# Patient Record
Sex: Female | Born: 1975 | Hispanic: Yes | Marital: Single | Smoking: Never smoker
Health system: Southern US, Community
[De-identification: ages and names within clinical notes are randomized; demographics above are authoritative.]

## PROBLEM LIST (undated history)

## (undated) DIAGNOSIS — N750 Cyst of Bartholin's gland: Secondary | ICD-10-CM

## (undated) DIAGNOSIS — Z789 Other specified health status: Secondary | ICD-10-CM

## (undated) HISTORY — PX: WISDOM TOOTH EXTRACTION: SHX21

## (undated) HISTORY — PX: NO PAST SURGERIES: SHX2092

## (undated) HISTORY — DX: Cyst of Bartholin's gland: N75.0

---

## 2017-12-30 ENCOUNTER — Emergency Department (HOSPITAL_COMMUNITY)
Admission: EM | Admit: 2017-12-30 | Discharge: 2017-12-30 | Disposition: A | Discharge status: Home / Self Care | Payer: Self-pay | Attending: Emergency Medicine | Admitting: Emergency Medicine

## 2017-12-30 ENCOUNTER — Encounter (HOSPITAL_COMMUNITY): Payer: Self-pay | Admitting: *Deleted

## 2017-12-30 ENCOUNTER — Other Ambulatory Visit: Payer: Self-pay

## 2017-12-30 DIAGNOSIS — N758 Other diseases of Bartholin's gland: Secondary | ICD-10-CM | POA: Insufficient documentation

## 2017-12-30 MED ORDER — SULFAMETHOXAZOLE-TRIMETHOPRIM 800-160 MG PO TABS: 1.0000 | ORAL_TABLET | Freq: Two times a day (BID) | ORAL | 0 refills | Status: AC

## 2017-12-30 NOTE — Discharge Instructions (Addendum)
Return if any problems.

## 2017-12-30 NOTE — ED Triage Notes (Signed)
Pt in c/o body aches and generalized not feeling well since Friday, pt thinks she has an abscess inside of her vagina that is causing her symptoms, reports nausea denies vomiting- triage completed via interpreter services

## 2017-12-30 NOTE — ED Provider Notes (Signed)
  [facility] EMERGENCY DEPARTMENT Provider Note   CSN: [id number] Arrival date & time: 12/30/17  0844     History   Chief Complaint Chief Complaint  Patient presents with  . Generalized Body Aches  . Vaginal Pain    HPI Jill [name] is a 42 y.o. female.  The history is provided by the patient. No language interpreter was used.  Vaginal Pain  This is a new problem. The current episode started yesterday. The problem occurs constantly. The problem has been gradually worsening. Nothing aggravates the symptoms. Nothing relieves the symptoms. She has tried nothing for the symptoms. The treatment provided no relief.  Pt complains of swelling to labia.  Pt reports she had a similar symptoms about a year ago that resolved.   History reviewed. No pertinent past medical history.  There are no active problems to display for this patient.   History reviewed. No pertinent surgical history.   OB History   None      Home Medications    Prior to Admission medications   Not on File    Family History History reviewed. No pertinent family history.  Social History Social History   Tobacco Use  . Smoking status: Never Smoker  . Smokeless tobacco: Never Used  Substance Use Topics  . Alcohol use: Not on file  . Drug use: Not on file     Allergies   Patient has no known allergies.   Review of Systems Review of Systems  Genitourinary: Positive for vaginal pain.  All other systems reviewed and are negative.    Physical Exam Updated Vital Signs BP 128/73 (BP Location: Right Arm)   Pulse 81   Temp 98.4 F (36.9 C) (Oral)   Resp 16   LMP 12/17/2017   SpO2 100%   Physical Exam  Constitutional: She appears well-developed and well-nourished.  HENT:  Head: Normocephalic.  Cardiovascular: Normal rate.  Pulmonary/Chest: Effort normal.  Genitourinary: No vaginal discharge found.  Genitourinary Comments: Swelling right labia,  No obvious  abscess.   Musculoskeletal: Normal range of motion.  Neurological: She is alert.  Skin: Skin is warm.  Psychiatric: She has a normal mood and affect.  Nursing note and vitals reviewed.    ED Treatments / Results  Labs (all labs ordered are listed, but only abnormal results are displayed) Labs Reviewed - No data to display  EKG None  Radiology No results found.  Procedures Procedures (including critical care time)  Medications Ordered in ED Medications - No data to display   Initial Impression / Assessment and Plan / ED Course  I have reviewed the triage vital signs and the nursing notes.  Pertinent labs & imaging results that were available during my care of the patient were reviewed by me and considered in my medical decision making (see chart for details).     MDM  Pt advised warm compresses.  Pt given rx for Bactrim DX.  Pt advised to recheck in 2 days.   Final Clinical Impressions(s) / ED Diagnoses   Final diagnoses:  Bartholin's gland infection    ED Discharge Orders        Ordered    sulfamethoxazole-trimethoprim (BACTRIM DS,SEPTRA DS) 800-160 MG tablet  2 times daily     12/30/17 0955    An After Visit Summary was printed and given to the patient.    Fransico Meadow, [place] 12/30/17 5364    Jola Schmidt, MD 12/31/17 [phone]

## 2017-12-31 ENCOUNTER — Encounter (HOSPITAL_COMMUNITY): Payer: Self-pay | Admitting: *Deleted

## 2017-12-31 ENCOUNTER — Inpatient Hospital Stay (HOSPITAL_COMMUNITY)
Admission: AD | Admit: 2017-12-31 | Discharge: 2017-12-31 | Disposition: A | Discharge status: Home / Self Care | Payer: Self-pay | Source: Ambulatory Visit | Attending: Family Medicine | Admitting: Family Medicine

## 2017-12-31 DIAGNOSIS — N758 Other diseases of Bartholin's gland: Secondary | ICD-10-CM | POA: Insufficient documentation

## 2017-12-31 DIAGNOSIS — N750 Cyst of Bartholin's gland: Secondary | ICD-10-CM | POA: Insufficient documentation

## 2017-12-31 LAB — POCT PREGNANCY, URINE: PREG TEST UR: NEGATIVE

## 2017-12-31 LAB — URINALYSIS, ROUTINE W REFLEX MICROSCOPIC
Bilirubin Urine: NEGATIVE
Glucose, UA: NEGATIVE mg/dL
Ketones, ur: NEGATIVE mg/dL
Nitrite: NEGATIVE
Protein, ur: NEGATIVE mg/dL
Specific Gravity, Urine: 1.015 (ref 1.005–1.030)
WBC, UA: >50 WBC/hpf — ABNORMAL HIGH (ref 0–5)
pH: 5 (ref 5.0–8.0)

## 2017-12-31 LAB — WET PREP, GENITAL
Sperm: NONE SEEN
Trich, Wet Prep: NONE SEEN
Yeast Wet Prep HPF POC: NONE SEEN

## 2017-12-31 MED ORDER — HYDROMORPHONE HCL 1 MG/ML IJ SOLN
1.0000 mg | Freq: Once | INTRAMUSCULAR | Status: AC
  Administered 2017-12-31: 1 mg via INTRAMUSCULAR
  Filled 2017-12-31: qty 1

## 2017-12-31 NOTE — MAU Provider Note (Signed)
Chief Complaint: Vaginal Pain; Fever; and Chills   First Provider Initiated Contact with Patient 12/31/17 2126     SUBJECTIVE HPI: Jill [name] is a 42 y.o. [phone] not currently pregnant who presents to maternity admissions reporting increased vaginal pain, fever and chills. She reports being seen yesterday at [facility] ED for symptoms and was diagnosed with a bartholin's cyst. She reports she was placed on Bactrim and pain got worse after starting medication. She reports symptoms of vaginal pain started on Friday. She reports that her right labia is swollen and it is difficult for her to walk. States fever is associated with symptoms, reports having a temp of 100 at home and took ibuprofen and temp went down.   Viria- spanish speaking interpreter at bedside for assessment and management.   History reviewed. No pertinent past medical history. History reviewed. No pertinent surgical history. Social History   Socioeconomic History  . Marital status: Single    Spouse name: Not on file  . Number of children: Not on file  . Years of education: Not on file  . Highest education level: Not on file  Occupational History  . Not on file  Social Needs  . Financial resource strain: Not on file  . Food insecurity:    Worry: Not on file    Inability: Not on file  . Transportation needs:    Medical: Not on file    Non-medical: Not on file  Tobacco Use  . Smoking status: Never Smoker  . Smokeless tobacco: Never Used  Substance and Sexual Activity  . Alcohol use: Never    Frequency: Never  . Drug use: Never  . Sexual activity: Yes    Birth control/protection: None  Lifestyle  . Physical activity:    Days per week: Not on file    Minutes per session: Not on file  . Stress: Not on file  Relationships  . Social connections:    Talks on phone: Not on file    Gets together: Not on file    Attends religious service: Not on file    Active member of club or organization: Not on file     Attends meetings of clubs or organizations: Not on file    Relationship status: Not on file  . Intimate partner violence:    Fear of current or ex partner: Not on file    Emotionally abused: Not on file    Physically abused: Not on file    Forced sexual activity: Not on file  Other Topics Concern  . Not on file  Social History Narrative  . Not on file   No current facility-administered medications on file prior to encounter.    Current Outpatient Medications on File Prior to Encounter  Medication Sig Dispense Refill  . sulfamethoxazole-trimethoprim (BACTRIM DS,SEPTRA DS) 800-160 MG tablet Take 1 tablet by mouth 2 (two) times daily for 7 days. 20 tablet 0   No Known Allergies  ROS:  Review of Systems  Constitutional: Positive for chills and fever. Negative for diaphoresis and fatigue.  Respiratory: Negative.   Cardiovascular: Negative.   Gastrointestinal: Negative.   Genitourinary: Positive for vaginal pain. Negative for difficulty urinating, dysuria, frequency, urgency, vaginal bleeding and vaginal discharge.   I have reviewed patient's Past Medical Hx, Surgical Hx, Family Hx, Social Hx, medications and allergies.   Physical Exam   Patient Vitals for the past 24 hrs:  BP Temp Temp src Pulse Resp SpO2  12/31/17 2309 (!) 97/51 98 F (  36.7 C) Oral 75 18 100 %  12/31/17 2022 (!) 116/58 99 F (37.2 C) - 93 16 -   Constitutional: Well-developed, well-nourished female. Cardiovascular: normal rate Respiratory: normal effort Neurologic: Alert and oriented x 4.   PELVIC EXAM: Enlarged abscess palpated in front of the hymenal ring, noted to be 3x4cm. Erythema noted around abscess. Abscess mobile and able to be drained.   LAB RESULTS Results for orders placed or performed during the hospital encounter of 12/31/17 (from the past 24 hour(s))  Pregnancy, urine POC     Status: None   Collection Time: 12/31/17  8:35 PM  Result Value Ref Range   Preg Test, Ur NEGATIVE NEGATIVE   Urinalysis, Routine w reflex microscopic     Status: Abnormal   Collection Time: 12/31/17  8:39 PM  Result Value Ref Range   Color, Urine YELLOW YELLOW   APPearance CLOUDY (A) CLEAR   Specific Gravity, Urine 1.015 1.005 - 1.030   pH 5.0 5.0 - 8.0   Glucose, UA NEGATIVE NEGATIVE mg/dL   Hgb urine dipstick MODERATE (A) NEGATIVE   Bilirubin Urine NEGATIVE NEGATIVE   Ketones, ur NEGATIVE NEGATIVE mg/dL   Protein, ur NEGATIVE NEGATIVE mg/dL   Nitrite NEGATIVE NEGATIVE   Leukocytes, UA LARGE (A) NEGATIVE   RBC / HPF 11-20 0 - 5 RBC/hpf   WBC, UA >50 (H) 0 - 5 WBC/hpf   Bacteria, UA RARE (A) NONE SEEN   Squamous Epithelial / LPF 21-50 0 - 5   Mucus PRESENT   Wet prep, genital     Status: Abnormal   Collection Time: 12/31/17  9:41 PM  Result Value Ref Range   Yeast Wet Prep HPF POC NONE SEEN NONE SEEN   Trich, Wet Prep NONE SEEN NONE SEEN   Clue Cells Wet Prep HPF POC PRESENT (A) NONE SEEN   WBC, Wet Prep HPF POC MODERATE (A) NONE SEEN   Sperm NONE SEEN     MAU Management/MDM: Orders Placed This Encounter  Procedures  . Wet prep, genital  . Urinalysis, Routine w reflex microscopic  . Pregnancy, urine POC  . Discharge patient Discharge disposition: 01-Home or Self Care; Discharge patient date: 12/31/2017   Meds ordered this encounter  Medications  . HYDROmorphone (DILAUDID) injection 1 mg   Consult Dr Nehemiah Settle for assessment and I&D.  PROCEDURE:  Bartholin Cyst I&D and Word Catheter Placement Enlarged abscess palpated in front of the hymenal ring around 5 or 7 o' clock. Discussed complications and possible outcomes of procedure including recurrence of cyst, scarring leading to infecton, bleeding, dyspareunia, distortion of anatomy.  Patient was examined in the dorsal lithotomy position and mass was identified.  The area was prepped with Iodine and draped in a sterile manner. 1% Lidocaine (3 ml) was then used to infiltrate area on top of the cyst, behind the hymenal ring.  A 7 mm  incision was made using a sterile scapel. Upon palpation of the mass, a moderate amount of bloody purulent drainage was expressed through the incision. A hemostat was used to break up loculations, which resulted in expression of more bloody purulent drainage. The open cyst was then copiously irrigated with normal saline, and a Word catheter was placed. 3 ml of sterile water was used to inflate the catheter balloon.  The end of the catheter was tucked into the vagina.  Patient tolerated the procedure well, reported feeling "a lot better." - Bactrim DS bid x 7 days for treatment - Recommended Sitz baths bid and  Motrin can be used prn for pain. She was told to call to be examined if she experiences increasing swelling, pain, vaginal discharge, or fever.   - She was instructed to wear a peripad to absorb discharge, and to maintain pelvic rest while the Word catheter is in place.  -The catheter will be left in place for at least two to four weeks to promote formation of an epithelialized tract for permanent drainage of glandular secretions.  - She will need an appointment [facility]6 weeks from now for removal of word catheter.   Pt discharged with strict instructions and precautions for management.   ASSESSMENT 1. Bartholin gland cyst   2. Bartholin's gland infection     PLAN Discharge home Follow up in office in 4 weeks for removal of word catheter  Continue Bactrim antibiotics  Pt stable at time of discharge  Motrin and Tylenol PRN as needed for pain management   Follow-up [facility] for [place]. Schedule an appointment as soon as possible for a visit in 3 week(s).   Specialty:  Obstetrics and Gynecology Why:  Follow up in 3 weeks  Contact information: [place] [phone]          Allergies as of 12/31/2017   No Known Allergies     Medication List    TAKE these medications    sulfamethoxazole-trimethoprim 800-160 MG tablet Commonly known as:  BACTRIM DS,SEPTRA DS Take 1 tablet by mouth 2 (two) times daily for 7 days.      Darrol Poke  Certified Nurse-Midwife 12/31/2017  11:04 PM

## 2017-12-31 NOTE — MAU Note (Signed)
Pt presents to MAU with complaints of an increase in vaginal pain. Pt was evaluated yesterday at [facility] ED and given antibiotic Bactrim. Rates pain 10.

## 2017-12-31 NOTE — Discharge Instructions (Signed)
Quiste o absceso de [place] (Bartholin Cyst or Abscess) Un quiste de Bartolino es un saco lleno de lquido que se forma en una glndula de [place]. Las glndulas de Bartolino son pequeas glndulas que se encuentran entre los pliegues de la piel (labios vaginales) a ambos lados del orificio inferior de la vagina. Este tipo de quiste forma un bulto al costado de la vagina. Un quiste que no es grande ni est infectado puede no causar problemas. Sin embargo, si el lquido del quiste se [place], este puede convertirse en un absceso. Un absceso puede causar [employer]. [place] los medicamentos solamente como se lo haya indicado el mdico.  Si le recetaron antibiticos, asegrese de terminarlos, incluso si comienza a sentirse mejor.  Pngase compresas hmedas tibias en la zona o hgase baos de asiento tibios que le cubran la zona plvica. Haga esto varias veces al da o como se lo haya indicado el mdico.  No apriete el quiste. No lo presione con fuerza.  No tenga relaciones sexuales hasta que el quiste haya desaparecido.  Si le abrieron el quiste o el absceso, tal vez le hayan colocado un pequeo trozo de gasa o un drenaje en la zona para permitir la salida de lquido del [place]. No se saque la gasa ni el drenaje hasta que el mdico se lo permita.  No use tampones. Use protectores femeninos segn sea necesario por la prdida de lquido o sangre.  Concurra a todas las visitas de control como se lo haya indicado el mdico. Esto es importante. SOLICITE AYUDA SI:  El dolor, la inflamacin (hinchazn) o el enrojecimiento en el rea del quiste empeoran.  Observa que sale lquido o pus del quiste.  Tiene fiebre. Esta informacin no tiene Marine scientist el consejo del mdico. Asegrese de hacerle al mdico cualquier pregunta que tenga. Document Released: 07/14/2010 Document Revised: 10/26/2014 Document Reviewed: 01/25/2014 Elsevier Interactive Patient Education   2018 [employer].

## 2018-01-18 ENCOUNTER — Encounter (HOSPITAL_COMMUNITY): Payer: Self-pay | Admitting: *Deleted

## 2018-01-18 ENCOUNTER — Inpatient Hospital Stay (HOSPITAL_COMMUNITY)
Admission: AD | Admit: 2018-01-18 | Discharge: 2018-01-18 | Disposition: A | Discharge status: Home / Self Care | Payer: Self-pay | Source: Ambulatory Visit | Attending: Obstetrics and Gynecology | Admitting: Obstetrics and Gynecology

## 2018-01-18 DIAGNOSIS — N898 Other specified noninflammatory disorders of vagina: Secondary | ICD-10-CM | POA: Insufficient documentation

## 2018-01-18 DIAGNOSIS — N751 Abscess of Bartholin's gland: Secondary | ICD-10-CM | POA: Insufficient documentation

## 2018-01-18 HISTORY — DX: Other specified health status: Z78.9

## 2018-01-18 NOTE — MAU Note (Signed)
Jill [name] is a 42 y.o. at Unknown here in MAU reporting:  Patient endorses being here 2-3 weeks ago for a vaginal boil that was drained/"removed" States something has fell out of the site and she is concerned, which Is what brought her in Denies pain; just some burning in the area Vitals:   01/18/18 1854  BP: 115/65  Pulse: 73  Resp: 17  Temp: 97.6 F (36.4 C)  SpO2: 100%      Lab orders placed from triage: none

## 2018-01-18 NOTE — MAU Note (Signed)
Urine in lab 

## 2018-01-18 NOTE — MAU Provider Note (Signed)
History  42 year old female in today with complaints of vaginal irritation from [facility] catheter placement from Bartholin's abscess that was done on July 9.  Patient requesting that Word catheter be removed today if possible.  Also requesting Pap smear because she has not had one done in years.  CSN: [id number]  Arrival date & time 01/18/18  1817   None     Chief Complaint  Patient presents with  . boil    HPI  Past Medical History:  Diagnosis Date  . Medical history non-contributory     Past Surgical History:  Procedure Laterality Date  . NO PAST SURGERIES      No family history on file.  Social History   Tobacco Use  . Smoking status: Never Smoker  . Smokeless tobacco: Never Used  Substance Use Topics  . Alcohol use: Never    Frequency: Never  . Drug use: Never    OB History    Gravida  3   Para  3   Term  3   Preterm      AB      Living  3     SAB      TAB      Ectopic      Multiple      Live Births              Review of Systems  Constitutional: Negative.   HENT: Negative.   Eyes: Negative.   Respiratory: Negative.   Cardiovascular: Negative.   Endocrine: Negative.   Genitourinary: Positive for vaginal pain.  Skin: Negative.   Allergic/Immunologic: Negative.   Neurological: Negative.   Hematological: Negative.   Psychiatric/Behavioral: Negative.     Allergies  Patient has no known allergies.  Home Medications    BP 115/65 (BP Location: Right Arm)   Pulse 73   Temp 97.6 F (36.4 C) (Oral)   Resp 17   Wt 146 lb (66.2 kg)   SpO2 100% Comment: ra  Physical Exam  Constitutional: She is oriented to person, place, and time. She appears well-developed and well-nourished.  HENT:  Head: Normocephalic.  Neck: Normal range of motion.  Cardiovascular: Normal rate, regular rhythm, normal heart sounds and intact distal pulses.  Pulmonary/Chest: Effort normal and breath sounds normal.  Abdominal: Soft. Bowel sounds are normal.   Genitourinary:  Genitourinary Comments: Area around Word catheter well-healed  Musculoskeletal: Normal range of motion.  Neurological: She is alert and oriented to person, place, and time.  Skin: Skin is warm and dry.  Psychiatric: She has a normal mood and affect. Her behavior is normal. Judgment and thought content normal.    MAU Course  Procedures (including critical care time)  Labs Reviewed - No data to display No results found.   1. Bartholin's gland abscess       MDM  Vital signs are stable.  Area around [facility] catheter well-healed will remove at patient request also message sent to [facility] told that patient needs Pap smear done with her visit on August.  August 5.  Will discharge home.

## 2018-01-27 ENCOUNTER — Ambulatory Visit (INDEPENDENT_AMBULATORY_CARE_PROVIDER_SITE_OTHER): Payer: Self-pay | Admitting: Family Medicine

## 2018-01-27 ENCOUNTER — Encounter: Payer: Self-pay | Admitting: Family Medicine

## 2018-01-27 VITALS — BP 98/50 | HR 69 | Ht 58.27 in | Wt 146.3 lb

## 2018-01-27 DIAGNOSIS — N750 Cyst of Bartholin's gland: Secondary | ICD-10-CM

## 2018-01-27 NOTE — Patient Instructions (Addendum)
You can schedule a free pap smear by calling [phone] to schedule an appointment at the next free pap smear clinic.   Puede ir al [employer] en [address] para obtener contraconceptivo.

## 2018-01-27 NOTE — Progress Notes (Signed)
   Subjective:    Patient ID: Jill [name], female    DOB: 03/25/1976, 42 y.o.   MRN: [id number]  HPI Patient seen for follow up of bartholin cyst on right side. She had ward catheter placed on 7/9. Removed 7/27. No pain, drainage.    Review of Systems     Objective:   Physical Exam  Constitutional: She appears well-developed and well-nourished.  Cardiovascular: Normal rate.  Pulmonary/Chest: Effort normal.  Abdominal: Soft.  Genitourinary: There is no rash, tenderness, lesion or injury on the right labia. There is no rash, tenderness, lesion or injury on the left labia.      Assessment & Plan:  1. Bartholin's cyst Well healed. No problems Health department for contraception as patient is self pay.  Pt referred to Jill [name] program for [name].

## 2018-06-25 NOTE — L&D Delivery Note (Signed)
Delivery Note At 3:50 PM a viable and healthy female was delivered via Vaginal, Spontaneous (Presentation: LOA ).  APGAR: 9, 9; weight pending.   Placenta status: spontaneous, intact .  Cord: 3 vessel with the following complications: none.    Anesthesia:  IV pain meds Episiotomy: None Lacerations:   None intact Suture Repair: n/a Est. Blood Loss (mL): 250  Mom to postpartum.  Baby to Couplet care / Skin to Skin.  Jill [name] is a 43 y.o. female [phone] with IUP at [redacted]w[redacted]d admitted for SOL .  She progressed with AROM to complete and pushed less than 10 minutes to deliver.  Cord clamping delayed by several minutes then clamped by CNM and cut by CNM.  Placenta intact and spontaneous, bleeding minimal.  Intact perineum  Mom and baby stable prior to transfer to postpartum. She plans on breastfeeding.    [name] 03/15/2019, 4:59 PM

## 2018-10-03 ENCOUNTER — Encounter (HOSPITAL_COMMUNITY): Payer: Self-pay | Admitting: *Deleted

## 2018-10-06 ENCOUNTER — Ambulatory Visit (HOSPITAL_COMMUNITY): Payer: Self-pay

## 2018-10-08 ENCOUNTER — Other Ambulatory Visit (HOSPITAL_COMMUNITY): Payer: Self-pay

## 2018-10-08 ENCOUNTER — Ambulatory Visit (HOSPITAL_COMMUNITY): Payer: Self-pay

## 2018-10-10 ENCOUNTER — Ambulatory Visit (HOSPITAL_COMMUNITY): Payer: Self-pay | Admitting: Obstetrics and Gynecology

## 2018-10-10 ENCOUNTER — Ambulatory Visit (HOSPITAL_COMMUNITY): Payer: Self-pay | Attending: Obstetrics and Gynecology

## 2018-10-10 ENCOUNTER — Ambulatory Visit (HOSPITAL_COMMUNITY): Payer: Self-pay | Admitting: Nurse Practitioner

## 2018-10-10 ENCOUNTER — Other Ambulatory Visit: Payer: Self-pay

## 2018-10-10 ENCOUNTER — Ambulatory Visit (HOSPITAL_COMMUNITY): Payer: Self-pay

## 2018-10-10 DIAGNOSIS — Z3A18 18 weeks gestation of pregnancy: Secondary | ICD-10-CM | POA: Insufficient documentation

## 2018-10-10 DIAGNOSIS — Z1371 Encounter for nonprocreative screening for genetic disease carrier status: Secondary | ICD-10-CM | POA: Insufficient documentation

## 2018-10-10 DIAGNOSIS — O09522 Supervision of elderly multigravida, second trimester: Secondary | ICD-10-CM | POA: Insufficient documentation

## 2018-10-10 NOTE — Progress Notes (Signed)
Error

## 2018-10-20 ENCOUNTER — Ambulatory Visit (HOSPITAL_COMMUNITY): Payer: Self-pay | Admitting: Nurse Practitioner

## 2018-10-23 LAB — HEMOGLOBINOPATHY EVALUATION
HGB C: 0 %
HGB S: 0 %
HGB VARIANT: 0 %
Hemoglobin A2 Quantitation: 2.1 % (ref 1.8–3.2)
Hemoglobin F Quantitation: 0.9 % (ref 0.0–2.0)
Hgb A: 97 % (ref 96.4–98.8)

## 2018-10-23 LAB — AFP, SERUM, OPEN SPINA BIFIDA
AFP MoM: 0.74
AFP Value: 33.1 ng/mL
Gest. Age on Collection Date: 18.2 weeks
Maternal Age At EDD: 41.7 yr
OSBR Risk 1 IN: 10000
Test Results:: NEGATIVE
Weight: 146 [lb_av]

## 2018-10-23 LAB — INHERITEST CORE(CF97,SMA,FRAX)

## 2018-10-23 LAB — MATERNIT 21 PLUS CORE, BLOOD
Fetal Fraction: 7
Result (T21): NEGATIVE
Trisomy 13 (Patau syndrome): NEGATIVE
Trisomy 18 (Edwards syndrome): NEGATIVE
Trisomy 21 (Down syndrome): NEGATIVE

## 2019-03-15 ENCOUNTER — Inpatient Hospital Stay (HOSPITAL_COMMUNITY)
Admission: AD | Admit: 2019-03-15 | Discharge: 2019-03-16 | DRG: 807 | Disposition: A | Discharge status: Home / Self Care | Payer: Medicaid Other | Attending: Obstetrics and Gynecology | Admitting: Obstetrics and Gynecology

## 2019-03-15 ENCOUNTER — Encounter (HOSPITAL_COMMUNITY): Payer: Self-pay

## 2019-03-15 ENCOUNTER — Other Ambulatory Visit: Payer: Self-pay

## 2019-03-15 DIAGNOSIS — O26893 Other specified pregnancy related conditions, third trimester: Secondary | ICD-10-CM | POA: Diagnosis present

## 2019-03-15 DIAGNOSIS — O480 Post-term pregnancy: Secondary | ICD-10-CM | POA: Diagnosis not present

## 2019-03-15 DIAGNOSIS — Z3A40 40 weeks gestation of pregnancy: Secondary | ICD-10-CM

## 2019-03-15 DIAGNOSIS — Z20828 Contact with and (suspected) exposure to other viral communicable diseases: Secondary | ICD-10-CM | POA: Diagnosis present

## 2019-03-15 LAB — TYPE AND SCREEN
ABO/RH(D): A POS
Antibody Screen: NEGATIVE

## 2019-03-15 LAB — CBC
HCT: 39.1 % (ref 36.0–46.0)
Hemoglobin: 13.7 g/dL (ref 12.0–15.0)
MCH: 31.9 pg (ref 26.0–34.0)
MCHC: 35 g/dL (ref 30.0–36.0)
MCV: 91.1 fL (ref 80.0–100.0)
Platelets: 304 10*3/uL (ref 150–400)
RBC: 4.29 MIL/uL (ref 3.87–5.11)
RDW: 13.4 % (ref 11.5–15.5)
WBC: 6.3 10*3/uL (ref 4.0–10.5)
nRBC: 0 % (ref 0.0–0.2)

## 2019-03-15 LAB — SARS CORONAVIRUS 2 BY RT PCR (HOSPITAL ORDER, PERFORMED IN ~~LOC~~ HOSPITAL LAB): SARS Coronavirus 2: NEGATIVE

## 2019-03-15 LAB — ABO/RH: ABO/RH(D): A POS

## 2019-03-15 MED ORDER — LACTATED RINGERS IV SOLN: INTRAVENOUS | Status: DC

## 2019-03-15 MED ORDER — ACETAMINOPHEN 325 MG PO TABS
650.0000 mg | ORAL_TABLET | ORAL | Status: DC | PRN
  Administered 2019-03-16 (×2): 650 mg via ORAL
  Filled 2019-03-15 (×2): qty 2

## 2019-03-15 MED ORDER — LACTATED RINGERS IV SOLN: 500.0000 mL | INTRAVENOUS | Status: DC | PRN

## 2019-03-15 MED ORDER — WITCH HAZEL-GLYCERIN EX PADS: 1.0000 "application " | MEDICATED_PAD | CUTANEOUS | Status: DC | PRN

## 2019-03-15 MED ORDER — BENZOCAINE-MENTHOL 20-0.5 % EX AERO: 1.0000 "application " | INHALATION_SPRAY | CUTANEOUS | Status: DC | PRN

## 2019-03-15 MED ORDER — LIDOCAINE HCL (PF) 1 % IJ SOLN: 30.0000 mL | INTRAMUSCULAR | Status: DC | PRN

## 2019-03-15 MED ORDER — DIPHENHYDRAMINE HCL 25 MG PO CAPS: 25.0000 mg | ORAL_CAPSULE | Freq: Four times a day (QID) | ORAL | Status: DC | PRN

## 2019-03-15 MED ORDER — ONDANSETRON HCL 4 MG/2ML IJ SOLN: 4.0000 mg | INTRAMUSCULAR | Status: DC | PRN

## 2019-03-15 MED ORDER — FLEET ENEMA 7-19 GM/118ML RE ENEM: 1.0000 | ENEMA | RECTAL | Status: DC | PRN

## 2019-03-15 MED ORDER — ACETAMINOPHEN 325 MG PO TABS: 650.0000 mg | ORAL_TABLET | ORAL | Status: DC | PRN

## 2019-03-15 MED ORDER — FENTANYL CITRATE (PF) 100 MCG/2ML IJ SOLN
100.0000 ug | INTRAMUSCULAR | Status: DC | PRN
  Administered 2019-03-15: 100 ug via INTRAVENOUS

## 2019-03-15 MED ORDER — COCONUT OIL OIL: 1.0000 "application " | TOPICAL_OIL | Status: DC | PRN

## 2019-03-15 MED ORDER — OXYTOCIN BOLUS FROM INFUSION
500.0000 mL | Freq: Once | INTRAVENOUS | Status: AC
  Administered 2019-03-15: 500 mL via INTRAVENOUS

## 2019-03-15 MED ORDER — SENNOSIDES-DOCUSATE SODIUM 8.6-50 MG PO TABS
2.0000 | ORAL_TABLET | ORAL | Status: DC
  Filled 2019-03-15: qty 2

## 2019-03-15 MED ORDER — SIMETHICONE 80 MG PO CHEW: 80.0000 mg | CHEWABLE_TABLET | ORAL | Status: DC | PRN

## 2019-03-15 MED ORDER — OXYTOCIN 40 UNITS IN NORMAL SALINE INFUSION - SIMPLE MED
2.5000 [IU]/h | INTRAVENOUS | Status: DC
  Administered 2019-03-15: 16:00:00 2.5 [IU]/h via INTRAVENOUS
  Filled 2019-03-15: qty 1000

## 2019-03-15 MED ORDER — IBUPROFEN 600 MG PO TABS
600.0000 mg | ORAL_TABLET | Freq: Four times a day (QID) | ORAL | Status: DC
  Administered 2019-03-16 (×3): 600 mg via ORAL
  Filled 2019-03-15 (×4): qty 1

## 2019-03-15 MED ORDER — OXYCODONE-ACETAMINOPHEN 5-325 MG PO TABS: 1.0000 | ORAL_TABLET | ORAL | Status: DC | PRN

## 2019-03-15 MED ORDER — TETANUS-DIPHTH-ACELL PERTUSSIS 5-2.5-18.5 LF-MCG/0.5 IM SUSP: 0.5000 mL | Freq: Once | INTRAMUSCULAR | Status: DC

## 2019-03-15 MED ORDER — ZOLPIDEM TARTRATE 5 MG PO TABS: 5.0000 mg | ORAL_TABLET | Freq: Every evening | ORAL | Status: DC | PRN

## 2019-03-15 MED ORDER — ONDANSETRON HCL 4 MG/2ML IJ SOLN: 4.0000 mg | Freq: Four times a day (QID) | INTRAMUSCULAR | Status: DC | PRN

## 2019-03-15 MED ORDER — OXYCODONE-ACETAMINOPHEN 5-325 MG PO TABS: 2.0000 | ORAL_TABLET | ORAL | Status: DC | PRN

## 2019-03-15 MED ORDER — ONDANSETRON HCL 4 MG PO TABS: 4.0000 mg | ORAL_TABLET | ORAL | Status: DC | PRN

## 2019-03-15 MED ORDER — FENTANYL CITRATE (PF) 100 MCG/2ML IJ SOLN
INTRAMUSCULAR | Status: AC
  Filled 2019-03-15: qty 2

## 2019-03-15 MED ORDER — DIBUCAINE (PERIANAL) 1 % EX OINT: 1.0000 "application " | TOPICAL_OINTMENT | CUTANEOUS | Status: DC | PRN

## 2019-03-15 MED ORDER — SOD CITRATE-CITRIC ACID 500-334 MG/5ML PO SOLN: 30.0000 mL | ORAL | Status: DC | PRN

## 2019-03-15 MED ORDER — PRENATAL MULTIVITAMIN CH
1.0000 | ORAL_TABLET | Freq: Every day | ORAL | Status: DC
  Administered 2019-03-16: 1 via ORAL
  Filled 2019-03-15: qty 1

## 2019-03-15 NOTE — H&P (Signed)
Jill [name] is a 43 y.o. female [phone] at [redacted]w[redacted]d low risk OB pt of [facility] is admitted for spontaneous onset of labor.  Ob history significant for NSVD x 3.  She reports normal fetal movement.   OB History    Gravida  4   Para  3   Term  3   Preterm      AB      Living  3     SAB      TAB      Ectopic      Multiple      Live Births             Past Medical History:  Diagnosis Date  . Medical history non-contributory    Past Surgical History:  Procedure Laterality Date  . NO PAST SURGERIES    . WISDOM TOOTH EXTRACTION     Family History: family history includes Diabetes in her maternal grandmother; Hypertension in her mother. Social History:  reports that she has never smoked. She has never used smokeless tobacco. She reports that she does not drink alcohol or use drugs.     Maternal Diabetes: No Genetic Screening: Normal Maternal Ultrasounds/Referrals: Normal Fetal Ultrasounds or other Referrals:  None Maternal Substance Abuse:  No Significant Maternal Medications:  None Significant Maternal Lab Results:  None and Group B Strep negative Other Comments:  None  Review of Systems  Constitutional: Negative for chills, fever and malaise/fatigue.  Eyes: Negative for blurred vision.  Respiratory: Negative for cough and shortness of breath.   Cardiovascular: Negative for chest pain.  Gastrointestinal: Positive for abdominal pain. Negative for heartburn and vomiting.  Genitourinary: Negative for dysuria, frequency and urgency.  Musculoskeletal: Negative.   Neurological: Negative for dizziness and headaches.  Psychiatric/Behavioral: Negative for depression.   Maternal Medical History:  Reason for admission: Contractions.   Contractions: Onset was 3-5 hours ago.   Frequency: regular.   Perceived severity is moderate.    Fetal activity: Perceived fetal activity is normal.   Last perceived fetal movement was within the past hour.    Prenatal  complications: no prenatal complications Prenatal Complications - Diabetes: none.    Dilation: 6 Effacement (%): 80 Station: -1 Blood pressure 96/79, pulse 80, temperature 99.1 F (37.3 C), temperature source Oral, resp. rate 20, last menstrual period 06/04/2018, SpO2 99 %. Maternal Exam:  Uterine Assessment: Contraction strength is mild.  Contraction frequency is irregular.   Abdomen: Fetal presentation: vertex  Cervix: Cervix evaluated by digital exam.     Fetal Exam Fetal Monitor Review: Mode: ultrasound.   Baseline rate: 135.  Variability: moderate (6-25 bpm).   Pattern: accelerations present and no decelerations.    Fetal State Assessment: Category I - tracings are normal.     Physical Exam  Nursing note and vitals reviewed. Constitutional: She is oriented to person, place, and time. She appears well-developed and well-nourished.  Neck: Normal range of motion.  Cardiovascular: Normal rate, regular rhythm and normal heart sounds.  Respiratory: Effort normal and breath sounds normal.  GI: Soft.  Musculoskeletal: Normal range of motion.  Neurological: She is alert and oriented to person, place, and time.  Skin: Skin is warm and dry.  Psychiatric: She has a normal mood and affect. Her behavior is normal. Judgment and thought content normal.    Prenatal labs: ABO, Rh:  A positive Antibody:  neg Rubella:  immune RPR:   neg HBsAg:   neg HIV:   nonreactive GBS:  negative per pt  Assessment/Plan: G4P3003 at [redacted]w[redacted]d SOL GBS neg Admit to L&D Anticipate NSVD   [name] 03/15/2019, 2:57 PM

## 2019-03-15 NOTE — Discharge Summary (Signed)
Postpartum Discharge Summary    Patient Name: Jill [name] DOB: 07-Feb-1976 MRN: [id number]  Date of admission: 03/15/2019 Delivering Provider: [name] A   Date of discharge: 03/16/2019  Admitting diagnosis: CXT less than 7 minutes Intrauterine pregnancy: [redacted]w[redacted]d    Secondary diagnosis:  Active Problems:   Normal labor   [redacted] weeks gestation of pregnancy  Additional problems: None     Discharge diagnosis: Term Pregnancy Delivered                                                                                                Post partum procedures:None  Augmentation: AROM  Complications: None  Hospital course:  Onset of Labor With Vaginal Delivery     43y.o. yo [id number] admitted in Active Labor on 03/15/2019. Patient had an uncomplicated labor course as follows:  Membrane Rupture Time/Date: 3:45 PM ,03/15/2019   Intrapartum Procedures: Episiotomy: None [1]                                         Lacerations:    Patient had a delivery of a Viable infant. 03/15/2019  Information for the patient's newborn:  MoVieno, [place]0[[id number]]Delivery Method: Vaginal, Spontaneous(Filed from Delivery Summary)     Pateint had an uncomplicated postpartum course.  She is ambulating, tolerating a regular diet, passing flatus, and urinating well. Depo given prior to discharge and patient would like to f/u at her clinic for IUD. Patient is discharged home in stable condition on 03/16/19.  Delivery time: 3:50 PM    Magnesium Sulfate received: No BMZ received: No Rhophylac:No MMR:No Transfusion:No  Physical exam  Vitals:   03/15/19 1853 03/15/19 2258 03/16/19 0301 03/16/19 0815  BP: 116/66 119/79 100/62 107/69  Pulse: 75 71 80 74  Resp: '18 18 18 18  ' Temp: 99.4 F (37.4 C) 99.4 F (37.4 C) 99.5 F (37.5 C) 99.3 F (37.4 C)  TempSrc: Oral Oral Oral Axillary  SpO2: 100% 100% 98%   Weight:      Height:       General: alert, cooperative  and no distress Lochia: appropriate Uterine Fundus: firm Incision: Healing well with no significant drainage, No significant erythema, Dressing is clean, dry, and intact DVT Evaluation: No evidence of DVT seen on physical exam. Labs: Lab Results  Component Value Date   WBC 6.3 03/15/2019   HGB 13.7 03/15/2019   HCT 39.1 03/15/2019   MCV 91.1 03/15/2019   PLT 304 03/15/2019   No flowsheet data found.  Discharge instruction: per After Visit Summary and "Baby and Me Booklet".  After visit meds:  Allergies as of 03/16/2019   No Known Allergies     Medication List    TAKE these medications   ibuprofen 200 MG tablet Commonly known as: ADVIL Take 200 mg by mouth every 6 (six) hours as needed.   PrePLUS 27-1 MG Tabs Take 1 tablet by mouth daily.  Diet: routine diet  Activity: Advance as tolerated. Pelvic rest for 6 weeks.   Outpatient follow up:6 weeks Follow up Appt:No future appointments. Follow up Visit:  Pt of [facility], no PP message sent.  Please schedule this patient for Postpartum visit in: 6 weeks with the following provider: Any provider For C/S patients schedule nurse incision check in weeks 2 weeks: no Low risk pregnancy complicated by: n/a Delivery mode:  SVD Anticipated Birth Control: Depo then IUD outpatient  PP Procedures needed: none  Schedule Integrated BH visit: no   Newborn Data: Live born female  Birth Weight: 7 lb 1.8 oz (3225 g) APGAR: 9, 9  Newborn Delivery   Birth date/time: 03/15/2019 15:50:00 Delivery type: Vaginal, Spontaneous      Baby Feeding: Breast Disposition:home with mother   03/16/2019 Chauncey Mann, MD

## 2019-03-15 NOTE — Lactation Note (Signed)
This note was copied from a baby's chart. Lactation Consultation Note  Patient Name: Jill [name] [id number] Date: 03/15/2019 Reason for consult: Initial assessment;Term  6 hours old FT female who is being exclusively BF by his mother, she's a P4 and experienced BF. She was able to BF her first baby for 3 years, the second and third for 2 years. She participated in the [facility] program at the [facility] and she's already familiar with hand expression.   When LC revised hand expression with mom, she was able to get small droplets of colostrum out of both breast, but they're tiny, mom concerned about her supply. Reassured her that this is normal and that her milk will come in again just like it did with her other babies; she exclusively BF them.  Offered assistance with latch but mom politely declined, baby just fed. Asked mom to call for assistance when needed. Reviewed normal newborn behavior, lactogenesis II, feeding cues and cluster feeding.   Feeding plan:  1. Encouraged mom to feed baby STS 8-12 times/24 hours or sooner if feeding cues are present 2. Hand expression and spoon feeding were also encouraged  BF brochure (SP), BF resources (SP) and feeding diary (SP) were reviewed. Parents reported all questions and concerns were answered, they're both aware of [facility] OP services and will call PRN.  Maternal Data Formula Feeding for Exclusion: Yes Reason for exclusion: Mother's choice to formula and breast feed on admission Has patient been taught Hand Expression?: Yes Does the patient have breastfeeding experience prior to this delivery?: Yes  Feeding Feeding Type: Breast Fed   Interventions Interventions: Breast feeding basics reviewed;Breast massage;Hand express;Breast compression  Lactation Tools Discussed/Used WIC Program: Yes   Consult Status Consult Status: Follow-up Date: 03/16/19 Follow-up type: In-patient    [place] 03/15/2019, 10:00 PM

## 2019-03-15 NOTE — MAU Note (Signed)
PT reports to mau with c/o ctx since 0900.  Pt denies LOF and reports good fetal movement.

## 2019-03-16 DIAGNOSIS — Z3A40 40 weeks gestation of pregnancy: Secondary | ICD-10-CM

## 2019-03-16 LAB — RPR: RPR Ser Ql: NONREACTIVE

## 2019-03-16 MED ORDER — PREPLUS 27-1 MG PO TABS: 1.0000 | ORAL_TABLET | Freq: Every day | ORAL | 13 refills | Status: DC

## 2019-03-16 MED ORDER — MEDROXYPROGESTERONE ACETATE 150 MG/ML IM SUSP
150.0000 mg | Freq: Once | INTRAMUSCULAR | Status: AC
  Administered 2019-03-16: 150 mg via INTRAMUSCULAR
  Filled 2019-03-16: qty 1

## 2019-03-16 NOTE — Lactation Note (Signed)
This note was copied from a baby's chart. Lactation Consultation Note  Patient Name: Jill [name] [id number] Date: 03/16/2019 Reason for consult: Follow-up assessment;Term;Infant weight loss  23 hours old FT female who is still being exclusively BF by his mother, she's a P4 and experienced BF. Mother's feeding choice on admission was to do both, breast and formula, she requested formula today, let her know that her RN could bring it to the room prior her discharge, mom and baby are going home today, baby is at 3% weight loss.  Assure to mom that she most likely have enough "milk" for her baby, LC offered a hand pump, instructions, cleaning and storage were reviewed as well as milk storage guidelines. Instructed mom to use her hand pump at home after feedings if she wanted to give baby anything "extra" before using any formula.  Per mom BF is going well and feedings at the breast are comfortable so far. Reviewed discharge instructions, red flags on when to call baby's pediatrician, engorgement prevention/ treatment and treatment/prevention for sore nipples. Parents reported all questions and concerns were answered, they're both aware of [facility] OP services and will call PRN.  Maternal Data    Feeding    Interventions Interventions: Breast feeding basics reviewed;Hand pump  Lactation Tools Discussed/Used Tools: Pump Breast pump type: Manual Pump Review: Setup, frequency, and cleaning;Milk Storage Initiated by:: MPeck Date initiated:: 03/16/19   Consult Status Consult Status: Complete Date: 03/16/19 Follow-up type: In-patient    [name] Francene Boyers 03/16/2019, 3:20 PM

## 2019-03-16 NOTE — Progress Notes (Signed)
[employer] interpreter is used for maternal and infant discharge instructions. Ms Rock Nephew states understanding information.

## 2019-12-14 ENCOUNTER — Encounter: Payer: Self-pay | Admitting: Nurse Practitioner

## 2019-12-14 ENCOUNTER — Ambulatory Visit: Payer: Self-pay | Attending: Nurse Practitioner | Admitting: Nurse Practitioner

## 2019-12-14 ENCOUNTER — Other Ambulatory Visit: Payer: Self-pay

## 2019-12-14 VITALS — Ht <= 58 in | Wt 175.0 lb

## 2019-12-14 DIAGNOSIS — F419 Anxiety disorder, unspecified: Secondary | ICD-10-CM

## 2019-12-14 DIAGNOSIS — Z1329 Encounter for screening for other suspected endocrine disorder: Secondary | ICD-10-CM

## 2019-12-14 DIAGNOSIS — Z13220 Encounter for screening for lipoid disorders: Secondary | ICD-10-CM

## 2019-12-14 DIAGNOSIS — K649 Unspecified hemorrhoids: Secondary | ICD-10-CM

## 2019-12-14 DIAGNOSIS — Z130 Encounter for screening for diseases of the blood and blood-forming organs and certain disorders involving the immune mechanism: Secondary | ICD-10-CM

## 2019-12-14 DIAGNOSIS — Z131 Encounter for screening for diabetes mellitus: Secondary | ICD-10-CM

## 2019-12-14 DIAGNOSIS — Z7689 Persons encountering health services in other specified circumstances: Secondary | ICD-10-CM

## 2019-12-14 DIAGNOSIS — Z13228 Encounter for screening for other metabolic disorders: Secondary | ICD-10-CM

## 2019-12-14 MED ORDER — HYDROCORTISONE ACETATE 25 MG RE SUPP: 25.0000 mg | Freq: Two times a day (BID) | RECTAL | 1 refills | Status: DC

## 2019-12-14 MED ORDER — HYDROXYZINE HCL 10 MG PO TABS: 10.0000 mg | ORAL_TABLET | Freq: Three times a day (TID) | ORAL | 1 refills | Status: DC | PRN

## 2019-12-14 MED FILL — hydrOXYzine HCL 10 MG TABS: 10 | 20 days supply | Qty: 60 | Fill #0

## 2019-12-14 MED FILL — HYDROCORTISONE ACETATE 25 M: 25 | 12 days supply | Qty: 24 | Fill #0

## 2019-12-14 NOTE — Progress Notes (Signed)
Virtual Visit via Telephone Note Due to national recommendations of social distancing due to [place] 19, telehealth visit is felt to be most appropriate for this patient at this time.  I discussed the limitations, risks, security and privacy concerns of performing an evaluation and management service by telephone and the availability of in person appointments. I also discussed with the patient that there may be a patient responsible charge related to this service. The patient expressed understanding and agreed to proceed.    I connected with [place] on 12/14/19  at   3:10 PM EDT  EDT by telephone and verified that I am speaking with the correct person using two identifiers.   Consent I discussed the limitations, risks, security and privacy concerns of performing an evaluation and management service by telephone and the availability of in person appointments. I also discussed with the patient that there may be a patient responsible charge related to this service. The patient expressed understanding and agreed to proceed.   Location of Patient: Private Residence    Location of Provider: [place] and [employer] Office    Persons participating in Telemedicine visit: Jill Rankins FNP-BC Jill [name]     History of Present Illness: Telemedicine visit for: Establish Care  Patient has been counseled on age-appropriate routine health concerns for screening and prevention. These are reviewed and up-to-date. Referrals have been placed accordingly. Immunizations are up-to-date, scheduled or declined.     Depression screen [facility] 2/9 12/14/2019 01/27/2018  Decreased Interest 3 0  Down, Depressed, Hopeless 3 0  PHQ - 2 Score 6 0  Altered sleeping 0 0  Tired, decreased energy 0 0  Change in appetite 0 0  Feeling bad or failure about yourself  0 0  Trouble concentrating 0 0  Moving slowly or fidgety/restless 0 0  Suicidal  thoughts 0 0  PHQ-9 Score 6 0   GAD 7 : Generalized Anxiety Score 12/14/2019 01/27/2018  Nervous, Anxious, on Edge 3 0  Control/stop worrying 3 0  Worry too much - different things 3 0  Trouble relaxing 0 0  Restless 0 0  Easily annoyed or irritable 0 0  Afraid - awful might happen 0 0  Total GAD 7 Score 9 0   Rectal Pain Describes symptoms of hemorrhoids. Onset 6 months into her pregnancy. Describes burning and pain with sitting . Feels pain inside the rectum. She did have a a vaginal birth in September 2020. Endorses normal bowel movements. Denies rectal bleeding, history of colon cancer or IBS.      Past Medical History:  Diagnosis Date  . Infected cyst of Bartholin's gland duct   . Medical history non-contributory     Past Surgical History:  Procedure Laterality Date  . NO PAST SURGERIES    . WISDOM TOOTH EXTRACTION      Family History  Problem Relation Age of Onset  . Diabetes Maternal Grandmother   . Hypertension Mother     Social History   Socioeconomic History  . Marital status: Single    Spouse name: Not on file  . Number of children: Not on file  . Years of education: Not on file  . Highest education level: Not on file  Occupational History  . Not on file  Tobacco Use  . Smoking status: Never Smoker  . Smokeless tobacco: Never Used  Substance and Sexual Activity  . Alcohol use: Never  . Drug use: Never  .  Sexual activity: Yes    Birth control/protection: I.U.D.  Other Topics Concern  . Not on file  Social History Narrative  . Not on file   Social Determinants of Health   Financial Resource Strain:   . Difficulty of Paying Living Expenses:   Food Insecurity:   . Worried About Charity fundraiser in the Last Year:   . Arboriculturist in the Last Year:   Transportation Needs:   . Film/video editor (Medical):   Marland Kitchen Lack of Transportation (Non-Medical):   Physical Activity:   . Days of Exercise per Week:   . Minutes of Exercise per Session:    Stress:   . Feeling of Stress :   Social Connections:   . Frequency of Communication with Friends and Family:   . Frequency of Social Gatherings with Friends and Family:   . Attends Religious Services:   . Active Member of Clubs or Organizations:   . Attends Archivist Meetings:   Marland Kitchen Marital Status:      Observations/Objective: Awake, alert and oriented x 3   Review of Systems  Constitutional: Negative for fever, malaise/fatigue and weight loss.  HENT: Negative.  Negative for nosebleeds.   Eyes: Negative.  Negative for blurred vision, double vision and photophobia.  Respiratory: Negative.  Negative for cough and shortness of breath.   Cardiovascular: Negative.  Negative for chest pain, palpitations and leg swelling.  Gastrointestinal: Negative for abdominal pain, blood in stool, constipation, diarrhea, heartburn, melena, nausea and vomiting.       Rectal pain  Musculoskeletal: Negative.  Negative for myalgias.  Neurological: Negative.  Negative for dizziness, focal weakness, seizures and headaches.  Psychiatric/Behavioral: Negative.  Negative for suicidal ideas.    Assessment and Plan: Jill [name] was seen today for establish care.  Diagnoses and all orders for this visit:  Encounter to establish care  Screening for deficiency anemia -     CBC; Future  Lipid screening -     Lipid panel; Future  Thyroid disorder screening -     TSH; Future  Screening for metabolic disorder -     TMH96+QIWL; Future  Encounter for screening for diabetes mellitus -     Hemoglobin A1c; Future  Hemorrhoids, unspecified hemorrhoid type -     hydrocortisone (ANUSOL-HC) 25 MG suppository; Place 1 suppository (25 mg total) rectally 2 (two) times daily.  Anxiety -     hydrOXYzine (ATARAX/VISTARIL) 10 MG tablet; Take 1 tablet (10 mg total) by mouth 3 (three) times daily as needed.     Follow Up Instructions Return in about 3 weeks (around 01/04/2020) for hemorrhoids.     I  discussed the assessment and treatment plan with the patient. The patient was provided an opportunity to ask questions and all were answered. The patient agreed with the plan and demonstrated an understanding of the instructions.   The patient was advised to call back or seek an in-person evaluation if the symptoms worsen or if the condition fails to improve as anticipated.  I provided 17 minutes of non-face-to-face time during this encounter including median intraservice time, reviewing previous notes, labs, imaging, medications and explaining diagnosis and management.  Gildardo Pounds, FNP-BC

## 2019-12-15 NOTE — Addendum Note (Signed)
Addended bySteffanie Dunn on: 12/15/2019 03:39 PM   Modules accepted: Orders

## 2019-12-16 LAB — CMP14+EGFR
ALT: 22 IU/L (ref 0–32)
AST: 34 IU/L (ref 0–40)
Albumin/Globulin Ratio: 1.6 (ref 1.2–2.2)
Albumin: 4.5 g/dL (ref 3.8–4.8)
Alkaline Phosphatase: 80 IU/L (ref 48–121)
BUN/Creatinine Ratio: 18 (ref 9–23)
BUN: 10 mg/dL (ref 6–24)
Bilirubin Total: 0.3 mg/dL (ref 0.0–1.2)
CO2: 23 mmol/L (ref 20–29)
Calcium: 9.6 mg/dL (ref 8.7–10.2)
Chloride: 103 mmol/L (ref 96–106)
Creatinine, Ser: 0.57 mg/dL (ref 0.57–1.00)
GFR calc Af Amer: 132 mL/min/{1.73_m2} (ref >59)
GFR calc non Af Amer: 115 mL/min/{1.73_m2} (ref >59)
Globulin, Total: 2.8 g/dL (ref 1.5–4.5)
Glucose: 93 mg/dL (ref 65–99)
Potassium: 4.1 mmol/L (ref 3.5–5.2)
Sodium: 138 mmol/L (ref 134–144)
Total Protein: 7.3 g/dL (ref 6.0–8.5)

## 2019-12-16 LAB — CBC
Hematocrit: 42.4 % (ref 34.0–46.6)
Hemoglobin: 14.4 g/dL (ref 11.1–15.9)
MCH: 31 pg (ref 26.6–33.0)
MCHC: 34 g/dL (ref 31.5–35.7)
MCV: 91 fL (ref 79–97)
Platelets: 395 10*3/uL (ref 150–450)
RBC: 4.64 x10E6/uL (ref 3.77–5.28)
RDW: 12.5 % (ref 11.7–15.4)
WBC: 7.4 10*3/uL (ref 3.4–10.8)

## 2019-12-16 LAB — LIPID PANEL
Chol/HDL Ratio: 4 ratio (ref 0.0–4.4)
Cholesterol, Total: 194 mg/dL (ref 100–199)
HDL: 49 mg/dL (ref >39)
LDL Chol Calc (NIH): 117 mg/dL — ABNORMAL HIGH (ref 0–99)
Triglycerides: 158 mg/dL — ABNORMAL HIGH (ref 0–149)
VLDL Cholesterol Cal: 28 mg/dL (ref 5–40)

## 2019-12-16 LAB — HEMOGLOBIN A1C
Est. average glucose Bld gHb Est-mCnc: 100 mg/dL
Hgb A1c MFr Bld: 5.1 % (ref 4.8–5.6)

## 2019-12-16 LAB — TSH: TSH: 1.53 u[IU]/mL (ref 0.450–4.500)

## 2019-12-18 ENCOUNTER — Other Ambulatory Visit: Payer: Self-pay

## 2019-12-18 ENCOUNTER — Other Ambulatory Visit: Payer: Self-pay | Admitting: Obstetrics and Gynecology

## 2019-12-18 DIAGNOSIS — Z1231 Encounter for screening mammogram for malignant neoplasm of breast: Secondary | ICD-10-CM

## 2019-12-18 NOTE — Progress Notes (Signed)
Error

## 2019-12-24 ENCOUNTER — Other Ambulatory Visit: Payer: Self-pay

## 2020-01-19 ENCOUNTER — Ambulatory Visit: Payer: Self-pay | Attending: Nurse Practitioner | Admitting: Nurse Practitioner

## 2020-01-19 ENCOUNTER — Ambulatory Visit: Payer: Self-pay

## 2020-01-19 ENCOUNTER — Encounter: Payer: Self-pay | Admitting: Nurse Practitioner

## 2020-01-19 ENCOUNTER — Other Ambulatory Visit: Payer: Self-pay

## 2020-01-19 ENCOUNTER — Ambulatory Visit: Payer: Self-pay | Admitting: *Deleted

## 2020-01-19 VITALS — BP 108/78 | HR 70 | Temp 97.7°F | Ht 59.5 in | Wt 154.0 lb

## 2020-01-19 VITALS — BP 120/80 | Temp 98.2°F | Wt 154.5 lb

## 2020-01-19 DIAGNOSIS — N6310 Unspecified lump in the right breast, unspecified quadrant: Secondary | ICD-10-CM

## 2020-01-19 DIAGNOSIS — R198 Other specified symptoms and signs involving the digestive system and abdomen: Secondary | ICD-10-CM

## 2020-01-19 DIAGNOSIS — Z1239 Encounter for other screening for malignant neoplasm of breast: Secondary | ICD-10-CM

## 2020-01-19 DIAGNOSIS — K649 Unspecified hemorrhoids: Secondary | ICD-10-CM

## 2020-01-19 NOTE — Progress Notes (Signed)
Ms. Shaindy Reader de [place] Adjutant is a 44 y.o. female who presents to [facility] clinic today with complaints of a right breast lump.    Pap Smear: Pap smear not completed today. Last Pap smear was in 10/2018 at [facility]) clinic and was normal. Per patient has no history of an abnormal Pap smear. Last Pap smear result is not available in Epic.   Physical exam: Breasts Breasts symmetrical. No skin abnormalities bilateral breasts. No nipple retraction bilateral breasts. No nipple discharge bilateral breasts. No lymphadenopathy. A lump was palpated in the right breast at 9 o'clock 6 cm from the nipple. Patient states that the lump is painful to touch. She mentions that the pain is throbbing at rates it as a 6/10 in severity. She states that she first noticed the lump one year ago while she was pregnant.      Pelvic/Bimanual Pap is not indicated today.    Smoking History: Patient has never smoked.   Patient Navigation: Patient education provided. Access to services provided for patient through [place] program. Rudene Anda, [place] interpreter provided through [facility].    Breast and Cervical Cancer Risk Assessment: Patient does not have family history of breast cancer, known genetic mutations, or radiation treatment to the chest before age 72. Patient does not have history of cervical dysplasia, immunocompromised, or DES exposure in-utero.  Risk Assessment    Risk Scores      01/19/2020   Last edited by: Demetrius Revel, LPN   5-year risk: 0.3 %   Lifetime risk: 5.8 %          A: [facility] exam without pap smear Complaints of a right breast lump with pain.  P: Referred patient to the [facility] for diagnostic mammogram. Appointment scheduled for Thursday, January 21, 2020 at [place].  Vania Rea, RN, FNP student 01/19/2020 11:33 AM   Attestation of Supervision of Student:  I confirm that I have verified the information documented in the nurse  practitioner student's note and that I have also personally reperformed the history, physical exam and all medical decision making activities.  I have verified that all services and findings are accurately documented in this student's note; and I agree with management and plan as outlined in the documentation. I have also made any necessary editorial changes.  [place], [place] for [employer], [employer] 01/19/2020 1:30 PM

## 2020-01-19 NOTE — Progress Notes (Signed)
Assessment & Plan:  Mattie was seen today for hemorrhoids.  Diagnoses and all orders for this visit:  Rectal pressure -     Fecal occult blood, imunochemical  Hemorrhoids, unspecified hemorrhoid type    Patient has been counseled on age-appropriate routine health concerns for screening and prevention. These are reviewed and up-to-date. Referrals have been placed accordingly. Immunizations are up-to-date or declined.    Subjective:   Chief Complaint  Patient presents with  . Hemorrhoids    Pt. is here for hemorrhoids follow up. Pt. stated it is getting better just Irritation.    HPI Jill [name] [place] 44 y.o. female presents to office today for f/u to hemorrhoids.  VRI was used to communicate directly with patient for the entire encounter including providing detailed patient instructions.   Hemorrhoids She was prescribed anusol and hydroxyzine for itching and rectal pressure. She states she still feels like there are times where there is a large ball inside inside of her rectum. She denies constipation or diarrhea.  I am unable to manually palpate any internal hemorrhoids or visualize any external hemorrhoids today during her office visit.  She still feels discomfort with burning and pain with prolonged sitting.   She had a normal vaginal birth in September 2020.  Denies rectal bleeding, constipation, diarrhea, history of colon cancer or IBS.    Review of Systems  Constitutional: Negative for fever, malaise/fatigue and weight loss.  HENT: Negative.  Negative for nosebleeds.   Eyes: Negative.  Negative for blurred vision, double vision and photophobia.  Respiratory: Negative.  Negative for cough and shortness of breath.   Cardiovascular: Negative.  Negative for chest pain, palpitations and leg swelling.  Gastrointestinal: Negative for abdominal pain, blood in stool, constipation, diarrhea, heartburn, melena, nausea and vomiting.       SEE HPI  Musculoskeletal: Negative.   Negative for myalgias.  Neurological: Negative.  Negative for dizziness, focal weakness, seizures and headaches.  Psychiatric/Behavioral: Negative.  Negative for suicidal ideas.    Past Medical History:  Diagnosis Date  . Infected cyst of Bartholin's gland duct   . Medical history non-contributory     Past Surgical History:  Procedure Laterality Date  . NO PAST SURGERIES    . WISDOM TOOTH EXTRACTION      Family History  Problem Relation Age of Onset  . Diabetes Maternal Grandmother   . Hypertension Mother     Social History Reviewed with no changes to be made today.   Outpatient Medications Prior to Visit  Medication Sig Dispense Refill  . hydrocortisone (ANUSOL-HC) 25 MG suppository Place 1 suppository (25 mg total) rectally 2 (two) times daily. 24 suppository 1  . hydrOXYzine (ATARAX/VISTARIL) 10 MG tablet Take 1 tablet (10 mg total) by mouth 3 (three) times daily as needed. 60 tablet 1   No facility-administered medications prior to visit.    No Known Allergies     Objective:    BP 108/78 (BP Location: Left Arm, Patient Position: Sitting, Cuff Size: Normal)   Pulse 70   Temp 97.7 F (36.5 C) (Temporal)   Ht 4' 11.5" (1.511 m)   Wt 154 lb (69.9 kg)   SpO2 97%   BMI 30.58 kg/m  Wt Readings from Last 3 Encounters:  01/19/20 154 lb (69.9 kg)  01/19/20 154 lb 8 oz (70.1 kg)  12/14/19 175 lb (79.4 kg)    Physical Exam Vitals and nursing note reviewed.  Constitutional:      Appearance: She is well-developed.  HENT:     Head: Normocephalic and atraumatic.  Cardiovascular:     Rate and Rhythm: Normal rate and regular rhythm.     Heart sounds: Normal heart sounds. No murmur heard.  No friction rub. No gallop.   Pulmonary:     Effort: Pulmonary effort is normal. No tachypnea or respiratory distress.     Breath sounds: Normal breath sounds. No decreased breath sounds, wheezing, rhonchi or rales.  Chest:     Chest wall: No tenderness.  Abdominal:      General: Bowel sounds are normal.     Palpations: Abdomen is soft.  Genitourinary:    Exam position: Knee-chest position.     Rectum: No mass, tenderness, anal fissure, external hemorrhoid or internal hemorrhoid. Normal anal tone.  Musculoskeletal:        General: Normal range of motion.     Cervical back: Normal range of motion.  Skin:    General: Skin is warm and dry.  Neurological:     Mental Status: She is alert and oriented to person, place, and time.     Coordination: Coordination normal.  Psychiatric:        Behavior: Behavior normal. Behavior is cooperative.        Thought Content: Thought content normal.        Judgment: Judgment normal.          Patient has been counseled extensively about nutrition and exercise as well as the importance of adherence with medications and regular follow-up. The patient was given clear instructions to go to ER or return to medical center if symptoms don't improve, worsen or new problems develop. The patient verbalized understanding.   Follow-up: Return if symptoms worsen or fail to improve.   Gildardo Pounds, FNP-BC [facility] [place], [place]   01/19/2020, 2:08 PM

## 2020-01-19 NOTE — Patient Instructions (Addendum)
Informed [place] about breast self awareness. Patient did not need a Pap smear today due to last Pap smear was in 10/2018 per patient. Let her know [facility] will cover Pap smears every 3 years unless has a history of abnormal Pap smears. Referred patient to the [facility] for diagnostic mammogram. Appointment scheduled for Thursday, January 21, 2020 at [place]. [place] verbalized understanding.  Vania Rea, RN, FNP student 11:43 AM

## 2020-01-21 ENCOUNTER — Ambulatory Visit
Admission: RE | Admit: 2020-01-21 | Discharge: 2020-01-21 | Disposition: A | Discharge status: Home / Self Care | Payer: No Typology Code available for payment source | Source: Ambulatory Visit | Attending: Obstetrics and Gynecology | Admitting: Obstetrics and Gynecology

## 2020-01-21 ENCOUNTER — Other Ambulatory Visit: Payer: Self-pay | Admitting: Obstetrics and Gynecology

## 2020-01-21 ENCOUNTER — Other Ambulatory Visit: Payer: Self-pay

## 2020-01-21 DIAGNOSIS — N6310 Unspecified lump in the right breast, unspecified quadrant: Secondary | ICD-10-CM

## 2020-01-21 IMAGING — MG DIGITAL DIAGNOSTIC BILAT W/ TOMO W/ CAD
8 of 16 series · 8 of 40 positions shown · non-contrast
Comparison: Previous exam(s).

CLINICAL DATA: Patient presents for palpable abnormality within the
right breast.

EXAM:
DIGITAL DIAGNOSTIC BILATERAL MAMMOGRAM WITH CAD AND TOMO
ULTRASOUND RIGHT BREAST

[R CC]
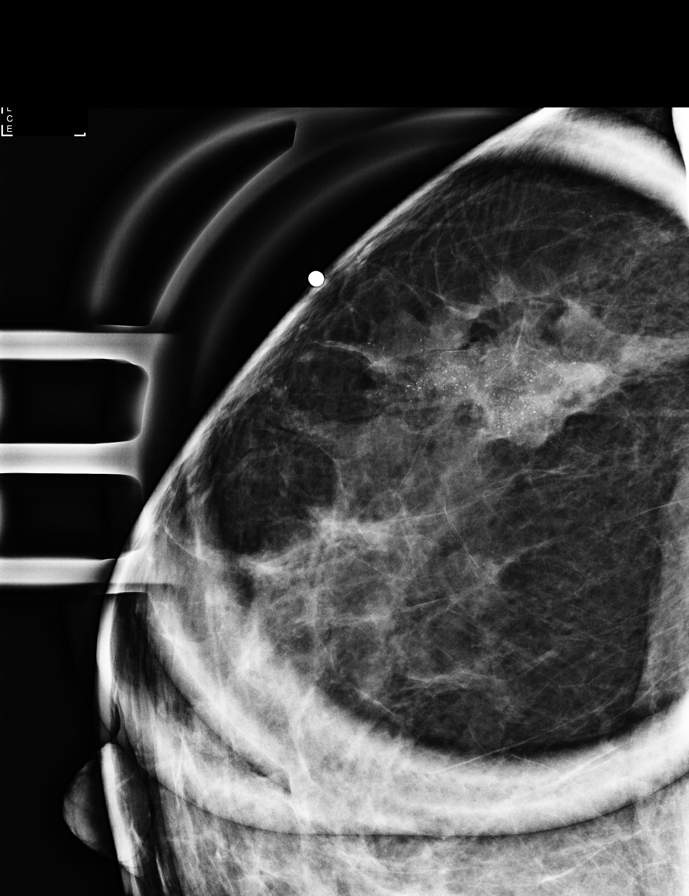

[R ML]
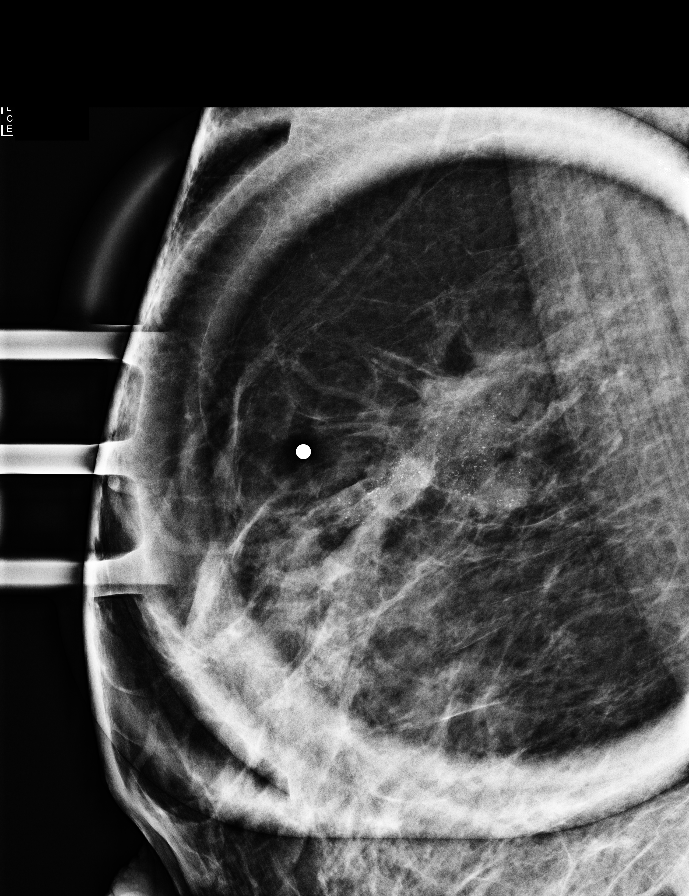

[R CC synth-2D (1 of 2)]
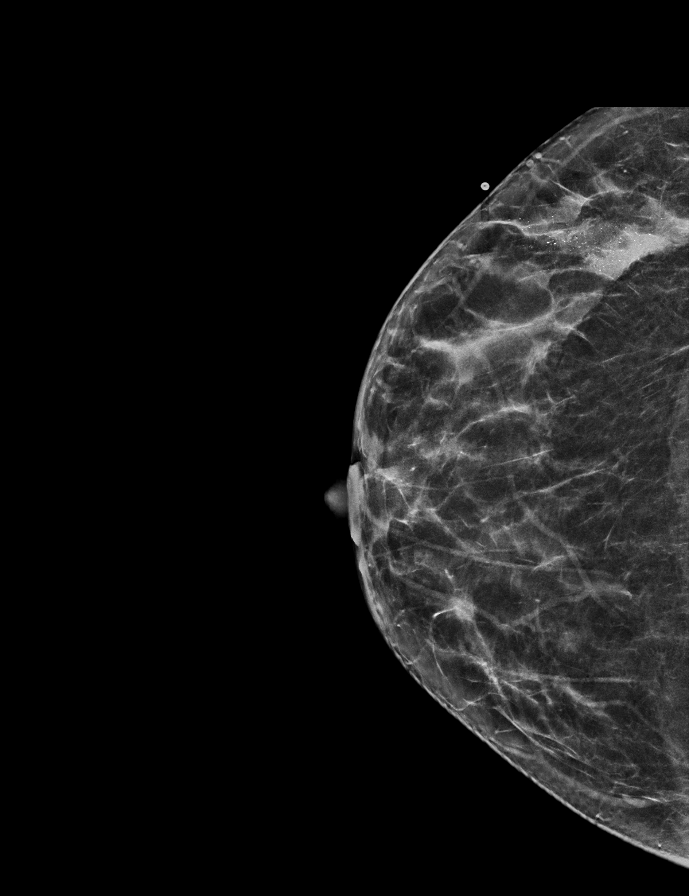

[R TAN synth-2D]
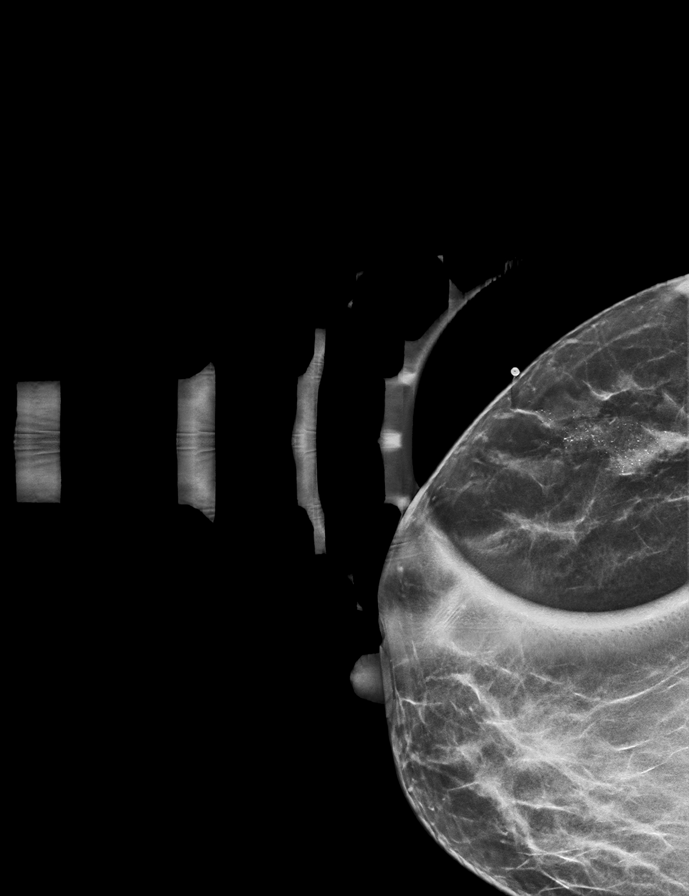

[R CC synth-2D (2 of 2)]
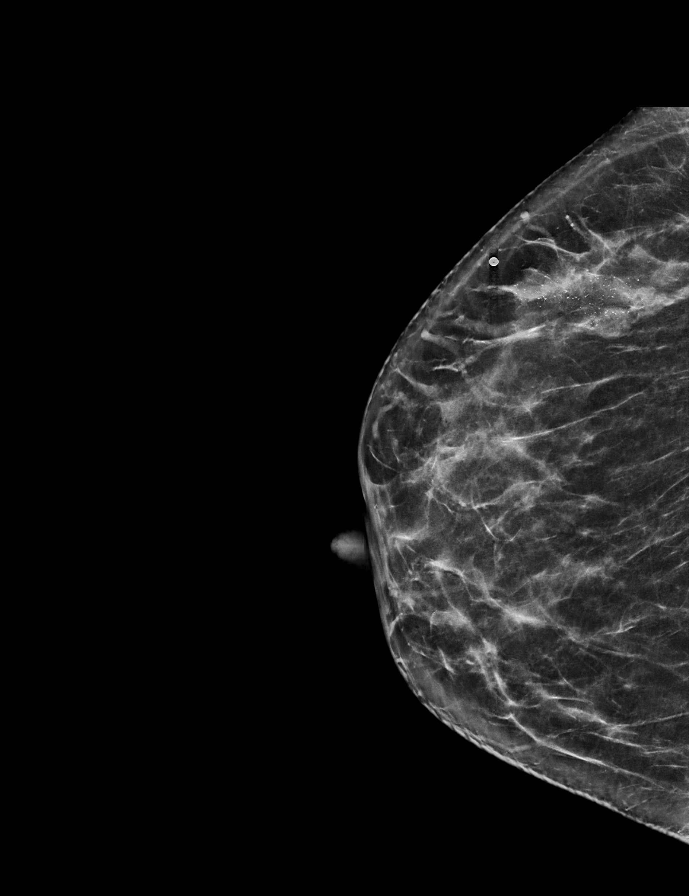

[L CC synth-2D]
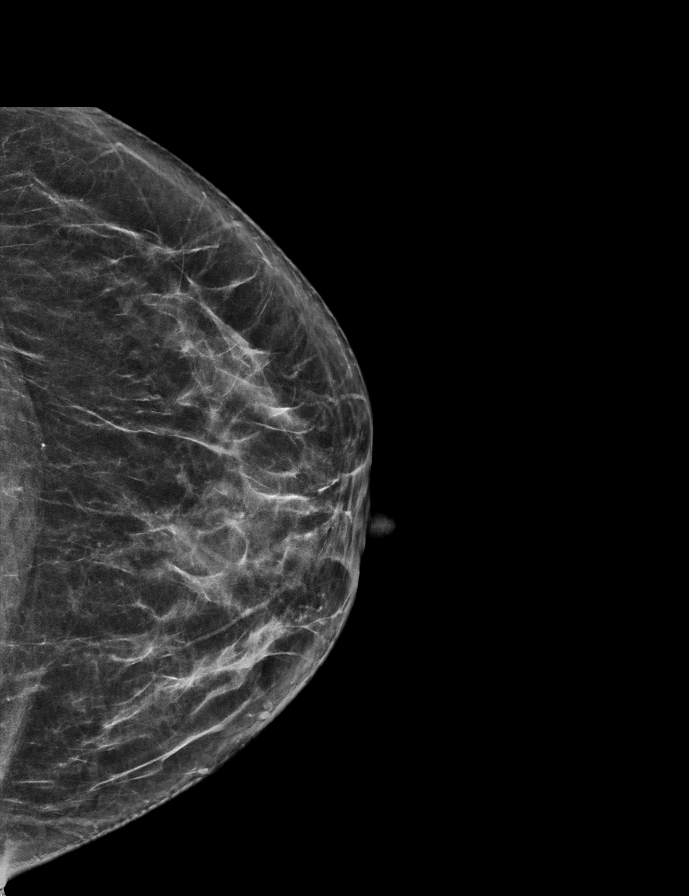

[L MLO synth-2D]
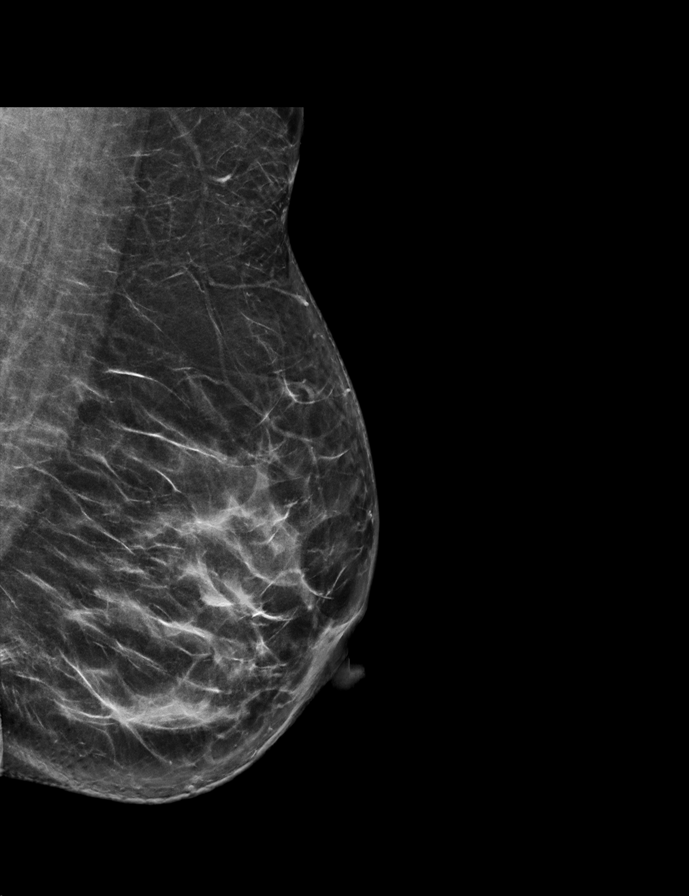

[R MLO synth-2D]
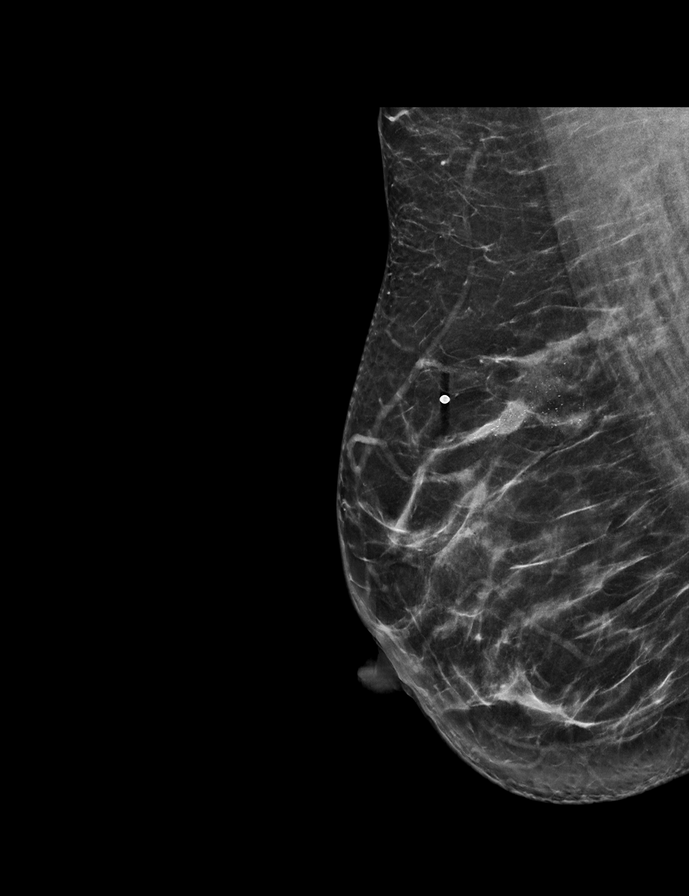

[8 of 40 positions shown; findings below may reference images not displayed]

ACR Breast Density Category c: The breast tissue is heterogeneously
dense, which may obscure small masses.
FINDINGS: There is an irregular mass within the upper-outer right breast
underlying the site of palpable concern. Additionally, at the site
of palpable concern there is a 4.8 x 2.2 x 2.6 cm group of
pleomorphic calcifications, further evaluated with magnification cc
and true lateral views.

Questioned asymmetry within the medial periareolar right breast
predominately effaced with additional imaging suggestive of dense
fibroglandular tissue.

Mammographic images were processed with CAD.

Targeted ultrasound is performed, showing a 2.7 x 4.1 x 1.8 cm
irregular hypoechoic mass right breast 9:30 o'clock 5 cm from nipple
the site of palpable concern.

No suspicious abnormality within the medial periareolar right
breast. Dense tissue is visualized.

No right axillary lymphadenopathy.
IMPRESSION: At the site of palpable concern within the upper-outer right breast
there is a 4.1 cm irregular hypoechoic mass with associated group of
pleomorphic calcifications measuring approximately 4.9 cm in
greatest dimension. Overall findings are concerning for malignancy
at the site of palpable concern.

RECOMMENDATION:
Ultrasound-guided core needle biopsy of the right breast mass 9:30
o'clock 5 cm from nipple.

Consider bilateral breast MRI given breast density and extent of
associated calcifications.

If needed clinically, stereotactic guided core needle biopsy of
either the anterior or posterior margin can be obtained as needed
for surgical planning.

I have discussed the findings and recommendations with the patient.
If applicable, a reminder letter will be sent to the patient
regarding the next appointment.

BI-RADS CATEGORY  5: Highly suggestive of malignancy.

## 2020-01-21 IMAGING — US US BREAST*R* LIMITED INC AXILLA
1 series · 13 of 25 positions shown · non-contrast
Comparison: Previous exam(s).

CLINICAL DATA: Patient presents for palpable abnormality within the
right breast.

EXAM:
DIGITAL DIAGNOSTIC BILATERAL MAMMOGRAM WITH CAD AND TOMO
ULTRASOUND RIGHT BREAST

[Series 1: us breast*right* limited inc axilla · 0.06mm/px · 13 of 26 slices shown]
[im 1/26]
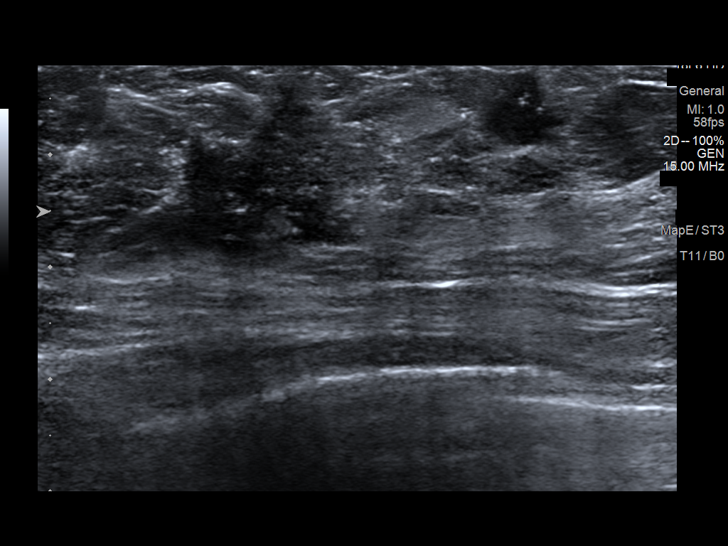
[im 3/26]
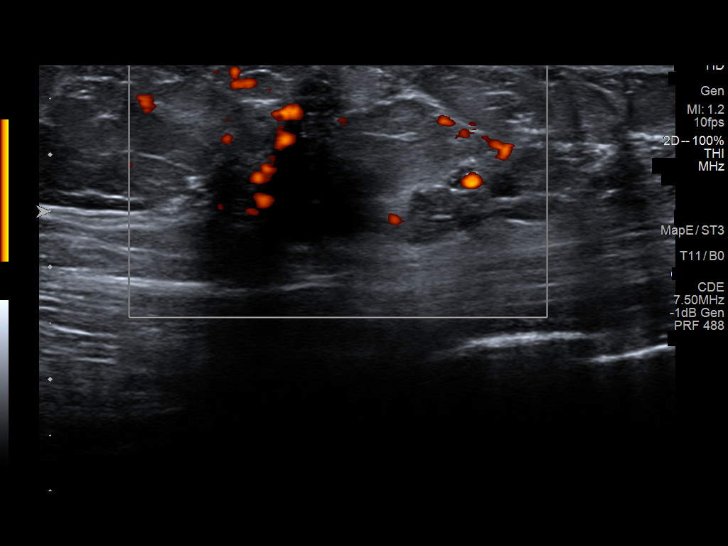
[im 5/26]
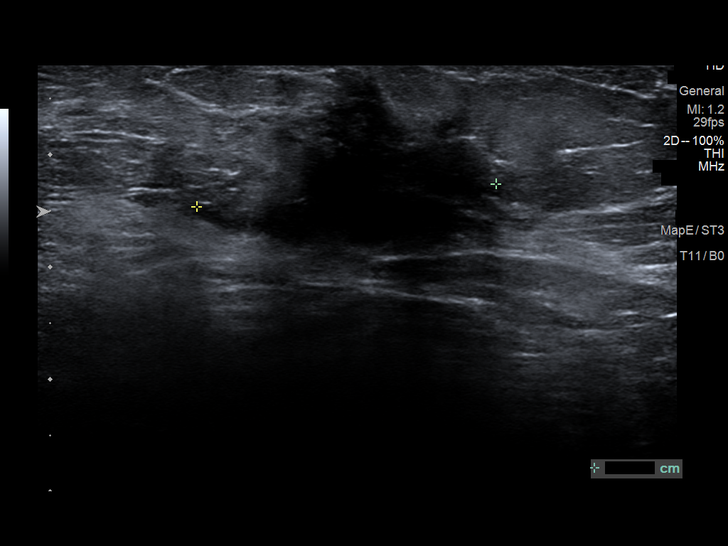
[im 7/26]
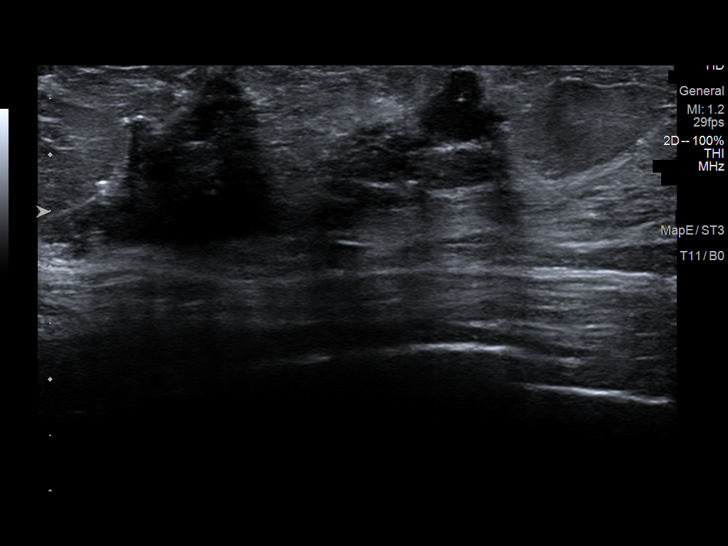
[im 9/26]
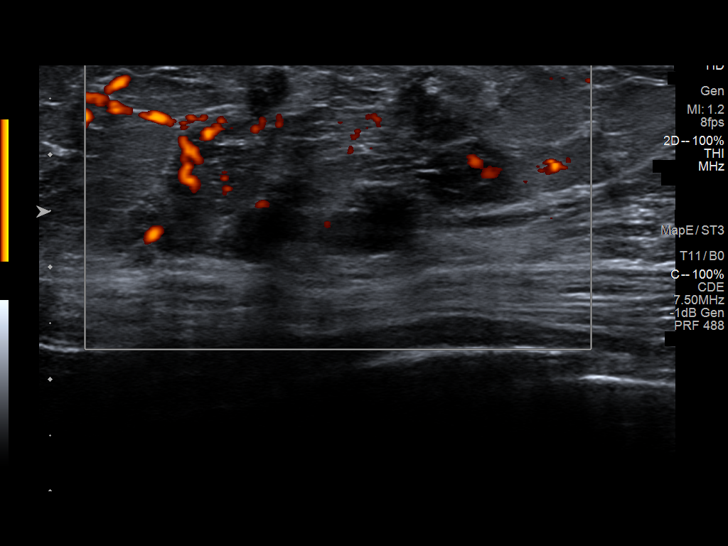
[im 11/26]
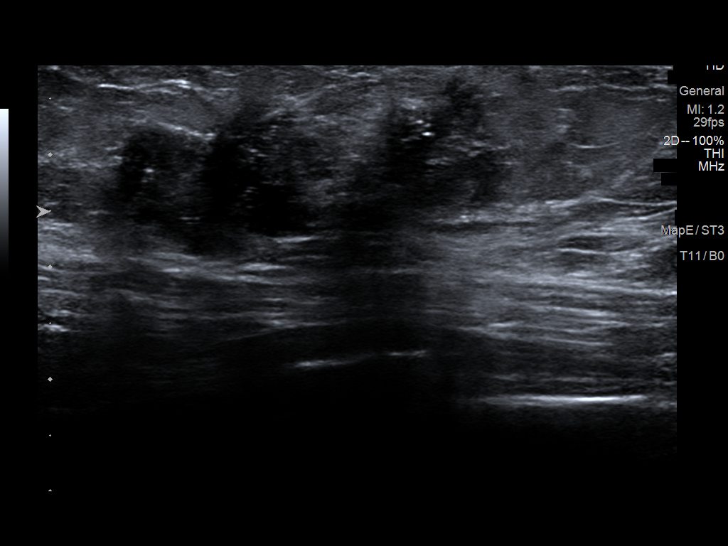
[im 13/26]
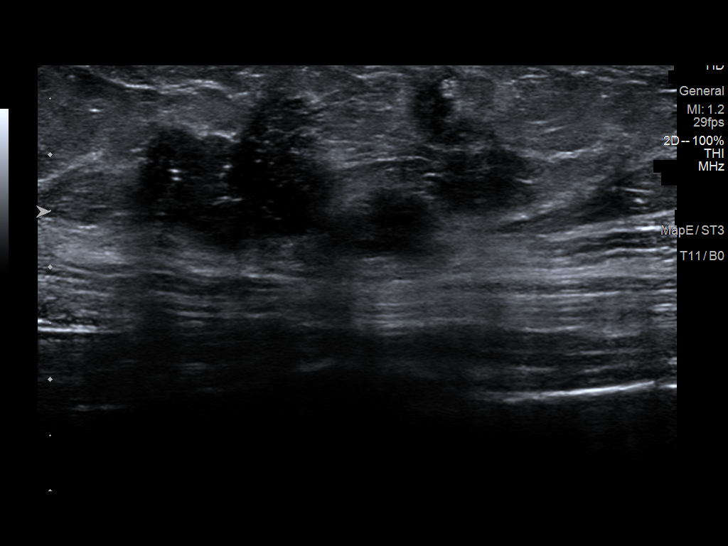
[im 15/26]
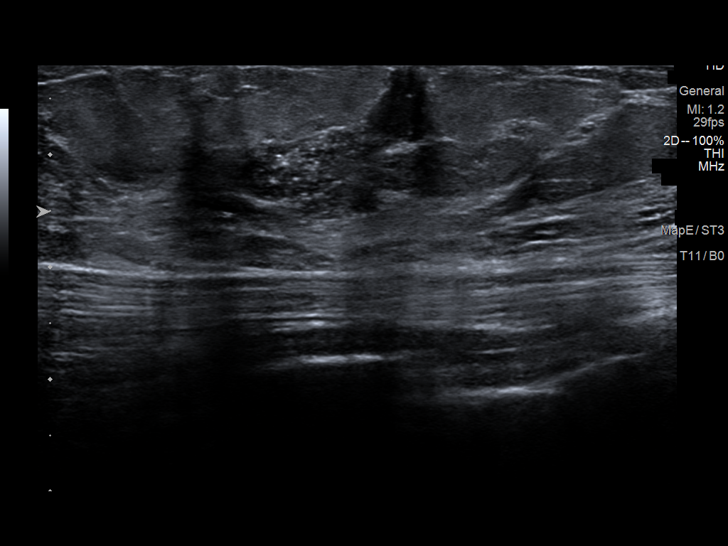
[im 17/26]
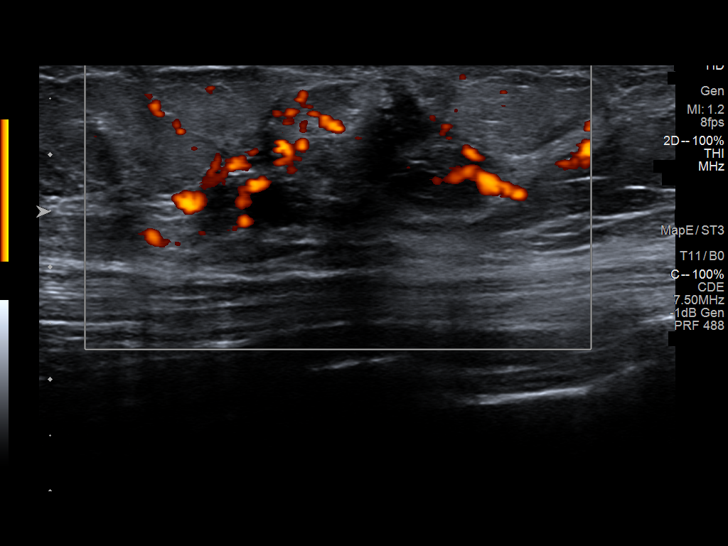
[im 19/26]
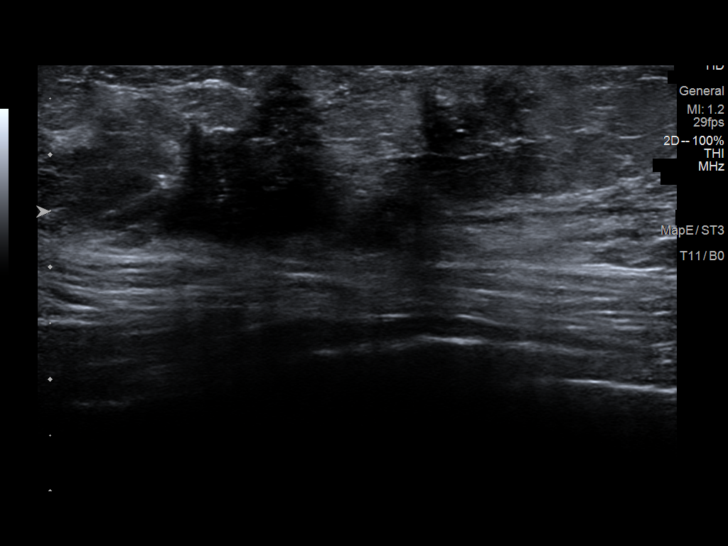
[im 21/26]
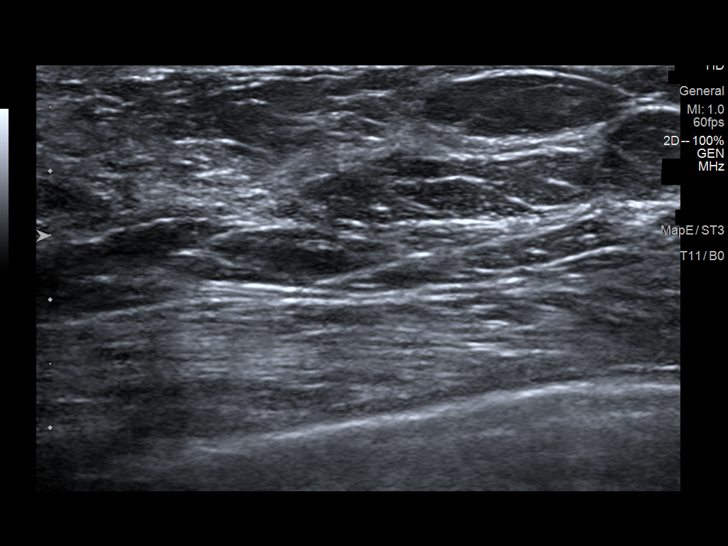
[im 23/26]
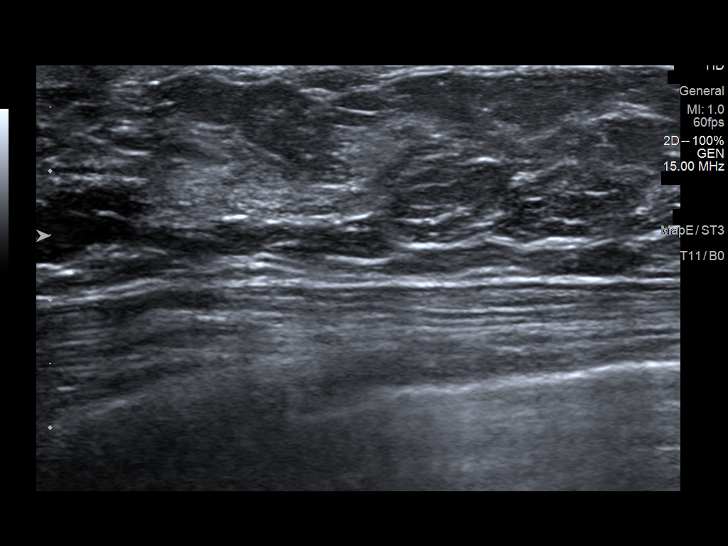
[im 26/26]
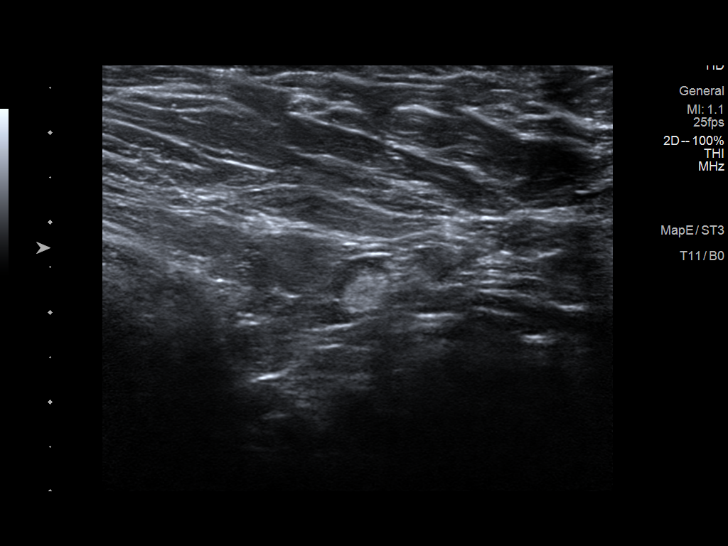

[13 of 25 positions shown; findings below may reference images not displayed]

ACR Breast Density Category c: The breast tissue is heterogeneously
dense, which may obscure small masses.
FINDINGS: There is an irregular mass within the upper-outer right breast
underlying the site of palpable concern. Additionally, at the site
of palpable concern there is a 4.8 x 2.2 x 2.6 cm group of
pleomorphic calcifications, further evaluated with magnification cc
and true lateral views.

Questioned asymmetry within the medial periareolar right breast
predominately effaced with additional imaging suggestive of dense
fibroglandular tissue.

Mammographic images were processed with CAD.

Targeted ultrasound is performed, showing a 2.7 x 4.1 x 1.8 cm
irregular hypoechoic mass right breast 9:30 o'clock 5 cm from nipple
the site of palpable concern.

No suspicious abnormality within the medial periareolar right
breast. Dense tissue is visualized.

No right axillary lymphadenopathy.
IMPRESSION: At the site of palpable concern within the upper-outer right breast
there is a 4.1 cm irregular hypoechoic mass with associated group of
pleomorphic calcifications measuring approximately 4.9 cm in
greatest dimension. Overall findings are concerning for malignancy
at the site of palpable concern.

RECOMMENDATION:
Ultrasound-guided core needle biopsy of the right breast mass 9:30
o'clock 5 cm from nipple.

Consider bilateral breast MRI given breast density and extent of
associated calcifications.

If needed clinically, stereotactic guided core needle biopsy of
either the anterior or posterior margin can be obtained as needed
for surgical planning.

I have discussed the findings and recommendations with the patient.
If applicable, a reminder letter will be sent to the patient
regarding the next appointment.

BI-RADS CATEGORY  5: Highly suggestive of malignancy.

## 2020-02-04 ENCOUNTER — Ambulatory Visit
Admission: RE | Admit: 2020-02-04 | Discharge: 2020-02-04 | Disposition: A | Discharge status: Home / Self Care | Payer: No Typology Code available for payment source | Source: Ambulatory Visit | Attending: Obstetrics and Gynecology | Admitting: Obstetrics and Gynecology

## 2020-02-04 DIAGNOSIS — N6310 Unspecified lump in the right breast, unspecified quadrant: Secondary | ICD-10-CM

## 2020-02-04 IMAGING — MG MM BREAST LOCALIZATION CLIP
4 series · 4 of 12 positions shown · non-contrast
Comparison: Previous exam(s).

CLINICAL DATA: Evaluate biopsy marker

EXAM:
DIAGNOSTIC RIGHT MAMMOGRAM POST ULTRASOUND BIOPSY

[R CC synth-2D]
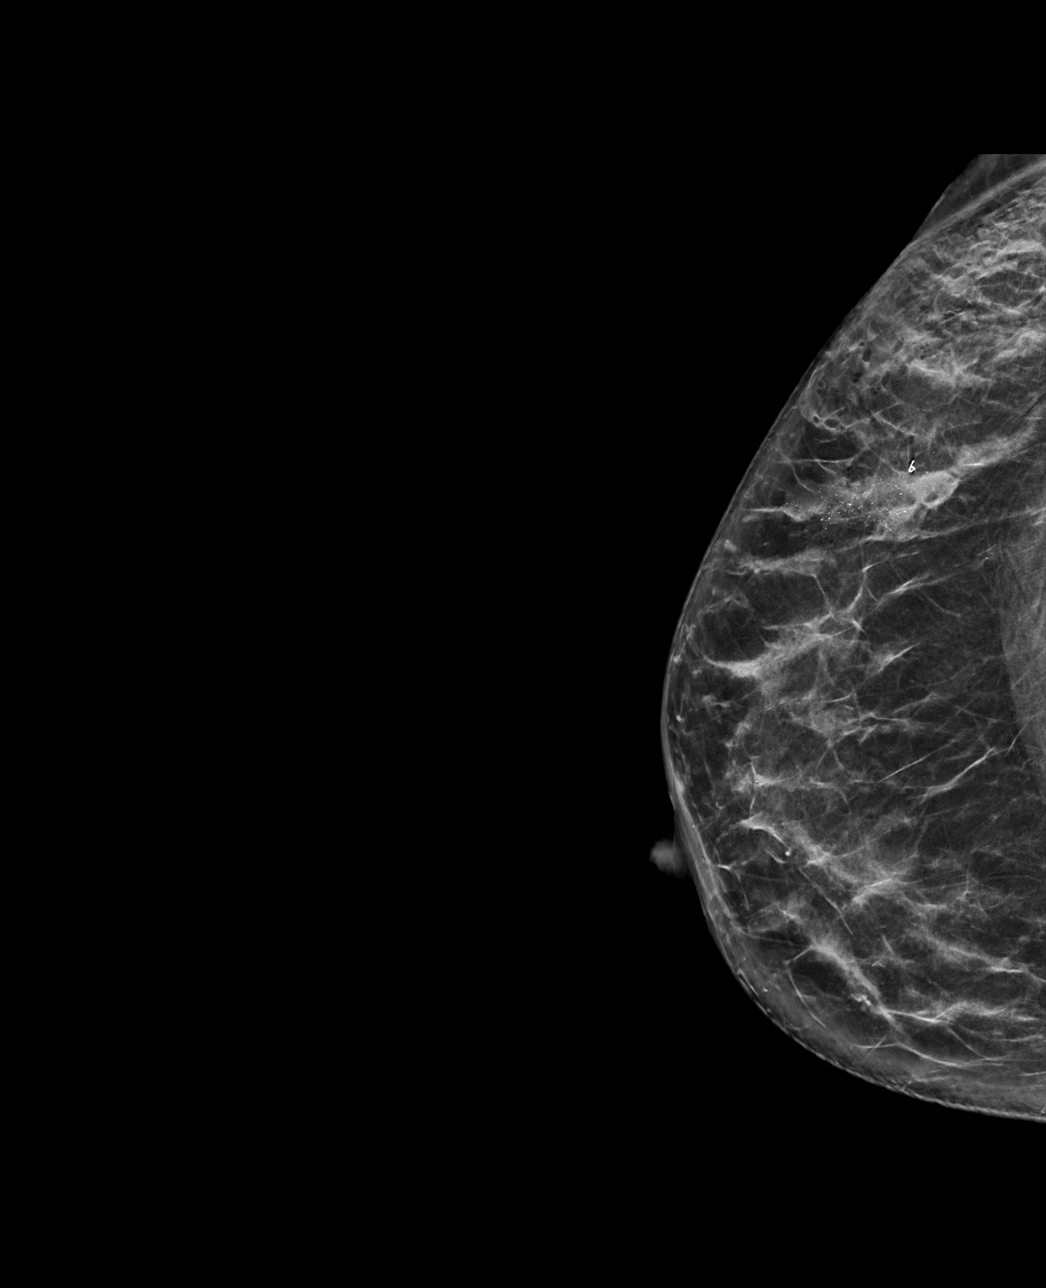

[R ML synth-2D]
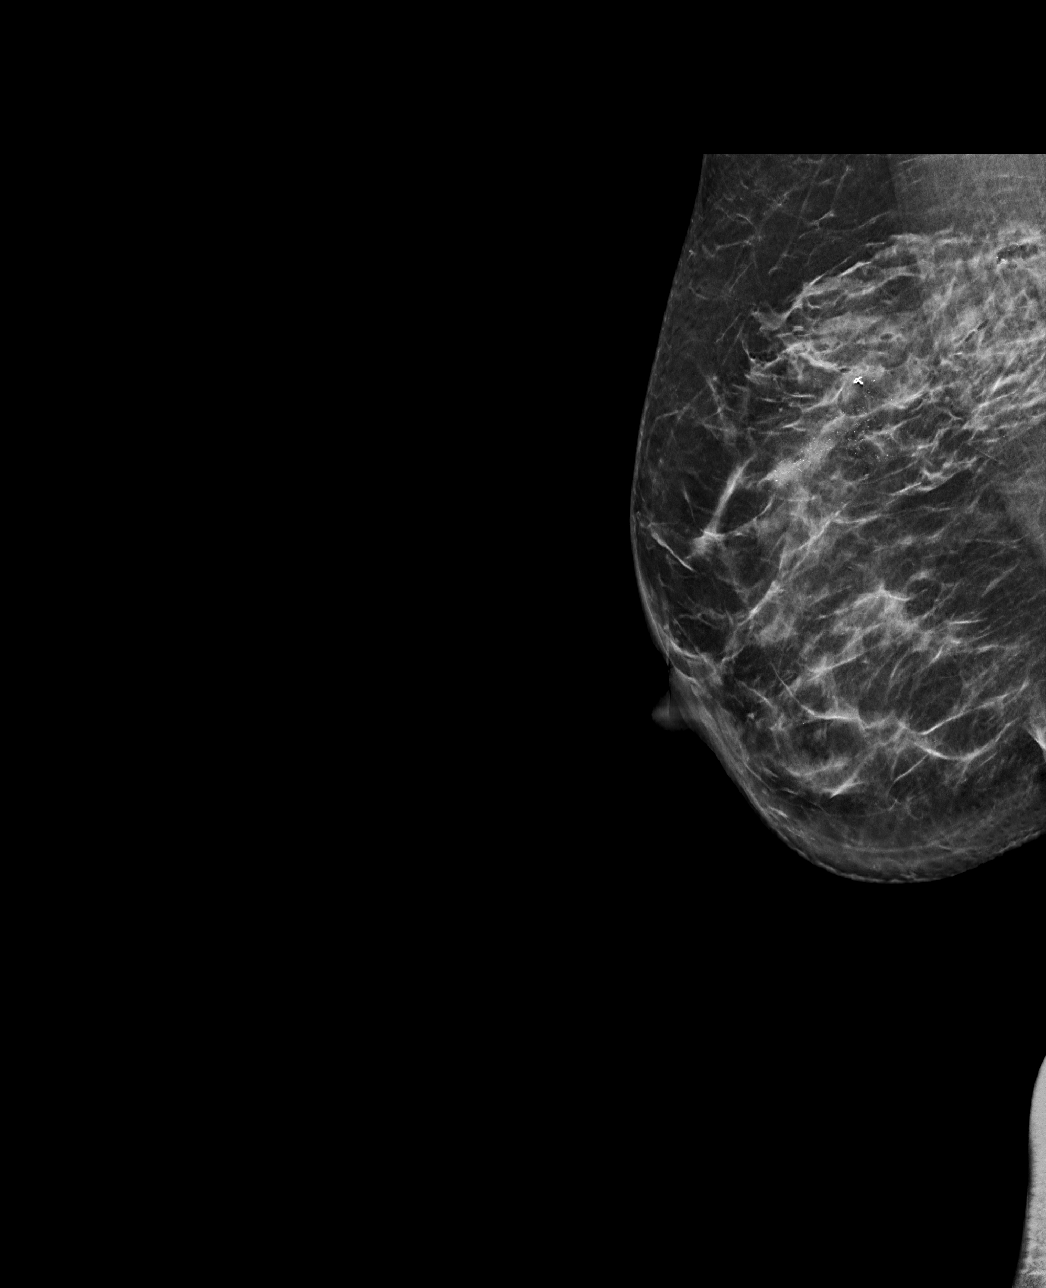

[R CC tomo · tomo slice 31/61.0]
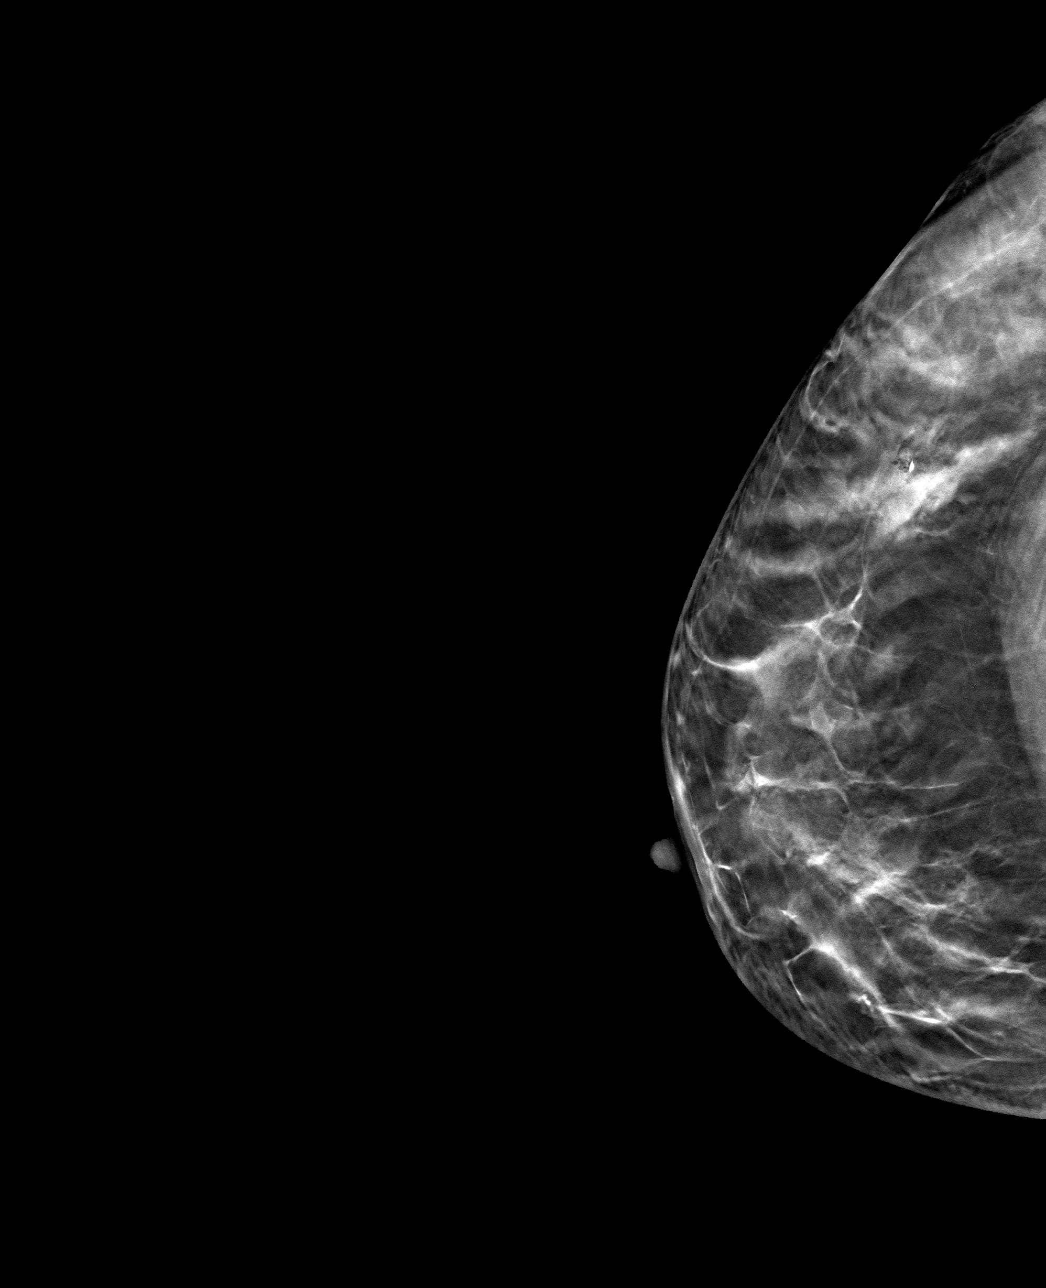

[R ML tomo · tomo slice 34/67.0]
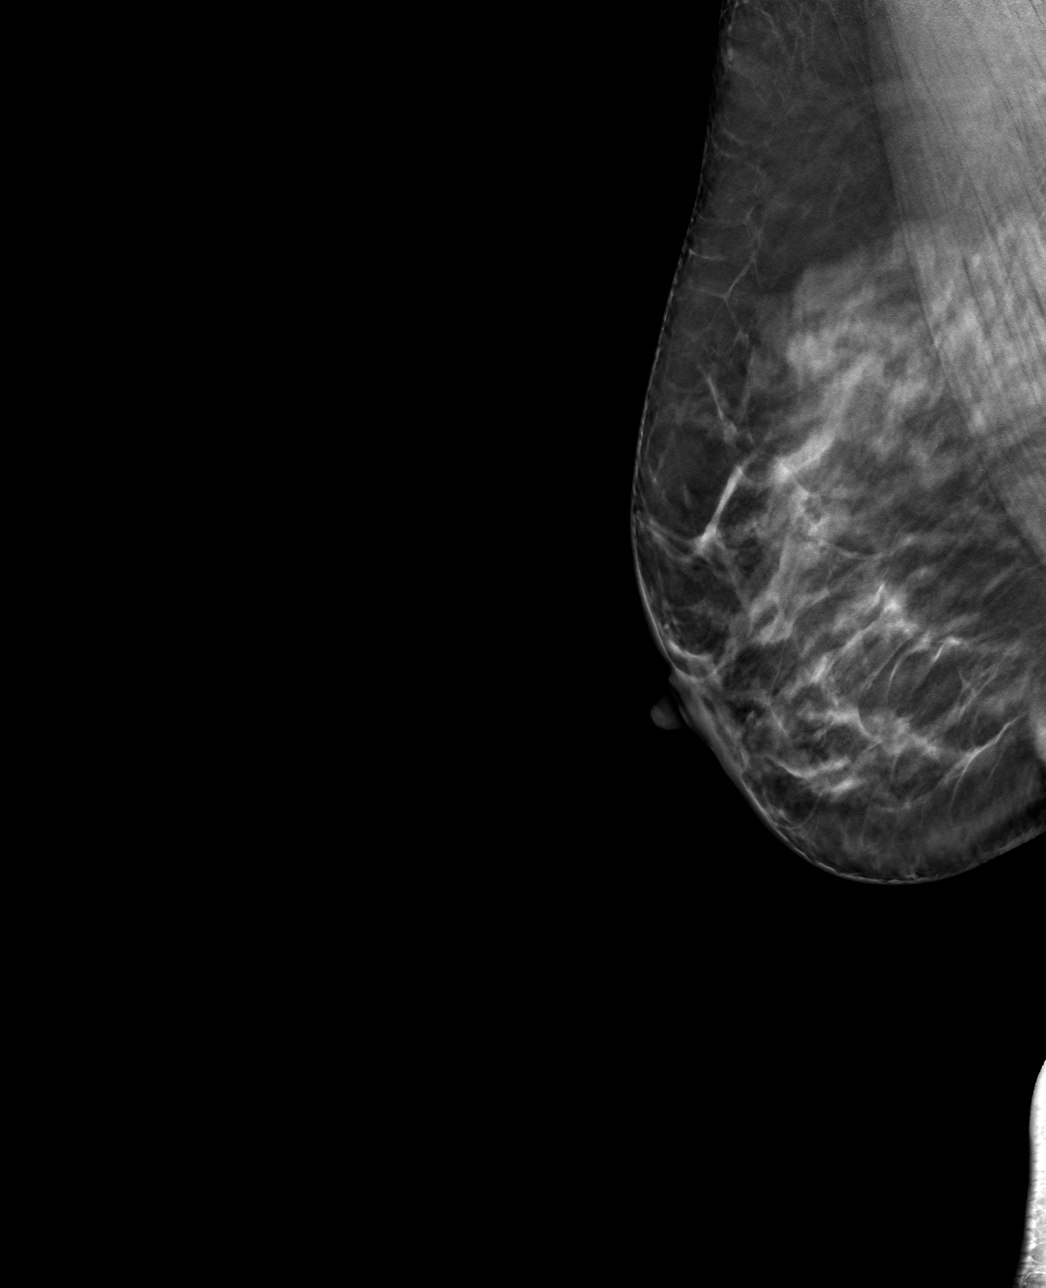

[4 of 12 positions shown; findings below may reference images not displayed]

FINDINGS: Mammographic images were obtained following ultrasound guided biopsy
of a right breast mass. The biopsy marking clip is in expected
position at the site of biopsy.
IMPRESSION: Appropriate positioning of the ribbon shaped biopsy marking clip at
the site of biopsy in the biopsied right breast mass.

Final Assessment: Post Procedure Mammograms for Marker Placement

## 2020-02-04 IMAGING — US US  BREAST BX W/ LOC DEV 1ST LESION IMG BX SPEC US GUIDE*R*
1 series · 8 of 8 positions shown · non-contrast
Comparison: Previous exam(s).
COMPARISON: Previous exam(s).

Addendum:
CLINICAL DATA: Biopsy of a right breast mass

EXAM:
ULTRASOUND GUIDED RIGHT BREAST CORE NEEDLE BIOPSY

[Series 1: us breast bx w/ loc dev 1st lesion img bx spec us  · 0.07mm/px · 8 of 8 slices shown]
[im 1/8]
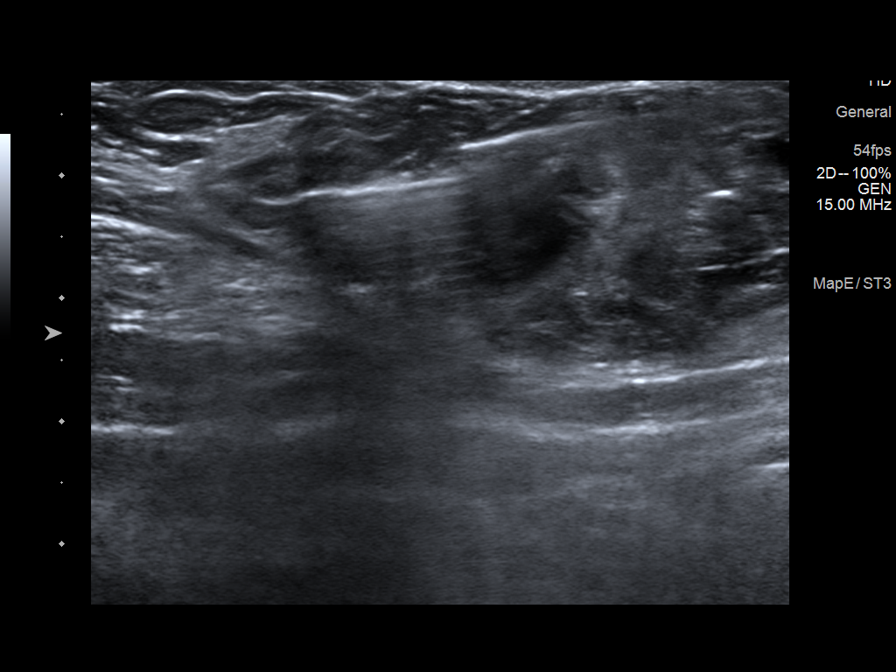
[im 2/8]
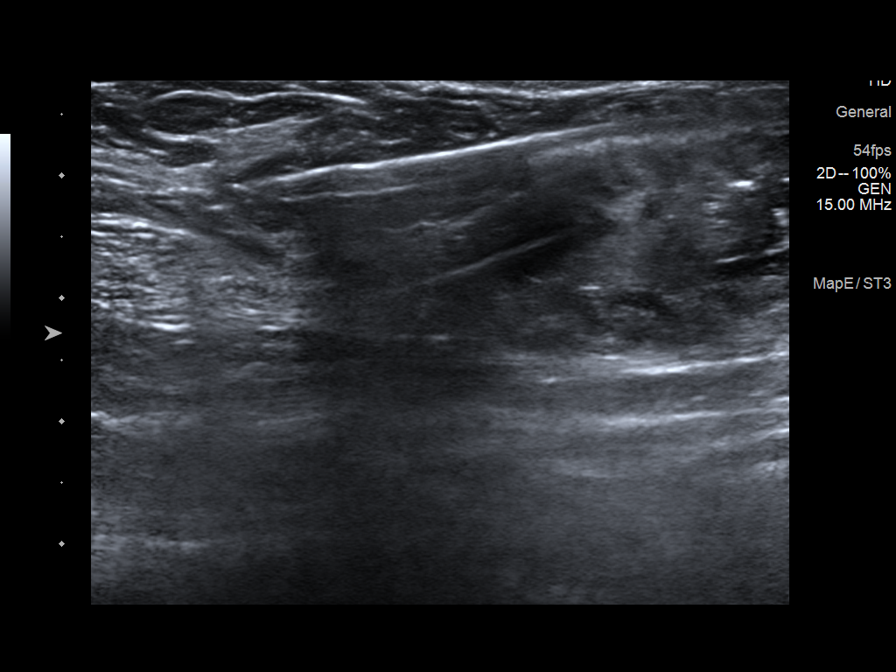
[im 3/8]
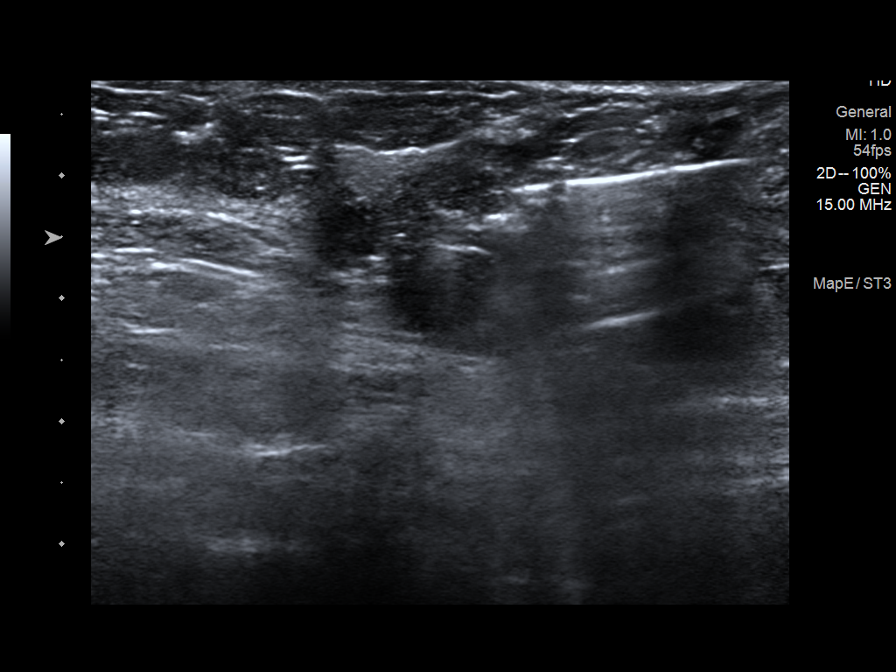
[im 4/8]
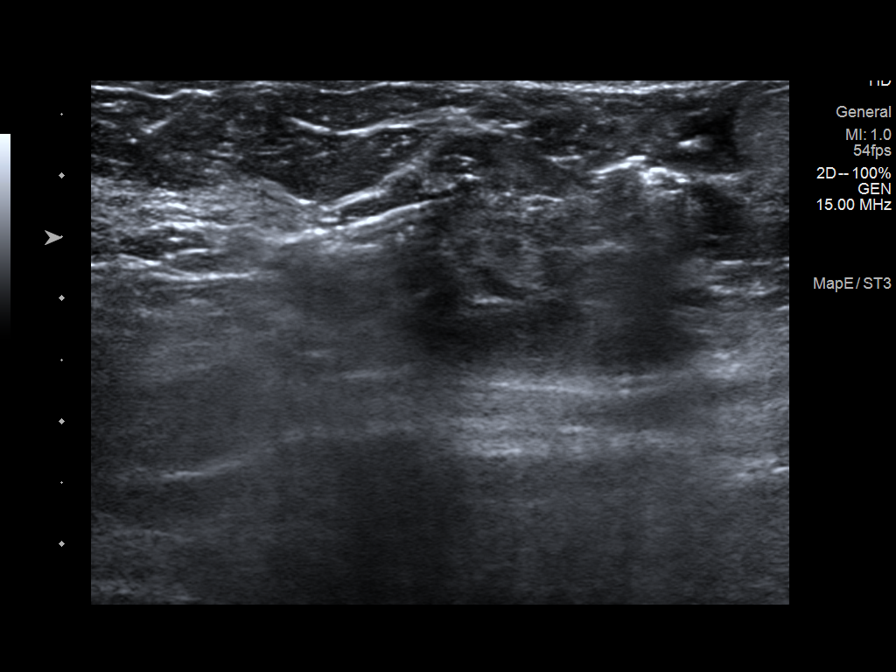
[im 5/8]
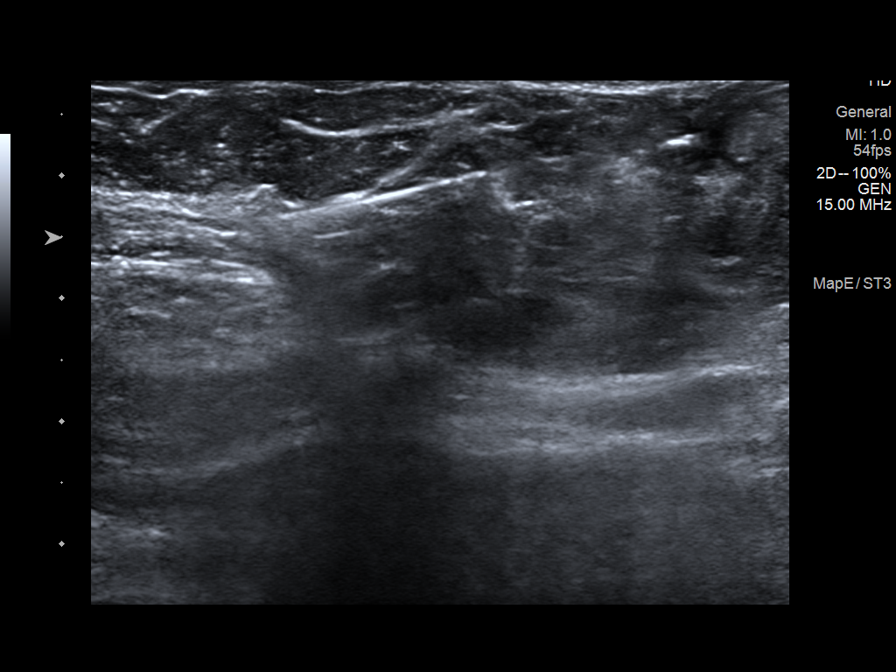
[im 6/8]
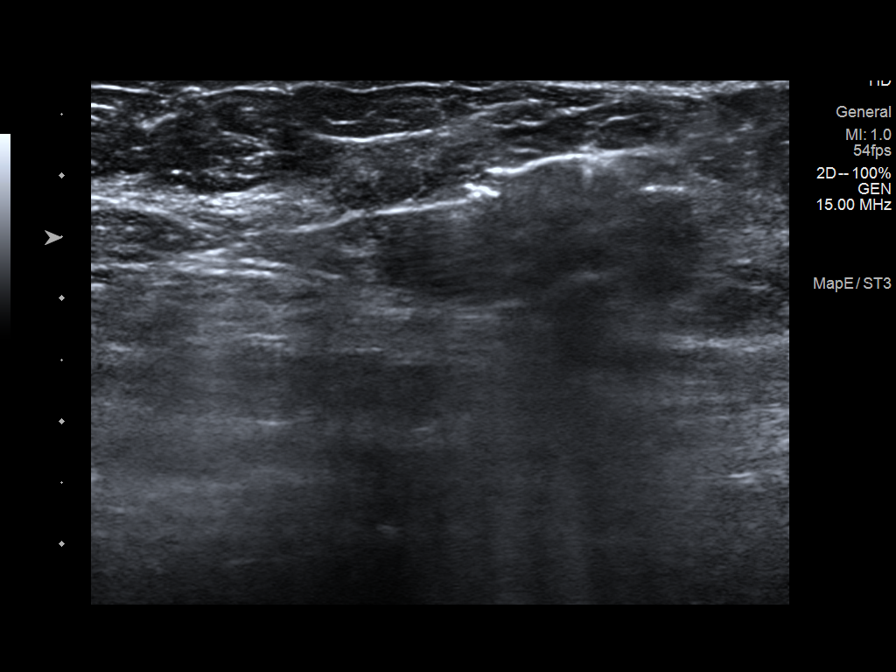
[im 7/8]
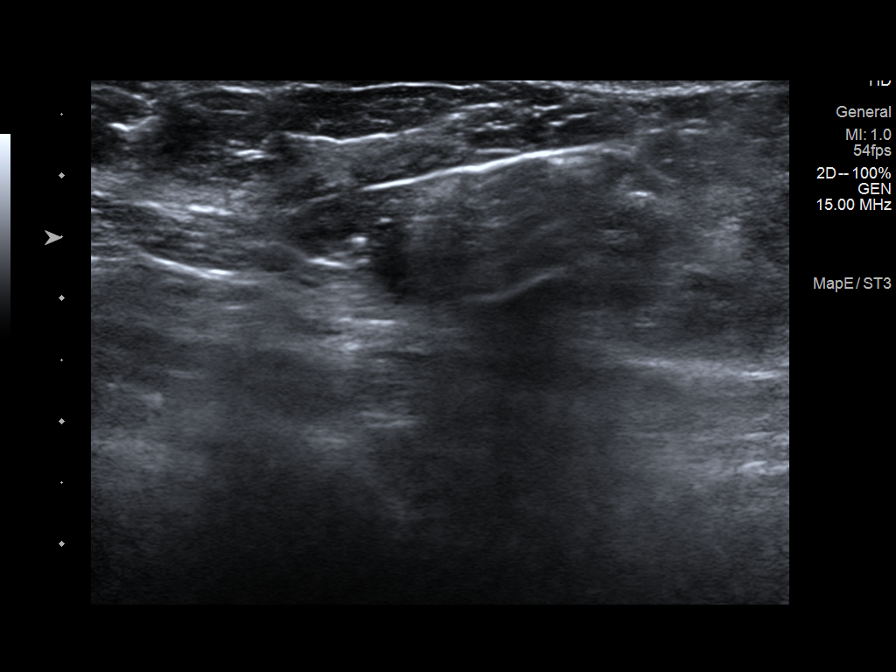
[im 8/8]
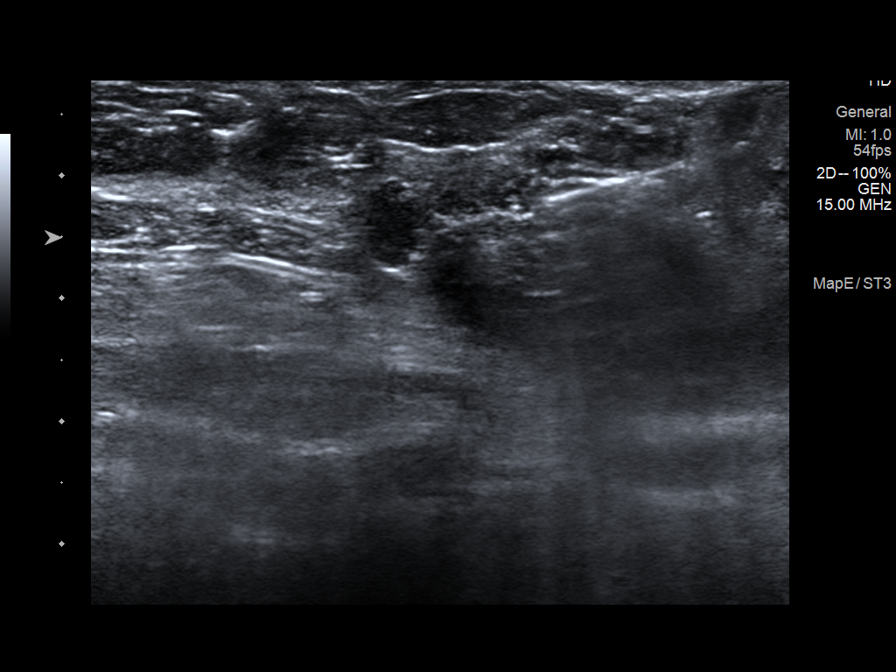

[8 of 8 positions shown; findings below may reference images not displayed]



Lesion quadrant: 930, 5 cm from the nipple

Using sterile technique and 1% Lidocaine as local anesthetic, under
direct ultrasound visualization, a 12 gauge ICKUA device was
used to perform biopsy of a right breast mass at [DATE], 5 cm from the
nipple using a lateral approach. At the conclusion of the procedure
ribbon shaped tissue marker clip was deployed into the biopsy
cavity. Follow up 2 view mammogram was performed and dictated
separately.
IMPRESSION: Ultrasound guided biopsy of a right breast mass. No apparent
complications.

ADDENDUM:
Pathology revealed GRADE II INVASIVE MAMMARY CARCINOMA, MAMMARY
CARCINOMA IN SITU WITH CALCIFICATIONS AND FOCAL NECROSIS of the
RIGHT breast, 9:30 o'clock. This was found to be concordant by Dr.
ICKUA.

Pathology results were discussed with the patient by telephone with
patient reported doing well after the biopsy with tenderness at the
site. Post biopsy instructions and care were reviewed and questions
were answered. The patient was encouraged to call The [REDACTED]

Surgical consultation has been arranged with Dr. ICKUA at
[REDACTED] on [DATE].

Consider bilateral breast MRI given breast density and extent of
associated calcifications.

If needed clinically, stereotactic guided core needle biopsy of
either the anterior or posterior margin can be obtained as needed
for surgical planning.

Pathology results reported by ICKUA RN on [DATE].



Lesion quadrant: 930, 5 cm from the nipple

Using sterile technique and 1% Lidocaine as local anesthetic, under
direct ultrasound visualization, a 12 gauge ICKUA device was
used to perform biopsy of a right breast mass at [DATE], 5 cm from the
nipple using a lateral approach. At the conclusion of the procedure
ribbon shaped tissue marker clip was deployed into the biopsy
cavity. Follow up 2 view mammogram was performed and dictated
separately.
IMPRESSION: Ultrasound guided biopsy of a right breast mass. No apparent
complications.

## 2020-02-09 ENCOUNTER — Telehealth: Payer: Self-pay

## 2020-02-09 NOTE — Telephone Encounter (Signed)
Called patient with interpreter Lilla Shook, [employer] to see if patient is a citizen or resident. Patient stated that she is not. Explained to her that she would not be eligible for [facility] Medicaid. Explained that if she does have to have surgery with [facility] that [facility] will pay for the surgeons fee. Will mail [facility] paperwork to patient. Also instructed patient to meet with one of the financial counselors during her next visit at the [facility]. Patient voiced understanding.

## 2020-02-10 ENCOUNTER — Other Ambulatory Visit: Payer: Self-pay

## 2020-02-10 ENCOUNTER — Telehealth: Payer: Self-pay | Admitting: Oncology

## 2020-02-10 ENCOUNTER — Other Ambulatory Visit: Payer: Self-pay | Admitting: Adult Health

## 2020-02-10 ENCOUNTER — Encounter: Payer: Self-pay | Admitting: Adult Health

## 2020-02-10 DIAGNOSIS — C50411 Malignant neoplasm of upper-outer quadrant of right female breast: Secondary | ICD-10-CM

## 2020-02-10 DIAGNOSIS — Z170 Estrogen receptor positive status [ER+]: Secondary | ICD-10-CM

## 2020-02-10 NOTE — Telephone Encounter (Signed)
Received a new pt referral from Dr. Marlou Starks at [facility] for new dx of breast cancer. [name] has been scheduled to see Dr. Jana Hakim on 8/20 at 1pm w/labs at 1230pm. I cld interpretive services to provide the appt date and time.

## 2020-02-11 ENCOUNTER — Other Ambulatory Visit: Payer: Self-pay | Admitting: Oncology

## 2020-02-11 ENCOUNTER — Other Ambulatory Visit: Payer: Self-pay | Admitting: *Deleted

## 2020-02-11 DIAGNOSIS — Z170 Estrogen receptor positive status [ER+]: Secondary | ICD-10-CM

## 2020-02-11 DIAGNOSIS — C50411 Malignant neoplasm of upper-outer quadrant of right female breast: Secondary | ICD-10-CM

## 2020-02-11 NOTE — Progress Notes (Signed)
[place]  [phone] Fax:([phone]) [phone]     ID: [name] DOB: 1976/06/22  MR#: [id number]  LDJ#:[id number]  Patient Care Team: Gildardo Pounds, NP as PCP - General (Nurse Practitioner) , Virgie Dad, MD as Consulting Physician (Oncology) Jovita Kussmaul, MD as Consulting Physician (General Surgery) Dillingham, Loel Lofty, DO as Attending Physician (Plastic Surgery) Chauncey Cruel, MD OTHER MD:  CHIEF COMPLAINT: triple positive breast cancer  CURRENT TREATMENT: Awaiting definitive surgery   HISTORY OF CURRENT ILLNESS: "Manuela" herself palpated a right breast abnormality sometime in 2019.,  She did not pay much mind.  This was noted again during her pregnancy and she underwent bilateral diagnostic mammography with tomography and right breast ultrasonography at [facility] on 01/21/2020 showing: breast density category C; 4.1 cm irregular mass with associated group of pleomorphic calcifications measuring approximately 4.9 cm, located at site of palpable concern in upper-outer right breast at 9:30; no right axillary lymphadenopathy.  Accordingly on 02/04/2020 she proceeded to biopsy of the right breast area in question. The pathology from this procedure ([id number]) showed: invasive and in situ mammary carcinoma, e-cadherin positive, grade 2. Prognostic indicators significant for: estrogen receptor, 100% positive and progesterone receptor, 100% positive, both with strong staining intensity. Proliferation marker Ki67 at 20%. HER2 equivocal by immunohistochemistry (2+), but positive by fluorescent in situ hybridization with a signals ratio 5.39 and number per cell 11.05.  The patient's subsequent history is as detailed below.   INTERVAL HISTORY: Nahjae was evaluated in the breast cancer clinic on 02/12/2020 accompanied by her significant other [name] and an interpreter. Her case was also presented at the multidisciplinary breast  cancer conference on 02/10/2020. At that time a preliminary plan was proposed: mastectomy with port placement, adjuvant chemo-immunotherapy, anti-estrogens, genetics testing   REVIEW OF SYSTEMS: The patient denies unusual headaches, visual changes, nausea, vomiting, stiff neck, dizziness, or gait imbalance. There has been no cough, phlegm production, or pleurisy, no chest pain or pressure, and no change in bowel or bladder habits. The patient denies fever, rash, bleeding, unexplained fatigue or unexplained weight loss. A detailed review of systems was otherwise entirely negative.   PAST MEDICAL HISTORY: Past Medical History:  Diagnosis Date  . Infected cyst of Bartholin's gland duct   . Medical history non-contributory     PAST SURGICAL HISTORY: Past Surgical History:  Procedure Laterality Date  . NO PAST SURGERIES    . WISDOM TOOTH EXTRACTION      FAMILY HISTORY: Family History  Problem Relation Age of Onset  . Diabetes Maternal Grandmother   . Hypertension Mother   The patient's father is 45 and her mother 44 as of August 2021.  The patient is 3 brothers and 2 sisters.  There is no history of cancer in the family to her knowledge   GYNECOLOGIC HISTORY:  No LMP recorded. (Menstrual status: IUD). Menarche: 44 years old Age at first live birth: 44 years old [place] P 4 LMP irregular Contraceptive: Mirena IUD in place HRT n/a  Hysterectomy? no BSO? 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  Her significant other [name] works Occupational hygienist.  They are both from [place].  The patient's children are ages 64, 14, 16, and 45 months as of August 2021.  The older 3 children are from a different father and their last name is [name].  (  The patient was not married to Mr. [name] so there is no legal issue regarding access to medical records, etc.). The 92-monthold  is a child of Suraiya and [place]  The patient attends a [place] Not in place   HEALTH MAINTENANCE: Social History   Tobacco Use  . Smoking status: Never Smoker  . Smokeless tobacco: Never Used  Vaping Use  . Vaping Use: Never used  Substance Use Topics  . Alcohol use: Never  . Drug use: Never     Colonoscopy: n/a (age)  PAP: 10/2018, negative  Bone density: n/a (age)   No Known Allergies  Current Outpatient Medications  Medication Sig Dispense Refill  . hydrocortisone (ANUSOL-HC) 25 MG suppository Place 1 suppository (25 mg total) rectally 2 (two) times daily. 24 suppository 1  . hydrOXYzine (ATARAX/VISTARIL) 10 MG tablet Take 1 tablet (10 mg total) by mouth 3 (three) times daily as needed. 60 tablet 1  . tamoxifen (NOLVADEX) 20 MG tablet Take 1 tablet (20 mg total) by mouth daily. 90 tablet 12   No current facility-administered medications for this visit.    OBJECTIVE: Latin woman in no acute distress  Vitals:   02/12/20 1255  BP: 112/72  Pulse: 75  Resp: 18  Temp: 98 F (36.7 C)  SpO2: 100%     Body mass index is 30.15 kg/m.   Wt Readings from Last 3 Encounters:  02/12/20 151 lb 12.8 oz (68.9 kg)  01/19/20 154 lb (69.9 kg)  01/19/20 154 lb 8 oz (70.1 kg)      ECOG FS:0 - Asymptomatic  Ocular: Sclerae unicteric, pupils round and equal Ear-nose-throat: Wearing a mask Lymphatic: No cervical or supraclavicular adenopathy Lungs no rales or rhonchi Heart regular rate and rhythm Abd soft, nontender, positive bowel sounds MSK no focal spinal tenderness, no joint edema Neuro: non-focal, well-oriented, appropriate affect Breasts: The right breast mass in the upper outer quadrant is easily palpable, partially movable, not associated with erythema or nipple retraction.  Left breast is benign.  Both axillae are benign.   LAB RESULTS:  CMP     Component Value Date/Time   NA 138 02/12/2020 1219   NA 138 12/15/2019 1543    K 3.7 02/12/2020 1219   CL 105 02/12/2020 1219   CO2 23 02/12/2020 1219   GLUCOSE 157 (H) 02/12/2020 1219   BUN 13 02/12/2020 1219   BUN 10 12/15/2019 1543   CREATININE 0.79 02/12/2020 1219   CALCIUM 9.6 02/12/2020 1219   PROT 7.1 02/12/2020 1219   PROT 7.3 12/15/2019 1543   ALBUMIN 4.2 02/12/2020 1219   ALBUMIN 4.5 12/15/2019 1543   AST 15 02/12/2020 1219   ALT 8 02/12/2020 1219   ALKPHOS 71 02/12/2020 1219   BILITOT 0.6 02/12/2020 1219   GFRNONAA >60 02/12/2020 1219   GFRAA >60 02/12/2020 1219    No results found for: TOTALPROTELP, ALBUMINELP, A1GS, A2GS, BETS, BETA2SER, GAMS, MSPIKE, SPEI  Lab Results  Component Value Date   WBC 5.7 02/12/2020   NEUTROABS 2.7 02/12/2020   HGB 13.6 02/12/2020   HCT 39.5 02/12/2020   MCV 88.8 02/12/2020   PLT 429 (H) 02/12/2020    No results found for: LABCA2  No components found for: [id number] No results for input(s): INR in the last 168 hours.  No results found for: LABCA2  No results found for: CXNT700 No results found for: CFVC944 No results found for: CHQP591 No results found for: [id number]  No components found for: HGQUANT  No results found for: CEA1 / No results found for: CEA1   No results found for: AFPTUMOR  No results found for: CHROMOGRNA  No results found for: KPAFRELGTCHN, LAMBDASER, KAPLAMBRATIO (kappa/lambda light chains)  Lab Results  Component Value Date   HGBA 97.0 10/10/2018   (Hemoglobinopathy evaluation)   No results found for: LDH  No results found for: IRON, TIBC, IRONPCTSAT (Iron and TIBC)  No results found for: FERRITIN  Urinalysis    Component Value Date/Time   COLORURINE YELLOW 12/31/2017 2039   APPEARANCEUR CLOUDY (A) 12/31/2017 2039   LABSPEC 1.015 12/31/2017 2039   PHURINE 5.0 12/31/2017 2039   GLUCOSEU NEGATIVE 12/31/2017 2039   HGBUR MODERATE (A) 12/31/2017 2039   BILIRUBINUR NEGATIVE 12/31/2017 2039   KETONESUR NEGATIVE 12/31/2017 2039   PROTEINUR NEGATIVE 12/31/2017  2039   NITRITE NEGATIVE 12/31/2017 2039   LEUKOCYTESUR LARGE (A) 12/31/2017 2039     STUDIES: US BREAST LTD UNI RIGHT INC AXILLA  Result Date: 01/21/2020 CLINICAL DATA:  Patient presents for palpable abnormality within the right breast. EXAM: DIGITAL DIAGNOSTIC BILATERAL MAMMOGRAM WITH CAD AND TOMO ULTRASOUND RIGHT BREAST COMPARISON:  Previous exam(s). ACR Breast Density Category c: The breast tissue is heterogeneously dense, which may obscure small masses. FINDINGS: There is an irregular mass within the upper-outer right breast underlying the site of palpable concern. Additionally, at the site of palpable concern there is a 4.8 x 2.2 x 2.6 cm group of pleomorphic calcifications, further evaluated with magnification cc and true lateral views. Questioned asymmetry within the medial periareolar right breast predominately effaced with additional imaging suggestive of dense fibroglandular tissue. Mammographic images were processed with CAD. Targeted ultrasound is performed, showing a 2.7 x 4.1 x 1.8 cm irregular hypoechoic mass right breast 9:30 o'clock 5 cm from nipple the site of palpable concern. No suspicious abnormality within the medial periareolar right breast. Dense tissue is visualized. No right axillary lymphadenopathy. IMPRESSION: At the site of palpable concern within the upper-outer right breast there is a 4.1 cm irregular hypoechoic mass with associated group of pleomorphic calcifications measuring approximately 4.9 cm in greatest dimension. Overall findings are concerning for malignancy at the site of palpable concern. RECOMMENDATION: Ultrasound-guided core needle biopsy of the right breast mass 9:30 o'clock 5 cm from nipple. Consider bilateral breast MRI given breast density and extent of associated calcifications. If needed clinically, stereotactic guided core needle biopsy of either the anterior or posterior margin can be obtained as needed for surgical planning. I have discussed the findings  and recommendations with the patient. If applicable, a reminder letter will be sent to the patient regarding the next appointment. BI-RADS CATEGORY  5: Highly suggestive of malignancy. Electronically Signed   By: Lovey Newcomer M.D.   On: 01/21/2020 11:23   MS DIGITAL DIAG TOMO BILAT  Result Date: 01/21/2020 CLINICAL DATA:  Patient presents for palpable abnormality within the right breast. EXAM: DIGITAL DIAGNOSTIC BILATERAL MAMMOGRAM WITH CAD AND TOMO ULTRASOUND RIGHT BREAST COMPARISON:  Previous exam(s). ACR Breast Density Category c: The breast tissue is heterogeneously dense, which may obscure small masses. FINDINGS: There is an irregular mass within the upper-outer right breast underlying the site of palpable concern. Additionally, at the site of palpable concern there is a 4.8 x 2.2 x 2.6 cm group of pleomorphic calcifications, further evaluated with magnification cc and true lateral views. Questioned asymmetry within the medial periareolar right breast predominately effaced with additional imaging suggestive of dense fibroglandular tissue. Mammographic images were  processed with CAD. Targeted ultrasound is performed, showing a 2.7 x 4.1 x 1.8 cm irregular hypoechoic mass right breast 9:30 o'clock 5 cm from nipple the site of palpable concern. No suspicious abnormality within the medial periareolar right breast. Dense tissue is visualized. No right axillary lymphadenopathy. IMPRESSION: At the site of palpable concern within the upper-outer right breast there is a 4.1 cm irregular hypoechoic mass with associated group of pleomorphic calcifications measuring approximately 4.9 cm in greatest dimension. Overall findings are concerning for malignancy at the site of palpable concern. RECOMMENDATION: Ultrasound-guided core needle biopsy of the right breast mass 9:30 o'clock 5 cm from nipple. Consider bilateral breast MRI given breast density and extent of associated calcifications. If needed clinically, stereotactic  guided core needle biopsy of either the anterior or posterior margin can be obtained as needed for surgical planning. I have discussed the findings and recommendations with the patient. If applicable, a reminder letter will be sent to the patient regarding the next appointment. BI-RADS CATEGORY  5: Highly suggestive of malignancy. Electronically Signed   By: Lovey Newcomer M.D.   On: 01/21/2020 11:23   MM CLIP PLACEMENT RIGHT  Result Date: 02/04/2020 CLINICAL DATA:  Evaluate biopsy marker EXAM: DIAGNOSTIC RIGHT MAMMOGRAM POST ULTRASOUND BIOPSY COMPARISON:  Previous exam(s). FINDINGS: Mammographic images were obtained following ultrasound guided biopsy of a right breast mass. The biopsy marking clip is in expected position at the site of biopsy. IMPRESSION: Appropriate positioning of the ribbon shaped biopsy marking clip at the site of biopsy in the biopsied right breast mass. 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 This was found to be concordant by Dr. Dorise Bullion. Pathology results were discussed with the patient by telephone with Lattie Corns, Bilingual Patient Services Representative. The patient reported doing well after the biopsy with tenderness at the site. Post biopsy instructions and care were reviewed and questions were answered. The patient was encouraged to call [facility] for any additional concerns. Surgical consultation has been arranged with Dr. Autumn Messing at [facility] on February 09, 2020. Consider bilateral breast MRI given breast density and extent of associated  calcifications. If needed clinically, stereotactic guided core needle biopsy of either the anterior or posterior margin can be obtained as needed for surgical planning. Pathology results reported by Stacie Acres RN on 02/05/2020. Electronically Signed   By: Dorise Bullion III M.D   On: 02/05/2020 13:05   Result Date: 02/08/2020 CLINICAL DATA:  Biopsy of a right breast mass EXAM: ULTRASOUND GUIDED RIGHT BREAST CORE NEEDLE BIOPSY COMPARISON:  Previous exam(s). PROCEDURE: I met with the patient and we discussed the procedure of ultrasound-guided biopsy, including benefits and alternatives. We discussed the high likelihood of a successful procedure. We discussed the risks of the procedure, including infection, bleeding, tissue injury, clip migration, and inadequate sampling. Informed written consent was given. The usual time-out protocol was performed immediately prior to the procedure. Lesion quadrant: 930, 5 cm from the nipple Using sterile technique and 1% Lidocaine as local anesthetic, under direct ultrasound visualization, a 12 gauge spring-loaded device was used to perform biopsy of  a right breast mass at 9:30, 5 cm from the nipple using a lateral approach. At the conclusion of the procedure ribbon shaped tissue marker clip was deployed into the biopsy cavity. Follow up 2 view mammogram was performed and dictated separately. IMPRESSION: Ultrasound guided biopsy of a right breast mass. No apparent complications. Electronically Signed: By: Dorise Bullion III M.D On: 02/04/2020 14:36     ELIGIBLE FOR AVAILABLE RESEARCH PROTOCOL: AET  ASSESSMENT: 44 y.o. [place] woman status post right breast upper outer quadrant biopsy 02/04/2020 for a clinical T2 N0, stage IB invasive ductal carcinoma, grade 2, triple positive, with an MIB-1 of 20%  (1) definitive surgery pending  (2) adjuvant chemo immunotherapy will consist of carboplatin, docetaxel, trastuzumab and Pertuzumab every 21 days x 6 starting on  (3)  adjuvant radiation as appropriate  (4) genetics testing  (5) tamoxifen started 02/12/2020 in anticipation of possible surgical delays  PLAN: I met today with Desa to review her new diagnosis. Specifically we discussed the biology of her breast cancer, its diagnosis, staging, treatment  options and prognosis. We first reviewed the fact that cancer is not one disease but more than 100 different diseases and that it is important to keep them separate-- otherwise when friends and relatives discuss their own cancer experiences with [place] confusion can result. Similarly we explained that if breast cancer spreads to the bone or liver, the patient would not have bone cancer or liver cancer, but breast cancer in the bone and breast cancer in the liver: one cancer in three places-- not 3 different cancers which otherwise would have to be treated in 3 different ways.  We discussed the difference between local and systemic therapy. In terms of loco-regional treatment, lumpectomy plus radiation is equivalent to mastectomy as far as survival is concerned. For this reason, and because the cosmetic results are generally superior, we recommend breast conserving surgery.   We then discussed the rationale for systemic therapy. There is some risk that this cancer may have already spread to other parts of her body. Patients frequently ask at this point about bone scans, CAT scans and PET scans to find out if they have occult breast cancer somewhere else. The problem is that in early stage disease we are much more likely to find false positives then true cancers and this would expose the patient to unnecessary procedures as well as unnecessary radiation. Scans cannot answer the question the patient really would like to know, which is whether she has microscopic disease elsewhere in her body. For those reasons we do not recommend them.  Of course we would proceed to aggressive evaluation of any symptoms that might suggest  metastatic disease, but that is not the case here.  Next we went over the options for systemic therapy which are anti-estrogens, anti-HER-2 immunotherapy, and chemotherapy. Tiawanna meets criteria for all 3 and this makes her prognosis particularly favorable.  The plan will be to start with surgery, with mastectomy and sentinel lymph node sampling planned.  She will receive adjuvant chemo immunotherapy with carboplatin, docetaxel, trastuzumab and Pertuzumab every 3 weeks for 6 cycles.  If there are positive margins or positive lymph nodes then she would proceed to adjuvant radiation.  At the end of all that she would start antiestrogens.  The patient is not interested in further family and if she maintains ovarian viability after chemotherapy would be a good candidate for goserelin and anastrozole.  Because there may be some delay in surgery since the plastic surgeon  and general surgeons schedules need to be combined, we went ahead and started [place] on tamoxifen after discussing the possible toxicity side effects and complications including the risk of blood clots and the rare cases of uterine cancer.  Amerie has a good understanding of the overall plan. She agrees with it. She knows the goal of treatment in her case is cure. She will call with any problems that may develop before her next visit here, which will be late September, by which time she should have recovered from her surgery and be ready to start chemotherapy  Total encounter time 65 minutes.Sarajane Jews C. , MD 02/12/2020 3:54 PM Medical Oncology and Hematology [facility] [place], [place] [address] Tel. [phone]    Fax. [phone]   This document serves as a record of services personally performed by Lurline Del, MD. It was created on his behalf by Wilburn Mylar, a trained medical scribe. The creation of this record is based on the scribe's personal observations and the provider's  statements to them.   I, Lurline Del MD, have reviewed the above documentation for accuracy and completeness, and I agree with the above.    *Total Encounter Time as defined by the Centers for Medicare and Medicaid Services includes, in addition to the face-to-face time of a patient visit (documented in the note above) non-face-to-face time: obtaining and reviewing outside history, ordering and reviewing medications, tests or procedures, care coordination (communications with other health care professionals or caregivers) and documentation in the medical record.

## 2020-02-12 ENCOUNTER — Encounter: Payer: Self-pay | Admitting: Oncology

## 2020-02-12 ENCOUNTER — Other Ambulatory Visit: Payer: Self-pay

## 2020-02-12 ENCOUNTER — Telehealth: Payer: Self-pay | Admitting: Oncology

## 2020-02-12 ENCOUNTER — Inpatient Hospital Stay: Payer: Self-pay | Attending: Oncology | Admitting: Oncology

## 2020-02-12 ENCOUNTER — Inpatient Hospital Stay: Payer: No Typology Code available for payment source

## 2020-02-12 VITALS — BP 112/72 | HR 75 | Temp 98.0°F | Resp 18 | Wt 151.8 lb

## 2020-02-12 DIAGNOSIS — Z79810 Long term (current) use of selective estrogen receptor modulators (SERMs): Secondary | ICD-10-CM

## 2020-02-12 DIAGNOSIS — Z170 Estrogen receptor positive status [ER+]: Secondary | ICD-10-CM

## 2020-02-12 DIAGNOSIS — C50411 Malignant neoplasm of upper-outer quadrant of right female breast: Secondary | ICD-10-CM

## 2020-02-12 DIAGNOSIS — Z8249 Family history of ischemic heart disease and other diseases of the circulatory system: Secondary | ICD-10-CM

## 2020-02-12 DIAGNOSIS — Z833 Family history of diabetes mellitus: Secondary | ICD-10-CM

## 2020-02-12 DIAGNOSIS — Z79899 Other long term (current) drug therapy: Secondary | ICD-10-CM

## 2020-02-12 LAB — CBC WITH DIFFERENTIAL (CANCER CENTER ONLY)
Abs Immature Granulocytes: 0 10*3/uL (ref 0.00–0.07)
Basophils Absolute: 0.1 10*3/uL (ref 0.0–0.1)
Basophils Relative: 1 %
Eosinophils Absolute: 0.1 10*3/uL (ref 0.0–0.5)
Eosinophils Relative: 2 %
HCT: 39.5 % (ref 36.0–46.0)
Hemoglobin: 13.6 g/dL (ref 12.0–15.0)
Immature Granulocytes: 0 %
Lymphocytes Relative: 45 %
Lymphs Abs: 2.6 10*3/uL (ref 0.7–4.0)
MCH: 30.6 pg (ref 26.0–34.0)
MCHC: 34.4 g/dL (ref 30.0–36.0)
MCV: 88.8 fL (ref 80.0–100.0)
Monocytes Absolute: 0.3 10*3/uL (ref 0.1–1.0)
Monocytes Relative: 5 %
Neutro Abs: 2.7 10*3/uL (ref 1.7–7.7)
Neutrophils Relative %: 47 %
Platelet Count: 429 10*3/uL — ABNORMAL HIGH (ref 150–400)
RBC: 4.45 MIL/uL (ref 3.87–5.11)
RDW: 12 % (ref 11.5–15.5)
WBC Count: 5.7 10*3/uL (ref 4.0–10.5)
nRBC: 0 % (ref 0.0–0.2)

## 2020-02-12 LAB — CMP (CANCER CENTER ONLY)
ALT: 8 U/L (ref 0–44)
AST: 15 U/L (ref 15–41)
Albumin: 4.2 g/dL (ref 3.5–5.0)
Alkaline Phosphatase: 71 U/L (ref 38–126)
Anion gap: 10 (ref 5–15)
BUN: 13 mg/dL (ref 6–20)
CO2: 23 mmol/L (ref 22–32)
Calcium: 9.6 mg/dL (ref 8.9–10.3)
Chloride: 105 mmol/L (ref 98–111)
Creatinine: 0.79 mg/dL (ref 0.44–1.00)
GFR, Est AFR Am: >60 mL/min (ref >60)
GFR, Estimated: >60 mL/min (ref >60)
Glucose, Bld: 157 mg/dL — ABNORMAL HIGH (ref 70–99)
Potassium: 3.7 mmol/L (ref 3.5–5.1)
Sodium: 138 mmol/L (ref 135–145)
Total Bilirubin: 0.6 mg/dL (ref 0.3–1.2)
Total Protein: 7.1 g/dL (ref 6.5–8.1)

## 2020-02-12 MED ORDER — TAMOXIFEN CITRATE 20 MG PO TABS: 20.0000 mg | ORAL_TABLET | Freq: Every day | ORAL | 12 refills | Status: AC

## 2020-02-12 MED FILL — TAMOXIFEN 20 MG TABLET: 20 | 90 days supply | Qty: 90 | Fill #0

## 2020-02-12 NOTE — Telephone Encounter (Signed)
Scheduled appts per 8/20 los. Gave pt a print out of AVS.

## 2020-02-12 NOTE — Progress Notes (Signed)
START ON PATHWAY REGIMEN - Breast     A cycle is every 21 days:     Docetaxel      Carboplatin      Trastuzumab-xxxx      Trastuzumab-xxxx      Pertuzumab      Pertuzumab   **Always confirm dose/schedule in your pharmacy ordering system**  Patient Characteristics: Postoperative without Neoadjuvant Therapy (Pathologic Staging), Invasive Disease, Adjuvant Therapy, HER2 Positive, ER Positive, Node Negative, pT2, pN0, Tumor Size >  3 cm Therapeutic Status: Postoperative without Neoadjuvant Therapy (Pathologic Staging) AJCC Grade: G2 AJCC N Category: pN0 AJCC M Category: cM0 ER Status: Positive (+) AJCC 8 Stage Grouping: IA HER2 Status: Positive (+) Oncotype Dx Recurrence Score: Not Appropriate AJCC T Category: pT2 PR Status: Positive (+) Intent of Therapy: Curative Intent, Discussed with Patient

## 2020-02-12 NOTE — Progress Notes (Signed)
Met with patient/ spouse via interpreter in breast clinic whom requested to speak with me at registration in waiting room to introduce myself as Arboriculturist and to ask how I may help her.  Patient states she is uninsured and concerned about the cost of treatment. Advised all uninsured patients receive an automatic 56% discount for services billed through [facility] and there is an application that may be completed for additional assistance but patient must apply and be denied Medicaid first.  Discussed one-time $1000 Advertising account executive and qualifications to assist with personal expenses while going through treatment. Will meet at next visit after treatment plan to obtain documents for grant.  Gave her my card to have contact information on whom to return information to and for any additional financial questions or concerns.

## 2020-02-15 ENCOUNTER — Encounter: Payer: Self-pay | Admitting: *Deleted

## 2020-02-15 ENCOUNTER — Telehealth: Payer: Self-pay | Admitting: Oncology

## 2020-02-15 NOTE — Telephone Encounter (Signed)
Scheduled appt per 8/20 sch msg - pt is aware of appt scheduled on 8/30 appt.  Interpreter ID :  [id number]

## 2020-02-16 ENCOUNTER — Ambulatory Visit: Payer: Self-pay | Admitting: General Surgery

## 2020-02-16 ENCOUNTER — Other Ambulatory Visit: Payer: Self-pay

## 2020-02-16 ENCOUNTER — Encounter: Payer: Self-pay | Admitting: Plastic Surgery

## 2020-02-16 ENCOUNTER — Ambulatory Visit (INDEPENDENT_AMBULATORY_CARE_PROVIDER_SITE_OTHER): Payer: Self-pay | Admitting: Plastic Surgery

## 2020-02-16 VITALS — BP 108/59 | HR 77 | Temp 98.9°F | Ht 60.0 in | Wt 152.8 lb

## 2020-02-16 DIAGNOSIS — Z170 Estrogen receptor positive status [ER+]: Secondary | ICD-10-CM

## 2020-02-16 DIAGNOSIS — C50411 Malignant neoplasm of upper-outer quadrant of right female breast: Secondary | ICD-10-CM

## 2020-02-16 NOTE — Progress Notes (Signed)
Patient ID: Jill [name], female    DOB: 07-Sep-1975, 44 y.o.   MRN: [id number]   Chief Complaint  Patient presents with  . Consult  . Breast Cancer    The patient is a 44 yrs old Hispanic female here with her daughter (13 yrs old - disabled) and Optometrist for a consultation for breast reconstruction.  The patient palpated an abnormality of the right breast in 2019.  She underwent a mammogram 7/21 and was found to have some calcifications which led to a biopsy 02/04/2020.  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  It is ER/PR positive, HER-2 positive and Ki-67 20%.  The axilla is negative.  She is 5 feet tall and weighs 152 pounds.  Her preoperative bra size is a 36B.  She would like to be around the same size.  She is not a smoker and does not have diabetes.  She has four children.  She is planning on having a right-sided mastectomy with a port a cath placement.  She will undergo adjuvant chemotherapy and antiestrogen treatment.  Genetic testing is also recommended.    Review of Systems  Constitutional: Negative.  Negative for activity change and appetite change.  HENT: Negative.   Eyes: Negative.   Respiratory: Negative.  Negative for chest tightness and shortness of breath.   Cardiovascular: Negative.   Gastrointestinal: Negative.   Endocrine: Negative.   Genitourinary: Negative.   Musculoskeletal: Negative.   Hematological: Negative.   Psychiatric/Behavioral: Negative.     Past Medical History:  Diagnosis Date  . Infected cyst of Bartholin's gland duct   . Medical history non-contributory     Past Surgical History:  Procedure Laterality Date  . NO PAST SURGERIES    . WISDOM TOOTH EXTRACTION        Current Outpatient Medications:  .  hydrOXYzine (ATARAX/VISTARIL) 10 MG tablet, Take 1 tablet (10 mg total) by mouth 3 (three) times daily as needed., Disp: 60 tablet, Rfl: 1 .  tamoxifen (NOLVADEX)  20 MG tablet, Take 1 tablet (20 mg total) by mouth daily., Disp: 90 tablet, Rfl: 12 .  hydrocortisone (ANUSOL-HC) 25 MG suppository, Place 1 suppository (25 mg total) rectally 2 (two) times daily. (Patient not taking: Reported on 02/16/2020), Disp: 24 suppository, Rfl: 1   Objective:   Vitals:   02/16/20 0825  BP: (!) 108/59  Pulse: 77  Temp: 98.9 F (37.2 C)  SpO2: 99%    Physical Exam Vitals and nursing note reviewed.  Constitutional:      Appearance: Normal appearance.  HENT:     Head: Normocephalic and atraumatic.  Cardiovascular:     Rate and Rhythm: Normal rate.     Pulses: Normal pulses.  Pulmonary:     Effort: Pulmonary effort is normal. No respiratory distress.  Abdominal:     General: Abdomen is flat. There is no distension.     Palpations: There is no mass.     Tenderness: There is no abdominal tenderness.  Neurological:     General: No focal deficit present.     Mental Status: She is alert and oriented to person, place, and time.  Psychiatric:        Mood and Affect: Mood normal.        Behavior: Behavior normal.        Thought Content: Thought content normal.     Assessment & Plan:  Malignant neoplasm of upper-outer quadrant of  right breast in female, estrogen receptor positive ([place])  In coordination with the interpreter we had a detailed conversation about the patient's options for breast reconstruction. Several reconstruction options were explained to the patient.  It is important to remember that breast reconstruction is an optional procedure. Reconstruction often requires several stages of surgery and this means more than one operation.  The surgeries are often done several months apart.  The entire process from start to finish can take a year or more. The major goal of breast reconstruction is to look normal in clothing. There will always be scars and a difference noticeable without clothes.  This is true for asymmetries where both breasts will not be  identical.  Surgery may be needed or desired to the non-cancerous breast in order to achieve better symmetry and satisfactory results.  Regardless of the reconstructive method, there is always risks and the possibility that the procedure will fail or have complications.  This couls required additional surgeries.    We discussed the available methods of breast reconstruction and included:  1. Tissue expander with Acellular dermal matrix followed by implant based reconstruction. This can be done as one surgery or multiple surgeries.  2. Autologous reconstruction can include using a muscle or tissue from another area of the body for the reconstruction.  3. Combined procedures like the latissismus dorsi flaps that often uses the muscle with an expander or implant.  For each of the method discussed the risks, benefits, scars and recovery time were discussed in detail. Specific risks included bleeding, infection, hematoma, seroma, scarring, pain, wound healing complications, flap loss, fat necrosis, capsular contracture, need for implant removal, donor site complications, bulge, hernia, umbilical necrosis, need for urgent reoperation, and need for dressing changes.   After the options were discussed we focused on the patient's desires and the procedure that was best for her based on all the information.  A total of 50 minutes of face-to-face time was spent in this encounter, of which >50% was spent in counseling.   If the genetic testing is positive the plan may change.  For now the plan is immediate right breast reconstruction with expander and Flex HD placement.  Three months later she would have removal of the expander and placement of an implant.  She could salvage the nipple areola if it is amenable from a cancer standpoint.  At the 51-monthmark she could have surgery to the contralateral breast if needed for symmetry.  A call was placed to Dr. TMarlou Starksto discuss the above information and I have personally  reviewed her previous notes and visits with oncology.  Pictures were obtained of the patient and placed in the chart with the patient's or guardian's permission.     [place] DO

## 2020-02-22 ENCOUNTER — Telehealth (HOSPITAL_COMMUNITY): Payer: Self-pay

## 2020-02-22 ENCOUNTER — Other Ambulatory Visit: Payer: Self-pay

## 2020-02-22 ENCOUNTER — Inpatient Hospital Stay: Payer: No Typology Code available for payment source

## 2020-02-22 ENCOUNTER — Inpatient Hospital Stay (HOSPITAL_BASED_OUTPATIENT_CLINIC_OR_DEPARTMENT_OTHER): Payer: No Typology Code available for payment source | Admitting: Genetic Counselor

## 2020-02-22 ENCOUNTER — Other Ambulatory Visit: Payer: Self-pay | Admitting: Genetic Counselor

## 2020-02-22 ENCOUNTER — Other Ambulatory Visit (HOSPITAL_COMMUNITY): Payer: Self-pay | Admitting: General Surgery

## 2020-02-22 ENCOUNTER — Inpatient Hospital Stay: Payer: Self-pay

## 2020-02-22 DIAGNOSIS — Z170 Estrogen receptor positive status [ER+]: Secondary | ICD-10-CM

## 2020-02-22 DIAGNOSIS — C50411 Malignant neoplasm of upper-outer quadrant of right female breast: Secondary | ICD-10-CM

## 2020-02-22 LAB — CMP (CANCER CENTER ONLY)
ALT: 8 U/L (ref 0–44)
AST: 13 U/L — ABNORMAL LOW (ref 15–41)
Albumin: 4.1 g/dL (ref 3.5–5.0)
Alkaline Phosphatase: 65 U/L (ref 38–126)
Anion gap: 3 — ABNORMAL LOW (ref 5–15)
BUN: 10 mg/dL (ref 6–20)
CO2: 29 mmol/L (ref 22–32)
Calcium: 9.7 mg/dL (ref 8.9–10.3)
Chloride: 107 mmol/L (ref 98–111)
Creatinine: 0.77 mg/dL (ref 0.44–1.00)
GFR, Est AFR Am: >60 mL/min (ref >60)
GFR, Estimated: >60 mL/min (ref >60)
Glucose, Bld: 100 mg/dL — ABNORMAL HIGH (ref 70–99)
Potassium: 4.2 mmol/L (ref 3.5–5.1)
Sodium: 139 mmol/L (ref 135–145)
Total Bilirubin: 0.3 mg/dL (ref 0.3–1.2)
Total Protein: 7 g/dL (ref 6.5–8.1)

## 2020-02-22 LAB — CBC WITH DIFFERENTIAL (CANCER CENTER ONLY)
Abs Immature Granulocytes: 0.01 10*3/uL (ref 0.00–0.07)
Basophils Absolute: 0.1 10*3/uL (ref 0.0–0.1)
Basophils Relative: 1 %
Eosinophils Absolute: 0.1 10*3/uL (ref 0.0–0.5)
Eosinophils Relative: 2 %
HCT: 41.1 % (ref 36.0–46.0)
Hemoglobin: 13.9 g/dL (ref 12.0–15.0)
Immature Granulocytes: 0 %
Lymphocytes Relative: 36 %
Lymphs Abs: 1.8 10*3/uL (ref 0.7–4.0)
MCH: 30.2 pg (ref 26.0–34.0)
MCHC: 33.8 g/dL (ref 30.0–36.0)
MCV: 89.3 fL (ref 80.0–100.0)
Monocytes Absolute: 0.2 10*3/uL (ref 0.1–1.0)
Monocytes Relative: 4 %
Neutro Abs: 2.9 10*3/uL (ref 1.7–7.7)
Neutrophils Relative %: 57 %
Platelet Count: 382 10*3/uL (ref 150–400)
RBC: 4.6 MIL/uL (ref 3.87–5.11)
RDW: 12 % (ref 11.5–15.5)
WBC Count: 5 10*3/uL (ref 4.0–10.5)
nRBC: 0 % (ref 0.0–0.2)

## 2020-02-22 LAB — GENETIC SCREENING ORDER

## 2020-02-23 NOTE — Progress Notes (Signed)
REFERRING PROVIDER: Chauncey Cruel, MD [address] [place],  [place] [address]  PRIMARY PROVIDER:  Gildardo Pounds, NP  PRIMARY REASON FOR VISIT:  1. Malignant neoplasm of upper-outer quadrant of right breast in female, estrogen receptor positive ([place])     HISTORY OF PRESENT ILLNESS:   [name], a 44 y.o. female, was seen for a [facility] cancer genetics consultation at the request of Dr. Jana Hakim due to a personal history of breast cancer.  [name] presents to clinic today with her daughter to discuss the possibility of a hereditary predisposition to cancer, genetic testing, and to further clarify her future cancer risks, as well as potential cancer risks for family members.  The session was conducted with a Spanish interpreter from the [employer] (ID# [phone]) for half the session and an in-person interpreter for the reminder of the session.  In August 2021, at the age of 44, [name] was diagnosed with invasive ductal carcinoma of the right breast (ER+/PR+/HER2+). The treatment plan includes surgery, adjuvant immunotherapy, and consideration for adjuvant radiation.   CANCER HISTORY:  Oncology History  Malignant neoplasm of upper-outer quadrant of right breast in female, estrogen receptor positive ([place])  02/04/2020 Cancer Staging   Staging form: Breast, AJCC 8th Edition - Clinical stage from 02/04/2020: Stage IB (cT2, cN0, cM0, G3, ER+, PR+, HER2+) - Signed by Gardenia Phlegm, NP on 02/10/2020   02/10/2020 Initial Diagnosis   Malignant neoplasm of upper-outer quadrant of right breast in female, estrogen receptor positive ([place])   03/22/2020 -  Chemotherapy   The patient had DEXAMETHASONE 4 MG PO TABS, 8 mg, Oral, 2 times daily, 0 of 1 cycle, Start date: --, End date: -- PALONOSETRON HCL INJECTION 0.25 MG/5ML, 0.25 mg, Intravenous,  Once, 0 of 6 cycles PEGFILGRASTIM-JMDB 6 MG/0.6ML [place] SOSY, 6 mg, Subcutaneous,  Once, 0 of 6  cycles CARBOPLATIN CHEMO IV INFUSION ORDERABLE (BY AUC), , Intravenous,  Once, 0 of 6 cycles DOCETAXEL CHEMO IV INFUSION ORDERABLE, 75 mg/m2, Intravenous,  Once, 0 of 6 cycles FOSAPREPITANT IV INFUSION 150 MG, 150 mg, Intravenous,  Once, 0 of 6 cycles PERTUZUMAB CHEMO IV INFUSION, 840 mg, Intravenous, Once, 0 of 17 cycles TRASTUZUMAB-DKST CHEMO IV INFUSION, 8 mg/kg, Intravenous,  Once, 0 of 17 cycles  for chemotherapy treatment.     RISK FACTORS:  Menarche was at age 44.  First live birth at age 89.  OCP use for approximately 0 years.  Ovaries intact: yes.  Hysterectomy: no.  Menopausal status: premenopausal.  HRT use: 0 years. Colonoscopy: no; not examined. Mammogram within the last year: yes. Number of breast biopsies: 1. Up to date with pelvic exams: yes; most recent PAP in 10/2018 Any excessive radiation exposure in the past: no  Past Medical History:  Diagnosis Date  . Infected cyst of Bartholin's gland duct   . Medical history non-contributory     Past Surgical History:  Procedure Laterality Date  . NO PAST SURGERIES    . WISDOM TOOTH EXTRACTION      Social History   Socioeconomic History  . Marital status: Single    Spouse name: Not on file  . Number of children: 4  . Years of education: Not on file  . Highest education level: 6th grade  Occupational History  . Not on file  Tobacco Use  . Smoking status: Never Smoker  . Smokeless tobacco: Never Used  Vaping Use  . Vaping Use: Never used  Substance and Sexual Activity  . Alcohol use: Never  . Drug use: Never  . Sexual activity: Not Currently    Birth control/protection: I.U.D.  Other Topics Concern  . Not on file  Social History Narrative  . Not on file   Social Determinants of Health   Financial Resource Strain:   . Difficulty of Paying Living Expenses: Not on file  Food Insecurity:   . Worried About Charity fundraiser in the Last Year: Not on file  . Ran Out of Food in the Last Year: Not on  file  Transportation Needs: No Transportation Needs  . Lack of Transportation (Medical): No  . Lack of Transportation (Non-Medical): No  Physical Activity:   . Days of Exercise per Week: Not on file  . Minutes of Exercise per Session: Not on file  Stress:   . Feeling of Stress : Not on file  Social Connections:   . Frequency of Communication with Friends and Family: Not on file  . Frequency of Social Gatherings with Friends and Family: Not on file  . Attends Religious Services: Not on file  . Active Member of Clubs or Organizations: Not on file  . Attends Archivist Meetings: Not on file  . Marital Status: Not on file     FAMILY HISTORY:  We obtained a detailed, 4-generation family history.  Significant diagnoses are listed below: Family History  Problem Relation Age of Onset  . Diabetes Maternal Grandmother   . Hypertension Mother          [name] has two daughters and two sons, all without a cancer history.  She has three brothers, who are in their 23s, and two sisters, who are in their 1s.  No cancer history was reported in her brothers, sisters, nieces, or nephews.  [name]'s mother is living at age 67.  [name]'s father is living at age 13.  No maternal or paternal family history of cancer was reported.   [name] is unaware of previous family history of genetic testing for hereditary cancer risks. Patient's maternal ancestors are of [place] descent, and paternal ancestors are of [place] descent. There is no reported Ashkenazi Jewish ancestry. There is known consanguinity.  [name]'s parents are first cousins once removed.  Her mother's maternal grandmother and her father's mother are sisters.   GENETIC COUNSELING ASSESSMENT: [name] is a 44 y.o. female with a personal history of breast cancer which is somewhat suggestive of a hereditary breast cancer syndrome and predisposition to  cancer given the age at which [name] has been diagnosed with breast cancer. We, therefore, discussed and recommended the following at today's visit.   DISCUSSION: We discussed that 5 - 10% of cancer is hereditary, with most cases of hereditary breast cancer associated with mutations in BRCA1 and BRCA2.  There are other genes that can be associated with hereditary breast cancer syndromes.  Type of cancer risk and level of risk are gene-specific.  We discussed that testing is beneficial for several reasons including knowing how to follow individuals after completing their treatment, identifying whether potential treatment options would be beneficial, and understanding if other family members could be at risk for cancer and allowing them to undergo genetic testing.   We reviewed the characteristics, features and inheritance patterns of hereditary cancer syndromes. We also discussed genetic testing, including the appropriate family members to test, the process  of testing, insurance coverage and turn-around-time for results. We discussed the implications of a negative, positive and/or variant of uncertain significant result. In order to get genetic test results in a timely manner so that [name] can use these genetic test results for surgical decisions, we recommended [name] pursue genetic testing for the STAT Breast Cancer Panel. Once complete, we recommend [name] pursue reflex genetic testing to a more comprehensive gene panel.   [name]  was offered a common hereditary cancer panel (48 genes) and an expanded pan-cancer panel (85 genes). [name] was informed of the benefits and limitations of each panel, including that expanded pan-cancer panels contain several preliminary evidence genes that do not have clear management guidelines at this point in time.  We also discussed that as the number of genes included on a panel increases,  the chances of variants of uncertain significance increases.  After considering the benefits and limitations of each gene panel, [name] elected to have a common hereditary cancer panel through [facility].  The Common Hereditary Cancer Panel offered by [facility] includes sequencing and/or deletion duplication testing of the following 48 genes: APC, ATM, AXIN2, BARD1, BMPR1A, BRCA1, BRCA2, BRIP1, CDH1, CDK4, CDKN2A (p14ARF), CDKN2A (p16INK4a), CHEK2, CTNNA1, DICER1, EPCAM (Deletion/duplication testing only), GREM1 (promoter region deletion/duplication testing only), KIT, MEN1, MLH1, MSH2, MSH3, MSH6, MUTYH, NBN, NF1, NHTL1, PALB2, PDGFRA, PMS2, POLD1, POLE, PTEN, RAD50, RAD51C, RAD51D, RNF43, SDHB, SDHC, SDHD, SMAD4, SMARCA4. STK11, TP53, TSC1, TSC2, and VHL.  The following genes were evaluated for sequence changes only: SDHA and HOXB13 c.251G>A variant only.  Based on [name]'s diagnosis of breast cancer at age 3, she meets NCCN medical criteria for genetic testing. Despite that she meets criteria, she may still have an out of pocket cost.  We discussed the Patient Assistance Program (PAP) through [facility]. [name] completed the PAP application and provided proof of household income in order to apply to have [facility]'s testing fee waived.   PLAN: After considering the risks, benefits, and limitations, [name] provided informed consent to pursue genetic testing and the blood sample was sent to [facility] for analysis of the STAT Breast Cancer Panel and Common Hereditary Cancers Panel. Results should be available within approximately 10 days for the STAT panel and approximately 3 weeks for the Common Hereditary Cancers Panel, at which point they will be disclosed by telephone to [name], as will any additional recommendations warranted by these results. [name] will receive a summary of her genetic counseling visit and a copy of  her results once available. This information will also be available in Epic.   Lastly, we encouraged [name] to remain in contact with cancer genetics annually so that we can continuously update the family history and inform her of any changes in cancer genetics and testing that may be of benefit for this family.   [name]'s questions were answered to her satisfaction today. Our contact information was provided should additional questions or concerns arise. Thank you for the referral and allowing [place] to share in the care of your patient.   Jill [name] M. Joette Catching, [email or web address] (P) [phone]  The patient was seen for a total of 40 minutes in face-to-face genetic counseling.  This patient was discussed with Drs. Magrinat, Lindi Adie and/or Burr Medico who agrees with the above.  _______________________________________________________________________ For Office Staff:  Number of people involved in session: 1 Was an Intern/ student involved with case: no

## 2020-02-25 ENCOUNTER — Encounter: Payer: Self-pay | Admitting: *Deleted

## 2020-02-26 ENCOUNTER — Encounter: Payer: Self-pay | Admitting: *Deleted

## 2020-03-01 ENCOUNTER — Other Ambulatory Visit: Payer: Self-pay

## 2020-03-01 ENCOUNTER — Other Ambulatory Visit: Payer: Self-pay | Admitting: Physician Assistant

## 2020-03-01 ENCOUNTER — Other Ambulatory Visit: Payer: Self-pay | Admitting: Student

## 2020-03-01 ENCOUNTER — Encounter (HOSPITAL_BASED_OUTPATIENT_CLINIC_OR_DEPARTMENT_OTHER): Payer: Self-pay | Admitting: General Surgery

## 2020-03-02 ENCOUNTER — Other Ambulatory Visit: Payer: Self-pay

## 2020-03-02 ENCOUNTER — Ambulatory Visit (HOSPITAL_COMMUNITY)
Admission: RE | Admit: 2020-03-02 | Discharge: 2020-03-02 | Disposition: A | Discharge status: Home / Self Care | Payer: No Typology Code available for payment source | Source: Ambulatory Visit | Attending: General Surgery | Admitting: General Surgery

## 2020-03-02 DIAGNOSIS — C50411 Malignant neoplasm of upper-outer quadrant of right female breast: Secondary | ICD-10-CM

## 2020-03-02 DIAGNOSIS — Z170 Estrogen receptor positive status [ER+]: Secondary | ICD-10-CM | POA: Insufficient documentation

## 2020-03-02 HISTORY — PX: IR IMAGING GUIDED PORT INSERTION: IMG5740

## 2020-03-02 LAB — PREGNANCY, URINE: Preg Test, Ur: NEGATIVE

## 2020-03-02 IMAGING — US IR IMAGING GUIDED PORT INSERTION
2 series · 2 of 2 positions shown · non-contrast
Comparison: none

INDICATION: 43-year-old female with right upper outer quadrant breast cancer, ER
positive. She presents for port catheter placement to establish
durable venous access.

[Series 1: fl (-) angio · 1 of 1 slices shown]
[im 1/1]
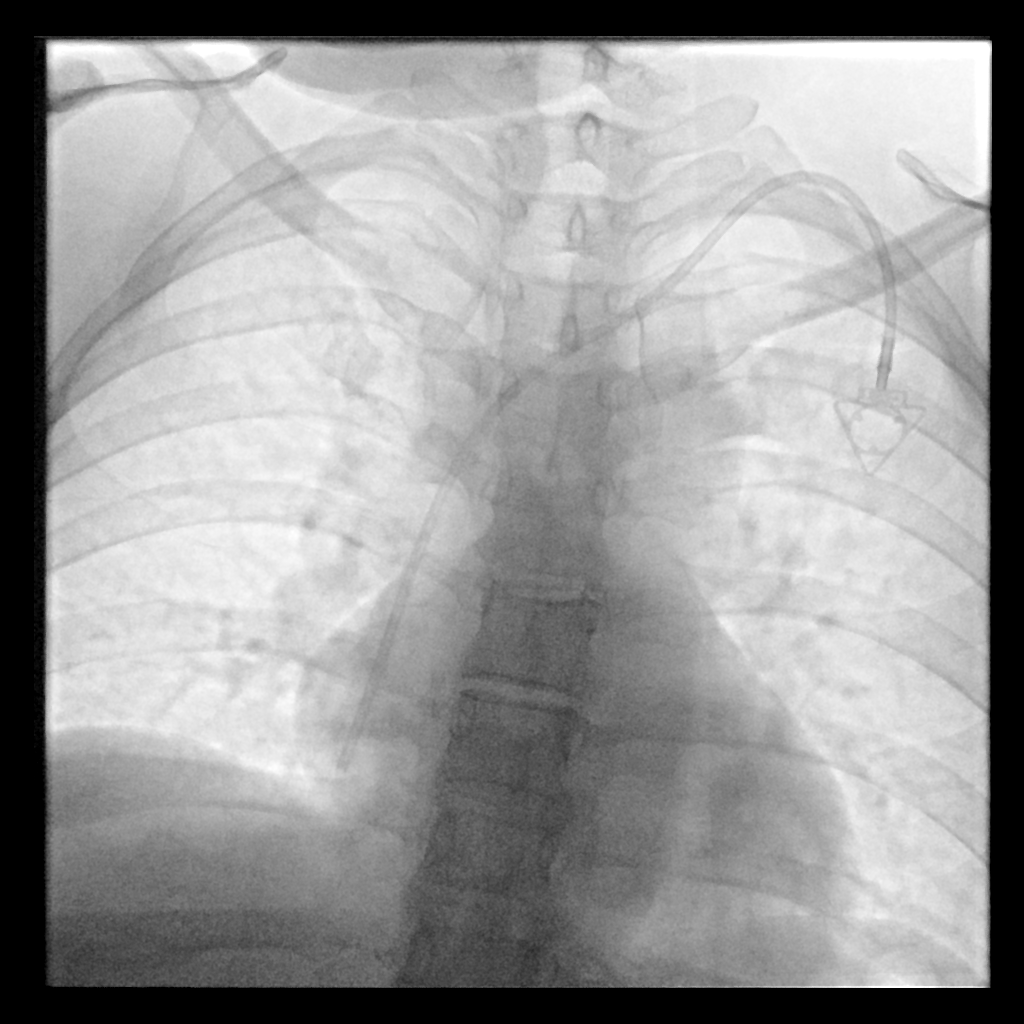

[Series 1: ir imaging guided port insertion · 1 of 1 slices shown]
[im 1/1]
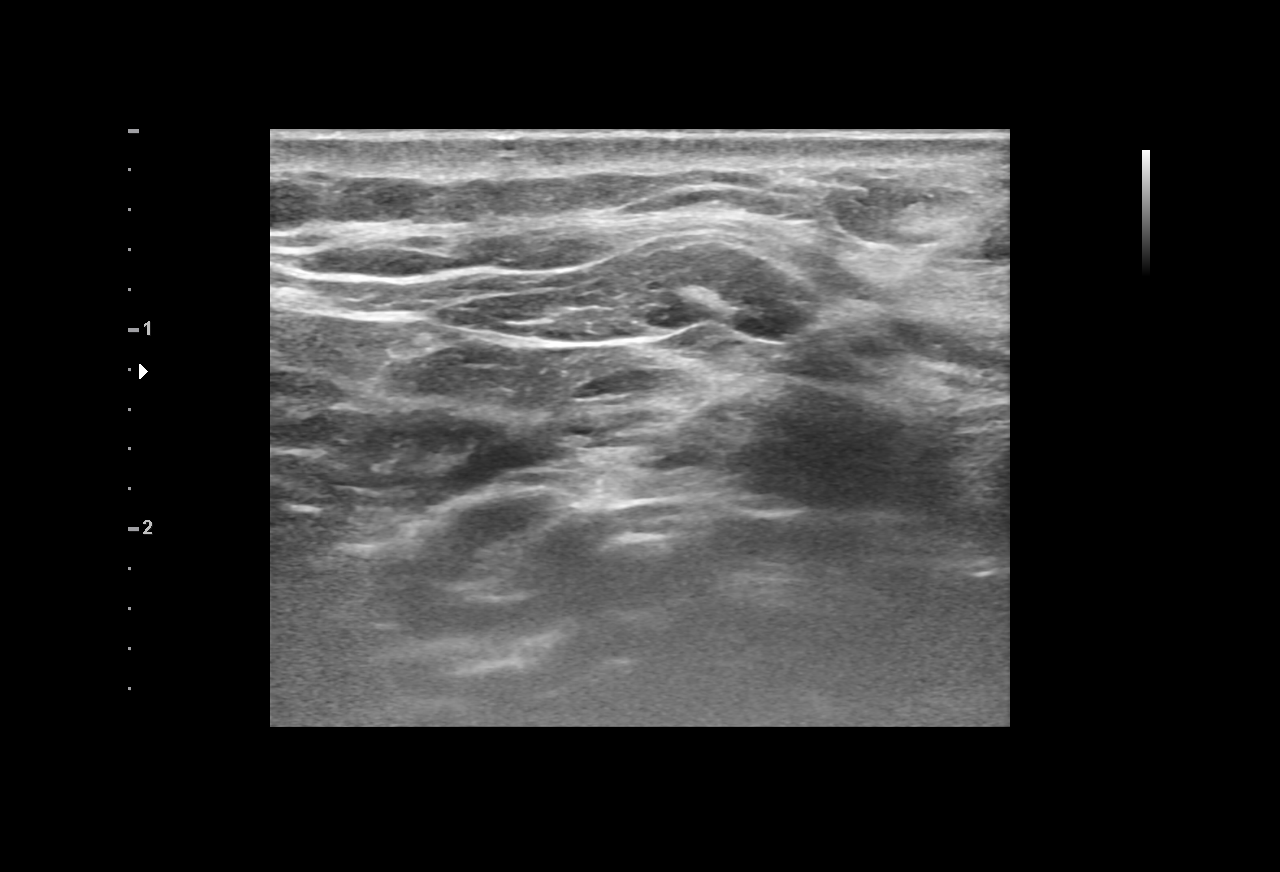

[2 of 2 positions shown; findings below may reference images not displayed]

EXAM:
IMPLANTED PORT A CATH PLACEMENT WITH ULTRASOUND AND FLUOROSCOPIC
GUIDANCE

MEDICATIONS:
2 g Ancef; The antibiotic was administered within an appropriate
time interval prior to skin puncture.

ANESTHESIA/SEDATION:
Versed 2 mg IV; Fentanyl 50 mcg IV;

Moderate Sedation Time:  30 minutes

The patient was continuously monitored during the procedure by the
interventional radiology nurse under my direct supervision.

FLUOROSCOPY TIME:  0 minutes, 12 seconds (1 mGy)

COMPLICATIONS:
None immediate.

PROCEDURE:
The right neck and chest was prepped with chlorhexidine, and draped
in the usual sterile fashion using maximum barrier technique (cap
and mask, sterile gown, sterile gloves, large sterile sheet, hand
hygiene and cutaneous antiseptic). Local anesthesia was attained by
infiltration with 1% lidocaine with epinephrine.

Ultrasound demonstrated patency of the right internal jugular vein,
and this was documented with an image. Under real-time ultrasound
guidance, this vein was accessed with a 21 gauge micropuncture
needle and image documentation was performed. A small dermatotomy
was made at the access site with an 11 scalpel. A 0.018" wire was
advanced into the SVC and the access needle exchanged for a 4F
micropuncture vascular sheath. The 0.018" wire was then removed and
a 0.035" wire advanced into the IVC.



The venous access site was then serially dilated and a peel away
vascular sheath placed over the wire. The wire was removed and the
port catheter advanced into position under fluoroscopic guidance.
The catheter tip is positioned in the superior cavoatrial junction.
This was documented with a spot image. The portacatheter was then
tested and found to flush and aspirate well. The port was flushed
with saline followed by 100 units/mL heparinized saline.

The pocket was then closed in two layers using first subdermal
inverted interrupted absorbable sutures followed by a running
subcuticular suture. The epidermis was then sealed with Dermabond.
The dermatotomy at the venous access site was also closed with
Dermabond.
IMPRESSION: Successful placement of a right IJ approach Power Port with
ultrasound and fluoroscopic guidance. The catheter is ready for use.

## 2020-03-02 MED ORDER — CEFAZOLIN SODIUM-DEXTROSE 2-4 GM/100ML-% IV SOLN
INTRAVENOUS | Status: AC
  Administered 2020-03-02: 2 g via INTRAVENOUS
  Filled 2020-03-02: qty 100

## 2020-03-02 MED ORDER — SODIUM CHLORIDE 0.9 % IV SOLN: INTRAVENOUS | Status: DC

## 2020-03-02 MED ORDER — CEFAZOLIN SODIUM-DEXTROSE 2-4 GM/100ML-% IV SOLN: 2.0000 g | Freq: Once | INTRAVENOUS | Status: AC

## 2020-03-02 MED ORDER — MIDAZOLAM HCL 2 MG/2ML IJ SOLN
INTRAMUSCULAR | Status: AC | PRN
  Administered 2020-03-02 (×2): 1 mg via INTRAVENOUS

## 2020-03-02 MED ORDER — LIDOCAINE-EPINEPHRINE 1 %-1:100000 IJ SOLN
INTRAMUSCULAR | Status: AC
  Filled 2020-03-02: qty 1

## 2020-03-02 MED ORDER — FENTANYL CITRATE (PF) 100 MCG/2ML IJ SOLN
INTRAMUSCULAR | Status: AC | PRN
  Administered 2020-03-02: 50 ug via INTRAVENOUS

## 2020-03-02 MED ORDER — FENTANYL CITRATE (PF) 100 MCG/2ML IJ SOLN
INTRAMUSCULAR | Status: AC
  Filled 2020-03-02: qty 2

## 2020-03-02 MED ORDER — LIDOCAINE HCL 1 % IJ SOLN
INTRAMUSCULAR | Status: AC | PRN
  Administered 2020-03-02: 20 mL

## 2020-03-02 MED ORDER — MIDAZOLAM HCL 2 MG/2ML IJ SOLN
INTRAMUSCULAR | Status: AC
  Filled 2020-03-02: qty 2

## 2020-03-02 MED ORDER — HEPARIN SOD (PORK) LOCK FLUSH 100 UNIT/ML IV SOLN
INTRAVENOUS | Status: AC
  Filled 2020-03-02: qty 5

## 2020-03-02 NOTE — Discharge Instructions (Signed)
Moderate Conscious Sedation, Adult, Care After These instructions provide you with information about caring for yourself after your procedure. Your health care provider may also give you more specific instructions. Your treatment has been planned according to current medical practices, but problems sometimes occur. Call your health care provider if you have any problems or questions after your procedure. What can I expect after the procedure? After your procedure, it is common:  To feel sleepy for several hours.  To feel clumsy and have poor balance for several hours.  To have poor judgment for several hours.  To vomit if you eat too soon. Follow these instructions at home: For at least 24 hours after the procedure:   Do not: ? Participate in activities where you could fall or become injured. ? Drive. ? Use heavy machinery. ? Drink alcohol. ? Take sleeping pills or medicines that cause drowsiness. ? Make important decisions or sign legal documents. ? Take care of children on your own.  Rest. Eating and drinking  Follow the diet recommended by your health care provider.  If you vomit: ? Drink water, juice, or soup when you can drink without vomiting. ? Make sure you have little or no nausea before eating solid foods. General instructions  Have a responsible adult stay with you until you are awake and alert.  Take over-the-counter and prescription medicines only as told by your health care provider.  If you smoke, do not smoke without supervision.  Keep all follow-up visits as told by your health care provider. This is important. Contact a health care provider if:  You keep feeling nauseous or you keep vomiting.  You feel light-headed.  You develop a rash.  You have a fever. Get help right away if:  You have trouble breathing. This information is not intended to replace advice given to you by your health care provider. Make sure you discuss any questions you have  with your health care provider. Document Revised: 05/24/2017 Document Reviewed: 10/01/2015 Elsevier Patient Education  2020 [employer] Implanted Port Home Guide An implanted port is a device that is placed under the skin. It is usually placed in the chest. The device can be used to give IV medicine, to take blood, or for dialysis. You may have an implanted port if:  You need IV medicine that would be irritating to the small veins in your hands or arms.  You need IV medicines, such as antibiotics, for a long period of time.  You need IV nutrition for a long period of time.  You need dialysis. Having a port means that your health care provider will not need to use the veins in your arms for these procedures. You may have fewer limitations when using a port than you would if you used other types of long-term IVs, and you will likely be able to return to normal activities after your incision heals. An implanted port has two main parts:  Reservoir. The reservoir is the part where a needle is inserted to give medicines or draw blood. The reservoir is round. After it is placed, it appears as a small, raised area under your skin.  Catheter. The catheter is a thin, flexible tube that connects the reservoir to a vein. Medicine that is inserted into the reservoir goes into the catheter and then into the vein. How is my port accessed? To access your port:  A numbing cream may be placed on the skin over the port site.  Your health care provider   will put on a mask and sterile gloves.  The skin over your port will be cleaned carefully with a germ-killing soap and allowed to dry.  Your health care provider will gently pinch the port and insert a needle into it.  Your health care provider will check for a blood return to make sure the port is in the vein and is not clogged.  If your port needs to remain accessed to get medicine continuously (constant infusion), your health care provider will place  a clear bandage (dressing) over the needle site. The dressing and needle will need to be changed every week, or as told by your health care provider. What is flushing? Flushing helps keep the port from getting clogged. Follow instructions from your health care provider about how and when to flush the port. Ports are usually flushed with saline solution or a medicine called heparin. The need for flushing will depend on how the port is used:  If the port is only used from time to time to give medicines or draw blood, the port may need to be flushed: ? Before and after medicines have been given. ? Before and after blood has been drawn. ? As part of routine maintenance. Flushing may be recommended every 4-6 weeks.  If a constant infusion is running, the port may not need to be flushed.  Throw away any syringes in a disposal container that is meant for sharp items (sharps container). You can buy a sharps container from a pharmacy, or you can make one by using an empty hard plastic bottle with a cover. How long will my port stay implanted? The port can stay in for as long as your health care provider thinks it is needed. When it is time for the port to come out, a surgery will be done to remove it. The surgery will be similar to the procedure that was done to put the port in. Follow these instructions at home:   Flush your port as told by your health care provider.  If you need an infusion over several days, follow instructions from your health care provider about how to take care of your port site. Make sure you: ? Wash your hands with soap and water before you change your dressing. If soap and water are not available, use alcohol-based hand sanitizer. ? Change your dressing as told by your health care provider. ? Place any used dressings or infusion bags into a plastic bag. Throw that bag in the trash. ? Keep the dressing that covers the needle clean and dry. Do not get it wet. ? Do not use  scissors or sharp objects near the tube. ? Keep the tube clamped, unless it is being used.  Check your port site every day for signs of infection. Check for: ? Redness, swelling, or pain. ? Fluid or blood. ? Pus or a bad smell.  Protect the skin around the port site. ? Avoid wearing bra straps that rub or irritate the site. ? Protect the skin around your port from seat belts. Place a soft pad over your chest if needed.  Bathe or shower as told by your health care provider. The site may get wet as long as you are not actively receiving an infusion.  Return to your normal activities as told by your health care provider. Ask your health care provider what activities are safe for you.  Carry a medical alert card or wear a medical alert bracelet at all times. This will   let health care providers know that you have an implanted port in case of an emergency. Get help right away if:  You have redness, swelling, or pain at the port site.  You have fluid or blood coming from your port site.  You have pus or a bad smell coming from the port site.  You have a fever. Summary  Implanted ports are usually placed in the chest for long-term IV access.  Follow instructions from your health care provider about flushing the port and changing bandages (dressings).  Take care of the area around your port by avoiding clothing that puts pressure on the area, and by watching for signs of infection.  Protect the skin around your port from seat belts. Place a soft pad over your chest if needed.  Get help right away if you have a fever or you have redness, swelling, pain, drainage, or a bad smell at the port site. This information is not intended to replace advice given to you by your health care provider. Make sure you discuss any questions you have with your health care provider. Document Revised: 10/03/2018 Document Reviewed: 07/14/2016 Elsevier Patient Education  2020 Elsevier Inc.  

## 2020-03-02 NOTE — H&P (Signed)
Chief Complaint: Patient was seen in consultation today for port-a-catheter placement.   Referring Physician(s): Autumn Messing III  Supervising Physician: Jacqulynn Cadet  Patient Status: [facility] - Out-pt  History of Present Illness: Jill [name] is a 44 y.o. female with recently diagnosed right breast cancer. She presented to her PCP 01/19/20 with complaints of a painful right breast lump. A diagnostic mammogram performed 01/21/20 was positive for an irregular mass and a biopsy done 02/04/11 confirmed the diagnosis of breast cancer. She is planning to start chemotherapy soon.  Interventional Radiology has been asked to evaluate this patient for an image-guided port-a-catheter placement to facilitate her treatment plans.   Past Medical History:  Diagnosis Date  . Infected cyst of Bartholin's gland duct   . Medical history non-contributory     Past Surgical History:  Procedure Laterality Date  . NO PAST SURGERIES    . WISDOM TOOTH EXTRACTION      Allergies: Patient has no known allergies.  Medications: Prior to Admission medications   Medication Sig Start Date End Date Taking? Authorizing Provider  acetaminophen (TYLENOL) 500 MG tablet Take 500 mg by mouth every 6 (six) hours as needed for moderate pain or headache.   Yes [provider]  ibuprofen (ADVIL) 200 MG tablet Take 400 mg by mouth every 6 (six) hours as needed for headache or moderate pain.   Yes [provider]  tamoxifen (NOLVADEX) 20 MG tablet Take 1 tablet (20 mg total) by mouth daily. 02/12/20 03/13/20 Yes Magrinat, Virgie Dad, MD     Family History  Problem Relation Age of Onset  . Diabetes Maternal Grandmother   . Hypertension Mother     Social History   Socioeconomic History  . Marital status: Single    Spouse name: Not on file  . Number of children: 4  . Years of education: Not on file  . Highest education level: 6th grade  Occupational History  . Not on file    Tobacco Use  . Smoking status: Never Smoker  . Smokeless tobacco: Never Used  Vaping Use  . Vaping Use: Never used  Substance and Sexual Activity  . Alcohol use: Never  . Drug use: Never  . Sexual activity: Not Currently    Birth control/protection: I.U.D.  Other Topics Concern  . Not on file  Social History Narrative  . Not on file   Social Determinants of Health   Financial Resource Strain:   . Difficulty of Paying Living Expenses: Not on file  Food Insecurity:   . Worried About Charity fundraiser in the Last Year: Not on file  . Ran Out of Food in the Last Year: Not on file  Transportation Needs: No Transportation Needs  . Lack of Transportation (Medical): No  . Lack of Transportation (Non-Medical): No  Physical Activity:   . Days of Exercise per Week: Not on file  . Minutes of Exercise per Session: Not on file  Stress:   . Feeling of Stress : Not on file  Social Connections:   . Frequency of Communication with Friends and Family: Not on file  . Frequency of Social Gatherings with Friends and Family: Not on file  . Attends Religious Services: Not on file  . Active Member of Clubs or Organizations: Not on file  . Attends Archivist Meetings: Not on file  . Marital Status: Not on file    Review of Systems: A 12 point ROS discussed and pertinent positives are  indicated in the HPI above.  All other systems are negative.  Review of Systems  Constitutional: Negative for appetite change and fatigue.  Respiratory: Negative for cough and shortness of breath.   Cardiovascular: Negative for chest pain and leg swelling.  Gastrointestinal: Negative for abdominal pain, diarrhea, nausea and vomiting.  Musculoskeletal: Negative for back pain.  Neurological: Negative for headaches.    Vital Signs: BP 116/65   Pulse 71   Temp 98.2 F (36.8 C) (Oral)   Ht 4' 10.66" (1.49 m)   Wt 152 lb (68.9 kg)   LMP 02/17/2020   SpO2 100%   BMI 31.06 kg/m   Physical  Exam Constitutional:      General: She is not in acute distress. HENT:     Mouth/Throat:     Mouth: Mucous membranes are moist.     Pharynx: Oropharynx is clear.  Cardiovascular:     Rate and Rhythm: Normal rate and regular rhythm.     Pulses: Normal pulses.     Heart sounds: Normal heart sounds.  Pulmonary:     Effort: Pulmonary effort is normal.     Breath sounds: Normal breath sounds.  Abdominal:     General: Bowel sounds are normal.     Palpations: Abdomen is soft.  Musculoskeletal:        General: Normal range of motion.  Skin:    General: Skin is warm and dry.  Neurological:     Mental Status: She is alert and oriented to person, place, and time.     Imaging: MM CLIP PLACEMENT RIGHT  Result Date: 02/04/2020 CLINICAL DATA:  Evaluate biopsy marker EXAM: DIAGNOSTIC RIGHT MAMMOGRAM POST ULTRASOUND BIOPSY COMPARISON:  Previous exam(s). FINDINGS: Mammographic images were obtained following ultrasound guided biopsy of a right breast mass. The biopsy marking clip is in expected position at the site of biopsy. IMPRESSION: Appropriate positioning of the ribbon shaped biopsy marking clip at the site of biopsy in the biopsied right breast mass. 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 This was found to be concordant by Dr. Dorise Bullion. Pathology results were discussed with the patient by telephone with Lattie Corns, Bilingual Patient Services Representative. The patient reported doing well after the biopsy with tenderness at the site. Post biopsy instructions and care were reviewed and questions were  answered. The patient was encouraged to call [facility] for any additional concerns. Surgical consultation has been arranged with Dr. Autumn Messing at [facility] on February 09, 2020. Consider bilateral breast MRI given breast density and extent of associated calcifications. If needed clinically, stereotactic guided core needle biopsy of either the anterior or posterior margin can be obtained as needed for surgical planning. Pathology results reported by Stacie Acres RN on 02/05/2020. Electronically Signed   By: Dorise Bullion III M.D   On: 02/05/2020 13:05   Result Date: 02/08/2020 CLINICAL DATA:  Biopsy of a right breast mass EXAM: ULTRASOUND GUIDED RIGHT BREAST CORE NEEDLE BIOPSY COMPARISON:  Previous exam(s). PROCEDURE: I met with the patient and we discussed the procedure of ultrasound-guided biopsy, including benefits and alternatives. We  discussed the high likelihood of a successful procedure. We discussed the risks of the procedure, including infection, bleeding, tissue injury, clip migration, and inadequate sampling. Informed written consent was given. The usual time-out protocol was performed immediately prior to the procedure. Lesion quadrant: 930, 5 cm from the nipple Using sterile technique and 1% Lidocaine as local anesthetic, under direct ultrasound visualization, a 12 gauge spring-loaded device was used to perform biopsy of a right breast mass at 9:30, 5 cm from the nipple using a lateral approach. At the conclusion of the procedure ribbon shaped tissue marker clip was deployed into the biopsy cavity. Follow up 2 view mammogram was performed and dictated separately. IMPRESSION: Ultrasound guided biopsy of a right breast mass. No apparent complications. Electronically Signed: By: Dorise Bullion III M.D On: 02/04/2020 14:36    Labs:  CBC: Recent Labs    03/15/19 1421 12/15/19 1543 02/12/20 1219 02/22/20 1353  WBC 6.3 7.4 5.7 5.0  HGB 13.7 14.4 13.6  13.9  HCT 39.1 42.4 39.5 41.1  PLT 304 395 429* 382    COAGS: No results for input(s): INR, APTT in the last 8760 hours.  BMP: Recent Labs    12/15/19 1543 02/12/20 1219 02/22/20 1353  NA 138 138 139  K 4.1 3.7 4.2  CL 103 105 107  CO2 _0 GLUCOSE 93 157* 100*  BUN _1 CALCIUM 9.6 9.6 9.7  CREATININE 0.57 0.79 0.77  GFRNONAA 115 >60 >60  GFRAA 132 >60 >60    LIVER FUNCTION TESTS: Recent Labs    12/15/19 1543 02/12/20 1219 02/22/20 1353  BILITOT 0.3 0.6 0.3  AST 34 15 13*  ALT _2 ALKPHOS 80 71 65  PROT 7.3 7.1 7.0  ALBUMIN 4.5 4.2 4.1    TUMOR MARKERS: No results for input(s): AFPTM, CEA, CA199, CHROMGRNA in the last 8760 hours.  Assessment and Plan:  Right breast cancer: Rowland Lathe, 44 year old female, presents today to the [facility] department for an image-guided port-a-catheter placement.   Risks and benefits of an image-guided Port-a-catheter placement were discussed with the patient including, but not limited to bleeding, infection, pneumothorax, or fibrin sheath development and need for additional procedures.  All of the patient's questions were answered, patient is agreeable to proceed.  The patient has been NPO. Vitals have been reviewed. She does not take any blood-thinning medications.   Consent signed and in chart.  Thank you for this interesting consult.  I greatly enjoyed meeting [place] and look forward to participating in their care.  A copy of this report was sent to the requesting provider on this date.  Electronically Signed: Soyla Dryer, AGACNP-BC [phone] 03/02/2020, 8:12 AM   I spent a total of  30 Minutes   in face to face in clinical consultation, greater than 50% of which was counseling/coordinating care for port-a-catheter placement.

## 2020-03-02 NOTE — Procedures (Signed)
Interventional Radiology Procedure Note  Procedure: Placement of a left IJ approach single lumen PowerPort.  Tip is positioned at the superior cavoatrial junction and catheter is ready for immediate use.  Complications: No immediate Recommendations:  - Ok to shower tomorrow - Do not submerge for 7 days - Routine line care   Signed,  Berdie Malter K. Lequan Dobratz, MD   

## 2020-03-02 NOTE — Progress Notes (Signed)

## 2020-03-03 ENCOUNTER — Inpatient Hospital Stay (HOSPITAL_COMMUNITY): Admission: RE | Admit: 2020-03-03 | Payer: No Typology Code available for payment source | Source: Ambulatory Visit

## 2020-03-04 ENCOUNTER — Ambulatory Visit: Payer: Self-pay | Admitting: Genetic Counselor

## 2020-03-04 ENCOUNTER — Encounter: Payer: Self-pay | Admitting: Genetic Counselor

## 2020-03-04 ENCOUNTER — Telehealth: Payer: Self-pay | Admitting: Genetic Counselor

## 2020-03-04 DIAGNOSIS — Z1379 Encounter for other screening for genetic and chromosomal anomalies: Secondary | ICD-10-CM

## 2020-03-04 DIAGNOSIS — Z170 Estrogen receptor positive status [ER+]: Secondary | ICD-10-CM

## 2020-03-04 NOTE — Progress Notes (Signed)
Spoke with patient, states she is going for covid test Saturday 03/05/20.

## 2020-03-04 NOTE — Progress Notes (Addendum)
HPI:  Jill [name] was previously seen in the [facility] due to a personal history of breast cancer and concerns regarding a hereditary predisposition to cancer. Please refer to our prior cancer genetics clinic note for more information regarding our discussion, assessment and recommendations, at the time. Jill [name]'s recent genetic test results were disclosed to her, as were recommendations warranted by these results with the help of a Spanish interpreter [employer] [phone]). These results and recommendations are discussed in more detail below.  CANCER HISTORY:  Oncology History  Malignant neoplasm of upper-outer quadrant of right breast in female, estrogen receptor positive ([place])  02/04/2020 Cancer Staging   Staging form: Breast, AJCC 8th Edition - Clinical stage from 02/04/2020: Stage IB (cT2, cN0, cM0, G3, ER+, PR+, HER2+) - Signed by Gardenia Phlegm, NP on 02/10/2020   02/10/2020 Initial Diagnosis   Malignant neoplasm of upper-outer quadrant of right breast in female, estrogen receptor positive ([place])   03/02/2020 Genetic Testing   No pathogenic variants detected in [facility] Common Hereditary Cancers Panel.  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 The Common Hereditary Cancers Panel offered by [facility] includes sequencing and/or deletion duplication testing of the following 48 genes: APC, ATM, AXIN2, BARD1, BMPR1A, BRCA1, BRCA2, BRIP1, CDH1, CDK4, CDKN2A (p14ARF), CDKN2A (p16INK4a), CHEK2, CTNNA1, DICER1, EPCAM (Deletion/duplication testing only), GREM1 (promoter region deletion/duplication testing only), KIT, MEN1, MLH1, MSH2, MSH3, MSH6, MUTYH, NBN, NF1, NHTL1, PALB2, PDGFRA, PMS2, POLD1, POLE, PTEN, RAD50, RAD51C, RAD51D, RNF43, SDHB, SDHC, SDHD, SMAD4, SMARCA4. STK11, TP53, TSC1, TSC2, and VHL.  The following genes were evaluated for sequence changes only: SDHA and HOXB13 c.251G>A variant only. The report date  is March 02, 2020.    03/22/2020 -  Chemotherapy   The patient had DEXAMETHASONE 4 MG PO TABS, 8 mg, Oral, 2 times daily, 0 of 1 cycle, Start date: --, End date: -- PALONOSETRON HCL INJECTION 0.25 MG/5ML, 0.25 mg, Intravenous,  Once, 0 of 6 cycles PEGFILGRASTIM-JMDB 6 MG/0.6ML [place] SOSY, 6 mg, Subcutaneous,  Once, 0 of 6 cycles CARBOPLATIN CHEMO IV INFUSION ORDERABLE (BY AUC), , Intravenous,  Once, 0 of 6 cycles DOCETAXEL CHEMO IV INFUSION ORDERABLE, 75 mg/m2, Intravenous,  Once, 0 of 6 cycles FOSAPREPITANT IV INFUSION 150 MG, 150 mg, Intravenous,  Once, 0 of 6 cycles PERTUZUMAB CHEMO IV INFUSION, 840 mg, Intravenous, Once, 0 of 17 cycles TRASTUZUMAB-DKST CHEMO IV INFUSION, 8 mg/kg, Intravenous,  Once, 0 of 17 cycles  for chemotherapy treatment.      FAMILY HISTORY:  We obtained a detailed, 4-generation family history.  Significant diagnoses are listed below:         Jill [name] has two daughters and two sons, all without a cancer history.  She has three brothers, who are in their 18s, and two sisters, who are in their 76s.  No cancer history was reported in her brothers, sisters, nieces, or nephews.  Jill [name]'s mother is living at age 22.  Jill [name]'s father is living at age 39.  No maternal or paternal family history of cancer was reported.    Jill [name] is unaware of previous family history of genetic testing for hereditary cancer risks. Patient's maternal ancestors are of [place] descent, and paternal ancestors are of [place] descent. There is no reported Ashkenazi Jewish ancestry. There is known consanguinity.  Jill [name]'s parents are first cousins once removed.  Her mother's maternal grandmother  and her father's mother are sisters.   GENETIC TEST RESULTS: Genetic testing reported out on March 02, 2020.  The Common Hereditary Cancers Panel found no pathogenic mutations. The Common Hereditary Cancers Panel offered by  [facility] includes sequencing and/or deletion duplication testing of the following 48 genes: APC, ATM, AXIN2, BARD1, BMPR1A, BRCA1, BRCA2, BRIP1, CDH1, CDK4, CDKN2A (p14ARF), CDKN2A (p16INK4a), CHEK2, CTNNA1, DICER1, EPCAM (Deletion/duplication testing only), GREM1 (promoter region deletion/duplication testing only), KIT, MEN1, MLH1, MSH2, MSH3, MSH6, MUTYH, NBN, NF1, NHTL1, PALB2, PDGFRA, PMS2, POLD1, POLE, PTEN, RAD50, RAD51C, RAD51D, RNF43, SDHB, SDHC, SDHD, SMAD4, SMARCA4. STK11, TP53, TSC1, TSC2, and VHL.  The following genes were evaluated for sequence changes only: SDHA and HOXB13 c.251G>A variant only. The test report has been scanned into EPIC and is located under the Molecular Pathology section of the Results Review tab.  A portion of the result report is included below for reference.       We discussed with Jill [name] that because current genetic testing is not perfect, it is possible there may be a gene mutation in one of these genes that current testing cannot detect, but that chance is small.  We also discussed, that there could be another gene that has not yet been discovered, or that we have not yet tested, that is responsible for the cancer diagnoses in the family. It is also possible there is a hereditary cause for the cancer in the family that Ms. Rosa did not inherit and therefore was not identified in her testing.  Therefore, it is important to remain in touch with cancer genetics in the future so that we can continue to offer Jill [name] the most up to date genetic testing.   Genetic testing did identify a variant of uncertain significance (VUS) was identified in the [place] gene called c.3380T>G (p.Ile1127Ser).  At this time, it is unknown if this variant is associated with increased cancer risk or if this is a normal finding, but most variants such as this get reclassified to being inconsequential. It should not be used to make medical management decisions.  With time, we suspect the lab will determine the significance of this variant, if any. If we do learn more about it, we will try to contact Jill [name] to discuss it further. However, it is important to stay in touch with [place] periodically and keep the address and phone number up to date.   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  The change in variant classification was made as a result of re-review of evidence in light of new variant interpretation guidelines and/or new information. The amended report date is April 06, 2021.    ADDITIONAL GENETIC TESTING: We discussed with Ms. [place] that there are other genes that are associated with increased cancer risk that can be analyzed. Should Ms. Kathi Simpers [place] wish to pursue additional genetic testing, we are happy to discuss and coordinate this testing, at any time.    CANCER SCREENING RECOMMENDATIONS: Jill [name]'s test result is considered negative (normal).  This means that we have not identified a hereditary cause for her personal history of breast cancer at this time. Most cancers happen by chance and this negative test suggests that her cancer may fall into this category.    While reassuring, this does not definitively rule out a hereditary predisposition to cancer. It is still possible  that there could be genetic mutations that are undetectable by current technology. There could be genetic mutations in genes that have not been tested or identified to increase cancer risk.  Therefore, it is recommended she continue to follow the cancer management and screening guidelines provided by her oncology and primary healthcare provider.   An individual's cancer risk and medical management are not determined by genetic test results alone. Overall cancer risk assessment incorporates additional factors, including personal medical history, family  history, and any available genetic information that may result in a personalized plan for cancer prevention and surveillance   RECOMMENDATIONS FOR FAMILY MEMBERS:  Individuals in this family might be at some increased risk of developing cancer, over the general population risk, simply due to the family history of cancer.  We recommended women in this family have a yearly mammogram beginning at age 74, or 25 years younger than the earliest onset of cancer, an annual clinical breast exam, and perform monthly breast self-exams. Women in this family should also have a gynecological exam as recommended by their primary provider. All family members should be referred for colonoscopy starting at age 37.  FOLLOW-UP: Lastly, we discussed with Ms. Christiana that cancer genetics is a rapidly advancing field and it is possible that new genetic tests will be appropriate for her and/or her family members in the future. We encouraged her to remain in contact with cancer genetics on an annual basis so we can update her personal and family histories and let her know of advances in cancer genetics that may benefit this family.   Our contact number was provided. Jill [name]'s questions were answered to her satisfaction, and she knows she is welcome to call us at anytime with additional questions or concerns.   Aniyah Nobis M. Joette Catching, Dri[place] [name], [facility] Certified Film/video [email or web address] (P) [phone]

## 2020-03-04 NOTE — Telephone Encounter (Signed)
Revealed negative genetic testing and variant of uncertain significance in DICER1.  Discussed that we do not know why she has breast cancer. It could be due to a different gene that we are not testing, or maybe our current technology may not be able to pick something up.  It will be important for her to keep in contact with genetics to keep up with whether additional testing may be needed.

## 2020-03-05 ENCOUNTER — Other Ambulatory Visit (HOSPITAL_COMMUNITY)
Admission: RE | Admit: 2020-03-05 | Discharge: 2020-03-05 | Disposition: A | Discharge status: Home / Self Care | Payer: No Typology Code available for payment source | Source: Ambulatory Visit | Attending: General Surgery | Admitting: General Surgery

## 2020-03-05 DIAGNOSIS — Z01812 Encounter for preprocedural laboratory examination: Secondary | ICD-10-CM | POA: Insufficient documentation

## 2020-03-05 DIAGNOSIS — Z20822 Contact with and (suspected) exposure to covid-19: Secondary | ICD-10-CM | POA: Insufficient documentation

## 2020-03-05 LAB — SARS CORONAVIRUS 2 (TAT 6-24 HRS): SARS Coronavirus 2: NEGATIVE

## 2020-03-06 NOTE — Anesthesia Preprocedure Evaluation (Addendum)
Anesthesia Evaluation  Patient identified by MRN, date of birth, ID band Patient awake    Reviewed: Allergy & Precautions, NPO status , Patient's Chart, lab work & pertinent test results  Airway Mallampati: II  TM Distance: >3 FB Neck ROM: Full    Dental no notable dental hx. (+) Teeth Intact, Dental Advisory Given   Pulmonary neg pulmonary ROS,    Pulmonary exam normal breath sounds clear to auscultation       Cardiovascular Exercise Tolerance: Good Normal cardiovascular exam Rhythm:Regular Rate:Normal     Neuro/Psych negative neurological ROS     GI/Hepatic negative GI ROS, Neg liver ROS,   Endo/Other  negative endocrine ROS  Renal/GU negative Renal ROS     Musculoskeletal negative musculoskeletal ROS (+)   Abdominal   Peds  Hematology negative hematology ROS (+)   Anesthesia Other Findings Breast CA  Reproductive/Obstetrics negative OB ROS                            Anesthesia Physical Anesthesia Plan  ASA: II  Anesthesia Plan: General   Post-op Pain Management:    Induction: Intravenous  PONV Risk Score and Plan: 4 or greater and Treatment may vary due to age or medical condition, Ondansetron, Dexamethasone and Midazolam  Airway Management Planned: LMA  Additional Equipment: None  Intra-op Plan:   Post-operative Plan:   Informed Consent: I have reviewed the patients History and Physical, chart, labs and discussed the procedure including the risks, benefits and alternatives for the proposed anesthesia with the patient or authorized representative who has indicated his/her understanding and acceptance.     Dental advisory given and Interpreter used for interveiw  Plan Discussed with: CRNA and Anesthesiologist  Anesthesia Plan Comments:        Anesthesia Quick Evaluation

## 2020-03-07 ENCOUNTER — Encounter (HOSPITAL_BASED_OUTPATIENT_CLINIC_OR_DEPARTMENT_OTHER)
Admission: RE | Disposition: A | Discharge status: Home / Self Care | Payer: Self-pay | Source: Home / Self Care | Attending: General Surgery

## 2020-03-07 ENCOUNTER — Other Ambulatory Visit (HOSPITAL_COMMUNITY): Payer: Self-pay | Admitting: General Surgery

## 2020-03-07 ENCOUNTER — Ambulatory Visit (HOSPITAL_BASED_OUTPATIENT_CLINIC_OR_DEPARTMENT_OTHER): Payer: No Typology Code available for payment source | Admitting: Anesthesiology

## 2020-03-07 ENCOUNTER — Encounter (HOSPITAL_BASED_OUTPATIENT_CLINIC_OR_DEPARTMENT_OTHER): Payer: Self-pay | Admitting: General Surgery

## 2020-03-07 ENCOUNTER — Ambulatory Visit (HOSPITAL_BASED_OUTPATIENT_CLINIC_OR_DEPARTMENT_OTHER)
Admission: RE | Admit: 2020-03-07 | Discharge: 2020-03-07 | Disposition: A | Discharge status: Home / Self Care | Payer: No Typology Code available for payment source | Attending: General Surgery | Admitting: General Surgery

## 2020-03-07 ENCOUNTER — Encounter (HOSPITAL_COMMUNITY)
Admission: RE | Admit: 2020-03-07 | Discharge: 2020-03-07 | Disposition: A | Discharge status: Home / Self Care | Payer: No Typology Code available for payment source | Source: Ambulatory Visit | Attending: General Surgery | Admitting: General Surgery

## 2020-03-07 ENCOUNTER — Other Ambulatory Visit: Payer: Self-pay

## 2020-03-07 DIAGNOSIS — C50919 Malignant neoplasm of unspecified site of unspecified female breast: Secondary | ICD-10-CM

## 2020-03-07 DIAGNOSIS — C50411 Malignant neoplasm of upper-outer quadrant of right female breast: Secondary | ICD-10-CM

## 2020-03-07 DIAGNOSIS — Z170 Estrogen receptor positive status [ER+]: Secondary | ICD-10-CM | POA: Insufficient documentation

## 2020-03-07 DIAGNOSIS — C50911 Malignant neoplasm of unspecified site of right female breast: Secondary | ICD-10-CM | POA: Diagnosis present

## 2020-03-07 DIAGNOSIS — C773 Secondary and unspecified malignant neoplasm of axilla and upper limb lymph nodes: Secondary | ICD-10-CM | POA: Insufficient documentation

## 2020-03-07 HISTORY — PX: MASTECTOMY W/ SENTINEL NODE BIOPSY: SHX2001

## 2020-03-07 HISTORY — DX: Malignant neoplasm of unspecified site of unspecified female breast: C50.919

## 2020-03-07 LAB — POCT PREGNANCY, URINE: Preg Test, Ur: NEGATIVE

## 2020-03-07 SURGERY — MASTECTOMY WITH SENTINEL LYMPH NODE BIOPSY
Anesthesia: General | Site: Breast | Laterality: Right

## 2020-03-07 MED ORDER — MIDAZOLAM HCL 2 MG/2ML IJ SOLN
INTRAMUSCULAR | Status: AC
  Filled 2020-03-07: qty 2

## 2020-03-07 MED ORDER — LIDOCAINE 2% (20 MG/ML) 5 ML SYRINGE
INTRAMUSCULAR | Status: AC
  Filled 2020-03-07: qty 5

## 2020-03-07 MED ORDER — CEFAZOLIN SODIUM-DEXTROSE 2-4 GM/100ML-% IV SOLN
2.0000 g | INTRAVENOUS | Status: AC
  Administered 2020-03-07: 2 g via INTRAVENOUS

## 2020-03-07 MED ORDER — CHLORHEXIDINE GLUCONATE CLOTH 2 % EX PADS: 6.0000 | MEDICATED_PAD | Freq: Once | CUTANEOUS | Status: DC

## 2020-03-07 MED ORDER — FENTANYL CITRATE (PF) 100 MCG/2ML IJ SOLN
INTRAMUSCULAR | Status: AC
  Filled 2020-03-07: qty 2

## 2020-03-07 MED ORDER — METHOCARBAMOL 500 MG PO TABS
500.0000 mg | ORAL_TABLET | Freq: Four times a day (QID) | ORAL | Status: DC | PRN
  Administered 2020-03-07: 500 mg via ORAL
  Filled 2020-03-07: qty 1

## 2020-03-07 MED ORDER — LIDOCAINE HCL (CARDIAC) PF 100 MG/5ML IV SOSY
PREFILLED_SYRINGE | INTRAVENOUS | Status: DC | PRN
  Administered 2020-03-07: 100 mg via INTRATRACHEAL

## 2020-03-07 MED ORDER — OXYCODONE HCL 5 MG PO TABS: 5.0000 mg | ORAL_TABLET | Freq: Once | ORAL | Status: DC | PRN

## 2020-03-07 MED ORDER — CEFAZOLIN SODIUM-DEXTROSE 2-4 GM/100ML-% IV SOLN
INTRAVENOUS | Status: AC
  Filled 2020-03-07: qty 100

## 2020-03-07 MED ORDER — AMISULPRIDE (ANTIEMETIC) 5 MG/2ML IV SOLN: 10.0000 mg | Freq: Once | INTRAVENOUS | Status: DC | PRN

## 2020-03-07 MED ORDER — ACETAMINOPHEN 10 MG/ML IV SOLN: 1000.0000 mg | Freq: Once | INTRAVENOUS | Status: DC | PRN

## 2020-03-07 MED ORDER — PROPOFOL 10 MG/ML IV BOLUS
INTRAVENOUS | Status: AC
  Filled 2020-03-07: qty 20

## 2020-03-07 MED ORDER — CELECOXIB 200 MG PO CAPS
ORAL_CAPSULE | ORAL | Status: AC
  Filled 2020-03-07: qty 1

## 2020-03-07 MED ORDER — BUPIVACAINE HCL (PF) 0.25 % IJ SOLN
INTRAMUSCULAR | Status: AC
  Filled 2020-03-07: qty 30

## 2020-03-07 MED ORDER — SODIUM CHLORIDE (PF) 0.9 % IJ SOLN
INTRAMUSCULAR | Status: AC
  Filled 2020-03-07: qty 10

## 2020-03-07 MED ORDER — ONDANSETRON HCL 4 MG/2ML IJ SOLN
INTRAMUSCULAR | Status: AC
  Filled 2020-03-07: qty 2

## 2020-03-07 MED ORDER — GABAPENTIN 300 MG PO CAPS
ORAL_CAPSULE | ORAL | Status: AC
  Filled 2020-03-07: qty 1

## 2020-03-07 MED ORDER — METHOCARBAMOL 750 MG PO TABS: 750.0000 mg | ORAL_TABLET | Freq: Four times a day (QID) | ORAL | 2 refills | Status: DC | PRN

## 2020-03-07 MED ORDER — FENTANYL CITRATE (PF) 100 MCG/2ML IJ SOLN
100.0000 ug | Freq: Once | INTRAMUSCULAR | Status: AC
  Administered 2020-03-07: 50 ug via INTRAVENOUS

## 2020-03-07 MED ORDER — METHYLENE BLUE 0.5 % INJ SOLN
INTRAVENOUS | Status: AC
  Filled 2020-03-07: qty 10

## 2020-03-07 MED ORDER — ONDANSETRON 4 MG PO TBDP: 4.0000 mg | ORAL_TABLET | Freq: Four times a day (QID) | ORAL | Status: DC | PRN

## 2020-03-07 MED ORDER — PROPOFOL 10 MG/ML IV BOLUS
INTRAVENOUS | Status: DC | PRN
  Administered 2020-03-07: 130 mg via INTRAVENOUS

## 2020-03-07 MED ORDER — MORPHINE SULFATE (PF) 4 MG/ML IV SOLN: 1.0000 mg | INTRAVENOUS | Status: DC | PRN

## 2020-03-07 MED ORDER — HYDROCODONE-ACETAMINOPHEN 5-325 MG PO TABS: 1.0000 | ORAL_TABLET | ORAL | Status: DC | PRN

## 2020-03-07 MED ORDER — GABAPENTIN 300 MG PO CAPS
300.0000 mg | ORAL_CAPSULE | ORAL | Status: AC
  Administered 2020-03-07: 300 mg via ORAL

## 2020-03-07 MED ORDER — DEXAMETHASONE SODIUM PHOSPHATE 10 MG/ML IJ SOLN
INTRAMUSCULAR | Status: DC | PRN
  Administered 2020-03-07: 10 mg via INTRAVENOUS

## 2020-03-07 MED ORDER — ONDANSETRON HCL 4 MG/2ML IJ SOLN: 4.0000 mg | Freq: Four times a day (QID) | INTRAMUSCULAR | Status: DC | PRN

## 2020-03-07 MED ORDER — HYDROMORPHONE HCL 1 MG/ML IJ SOLN
0.2500 mg | INTRAMUSCULAR | Status: DC | PRN
  Administered 2020-03-07 (×2): 0.5 mg via INTRAVENOUS

## 2020-03-07 MED ORDER — TAMOXIFEN CITRATE 20 MG PO TABS: 20.0000 mg | ORAL_TABLET | Freq: Every day | ORAL | Status: DC

## 2020-03-07 MED ORDER — KCL IN DEXTROSE-NACL 20-5-0.9 MEQ/L-%-% IV SOLN
INTRAVENOUS | Status: DC
  Filled 2020-03-07: qty 1000

## 2020-03-07 MED ORDER — LACTATED RINGERS IV SOLN: INTRAVENOUS | Status: DC

## 2020-03-07 MED ORDER — MEPERIDINE HCL 25 MG/ML IJ SOLN: 6.2500 mg | INTRAMUSCULAR | Status: DC | PRN

## 2020-03-07 MED ORDER — CELECOXIB 200 MG PO CAPS
200.0000 mg | ORAL_CAPSULE | ORAL | Status: AC
  Administered 2020-03-07: 200 mg via ORAL

## 2020-03-07 MED ORDER — TECHNETIUM TC 99M SULFUR COLLOID FILTERED
1.0000 | Freq: Once | INTRAVENOUS | Status: AC | PRN
  Administered 2020-03-07: 1 via INTRADERMAL

## 2020-03-07 MED ORDER — PANTOPRAZOLE SODIUM 40 MG IV SOLR: 40.0000 mg | Freq: Every day | INTRAVENOUS | Status: DC

## 2020-03-07 MED ORDER — OXYCODONE HCL 5 MG/5ML PO SOLN: 5.0000 mg | Freq: Once | ORAL | Status: DC | PRN

## 2020-03-07 MED ORDER — HYDROCODONE-ACETAMINOPHEN 5-325 MG PO TABS: 1.0000 | ORAL_TABLET | Freq: Four times a day (QID) | ORAL | 0 refills | Status: DC | PRN

## 2020-03-07 MED ORDER — MIDAZOLAM HCL 5 MG/5ML IJ SOLN
INTRAMUSCULAR | Status: DC | PRN
  Administered 2020-03-07: 2 mg via INTRAVENOUS

## 2020-03-07 MED ORDER — 0.9 % SODIUM CHLORIDE (POUR BTL) OPTIME
TOPICAL | Status: DC | PRN
  Administered 2020-03-07: 700 mL

## 2020-03-07 MED ORDER — ONDANSETRON HCL 4 MG/2ML IJ SOLN
INTRAMUSCULAR | Status: DC | PRN
  Administered 2020-03-07: 4 mg via INTRAVENOUS

## 2020-03-07 MED ORDER — HEPARIN SODIUM (PORCINE) 5000 UNIT/ML IJ SOLN: 5000.0000 [IU] | Freq: Three times a day (TID) | INTRAMUSCULAR | Status: DC

## 2020-03-07 MED ORDER — FENTANYL CITRATE (PF) 100 MCG/2ML IJ SOLN
INTRAMUSCULAR | Status: DC | PRN
Start: 2020-03-07 — End: 2020-03-07
  Administered 2020-03-07: 50 ug via INTRAVENOUS
  Administered 2020-03-07 (×2): 25 ug via INTRAVENOUS

## 2020-03-07 MED ORDER — BUPIVACAINE HCL (PF) 0.25 % IJ SOLN
INTRAMUSCULAR | Status: DC | PRN
  Administered 2020-03-07: 20 mL

## 2020-03-07 MED ORDER — ACETAMINOPHEN 500 MG PO TABS
1000.0000 mg | ORAL_TABLET | ORAL | Status: AC
  Administered 2020-03-07: 1000 mg via ORAL

## 2020-03-07 MED ORDER — MIDAZOLAM HCL 2 MG/2ML IJ SOLN
2.0000 mg | Freq: Once | INTRAMUSCULAR | Status: AC
  Administered 2020-03-07: 1 mg via INTRAVENOUS

## 2020-03-07 MED ORDER — ONDANSETRON HCL 4 MG/2ML IJ SOLN: 4.0000 mg | Freq: Once | INTRAMUSCULAR | Status: DC | PRN

## 2020-03-07 MED ORDER — ACETAMINOPHEN 500 MG PO TABS
ORAL_TABLET | ORAL | Status: AC
  Filled 2020-03-07: qty 2

## 2020-03-07 MED ORDER — HYDROMORPHONE HCL 1 MG/ML IJ SOLN
INTRAMUSCULAR | Status: AC
  Filled 2020-03-07: qty 0.5

## 2020-03-07 SURGICAL SUPPLY — 63 items
ADH SKN CLS APL DERMABOND .7 (GAUZE/BANDAGES/DRESSINGS) ×1
APL PRP STRL LF DISP 70% ISPRP (MISCELLANEOUS) ×1
APPLIER CLIP 9.375 MED OPEN (MISCELLANEOUS) ×6
APR CLP MED 9.3 20 MLT OPN (MISCELLANEOUS) ×2
BINDER BREAST LRG (GAUZE/BANDAGES/DRESSINGS) ×3 IMPLANT
BINDER BREAST XLRG (GAUZE/BANDAGES/DRESSINGS) IMPLANT
BINDER BREAST XXLRG (GAUZE/BANDAGES/DRESSINGS) IMPLANT
BIOPATCH RED 1 DISK 7.0 (GAUZE/BANDAGES/DRESSINGS) ×2 IMPLANT
BIOPATCH RED 1IN DISK 7.0MM (GAUZE/BANDAGES/DRESSINGS) ×1
BLADE SURG 10 STRL SS (BLADE) ×3 IMPLANT
BLADE SURG 15 STRL LF DISP TIS (BLADE) ×1 IMPLANT
BLADE SURG 15 STRL SS (BLADE) ×3
CANISTER SUCT 1200ML W/VALVE (MISCELLANEOUS) ×3 IMPLANT
CHLORAPREP W/TINT 26 (MISCELLANEOUS) ×3 IMPLANT
CLIP APPLIE 9.375 MED OPEN (MISCELLANEOUS) ×2 IMPLANT
COVER BACK TABLE 60X90IN (DRAPES) ×3 IMPLANT
COVER MAYO STAND STRL (DRAPES) ×3 IMPLANT
COVER PROBE W GEL 5X96 (DRAPES) IMPLANT
COVER WAND RF STERILE (DRAPES) IMPLANT
DECANTER SPIKE VIAL GLASS SM (MISCELLANEOUS) IMPLANT
DERMABOND ADVANCED (GAUZE/BANDAGES/DRESSINGS) ×2
DERMABOND ADVANCED .7 DNX12 (GAUZE/BANDAGES/DRESSINGS) ×1 IMPLANT
DEVICE DISSECT PLASMABLAD 3.0S (MISCELLANEOUS) IMPLANT
DEVICE DSSCT PLSMBLD 3.0S LGHT (MISCELLANEOUS) ×1 IMPLANT
DRAIN CHANNEL 19F RND (DRAIN) ×3 IMPLANT
DRAPE LAPAROSCOPIC ABDOMINAL (DRAPES) ×3 IMPLANT
DRAPE UTILITY XL STRL (DRAPES) ×3 IMPLANT
DRSG PAD ABDOMINAL 8X10 ST (GAUZE/BANDAGES/DRESSINGS) ×3 IMPLANT
DRSG TEGADERM 4X4.75 (GAUZE/BANDAGES/DRESSINGS) ×3 IMPLANT
ELECT COATED BLADE 2.86 ST (ELECTRODE) IMPLANT
ELECT REM PT RETURN 9FT ADLT (ELECTROSURGICAL) ×3
ELECTRODE REM PT RTRN 9FT ADLT (ELECTROSURGICAL) ×1 IMPLANT
EVACUATOR SILICONE 100CC (DRAIN) ×3 IMPLANT
GAUZE SPONGE 4X4 12PLY STRL LF (GAUZE/BANDAGES/DRESSINGS) ×3 IMPLANT
GLOVE BIO SURGEON STRL SZ7 (GLOVE) ×6 IMPLANT
GLOVE BIO SURGEON STRL SZ7.5 (GLOVE) ×3 IMPLANT
GLOVE BIOGEL PI IND STRL 6.5 (GLOVE) ×1 IMPLANT
GLOVE BIOGEL PI IND STRL 7.0 (GLOVE) ×1 IMPLANT
GLOVE BIOGEL PI INDICATOR 6.5 (GLOVE) ×2
GLOVE BIOGEL PI INDICATOR 7.0 (GLOVE) ×2
GOWN STRL REUS W/ TWL LRG LVL3 (GOWN DISPOSABLE) ×3 IMPLANT
GOWN STRL REUS W/TWL LRG LVL3 (GOWN DISPOSABLE) ×9
ILLUMINATOR WAVEGUIDE N/F (MISCELLANEOUS) IMPLANT
LIGHT WAVEGUIDE WIDE FLAT (MISCELLANEOUS) IMPLANT
NEEDLE HYPO 25X1 1.5 SAFETY (NEEDLE) ×3 IMPLANT
NS IRRIG 1000ML POUR BTL (IV SOLUTION) ×3 IMPLANT
PACK BASIN DAY SURGERY FS (CUSTOM PROCEDURE TRAY) ×3 IMPLANT
PENCIL SMOKE EVACUATOR (MISCELLANEOUS) IMPLANT
PIN SAFETY STERILE (MISCELLANEOUS) ×3 IMPLANT
PLASMABLADE 3.0S (MISCELLANEOUS)
PLASMABLADE 3.0S W/LIGHT (MISCELLANEOUS) ×3
SLEEVE SCD COMPRESS KNEE MED (MISCELLANEOUS) ×3 IMPLANT
SPONGE LAP 18X18 RF (DISPOSABLE) ×3 IMPLANT
SUT ETHILON 2 0 FS 18 (SUTURE) ×3 IMPLANT
SUT MNCRL AB 4-0 PS2 18 (SUTURE) ×3 IMPLANT
SUT SILK 2 0 SH (SUTURE) IMPLANT
SUT VICRYL 3-0 CR8 SH (SUTURE) ×3 IMPLANT
SYR CONTROL 10ML LL (SYRINGE) ×3 IMPLANT
TOWEL GREEN STERILE FF (TOWEL DISPOSABLE) ×3 IMPLANT
TRAY FOLEY W/BAG SLVR 14FR LF (SET/KITS/TRAYS/PACK) IMPLANT
TUBE CONNECTING 20'X1/4 (TUBING) ×1
TUBE CONNECTING 20X1/4 (TUBING) ×2 IMPLANT
YANKAUER SUCT BULB TIP NO VENT (SUCTIONS) ×3 IMPLANT

## 2020-03-07 NOTE — Anesthesia Procedure Notes (Signed)
Procedure Name: LMA Insertion Date/Time: 03/07/2020 2:35 PM Performed by: Glory Buff, CRNA Pre-anesthesia Checklist: Patient identified, Emergency Drugs available, Suction available and Patient being monitored Patient Re-evaluated:Patient Re-evaluated prior to induction Oxygen Delivery Method: Circle system utilized Preoxygenation: Pre-oxygenation with 100% oxygen Induction Type: IV induction LMA: LMA inserted LMA Size: 4.0 Number of attempts: 1 Placement Confirmation: positive ETCO2 Tube secured with: Tape Dental Injury: Teeth and Oropharynx as per pre-operative assessment

## 2020-03-07 NOTE — Op Note (Signed)
03/07/2020  4:01 PM  PATIENT:  Jill [name]  44 y.o. female  PRE-OPERATIVE DIAGNOSIS:  RIGHT BREAST CANCER  POST-OPERATIVE DIAGNOSIS:  RIGHT BREAST CANCER  PROCEDURE:  Procedure(s): RIGHT MASTECTOMY WITH DEEP RIGHT AXILLARY SENTINEL LYMPH NODE BIOPSY (Right)  SURGEON:  Surgeon(s) and Role:    * Jovita Kussmaul, MD - Primary  PHYSICIAN ASSISTANT:   ASSISTANTS: none   ANESTHESIA:   local and general  EBL:  minimal   BLOOD ADMINISTERED:none  DRAINS: (1) Jackson-Pratt drain(s) with closed bulb suction in the prepectoral space   LOCAL MEDICATIONS USED:  MARCAINE     SPECIMEN:  Source of Specimen:  right mastectomy and sentinel nodes x 4  DISPOSITION OF SPECIMEN:  PATHOLOGY  COUNTS:  YES  TOURNIQUET:  * No tourniquets in log *  DICTATION: .Dragon Dictation   After informed consent was obtained the patient was brought to the operating room and placed in the supine position on the operating table.  After adequate induction of general anesthesia the patient's right chest, breast, and axillary area were prepped with ChloraPrep, allowed to dry, and draped in usual sterile manner.  An appropriate timeout was performed.  Earlier in the day the patient underwent injection of 1 mCi of technetium sulfur colloid in the subareolar position on the right.  The neoprobe was set to technetium and then the area of radioactivity was readily identified in the right axilla.  An elliptical incision was then made around the nipple and areola complex in order to minimize the excess skin.  The incision was carried through the skin and subcutaneous tissue sharply with the PlasmaBlade.  Breast hooks were then used to elevate the skin flaps anteriorly towards the ceiling.  Thin skin flaps were then created circumferentially by dissection between the breast tissue and the subcutaneous fat.  This dissection was carried all the way to the chest wall.  Next the breast was removed from the  pectoralis muscle with the pectoralis fashion.  Once this was accomplished the entire breast was removed from the patient and marked with a stitch on the lateral skin.  This was sent to pathology for further evaluation.  The neoprobe was then used to identify 4 hot lymph nodes.  Each of these lymph nodes was excised sharply with the PlasmaBlade and the surrounding small vessels and lymphatics were controlled with clips.  Ex vivo counts on these nodes ranged from 100 to 700.  No other hot or palpable nodes were identified in the right axilla.  Hemostasis was achieved using the PlasmaBlade.  The wound was irrigated with copious amounts of saline.  A small stab incision was made near the anterior axillary line inferior to the operative bed.  A tonsil clamp was then used to bring a 19 [place] round Blake drain into the operative bed through this incision.  The drain was anchored to the skin with a 3-0 nylon stitch.  The drain was curled along the chest wall and axilla.  Next the superior and inferior skin flaps were grossly reapproximated with interrupted 3-0 Vicryl stitches.  The skin was then closed with a running 4-0 Monocryl subcuticular stitch.  Dermabond dressings and sterile dry dressings were applied.  The patient tolerated the procedure well.  At the end of the case all needle sponge and instrument counts were correct.  The patient was then awakened and taken to recovery in stable condition.  PLAN OF CARE: Admit for overnight observation  PATIENT DISPOSITION:  PACU - hemodynamically  stable.   Delay start of Pharmacological VTE agent (>24hrs) due to surgical blood loss or risk of bleeding: no

## 2020-03-07 NOTE — Progress Notes (Signed)
Nuc med inj performed by nuc med staff.  Pt tolerate well with sedation.  VSS.

## 2020-03-07 NOTE — Discharge Instructions (Signed)
° ° ° °  JP Drain Totals °· Bring this sheet to all of your post-operative appointments while you have your drains. °· Please measure your drains by CC's or ML's. °· Make sure you drain and measure your JP Drains 2 or 3 times per day. °· At the end of each day, add up totals for the left side and add up totals for the right side. °   ( 9 am )     ( 3 pm )        ( 9 pm )                °Date L  R  L  R  L  R  Total L/R  °               °               °               °               °               °               °               °               °               °               °               °               ° ° ° °Post Anesthesia Home Care Instructions ° °Activity: °Get plenty of rest for the remainder of the day. A responsible individual must stay with you for 24 hours following the procedure.  °For the next 24 hours, DO NOT: °-Drive a car °-Operate machinery °-Drink alcoholic beverages °-Take any medication unless instructed by your physician °-Make any legal decisions or sign important papers. ° °Meals: °Start with liquid foods such as gelatin or soup. Progress to regular foods as tolerated. Avoid greasy, spicy, heavy foods. If nausea and/or vomiting occur, drink only clear liquids until the nausea and/or vomiting subsides. Call your physician if vomiting continues. ° °Special Instructions/Symptoms: °Your throat may feel dry or sore from the anesthesia or the breathing tube placed in your throat during surgery. If this causes discomfort, gargle with warm salt water. The discomfort should disappear within 24 hours. ° °If you had a scopolamine patch placed behind your ear for the management of post- operative nausea and/or vomiting: ° °1. The medication in the patch is effective for 72 hours, after which it should be removed.  Wrap patch in a tissue and discard in the trash. Wash hands thoroughly with soap and water. °2. You may remove the patch earlier than 72 hours if you experience unpleasant side effects which  may include dry mouth, dizziness or visual disturbances. °3. Avoid touching the patch. Wash your hands with soap and water after contact with the patch. °  ° °

## 2020-03-07 NOTE — Interval H&P Note (Signed)
History and Physical Interval Note:  03/07/2020 1:56 PM  [place]  has presented today for surgery, with the diagnosis of RIGHT BREAST CANCER.  The various methods of treatment have been discussed with the patient and family. After consideration of risks, benefits and other options for treatment, the patient has consented to  Procedure(s): RIGHT MASTECTOMY WITH SENTINEL LYMPH NODE BIOPSY (Right) as a surgical intervention.  The patient's history has been reviewed, patient examined, no change in status, stable for surgery.  I have reviewed the patient's chart and labs.  Questions were answered to the patient's satisfaction.     Autumn Messing III

## 2020-03-07 NOTE — H&P (Signed)
[place]  Location: [facility] Patient #: [id number] DOB: 1975/08/23 Married / Language: Undefined / Race: Undefined Female   History of Present Illness  The patient is a 44 year old female who presents with breast cancer. We are asked to see the patient in consultation by Dr. Vickii Chafe constant to evaluate her for a new right breast cancer. The patient is a 44 year old Hispanic female who has felt a mass in the upper outer quadrant on the right breast for the last 2 years. She brought this to the attention of her medical doctor who ordered a mammogram. She was found to have a mass in the upper outer quadrant with surrounding microcalcifications that covered an area of 4.9 cm. 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 It was ER and PR positive with a Ki-67 of 20%. The HER-2 has not been reported yet. She is otherwise in good health and does not smoke.   Past Surgical History  No pertinent past surgical history   Diagnostic Studies History  Mammogram  within last year Pap Smear  1-5 years ago  Allergies  No Known Drug Allergies    Medication History  No Current Medications Medications Reconciled  Social History  Caffeine use  Carbonated beverages, Coffee, Tea. No alcohol use  No drug use  Tobacco use  Never smoker.  Family History  Cerebrovascular Accident  Mother. Diabetes Mellitus  Family Members In General. Hypertension  Mother. Seizure disorder  Daughter.  Pregnancy / Birth History Age at menarche  76 years. Contraceptive History  Contraceptive implant. Gravida  4 Irregular periods  Length (months) of breastfeeding  >24 Maternal age  74-20 Para  4  Other Problems Anxiety Disorder  Lump In Breast     Review of Systems  General Not Present- Appetite Loss, Chills, Fatigue, Fever, Night Sweats, Weight Gain and Weight Loss. Skin Not Present- Change in Wart/Mole,  Dryness, Hives, Jaundice, New Lesions, Non-Healing Wounds, Rash and Ulcer. HEENT Not Present- Earache, Hearing Loss, Hoarseness, Nose Bleed, Oral Ulcers, Ringing in the Ears, Seasonal Allergies, Sinus Pain, Sore Throat, Visual Disturbances, Wears glasses/contact lenses and Yellow Eyes. Respiratory Not Present- Bloody sputum, Chronic Cough, Difficulty Breathing, Snoring and Wheezing. Cardiovascular Not Present- Chest Pain, Difficulty Breathing Lying Down, Leg Cramps, Palpitations, Rapid Heart Rate, Shortness of Breath and Swelling of Extremities. Gastrointestinal Not Present- Abdominal Pain, Bloating, Bloody Stool, Change in Bowel Habits, Chronic diarrhea, Constipation, Difficulty Swallowing, Excessive gas, Gets full quickly at meals, Hemorrhoids, Indigestion, Nausea, Rectal Pain and Vomiting. Female Genitourinary Not Present- Frequency, Nocturia, Painful Urination, Pelvic Pain and Urgency. Musculoskeletal Not Present- Back Pain, Joint Pain, Joint Stiffness, Muscle Pain, Muscle Weakness and Swelling of Extremities. Neurological Not Present- Decreased Memory, Fainting, Headaches, Numbness, Seizures, Tingling, Tremor, Trouble walking and Weakness. Psychiatric Present- Anxiety. Not Present- Bipolar, Change in Sleep Pattern, Depression, Fearful and Frequent crying. Endocrine Not Present- Cold Intolerance, Excessive Hunger, Hair Changes, Heat Intolerance, Hot flashes and New Diabetes. Hematology Not Present- Blood Thinners, Easy Bruising, Excessive bleeding, Gland problems, HIV and Persistent Infections.  Vitals  Weight: 153.25 lb Height: 49in Body Surface Area: 1.44 m Body Mass Index: 44.88 kg/m  Temp.: 98.14F  Pulse: 80 (Regular)  P.OX: 99% (Room air) BP: 112/76(Sitting, Left Arm, Standard)       Physical Exam General Mental Status-Alert. General Appearance-Consistent with stated age. Hydration-Well hydrated. Voice-Normal.  Head and Neck Head-normocephalic,  atraumatic with no lesions or palpable masses. Trachea-midline. Thyroid Gland Characteristics -  normal size and consistency.  Eye Eyeball - Bilateral-Extraocular movements intact. Sclera/Conjunctiva - Bilateral-No scleral icterus.  Chest and Lung Exam Chest and lung exam reveals -quiet, even and easy respiratory effort with no use of accessory muscles and on auscultation, normal breath sounds, no adventitious sounds and normal vocal resonance. Inspection Chest Wall - Normal. Back - normal.  Breast Note: There is a 5 cm mobile palpable mass in the upper outer quadrant of the right breast. It is not tethered to the chest wall and there are no overlying skin changes. There is no palpable mass in the left breast. There is no palpable axillary, supra clavicular, or cervical lymphadenopathy.   Cardiovascular Cardiovascular examination reveals -normal heart sounds, regular rate and rhythm with no murmurs and normal pedal pulses bilaterally.  Abdomen Inspection Inspection of the abdomen reveals - No Hernias. Skin - Scar - no surgical scars. Palpation/Percussion Palpation and Percussion of the abdomen reveal - Soft, Non Tender, No Rebound tenderness, No Rigidity (guarding) and No hepatosplenomegaly. Auscultation Auscultation of the abdomen reveals - Bowel sounds normal.  Neurologic Neurologic evaluation reveals -alert and oriented x 3 with no impairment of recent or remote memory. Mental Status-Normal.  Musculoskeletal Normal Exam - Left-Upper Extremity Strength Normal and Lower Extremity Strength Normal. Normal Exam - Right-Upper Extremity Strength Normal and Lower Extremity Strength Normal.  Lymphatic Head & Neck  General Head & Neck Lymphatics: Bilateral - Description - Normal. Axillary  General Axillary Region: Bilateral - Description - Normal. Tenderness - Non Tender. Femoral & Inguinal  Generalized Femoral & Inguinal Lymphatics: Bilateral - Description -  Normal. Tenderness - Non Tender.    Assessment & Plan  MALIGNANT NEOPLASM OF UPPER-OUTER QUADRANT OF RIGHT BREAST IN FEMALE, ESTROGEN RECEPTOR POSITIVE (C50.411) Impression: The patient appears to have a 5 cm area of invasive and in situ cancer in the upper outer quadrant of the right breast with clinically negative nodes. Her tumor markers have not all been reported yet. I have discussed with her in detail the different options for treatment. At this point I would likely favor mastectomy given the size of the cancer versus the size of the breast. She would also be a candidate for sentinel node biopsy at the same time. If she chooses mastectomy she would be interested in reconstruction. I will go ahead and refer her to medical and radiation oncology to discuss adjuvant versus neoadjuvant therapy. I will also refer her to plastic surgery to talk about options for reconstruction. Once we have the rest of the information then we can make a final choice as to how she would like to proceed. I have discussed with her in detail the risks and benefits of the operation as well as some of the technical aspects and she understands and wishes to proceed. This patient encounter took 60 minutes today to perform the following: take history, perform exam, review outside records, interpret imaging, counsel the patient on their diagnosis and document encounter, findings & plan in the EHR  Plan for right mastectomy and sentinel node biopsy

## 2020-03-07 NOTE — Transfer of Care (Signed)
Immediate Anesthesia Transfer of Care Note  Patient: Jill [name]  Procedure(s) Performed: RIGHT MASTECTOMY WITH SENTINEL LYMPH NODE BIOPSY (Right Breast)  Patient Location: PACU  Anesthesia Type:General  Level of Consciousness: drowsy, patient cooperative and responds to stimulation  Airway & Oxygen Therapy: Patient Spontanous Breathing and Patient connected to face mask oxygen  Post-op Assessment: Report given to RN and Post -op Vital signs reviewed and stable  Post vital signs: Reviewed and stable  Last Vitals:  Vitals Value Taken Time  BP 123/60 03/07/20 1611  Temp    Pulse 92 03/07/20 1611  Resp    SpO2 100 % 03/07/20 1611  Vitals shown include unvalidated device data.  Last Pain:  Vitals:   03/07/20 1325  TempSrc: Oral         Complications: No complications documented.

## 2020-03-07 NOTE — Anesthesia Postprocedure Evaluation (Signed)
Anesthesia Post Note  Patient: Jill [name]  Procedure(s) Performed: RIGHT MASTECTOMY WITH SENTINEL LYMPH NODE BIOPSY (Right Breast)     Patient location during evaluation: PACU Anesthesia Type: General Level of consciousness: awake and alert Pain management: pain level controlled Vital Signs Assessment: post-procedure vital signs reviewed and stable Respiratory status: spontaneous breathing, nonlabored ventilation, respiratory function stable and patient connected to nasal cannula oxygen Cardiovascular status: blood pressure returned to baseline and stable Postop Assessment: no apparent nausea or vomiting Anesthetic complications: no   No complications documented.  Last Vitals:  Vitals:   03/07/20 1745 03/07/20 1746  BP:    Pulse:    Resp:    Temp:    SpO2: (!) 89% 98%    Last Pain:  Vitals:   03/07/20 1700  TempSrc:   PainSc: 4                  Barnet [place]

## 2020-03-08 MED FILL — METHOCARBAMOL 750 MG TABS: 750 | 7 days supply | Qty: 30 | Fill #0

## 2020-03-09 LAB — SURGICAL PATHOLOGY

## 2020-03-10 ENCOUNTER — Encounter: Payer: Self-pay | Admitting: *Deleted

## 2020-03-10 ENCOUNTER — Other Ambulatory Visit: Payer: Self-pay | Admitting: Oncology

## 2020-03-13 NOTE — Progress Notes (Signed)
[place]  [phone] [phone]     ID: Jill [name] DOB: 04-07-1976  MR#: [id number]  CHE#:[id number]  Patient Care Team: Gildardo Pounds, NP as PCP - General (Nurse Practitioner) Duston Smolenski, Virgie Dad, MD as Consulting Physician (Oncology) Jovita Kussmaul, MD as Consulting Physician (General Surgery) Dillingham, Loel Lofty, DO as Attending Physician (Plastic Surgery) Mauro Kaufmann, RN as Oncology Nurse Navigator Rockwell Germany, RN as Oncology Nurse Navigator Chauncey Cruel, MD OTHER MD:  CHIEF COMPLAINT: triple positive breast cancer  CURRENT TREATMENT: Adjuvant chemotherapy  INTERVAL HISTORY: Jill [name] returns today for follow up of her triple positive breast cancer. She was evaluated in the breast cancer clinic on 02/12/2020.  She was started on tamoxifen that day.  She has tolerated this so far with no side effects that she is aware of.  Since consultation, she underwent genetic counseling on 02/22/2020. Results were negative with the exception of a variant of uncertain significance in [place].  She also underwent port placement on 03/02/2020.  She has a little bit of discomfort associated with this, but no bleeding or erythema.  She opted to proceed with right mastectomy on 03/07/2020 under Dr. Marlou Starks. 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  Out of four biopsied lymph nodes, one showed metastatic carcinoma (1/4).   REVIEW OF SYSTEMS: Jill [name] is recovering well from her's surgery.  She has limited motion in the right upper extremity.  She is concerned about the "dog ear" on that side.  She tells me her 44 year old daughter has been staying home for the past week to help her out and she requested a letter documenting that.  Aside from that a detailed review of systems  today was stable   HISTORY OF CURRENT ILLNESS: From the original intake note:  "Jill [name]" herself palpated a right breast abnormality sometime in 2019.,  She did not pay much mind.  This was noted again during her pregnancy and she underwent bilateral diagnostic mammography with tomography and right breast ultrasonography at [facility] on 01/21/2020 showing: breast density category C; 4.1 cm irregular mass with associated group of pleomorphic calcifications measuring approximately 4.9 cm, located at site of palpable concern in upper-outer right breast at 9:30; no right axillary lymphadenopathy.  Accordingly on 02/04/2020 she proceeded to biopsy of the right breast area in question. The pathology from this procedure ([id number]) showed: invasive and in situ mammary carcinoma, e-cadherin positive, grade 2. Prognostic indicators significant for: estrogen receptor, 100% positive and progesterone receptor, 100% positive, both with strong staining intensity. Proliferation marker Ki67 at 20%. HER2 equivocal by immunohistochemistry (2+), but positive by fluorescent in situ hybridization with a signals ratio 5.39 and number per cell 11.05.  The patient's subsequent history is as detailed below.   PAST MEDICAL HISTORY: Past Medical History:  Diagnosis Date  . Infected cyst of Bartholin's gland duct   . Medical history non-contributory     PAST SURGICAL HISTORY: Past Surgical History:  Procedure Laterality Date  . IR IMAGING GUIDED PORT INSERTION  03/02/2020  . MASTECTOMY W/ SENTINEL NODE BIOPSY Right 03/07/2020   Procedure: RIGHT MASTECTOMY WITH SENTINEL LYMPH NODE BIOPSY;  Surgeon: Jovita Kussmaul, MD;  Location: [facility];  Service: General;  Laterality: Right;  . NO PAST SURGERIES    . WISDOM TOOTH EXTRACTION      FAMILY HISTORY: Family  History  Problem Relation Age of Onset  . Diabetes Maternal Grandmother   . Hypertension Mother   The patient's father is 10 and her  mother 47 as of August 2021.  The patient is 3 brothers and 2 sisters.  There is no history of cancer in the family to her knowledge   GYNECOLOGIC HISTORY:  Patient's last menstrual period was 02/17/2020. Menarche: 44 years old Age at first live birth: 44 years old [place] P 4 LMP irregular Contraceptive: Mirena IUD in place HRT n/a  Hysterectomy? no BSO? 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  Her significant other [name] works Occupational hygienist.  They are both from [place].  The patient's children are ages 79, 59, 59, and 86 months as of August 2021.  The older 3 children are from a different father and their last name is [name].  (The patient was not married to Mr. [name] so there is no legal issue regarding access to medical records, etc.). The 36-monthold is a child of A[place] and MAnderson  The patient attends a local ED[place]IVES: Not in place   HEALTH MAINTENANCE: Social History   Tobacco Use  . Smoking status: Never Smoker  . Smokeless tobacco: Never Used  Vaping Use  . Vaping Use: Never used  Substance Use Topics  . Alcohol use: Never  . Drug use: Never     Colonoscopy: n/a (age)  PAP: 10/2018, negative  Bone density: n/a (age)   No Known Allergies  Current Outpatient Medications  Medication Sig Dispense Refill  . acetaminophen (TYLENOL) 500 MG tablet Take 500 mg by mouth every 6 (six) hours as needed for moderate pain or headache.    . dexamethasone (DECADRON) 4 MG tablet Take 2 tablets (8 mg total) by mouth 2 (two) times daily. Take decadron  Twice daily the day before taxotere then daily x 3 days after carboplatin  chemo 30 tablet 1  . HYDROcodone-acetaminophen (NORCO/VICODIN) 5-325 MG tablet Take 1-2 tablets by mouth every 6 (six) hours as needed for moderate pain or severe pain. 15 tablet 0  . ibuprofen  (ADVIL) 200 MG tablet Take 400 mg by mouth every 6 (six) hours as needed for headache or moderate pain.    .Marland Kitchenlidocaine-prilocaine (EMLA) cream Apply to affected area once 30 g 3  . loratadine (CLARITIN) 10 MG tablet Take 1 tablet (10 mg total) by mouth daily. 60 tablet 6  . LORazepam (ATIVAN) 0.5 MG tablet Take 1 tablet (0.5 mg total) by mouth at bedtime as needed (Nausea or vomiting). 20 tablet 0  . methocarbamol (ROBAXIN) 750 MG tablet Take 1 tablet (750 mg total) by mouth 4 (four) times daily as needed (use for muscle cramps/pain). 30 tablet 2  . prochlorperazine (COMPAZINE) 10 MG tablet Take 1 tablet (10 mg total) by mouth every 6 (six) hours as needed (Nausea or vomiting). 30 tablet 1   No current facility-administered medications for this visit.    OBJECTIVE: Spanish speaker who appears stated age  V95   03/14/20 1300  BP: 102/77  Pulse: 87  Resp: 18  Temp: (!) 97.4 F (36.3 C)  SpO2: 100%     Body mass index is 31.33 kg/m.   Wt Readings from Last 3 Encounters:  03/14/20 149 lb 14.4 oz (68 kg)  03/07/20 151 lb 10.8 oz (68.8 kg)  03/02/20 152 lb (68.9 kg)      ECOG FS:1 - Symptomatic but completely ambulatory  Sclerae unicteric, EOMs intact Wearing a mask No cervical or supraclavicular adenopathy Lungs no rales or rhonchi Heart regular rate and rhythm Abd soft, nontender, positive bowel sounds MSK no focal spinal tenderness, no right upper extremity lymphedema Neuro: nonfocal, well oriented, appropriate affect Breasts: The right breast is status post mastectomy.  The incision is healing very nicely, with no erythema swelling or dehiscence.  The "dog ear" at the lateral aspect of the incision appears entirely normal.  Left breast and left axilla are benign.   LAB RESULTS:  CMP     Component Value Date/Time   NA 139 02/22/2020 1353   NA 138 12/15/2019 1543   K 4.2 02/22/2020 1353   CL 107 02/22/2020 1353   CO2 29 02/22/2020 1353   GLUCOSE 100 (H) 02/22/2020  1353   BUN 10 02/22/2020 1353   BUN 10 12/15/2019 1543   CREATININE 0.77 02/22/2020 1353   CALCIUM 9.7 02/22/2020 1353   PROT 7.0 02/22/2020 1353   PROT 7.3 12/15/2019 1543   ALBUMIN 4.1 02/22/2020 1353   ALBUMIN 4.5 12/15/2019 1543   AST 13 (L) 02/22/2020 1353   ALT 8 02/22/2020 1353   ALKPHOS 65 02/22/2020 1353   BILITOT 0.3 02/22/2020 1353   GFRNONAA >60 02/22/2020 1353   GFRAA >60 02/22/2020 1353    No results found for: TOTALPROTELP, ALBUMINELP, A1GS, A2GS, BETS, BETA2SER, GAMS, MSPIKE, SPEI  Lab Results  Component Value Date   WBC 5.0 02/22/2020   NEUTROABS 2.9 02/22/2020   HGB 13.9 02/22/2020   HCT 41.1 02/22/2020   MCV 89.3 02/22/2020   PLT 382 02/22/2020    No results found for: LABCA2  No components found for: ZCHYIF027  No results for input(s): INR in the last 168 hours.  No results found for: LABCA2  No results found for: XAJ287  No results found for: OMV672  No results found for: CNO709  No results found for: CA2729  No components found for: HGQUANT  No results found for: CEA1 / No results found for: CEA1   No results found for: AFPTUMOR  No results found for: CHROMOGRNA  No results found for: KPAFRELGTCHN, LAMBDASER, KAPLAMBRATIO (kappa/lambda light chains)  Lab Results  Component Value Date   HGBA 97.0 10/10/2018   (Hemoglobinopathy evaluation)   No results found for: LDH  No results found for: IRON, TIBC, IRONPCTSAT (Iron and TIBC)  No results found for: FERRITIN  Urinalysis    Component Value Date/Time   COLORURINE YELLOW 12/31/2017 2039   APPEARANCEUR CLOUDY (A) 12/31/2017 2039   LABSPEC 1.015 12/31/2017 2039   PHURINE 5.0 12/31/2017 2039   GLUCOSEU NEGATIVE 12/31/2017 2039   HGBUR MODERATE (A) 12/31/2017 2039   BILIRUBINUR NEGATIVE 12/31/2017 2039   KETONESUR NEGATIVE 12/31/2017 2039   PROTEINUR NEGATIVE 12/31/2017 2039   NITRITE NEGATIVE 12/31/2017 2039   LEUKOCYTESUR LARGE (A) 12/31/2017 2039     STUDIES: NM  Sentinel Node Inj-No Rpt (Breast)  Result Date: 03/07/2020 Sulfur colloid was injected by the nuclear medicine technologist for melanoma sentinel node.   IR IMAGING GUIDED PORT INSERTION  Result Date: 03/02/2020 INDICATION: 44 year old female with right upper outer quadrant breast cancer, ER positive. She presents for port catheter placement to establish durable venous access. EXAM: IMPLANTED PORT A CATH PLACEMENT WITH ULTRASOUND AND FLUOROSCOPIC GUIDANCE MEDICATIONS: 2 g Ancef; The antibiotic was administered within an appropriate time interval prior to skin puncture. ANESTHESIA/SEDATION:  Versed 2 mg IV; Fentanyl 50 mcg IV; Moderate Sedation Time:  30 minutes The patient was continuously monitored during the procedure by the interventional radiology nurse under my direct supervision. FLUOROSCOPY TIME:  0 minutes, 12 seconds (1 mGy) COMPLICATIONS: None immediate. PROCEDURE: The right neck and chest was prepped with chlorhexidine, and draped in the usual sterile fashion using maximum barrier technique (cap and mask, sterile gown, sterile gloves, large sterile sheet, hand hygiene and cutaneous antiseptic). Local anesthesia was attained by infiltration with 1% lidocaine with epinephrine. Ultrasound demonstrated patency of the right internal jugular vein, and this was documented with an image. Under real-time ultrasound guidance, this vein was accessed with a 21 gauge micropuncture needle and image documentation was performed. A small dermatotomy was made at the access site with an 11 scalpel. A 0.018" wire was advanced into the SVC and the access needle exchanged for a 64F micropuncture vascular sheath. The 0.018" wire was then removed and a 0.035" wire advanced into the IVC. An appropriate location for the subcutaneous reservoir was selected below the clavicle and an incision was made through the skin and underlying soft tissues. The subcutaneous tissues were then dissected using a combination of blunt and sharp  surgical technique and a pocket was formed. A single lumen power injectable portacatheter was then tunneled through the subcutaneous tissues from the pocket to the dermatotomy and the port reservoir placed within the subcutaneous pocket. The venous access site was then serially dilated and a peel away vascular sheath placed over the wire. The wire was removed and the port catheter advanced into position under fluoroscopic guidance. The catheter tip is positioned in the superior cavoatrial junction. This was documented with a spot image. The portacatheter was then tested and found to flush and aspirate well. The port was flushed with saline followed by 100 units/mL heparinized saline. The pocket was then closed in two layers using first subdermal inverted interrupted absorbable sutures followed by a running subcuticular suture. The epidermis was then sealed with Dermabond. The dermatotomy at the venous access site was also closed with Dermabond. IMPRESSION: Successful placement of a right IJ approach Power Port with ultrasound and fluoroscopic guidance. The catheter is ready for use. Electronically Signed   By: Jacqulynn Cadet M.D.   On: 03/02/2020 18:39     ELIGIBLE FOR AVAILABLE RESEARCH PROTOCOL: AET  ASSESSMENT: 44 y.o. Jill [name] woman status post right breast upper outer quadrant biopsy 02/04/2020 for a clinical T2 N0, stage IB invasive ductal carcinoma, grade 2, triple positive, with an MIB-1 of 20%  (1) status post right mastectomy and sentinel lymph node sampling 03/07/2020 for a pT2 pN1, stage IB invasive ductal carcinoma, grade 2, with negative margins.  (a) a total of 4 lymph nodes were removed, one positive  (2) adjuvant chemo immunotherapy will consist of carboplatin, docetaxel, trastuzumab and pertuzumab every 21 days x 6 starting 03/29/2020  (3) adjuvant radiation to follow  (4) genetics testing 03/02/2020 through the [facility] Common Hereditary Cancers Panel. . The Common Hereditary  Cancers Panel found no deleterious mutations in APC, ATM, AXIN2, BARD1, BMPR1A, BRCA1, BRCA2, BRIP1, CDH1, CDK4, CDKN2A (p14ARF), CDKN2A (p16INK4a), CHEK2, CTNNA1, DICER1, EPCAM (Deletion/duplication testing only), GREM1 (promoter region deletion/duplication testing only), KIT, MEN1, MLH1, MSH2, MSH3, MSH6, MUTYH, NBN, NF1, NHTL1, PALB2, PDGFRA, PMS2, POLD1, POLE, PTEN, RAD50, RAD51C, RAD51D, RNF43, SDHB, SDHC, SDHD, SMAD4, SMARCA4. STK11, TP53, TSC1, TSC2, and VHL.  The following genes were evaluated for sequence changes only: SDHA and HOXB13 c.251G>A variant only.   (  a)  a Variant of uncertain significance (VUS) in [place] at c.3380T>G (p.Ile1127Ser) was noted  (5) tamoxifen started 02/12/2020 in anticipation of possible surgical delays, held during chemotherapy   PLAN: Jill [name] did very well with her surgery.  She still has a drain in place.  I anticipate this will be removed sometime in the next week or so.  I reassured her that the "dog ear" in the lateral aspect of the right chest wall incision is not only normal but necessary.  If we remove that there would not be sufficient tissue for her to be able to stretch her right upper extremity all the way up to her ear.  She will benefit from physical therapy once she more completely heals from her surgery  She requested a letter to document why her daughter was out of school last week and I was glad to provide that for her  Today we discussed the chemotherapy she was received which is standard carboplatin/docetaxel/trastuzumab and Pertuzumab.  Hopefully we can start 03/29/2020.  I have translated our routing sheet to Spanish to facilitate her following instructions.  I have also entered all her supportive medicines to the [facility] where she has an account.  She needs an echocardiogram.  That has been written for.  She will meet with our chemotherapy teaching nurse.  She will return to see me 03/28/2020 and if all is in place she will start  chemotherapy the next day  Total encounter time 40 minutes.Sarajane Jews C. Aaralynn Shepheard, MD 03/14/2020 2:29 PM Medical Oncology and Hematology [facility] [place], [place] [address] Tel. [phone]    Fax. [phone]   This document serves as a record of services personally performed by Lurline Del, MD. It was created on his behalf by Wilburn Mylar, a trained medical scribe. The creation of this record is based on the scribe's personal observations and the provider's statements to them.   I, Lurline Del MD, have reviewed the above documentation for accuracy and completeness, and I agree with the above.   *Total Encounter Time as defined by the Centers for Medicare and Medicaid Services includes, in addition to the face-to-face time of a patient visit (documented in the note above) non-face-to-face time: obtaining and reviewing outside history, ordering and reviewing medications, tests or procedures, care coordination (communications with other health care professionals or caregivers) and documentation in the medical record.

## 2020-03-14 ENCOUNTER — Encounter: Payer: Self-pay | Admitting: Oncology

## 2020-03-14 ENCOUNTER — Inpatient Hospital Stay: Payer: Self-pay | Attending: Oncology | Admitting: Oncology

## 2020-03-14 ENCOUNTER — Inpatient Hospital Stay: Payer: Self-pay

## 2020-03-14 ENCOUNTER — Telehealth: Payer: Self-pay | Admitting: Hematology and Oncology

## 2020-03-14 ENCOUNTER — Encounter (HOSPITAL_BASED_OUTPATIENT_CLINIC_OR_DEPARTMENT_OTHER): Payer: Self-pay | Admitting: General Surgery

## 2020-03-14 ENCOUNTER — Other Ambulatory Visit: Payer: Self-pay

## 2020-03-14 ENCOUNTER — Other Ambulatory Visit (HOSPITAL_COMMUNITY): Payer: Self-pay | Admitting: Oncology

## 2020-03-14 VITALS — BP 102/77 | HR 87 | Temp 97.4°F | Resp 18 | Ht <= 58 in | Wt 149.9 lb

## 2020-03-14 DIAGNOSIS — Z9011 Acquired absence of right breast and nipple: Secondary | ICD-10-CM | POA: Insufficient documentation

## 2020-03-14 DIAGNOSIS — Z791 Long term (current) use of non-steroidal anti-inflammatories (NSAID): Secondary | ICD-10-CM | POA: Insufficient documentation

## 2020-03-14 DIAGNOSIS — Z79899 Other long term (current) drug therapy: Secondary | ICD-10-CM | POA: Insufficient documentation

## 2020-03-14 DIAGNOSIS — C50411 Malignant neoplasm of upper-outer quadrant of right female breast: Secondary | ICD-10-CM | POA: Insufficient documentation

## 2020-03-14 DIAGNOSIS — Z9221 Personal history of antineoplastic chemotherapy: Secondary | ICD-10-CM | POA: Insufficient documentation

## 2020-03-14 DIAGNOSIS — Z170 Estrogen receptor positive status [ER+]: Secondary | ICD-10-CM | POA: Insufficient documentation

## 2020-03-14 DIAGNOSIS — Z923 Personal history of irradiation: Secondary | ICD-10-CM | POA: Insufficient documentation

## 2020-03-14 DIAGNOSIS — Z7952 Long term (current) use of systemic steroids: Secondary | ICD-10-CM | POA: Insufficient documentation

## 2020-03-14 DIAGNOSIS — Z79810 Long term (current) use of selective estrogen receptor modulators (SERMs): Secondary | ICD-10-CM | POA: Insufficient documentation

## 2020-03-14 MED ORDER — LIDOCAINE-PRILOCAINE 2.5-2.5 % EX CREA: TOPICAL_CREAM | CUTANEOUS | 3 refills | Status: DC

## 2020-03-14 MED ORDER — DEXAMETHASONE 4 MG PO TABS: 8.0000 mg | ORAL_TABLET | Freq: Two times a day (BID) | ORAL | 1 refills | Status: DC

## 2020-03-14 MED ORDER — LORAZEPAM 0.5 MG PO TABS: 0.5000 mg | ORAL_TABLET | Freq: Every evening | ORAL | 0 refills | Status: DC | PRN

## 2020-03-14 MED ORDER — LORATADINE 10 MG PO TABS: 10.0000 mg | ORAL_TABLET | Freq: Every day | ORAL | 6 refills | Status: DC

## 2020-03-14 MED ORDER — PROCHLORPERAZINE MALEATE 10 MG PO TABS: 10.0000 mg | ORAL_TABLET | Freq: Four times a day (QID) | ORAL | 1 refills | Status: DC | PRN

## 2020-03-14 MED FILL — LORazepam 0.5 MG TABS: 0.5 | 20 days supply | Qty: 20 | Fill #0

## 2020-03-14 MED FILL — LORATADINE 10 MG TABS: 10 | 60 days supply | Qty: 60 | Fill #0

## 2020-03-14 MED FILL — DEXAMETHASONE 4 MG TABLET: 4 | 30 days supply | Qty: 30 | Fill #0

## 2020-03-14 MED FILL — LIDOCAINE-PRILOCAINE CREAM: 2.5-2.5 | 30 days supply | Qty: 30 | Fill #0

## 2020-03-14 MED FILL — PROCHLORPERAZINE 10 MG TAB: 10 | 8 days supply | Qty: 30 | Fill #0

## 2020-03-14 NOTE — Telephone Encounter (Signed)
Scheduled appts per 9/20 los. Gave pt a print out of AVS.  

## 2020-03-15 ENCOUNTER — Encounter: Payer: Self-pay | Admitting: *Deleted

## 2020-03-17 ENCOUNTER — Encounter: Payer: Self-pay | Admitting: *Deleted

## 2020-03-17 NOTE — Progress Notes (Signed)
Patient has been approved for drug replacement program by [employer] for Udenyca. The enrollment period is from 03/17/2020 to 03/17/21. [employer] Patient [id number]. First DOS covered is 03/29/20.

## 2020-03-17 NOTE — Progress Notes (Signed)
Patient has been approved for drug replacement program by [employer] for Herceptin & Perjeta. The enrollment period is from 03/16/20 to 03/16/21.  [employer] Patient ID: [id number] First DOS covered is 03/29/20.

## 2020-03-18 ENCOUNTER — Other Ambulatory Visit: Payer: Self-pay

## 2020-03-18 ENCOUNTER — Ambulatory Visit (HOSPITAL_COMMUNITY)
Admission: RE | Admit: 2020-03-18 | Discharge: 2020-03-18 | Disposition: A | Discharge status: Home / Self Care | Payer: No Typology Code available for payment source | Source: Ambulatory Visit | Attending: Oncology | Admitting: Oncology

## 2020-03-18 DIAGNOSIS — Z0189 Encounter for other specified special examinations: Secondary | ICD-10-CM

## 2020-03-18 DIAGNOSIS — C50411 Malignant neoplasm of upper-outer quadrant of right female breast: Secondary | ICD-10-CM | POA: Insufficient documentation

## 2020-03-18 DIAGNOSIS — Z170 Estrogen receptor positive status [ER+]: Secondary | ICD-10-CM | POA: Insufficient documentation

## 2020-03-18 DIAGNOSIS — Z01818 Encounter for other preprocedural examination: Secondary | ICD-10-CM | POA: Insufficient documentation

## 2020-03-18 NOTE — Progress Notes (Signed)
  Echocardiogram 2D Echocardiogram has been performed.  Jill [name] 03/18/2020, 10:56 AM

## 2020-03-19 LAB — ECHOCARDIOGRAM COMPLETE
Area-P 1/2: 2.99 cm2
S' Lateral: 2.6 cm

## 2020-03-21 ENCOUNTER — Ambulatory Visit: Payer: No Typology Code available for payment source

## 2020-03-23 NOTE — Progress Notes (Signed)
Patient assistance medication updates:  Jill [name] will replace Ogivri in patients plan. Jill [name] will replace Fulphia in patients plan.  Orders updated.  Henreitta Leber, PharmD

## 2020-03-23 NOTE — Progress Notes (Addendum)
Patient has been approved for drug replacement program by [employer] for [place]. The enrollment period is from 03/22/20 to 03/22/21. ID: [id number] First DOS covered is 03/29/20.

## 2020-03-24 NOTE — Progress Notes (Signed)
Pharmacist Chemotherapy Monitoring - Initial Assessment    Anticipated start date: 03/29/20   Regimen:  . Are orders appropriate based on the patient's diagnosis, regimen, and cycle? Yes . Does the plan date match the patient's scheduled date? Yes . Is the sequencing of drugs appropriate? Yes . Are the premedications appropriate for the patient's regimen? Yes . Prior Authorization for treatment is: Pending o If applicable, is the correct biosimilar selected based on the patient's insurance? not applicable  Organ Function and Labs: Marland Kitchen Are dose adjustments needed based on the patient's renal function, hepatic function, or hematologic function? No . Are appropriate labs ordered prior to the start of patient's treatment? Yes . Other organ system assessment, if indicated: anthracyclines: Echo/ MUGA . The following baseline labs, if indicated, have been ordered: N/A  Dose Assessment: . Are the drug doses appropriate? Yes . Are the following correct: o Drug concentrations Yes o IV fluid compatible with drug Yes o Administration routes Yes o Timing of therapy Yes . If applicable, does the patient have documented access for treatment and/or plans for port-a-cath placement? not applicable . If applicable, have lifetime cumulative doses been properly documented and assessed? not applicable Lifetime Dose Tracking  No doses have been documented on this patient for the following tracked chemicals: Doxorubicin, Epirubicin, Idarubicin, Daunorubicin, Mitoxantrone, Bleomycin, Oxaliplatin, Carboplatin, Liposomal Doxorubicin  o   Toxicity Monitoring/Prevention: . The patient has the following take home antiemetics prescribed: Prochlorperazine, Dexamethasone and Lorazepam . The patient has the following take home medications prescribed: N/A . Medication allergies and previous infusion related reactions, if applicable, have been reviewed and addressed. Yes . The patient's current medication list has been  assessed for drug-drug interactions with their chemotherapy regimen. no significant drug-drug interactions were identified on review.  Order Review: . Are the treatment plan orders signed? Yes . Is the patient scheduled to see a provider prior to their treatment? Yes  I verify that I have reviewed each item in the above checklist and answered each question accordingly.  Aaleigha Bozza K 03/24/2020 1:32 PM

## 2020-03-25 ENCOUNTER — Other Ambulatory Visit: Payer: Self-pay

## 2020-03-25 ENCOUNTER — Other Ambulatory Visit (HOSPITAL_COMMUNITY): Payer: No Typology Code available for payment source

## 2020-03-25 ENCOUNTER — Ambulatory Visit (HOSPITAL_COMMUNITY)
Admission: RE | Admit: 2020-03-25 | Discharge: 2020-03-25 | Disposition: A | Discharge status: Home / Self Care | Payer: No Typology Code available for payment source | Source: Ambulatory Visit | Attending: Internal Medicine | Admitting: Internal Medicine

## 2020-03-25 ENCOUNTER — Encounter (HOSPITAL_COMMUNITY): Payer: Self-pay | Admitting: Internal Medicine

## 2020-03-25 VITALS — BP 116/70 | HR 81 | Ht <= 58 in | Wt 152.4 lb

## 2020-03-25 DIAGNOSIS — Z9011 Acquired absence of right breast and nipple: Secondary | ICD-10-CM | POA: Insufficient documentation

## 2020-03-25 DIAGNOSIS — Z79899 Other long term (current) drug therapy: Secondary | ICD-10-CM | POA: Insufficient documentation

## 2020-03-25 DIAGNOSIS — Z170 Estrogen receptor positive status [ER+]: Secondary | ICD-10-CM | POA: Insufficient documentation

## 2020-03-25 DIAGNOSIS — C50411 Malignant neoplasm of upper-outer quadrant of right female breast: Secondary | ICD-10-CM | POA: Insufficient documentation

## 2020-03-25 MED FILL — METHOCARBAMOL 750 MG TABS: 750 | 7 days supply | Qty: 30 | Fill #0

## 2020-03-25 MED FILL — LIDOCAINE-PRILOCAINE CREAM: 2.5-2.5 | 30 days supply | Qty: 30 | Fill #0

## 2020-03-25 MED FILL — LORazepam 0.5 MG TABS: 0.5 | 20 days supply | Qty: 20 | Fill #0

## 2020-03-25 MED FILL — DEXAMETHASONE 4 MG TABLET: 4 | 30 days supply | Qty: 30 | Fill #0

## 2020-03-25 MED FILL — PROCHLORPERAZINE 10 MG TAB: 10 | 8 days supply | Qty: 30 | Fill #0

## 2020-03-25 NOTE — Patient Instructions (Signed)
Your physician recommends that you return for a follow up in 3 months with an echocardiogram  Your physician has requested that you have an echocardiogram. Echocardiography is a painless test that uses sound waves to create images of your heart. It provides your doctor with information about the size and shape of your heart and how well your heart's chambers and valves are working. This procedure takes approximately one hour. There are no restrictions for this procedure.   If you have any questions or concerns before your next appointment please send [place] a message through [place] or call our office at [phone].    TO LEAVE A MESSAGE FOR THE NURSE SELECT OPTION 2, PLEASE LEAVE A MESSAGE INCLUDING: . YOUR NAME . DATE OF BIRTH . CALL BACK NUMBER . REASON FOR CALL**this is important as we prioritize the call backs  [place] AS LONG AS YOU CALL BEFORE 4:00 PM  At the [facility], you and your health needs are our priority. As part of our continuing mission to provide you with exceptional heart care, we have created designated Provider Care Teams. These Care Teams include your primary Cardiologist (physician) and Advanced Practice Providers (APPs- Physician Assistants and Nurse Practitioners) who all work together to provide you with the care you need, when you need it.   You may see any of the following providers on your designated Care Team at your next follow up: Marland Kitchen Dr Glori Bickers . Dr Loralie Champagne . Darrick Grinder, NP . Lyda Jester, PA . Audry Riles, PharmD   Please be sure to bring in all your medications bottles to every appointment.

## 2020-03-25 NOTE — Consult Note (Signed)
CARDIO-ONCOLOGY CLINIC CONSULT NOTE  Referring Physician: Dr. Jana Hakim  Primary Care: Gildardo Pounds, NP  HPI:  [name] is 44 y.o. female from [place] with right breast cancer referred by Dr. Jana Hakim for enrollment into the Cardio-Oncology program.  Denies any significant PMHx. Diagnosed with triple positive R breast cancer in 8/21. S/p R mastectomy and sentinel LN sampling 03/07/20. Plan for adjuvant chemo with carboplatin, docetaxel, trastuzumab and pertuumab starting 03/29/20   Oncology history  (1) status post right mastectomy and sentinel lymph node sampling 03/07/2020 for a pT2 pN1, stage IB invasive ductal carcinoma, grade 2, with negative margins.             (a) a total of 4 lymph nodes were removed, one positive  (2) adjuvant chemo immunotherapy will consist of carboplatin, docetaxel, trastuzumab and pertuzumab every 21 days x 6 starting 03/29/2020  (3) adjuvant radiation to follow  (4) tamoxifen started 02/12/2020 in anticipation of possible surgical delays, held during chemotherapy   Here with translator. Says she feels great. No h/o heart problems. Deneis dyspnea or other cardiac symptoms.   ECHO 03/18/20 EF 22-97% normal diastolic parameters and normal RV GLS -19.3%   Reviewed personally.    Review of Systems: [y] = yes, [ ]  = no   General: Weight gain [ ] ; Weight loss [ ] ; Anorexia [ ] ; Fatigue [ ] ; Fever [ ] ; Chills [ ] ; Weakness [ ]   Cardiac: Chest pain/pressure [ ] ; Resting SOB [ ] ; Exertional SOB [ ] ; Orthopnea [ ] ; Pedal Edema [ ] ; Palpitations [ ] ; Syncope [ ] ; Presyncope [ ] ; Paroxysmal nocturnal dyspnea[ ]   Pulmonary: Cough [ ] ; Wheezing[ ] ; Hemoptysis[ ] ; Sputum [ ] ; Snoring [ ]   GI: Vomiting[ ] ; Dysphagia[ ] ; Melena[ ] ; Hematochezia [ ] ; Heartburn[ ] ; Abdominal pain [ ] ; Constipation [ ] ; Diarrhea [ ] ; BRBPR [ ]   GU: Hematuria[ ] ; Dysuria [ ] ; Nocturia[ ]   Vascular: Pain in legs with walking [ ] ; Pain in feet with lying flat [ ] ;  Non-healing sores [ ] ; Stroke [ ] ; TIA [ ] ; Slurred speech [ ] ;  Neuro: Headaches[ ] ; Vertigo[ ] ; Seizures[ ] ; Paresthesias[ ] ;Blurred vision [ ] ; Diplopia [ ] ; Vision changes [ ]   Ortho/Skin: Arthritis [ ] ; Joint pain [ ] ; Muscle pain [ ] ; Joint swelling [ ] ; Back Pain [ ] ; Rash [ ]   Psych: Depression[ ] ; Anxiety[ ]   Heme: Bleeding problems [ ] ; Clotting disorders [ ] ; Anemia [ ]   Endocrine: Diabetes [ ] ; Thyroid dysfunction[ ]    Past Medical History:  Diagnosis Date  . Infected cyst of Bartholin's gland duct   . Medical history non-contributory     Current Outpatient Medications  Medication Sig Dispense Refill  . acetaminophen (TYLENOL) 500 MG tablet Take 500 mg by mouth every 6 (six) hours as needed for moderate pain or headache.    . dexamethasone (DECADRON) 4 MG tablet Take 2 tablets (8 mg total) by mouth 2 (two) times daily. Take decadron  Twice daily the day before taxotere then daily x 3 days after carboplatin  chemo 30 tablet 1  . HYDROcodone-acetaminophen (NORCO/VICODIN) 5-325 MG tablet Take 1-2 tablets by mouth every 6 (six) hours as needed for moderate pain or severe pain. 15 tablet 0  . ibuprofen (ADVIL) 200 MG tablet Take 400 mg by mouth every 6 (six) hours as needed for headache or moderate pain.    Marland Kitchen lidocaine-prilocaine (EMLA) cream Apply to affected area once 30 g 3  .  loratadine (CLARITIN) 10 MG tablet Take 1 tablet (10 mg total) by mouth daily. 60 tablet 6  . LORazepam (ATIVAN) 0.5 MG tablet Take 1 tablet (0.5 mg total) by mouth at bedtime as needed (Nausea or vomiting). 20 tablet 0  . methocarbamol (ROBAXIN) 750 MG tablet Take 1 tablet (750 mg total) by mouth 4 (four) times daily as needed (use for muscle cramps/pain). 30 tablet 2  . prochlorperazine (COMPAZINE) 10 MG tablet Take 1 tablet (10 mg total) by mouth every 6 (six) hours as needed (Nausea or vomiting). 30 tablet 1   No current facility-administered medications for this encounter.    No Known Allergies      Social History   Socioeconomic History  . Marital status: Single    Spouse name: Not on file  . Number of children: 4  . Years of education: Not on file  . Highest education level: 6th grade  Occupational History  . Not on file  Tobacco Use  . Smoking status: Never Smoker  . Smokeless tobacco: Never Used  Vaping Use  . Vaping Use: Never used  Substance and Sexual Activity  . Alcohol use: Never  . Drug use: Never  . Sexual activity: Not Currently    Birth control/protection: I.U.D.  Other Topics Concern  . Not on file  Social History Narrative  . Not on file   Social Determinants of Health   Financial Resource Strain:   . Difficulty of Paying Living Expenses: Not on file  Food Insecurity:   . Worried About Charity fundraiser in the Last Year: Not on file  . Ran Out of Food in the Last Year: Not on file  Transportation Needs: No Transportation Needs  . Lack of Transportation (Medical): No  . Lack of Transportation (Non-Medical): No  Physical Activity:   . Days of Exercise per Week: Not on file  . Minutes of Exercise per Session: Not on file  Stress:   . Feeling of Stress : Not on file  Social Connections:   . Frequency of Communication with Friends and Family: Not on file  . Frequency of Social Gatherings with Friends and Family: Not on file  . Attends Religious Services: Not on file  . Active Member of Clubs or Organizations: Not on file  . Attends Archivist Meetings: Not on file  . Marital Status: Not on file  Intimate Partner Violence:   . Fear of Current or Ex-Partner: Not on file  . Emotionally Abused: Not on file  . Physically Abused: Not on file  . Sexually Abused: Not on file      Family History  Problem Relation Age of Onset  . Diabetes Maternal Grandmother   . Hypertension Mother     Vitals:   03/25/20 0906  Weight: 69.1 kg (152 lb 6.4 oz)  Height: 4\' 10"  (1.473 m)    PHYSICAL EXAM: General:  Well appearing. No respiratory  difficulty HEENT: normal Neck: supple. no JVD. Carotids 2+ bilat; no bruits. No lymphadenopathy or thryomegaly appreciated. Cor: PMI nondisplaced. Regular rate & rhythm. No rubs, gallops or murmurs. Lungs: clear Abdomen: soft, nontender, nondistended. No hepatosplenomegaly. No bruits or masses. Good bowel sounds. Extremities: no cyanosis, clubbing, rash, edema Neuro: alert & oriented x 3, cranial nerves grossly intact. moves all 4 extremities w/o difficulty. Affect pleasant.  ECG: NSR 87 No ST-T wave abnormalities. Personally reviewed   ASSESSMENT & PLAN: 1. Right Breast Cancer, triple positive - s/p R mastectomy  - Echo 03/18/20  ECHO 60-65% GLS -19.3% - Explained incidence of Herceptin cardiotoxicity and role of Cardio-oncology clinic at length. Echo images reviewed personally. All parameters stable. Reviewed signs and symptoms of HF to look for. Continue Herceptin. Follow-up with echo in 3 months.   Glori Bickers, MD  9:09 AM

## 2020-03-25 NOTE — Addendum Note (Signed)
Encounter addended by: Malena Edman, RN on: 03/25/2020 9:57 AM  Actions taken: Order list changed, Diagnosis association updated, Clinical Note Signed

## 2020-03-27 NOTE — Progress Notes (Signed)
[facility]  [phone] [phone]) [phone]     ID: Jill [name] DOB: September 06, 1975  MR#: [id number]  ESP#:[id number]  Patient Care Team: Gildardo Pounds, NP as PCP - General (Nurse Practitioner) Ray Glacken, Virgie Dad, MD as Consulting Physician (Oncology) Jovita Kussmaul, MD as Consulting Physician (General Surgery) Dillingham, Loel Lofty, DO as Attending Physician (Plastic Surgery) Mauro Kaufmann, RN as Oncology Nurse Navigator Rockwell Germany, RN as Oncology Nurse Navigator Chauncey Cruel, MD OTHER MD:  CHIEF COMPLAINT: triple positive breast cancer  CURRENT TREATMENT: Adjuvant chemotherapy  INTERVAL HISTORY: Jill [name] returns today for follow up of her triple positive breast cancer, accompanied by her interpreter.   Since her last visit, she underwent echocardiogram on 03/18/2020 showing an ejection fraction of 60-65%.  She is scheduled to begin adjuvant chemotherapy tomorrow, 03/29/2020, which will consist of carboplatin, docetaxel, trastuzumab and pertuzumab every 21 days x6.  She met with the chemo teaching nurse and received the instructions in Spanish handout but still is a little bit confused on how to take her medication   REVIEW OF SYSTEMS: Derotha has some numbness but no real pain in the axilla.  The breast incisions is fine.  She is concerned that her immune system will be low and her children are going to school in person.  She wonders if we could write a letter so they could do virtual schooling while she is immunocompromise from chemo.  She was able to afford her medications but has not yet received a clear statement on financial support from [place].  Aside from these issues a detailed review of systems today was stable   HISTORY OF CURRENT ILLNESS: From the original intake note:  "[name]" herself palpated a right breast abnormality sometime in 2019.,  She did not pay much mind.  This was noted again during her pregnancy and she  underwent bilateral diagnostic mammography with tomography and right breast ultrasonography at [facility] on 01/21/2020 showing: breast density category C; 4.1 cm irregular mass with associated group of pleomorphic calcifications measuring approximately 4.9 cm, located at site of palpable concern in upper-outer right breast at 9:30; no right axillary lymphadenopathy.  Accordingly on 02/04/2020 she proceeded to biopsy of the right breast area in question. The pathology from this procedure ([id number]) showed: invasive and in situ mammary carcinoma, e-cadherin positive, grade 2. Prognostic indicators significant for: estrogen receptor, 100% positive and progesterone receptor, 100% positive, both with strong staining intensity. Proliferation marker Ki67 at 20%. HER2 equivocal by immunohistochemistry (2+), but positive by fluorescent in situ hybridization with a signals ratio 5.39 and number per cell 11.05.  The patient's subsequent history is as detailed below.   PAST MEDICAL HISTORY: Past Medical History:  Diagnosis Date  . Infected cyst of Bartholin's gland duct   . Medical history non-contributory     PAST SURGICAL HISTORY: Past Surgical History:  Procedure Laterality Date  . IR IMAGING GUIDED PORT INSERTION  03/02/2020  . MASTECTOMY W/ SENTINEL NODE BIOPSY Right 03/07/2020   Procedure: RIGHT MASTECTOMY WITH SENTINEL LYMPH NODE BIOPSY;  Surgeon: Jovita Kussmaul, MD;  Location: [facility];  Service: General;  Laterality: Right;  . NO PAST SURGERIES    . WISDOM TOOTH EXTRACTION      FAMILY HISTORY: Family History  Problem Relation Age of Onset  . Diabetes Maternal Grandmother   . Hypertension Mother   The patient's father is 77 and her mother 47 as of August 2021.  The  patient is 3 brothers and 2 sisters.  There is no history of cancer in the family to her knowledge   GYNECOLOGIC HISTORY:  No LMP recorded. (Menstrual status: IUD). Menarche: 44 years old Age at first  live birth: 44 years old [place] P 4 LMP irregular Contraceptive: Mirena IUD in place HRT n/a  Hysterectomy? no BSO? 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  Her significant other Jill [name] works Occupational hygienist.  They are both from [place].  The patient's children are ages 32, 14, 29, and 66 months as of August 2021.  The older 3 children are from a different father and their last name is [name].  (The patient was not married to Mr. [name] so there is no legal issue regarding access to medical records, etc.). The 74-month-old is a child of [place] and [place].  The patient attends a local [employer] DIRECTIVES: Not in place   HEALTH MAINTENANCE: Social History   Tobacco Use  . Smoking status: Never Smoker  . Smokeless tobacco: Never Used  Vaping Use  . Vaping Use: Never used  Substance Use Topics  . Alcohol use: Never  . Drug use: Never     Colonoscopy: n/a (age)  PAP: 10/2018, negative  Bone density: n/a (age)   No Known Allergies  Current Outpatient Medications  Medication Sig Dispense Refill  . acetaminophen (TYLENOL) 500 MG tablet Take 500 mg by mouth every 6 (six) hours as needed for moderate pain or headache.    . dexamethasone (DECADRON) 4 MG tablet Take 2 tablets (8 mg total) by mouth 2 (two) times daily. Take decadron  Twice daily the day before taxotere then daily x 3 days after carboplatin  chemo 30 tablet 1  . HYDROcodone-acetaminophen (NORCO/VICODIN) 5-325 MG tablet Take 1-2 tablets by mouth every 6 (six) hours as needed for moderate pain or severe pain. 15 tablet 0  . ibuprofen (ADVIL) 200 MG tablet Take 400 mg by mouth every 6 (six) hours as needed for headache or moderate pain.    Marland Kitchen lidocaine-prilocaine (EMLA) cream Apply to affected area once (Patient not taking: Reported on 03/25/2020) 30 g 3  . loratadine (CLARITIN) 10  MG tablet Take 1 tablet (10 mg total) by mouth daily. 60 tablet 6  . LORazepam (ATIVAN) 0.5 MG tablet Take 1 tablet (0.5 mg total) by mouth at bedtime as needed (Nausea or vomiting). (Patient not taking: Reported on 03/25/2020) 20 tablet 0  . methocarbamol (ROBAXIN) 750 MG tablet Take 1 tablet (750 mg total) by mouth 4 (four) times daily as needed (use for muscle cramps/pain). 30 tablet 2  . prochlorperazine (COMPAZINE) 10 MG tablet Take 1 tablet (10 mg total) by mouth every 6 (six) hours as needed (Nausea or vomiting). 30 tablet 1   No current facility-administered medications for this visit.    OBJECTIVE: Spanish speaker in no acute distress  Vitals:   03/28/20 0820  BP: 114/77  Pulse: 79  Resp: 17  Temp: 98.4 F (36.9 C)  SpO2: 98%     Body mass index is 31.52 kg/m.   Wt Readings from Last 3 Encounters:  03/28/20 150 lb 12.8 oz (68.4 kg)  03/25/20 152 lb 6.4 oz (69.1 kg)  03/14/20 149 lb 14.4 oz (68 kg)      ECOG FS:1 - Symptomatic but completely ambulatory  Sclerae unicteric, EOMs  intact Wearing a mask No cervical or supraclavicular adenopathy Lungs no rales or rhonchi Heart regular rate and rhythm Abd soft, nontender, positive bowel sounds MSK no focal spinal tenderness, no upper extremity lymphedema Neuro: nonfocal, well oriented, appropriate affect Breasts: The right breast is status post mastectomy.  There is no evidence of dehiscence erythema or swelling.  Left breast is benign.  Both axillae are benign.   LAB RESULTS:  CMP     Component Value Date/Time   NA 139 02/22/2020 1353   NA 138 12/15/2019 1543   K 4.2 02/22/2020 1353   CL 107 02/22/2020 1353   CO2 29 02/22/2020 1353   GLUCOSE 100 (H) 02/22/2020 1353   BUN 10 02/22/2020 1353   BUN 10 12/15/2019 1543   CREATININE 0.77 02/22/2020 1353   CALCIUM 9.7 02/22/2020 1353   PROT 7.0 02/22/2020 1353   PROT 7.3 12/15/2019 1543   ALBUMIN 4.1 02/22/2020 1353   ALBUMIN 4.5 12/15/2019 1543   AST 13 (L)  02/22/2020 1353   ALT 8 02/22/2020 1353   ALKPHOS 65 02/22/2020 1353   BILITOT 0.3 02/22/2020 1353   GFRNONAA >60 02/22/2020 1353   GFRAA >60 02/22/2020 1353    No results found for: TOTALPROTELP, ALBUMINELP, A1GS, A2GS, BETS, BETA2SER, GAMS, MSPIKE, SPEI  Lab Results  Component Value Date   WBC 5.7 03/28/2020   NEUTROABS 2.9 03/28/2020   HGB 13.1 03/28/2020   HCT 38.3 03/28/2020   MCV 89.1 03/28/2020   PLT 422 (H) 03/28/2020    No results found for: LABCA2  No components found for: EHUDJS970  No results for input(s): INR in the last 168 hours.  No results found for: LABCA2  No results found for: YOV785  No results found for: YIF027  No results found for: XAJ287  No results found for: CA2729  No components found for: HGQUANT  No results found for: CEA1 / No results found for: CEA1   No results found for: AFPTUMOR  No results found for: CHROMOGRNA  No results found for: KPAFRELGTCHN, LAMBDASER, KAPLAMBRATIO (kappa/lambda light chains)  Lab Results  Component Value Date   HGBA 97.0 10/10/2018   (Hemoglobinopathy evaluation)   No results found for: LDH  No results found for: IRON, TIBC, IRONPCTSAT (Iron and TIBC)  No results found for: FERRITIN  Urinalysis    Component Value Date/Time   COLORURINE YELLOW 12/31/2017 2039   APPEARANCEUR CLOUDY (A) 12/31/2017 2039   LABSPEC 1.015 12/31/2017 2039   PHURINE 5.0 12/31/2017 2039   GLUCOSEU NEGATIVE 12/31/2017 2039   HGBUR MODERATE (A) 12/31/2017 2039   BILIRUBINUR NEGATIVE 12/31/2017 2039   KETONESUR NEGATIVE 12/31/2017 2039   PROTEINUR NEGATIVE 12/31/2017 2039   NITRITE NEGATIVE 12/31/2017 2039   LEUKOCYTESUR LARGE (A) 12/31/2017 2039     STUDIES: NM Sentinel Node Inj-No Rpt (Breast)  Result Date: 03/07/2020 Sulfur colloid was injected by the nuclear medicine technologist for melanoma sentinel node.   ECHOCARDIOGRAM COMPLETE  Result Date: 03/19/2020    ECHOCARDIOGRAM REPORT   Patient Name:    [facility] Date of Exam: 03/18/2020 Medical Rec #:  [id number]                        Height:       58.0 in Accession #:    [id number]                       Weight:  149.9 lb Date of Birth:  03-27-1976                       BSA:          1.611 m Patient Age:    43 years                         BP:           102/77 mmHg Patient Gender: F                                HR:           87 bpm. Exam Location:  Inpatient Procedure: 2D Echo Indications:    chemo evaluation  History:        Patient has no prior history of Echocardiogram examinations.                 Breast cancer.  Sonographer:    Jannett Celestine RDCS (AE) Referring Phys: [place]  1. Left ventricular ejection fraction, by estimation, is 60 to 65%. The left ventricle has normal function. The left ventricle has no regional wall motion abnormalities. Left ventricular diastolic parameters were normal. The average left ventricular global longitudinal strain is -19.3 %.  2. Right ventricular systolic function is normal. The right ventricular size is normal. Tricuspid regurgitation signal is inadequate for assessing PA pressure.  3. The mitral valve is normal in structure. No evidence of mitral valve regurgitation.  4. The aortic valve was not well visualized. Aortic valve regurgitation is not visualized. No aortic stenosis is present.  5. The inferior vena cava is normal in size with greater than 50% respiratory variability, suggesting right atrial pressure of 3 mmHg. FINDINGS  Left Ventricle: Left ventricular ejection fraction, by estimation, is 60 to 65%. The left ventricle has normal function. The left ventricle has no regional wall motion abnormalities. The average left ventricular global longitudinal strain is -19.3 %. The left ventricular internal cavity size was normal in size. There is no left ventricular hypertrophy. Left ventricular diastolic parameters were normal. Right Ventricle: The right ventricular  size is normal. No increase in right ventricular wall thickness. Right ventricular systolic function is normal. Tricuspid regurgitation signal is inadequate for assessing PA pressure. Left Atrium: Left atrial size was normal in size. Right Atrium: Right atrial size was normal in size. Pericardium: There is no evidence of pericardial effusion. Mitral Valve: The mitral valve is normal in structure. No evidence of mitral valve regurgitation. Tricuspid Valve: The tricuspid valve is grossly normal. Tricuspid valve regurgitation is not demonstrated. Aortic Valve: The aortic valve was not well visualized. Aortic valve regurgitation is not visualized. No aortic stenosis is present. Pulmonic Valve: The pulmonic valve was not well visualized. Pulmonic valve regurgitation is not visualized. Aorta: The aortic root is normal in size and structure. Venous: The inferior vena cava is normal in size with greater than 50% respiratory variability, suggesting right atrial pressure of 3 mmHg. IAS/Shunts: The interatrial septum was not well visualized.  LEFT VENTRICLE PLAX 2D LVIDd:         4.00 cm  Diastology LVIDs:         2.60 cm  LV e' medial:    11.40 cm/s LV PW:         0.80 cm  LV E/e' medial:  8.5 LV IVS:  0.80 cm  LV e' lateral:   15.70 cm/s LVOT diam:     1.70 cm  LV E/e' lateral: 6.2 LV SV:         53 LV SV Index:   33       2D Longitudinal Strain LVOT Area:     2.27 cm 2D Strain GLS Avg:     -19.3 %  RIGHT VENTRICLE RV S prime:     11.40 cm/s TAPSE (M-mode): 1.8 cm LEFT ATRIUM             Index       RIGHT ATRIUM           Index LA diam:        2.50 cm 1.55 cm/m  RA Area:     11.80 cm LA Vol (A2C):   19.8 ml 12.29 ml/m RA Volume:   22.30 ml  13.84 ml/m LA Vol (A4C):   16.5 ml 10.24 ml/m LA Biplane Vol: 18.2 ml 11.30 ml/m  AORTIC VALVE LVOT Vmax:   128.00 cm/s LVOT Vmean:  93.100 cm/s LVOT VTI:    0.235 m  AORTA Ao Root diam: 2.60 cm MITRAL VALVE MV Area (PHT): 2.99 cm    SHUNTS MV Decel Time: 254 msec     Systemic VTI:  0.24 m MV E velocity: 97.30 cm/s  Systemic Diam: 1.70 cm MV A velocity: 56.10 cm/s MV E/A ratio:  1.73 Oswaldo Milian MD Electronically signed by Oswaldo Milian MD Signature Date/Time: 03/19/2020/2:41:34 PM    Final    IR IMAGING GUIDED PORT INSERTION  Result Date: 03/02/2020 INDICATION: 44 year old female with right upper outer quadrant breast cancer, ER positive. She presents for port catheter placement to establish durable venous access. EXAM: IMPLANTED PORT A CATH PLACEMENT WITH ULTRASOUND AND FLUOROSCOPIC GUIDANCE MEDICATIONS: 2 g Ancef; The antibiotic was administered within an appropriate time interval prior to skin puncture. ANESTHESIA/SEDATION: Versed 2 mg IV; Fentanyl 50 mcg IV; Moderate Sedation Time:  30 minutes The patient was continuously monitored during the procedure by the interventional radiology nurse under my direct supervision. FLUOROSCOPY TIME:  0 minutes, 12 seconds (1 mGy) COMPLICATIONS: None immediate. PROCEDURE: The right neck and chest was prepped with chlorhexidine, and draped in the usual sterile fashion using maximum barrier technique (cap and mask, sterile gown, sterile gloves, large sterile sheet, hand hygiene and cutaneous antiseptic). Local anesthesia was attained by infiltration with 1% lidocaine with epinephrine. Ultrasound demonstrated patency of the right internal jugular vein, and this was documented with an image. Under real-time ultrasound guidance, this vein was accessed with a 21 gauge micropuncture needle and image documentation was performed. A small dermatotomy was made at the access site with an 11 scalpel. A 0.018" wire was advanced into the SVC and the access needle exchanged for a 53F micropuncture vascular sheath. The 0.018" wire was then removed and a 0.035" wire advanced into the IVC. An appropriate location for the subcutaneous reservoir was selected below the clavicle and an incision was made through the skin and underlying soft  tissues. The subcutaneous tissues were then dissected using a combination of blunt and sharp surgical technique and a pocket was formed. A single lumen power injectable portacatheter was then tunneled through the subcutaneous tissues from the pocket to the dermatotomy and the port reservoir placed within the subcutaneous pocket. The venous access site was then serially dilated and a peel away vascular sheath placed over the wire. The wire was removed and the port catheter advanced into position  under fluoroscopic guidance. The catheter tip is positioned in the superior cavoatrial junction. This was documented with a spot image. The portacatheter was then tested and found to flush and aspirate well. The port was flushed with saline followed by 100 units/mL heparinized saline. The pocket was then closed in two layers using first subdermal inverted interrupted absorbable sutures followed by a running subcuticular suture. The epidermis was then sealed with Dermabond. The dermatotomy at the venous access site was also closed with Dermabond. IMPRESSION: Successful placement of a right IJ approach Power Port with ultrasound and fluoroscopic guidance. The catheter is ready for use. Electronically Signed   By: Jacqulynn Cadet M.D.   On: 03/02/2020 18:39     ELIGIBLE FOR AVAILABLE RESEARCH PROTOCOL: AET  ASSESSMENT: 44 y.o. [place] woman status post right breast upper outer quadrant biopsy 02/04/2020 for a clinical T2 N0, stage IB invasive ductal carcinoma, grade 2, triple positive, with an MIB-1 of 20%  (1) status post right mastectomy and sentinel lymph node sampling 03/07/2020 for a pT2 pN1, stage IB invasive ductal carcinoma, grade 2, with negative margins.  (a) a total of 4 lymph nodes were removed, one positive  (2) adjuvant chemo immunotherapy will consist of carboplatin, docetaxel, trastuzumab and pertuzumab every 21 days x 6 starting 03/29/2020  (3) trastuzumab/ pertuzumab to continue to complete a  year  (a) echo 03/18/2020 shows an ejection fraction in the 60-65% range  (4) adjuvant radiation to follow  (5) genetics testing 03/02/2020 through the [facility] Common Hereditary Cancers Panel. . The Common Hereditary Cancers Panel found no deleterious mutations in APC, ATM, AXIN2, BARD1, BMPR1A, BRCA1, BRCA2, BRIP1, CDH1, CDK4, CDKN2A (p14ARF), CDKN2A (p16INK4a), CHEK2, CTNNA1, DICER1, EPCAM (Deletion/duplication testing only), GREM1 (promoter region deletion/duplication testing only), KIT, MEN1, MLH1, MSH2, MSH3, MSH6, MUTYH, NBN, NF1, NHTL1, PALB2, PDGFRA, PMS2, POLD1, POLE, PTEN, RAD50, RAD51C, RAD51D, RNF43, SDHB, SDHC, SDHD, SMAD4, SMARCA4. STK11, TP53, TSC1, TSC2, and VHL.  The following genes were evaluated for sequence changes only: SDHA and HOXB13 c.251G>A variant only.   (a)  a Variant of uncertain significance (VUS) in [place] at c.3380T>G (p.Ile1127Ser) was noted  (6) tamoxifen started 02/12/2020 in anticipation of possible surgical delays, held during chemotherapy   PLAN: Kenyanna has been able to obtain her supportive medicines.  She was still a little confused on how to receive them on so we went over that in great detail today and I gave her another copy of the routing sheet.  She had not been scheduled for her shot on day 3 and I am making sure that happens the day.  I wrote her a letter as so her children could attend school virtually and so protect her a little bit from the pandemic.  She has received the vaccine.  We reviewed the fact that she will need to be receiving treatment here every 3 weeks for 1 year.  We reviewed how she should use the EMLA cream.  She already knows to take her dexamethasone today.  I am going to see her again in 1 week to make sure that she is tolerating treatment well and to clear any remaining questions  Total encounter time 30 minutes.Sarajane Jews C. Trica Usery, MD 03/28/2020 8:43 AM Medical Oncology and Hematology [facility] [place], [place] [address] Tel. [phone]    Fax. [phone]   This document serves as a record of services personally performed by Lurline Del, MD. It was created on his  behalf by Wilburn Mylar, a trained medical scribe. The creation of this record is based on the scribe's personal observations and the provider's statements to them.   I, Lurline Del MD, have reviewed the above documentation for accuracy and completeness, and I agree with the above.   *Total Encounter Time as defined by the Centers for Medicare and Medicaid Services includes, in addition to the face-to-face time of a patient visit (documented in the note above) non-face-to-face time: obtaining and reviewing outside history, ordering and reviewing medications, tests or procedures, care coordination (communications with other health care professionals or caregivers) and documentation in the medical record.

## 2020-03-28 ENCOUNTER — Inpatient Hospital Stay: Payer: Self-pay | Attending: Oncology | Admitting: Oncology

## 2020-03-28 ENCOUNTER — Other Ambulatory Visit: Payer: No Typology Code available for payment source

## 2020-03-28 ENCOUNTER — Encounter: Payer: Self-pay | Admitting: Oncology

## 2020-03-28 ENCOUNTER — Other Ambulatory Visit: Payer: Self-pay

## 2020-03-28 ENCOUNTER — Inpatient Hospital Stay: Payer: No Typology Code available for payment source

## 2020-03-28 VITALS — BP 114/77 | HR 79 | Temp 98.4°F | Resp 17 | Ht <= 58 in | Wt 150.8 lb

## 2020-03-28 DIAGNOSIS — Z5189 Encounter for other specified aftercare: Secondary | ICD-10-CM | POA: Insufficient documentation

## 2020-03-28 DIAGNOSIS — Z170 Estrogen receptor positive status [ER+]: Secondary | ICD-10-CM | POA: Insufficient documentation

## 2020-03-28 DIAGNOSIS — Z8249 Family history of ischemic heart disease and other diseases of the circulatory system: Secondary | ICD-10-CM | POA: Insufficient documentation

## 2020-03-28 DIAGNOSIS — Z975 Presence of (intrauterine) contraceptive device: Secondary | ICD-10-CM | POA: Insufficient documentation

## 2020-03-28 DIAGNOSIS — Z9011 Acquired absence of right breast and nipple: Secondary | ICD-10-CM | POA: Insufficient documentation

## 2020-03-28 DIAGNOSIS — Z5111 Encounter for antineoplastic chemotherapy: Secondary | ICD-10-CM | POA: Insufficient documentation

## 2020-03-28 DIAGNOSIS — Z79899 Other long term (current) drug therapy: Secondary | ICD-10-CM | POA: Insufficient documentation

## 2020-03-28 DIAGNOSIS — C50411 Malignant neoplasm of upper-outer quadrant of right female breast: Secondary | ICD-10-CM | POA: Insufficient documentation

## 2020-03-28 DIAGNOSIS — Z5112 Encounter for antineoplastic immunotherapy: Secondary | ICD-10-CM | POA: Insufficient documentation

## 2020-03-28 DIAGNOSIS — Z833 Family history of diabetes mellitus: Secondary | ICD-10-CM | POA: Insufficient documentation

## 2020-03-28 LAB — CBC WITH DIFFERENTIAL/PLATELET
Abs Immature Granulocytes: 0.01 10*3/uL (ref 0.00–0.07)
Basophils Absolute: 0.1 10*3/uL (ref 0.0–0.1)
Basophils Relative: 1 %
Eosinophils Absolute: 0.1 10*3/uL (ref 0.0–0.5)
Eosinophils Relative: 2 %
HCT: 38.3 % (ref 36.0–46.0)
Hemoglobin: 13.1 g/dL (ref 12.0–15.0)
Immature Granulocytes: 0 %
Lymphocytes Relative: 41 %
Lymphs Abs: 2.3 10*3/uL (ref 0.7–4.0)
MCH: 30.5 pg (ref 26.0–34.0)
MCHC: 34.2 g/dL (ref 30.0–36.0)
MCV: 89.1 fL (ref 80.0–100.0)
Monocytes Absolute: 0.3 10*3/uL (ref 0.1–1.0)
Monocytes Relative: 5 %
Neutro Abs: 2.9 10*3/uL (ref 1.7–7.7)
Neutrophils Relative %: 51 %
Platelets: 422 10*3/uL — ABNORMAL HIGH (ref 150–400)
RBC: 4.3 MIL/uL (ref 3.87–5.11)
RDW: 12 % (ref 11.5–15.5)
WBC: 5.7 10*3/uL (ref 4.0–10.5)
nRBC: 0 % (ref 0.0–0.2)

## 2020-03-28 LAB — COMPREHENSIVE METABOLIC PANEL
ALT: 11 U/L (ref 0–44)
AST: 14 U/L — ABNORMAL LOW (ref 15–41)
Albumin: 3.7 g/dL (ref 3.5–5.0)
Alkaline Phosphatase: 50 U/L (ref 38–126)
Anion gap: 6 (ref 5–15)
BUN: 12 mg/dL (ref 6–20)
CO2: 25 mmol/L (ref 22–32)
Calcium: 8.9 mg/dL (ref 8.9–10.3)
Chloride: 110 mmol/L (ref 98–111)
Creatinine, Ser: 0.7 mg/dL (ref 0.44–1.00)
GFR calc Af Amer: >60 mL/min (ref >60)
GFR calc non Af Amer: >60 mL/min (ref >60)
Glucose, Bld: 100 mg/dL — ABNORMAL HIGH (ref 70–99)
Potassium: 3.8 mmol/L (ref 3.5–5.1)
Sodium: 141 mmol/L (ref 135–145)
Total Bilirubin: 0.4 mg/dL (ref 0.3–1.2)
Total Protein: 6.7 g/dL (ref 6.5–8.1)

## 2020-03-29 ENCOUNTER — Other Ambulatory Visit: Payer: Self-pay

## 2020-03-29 ENCOUNTER — Inpatient Hospital Stay: Payer: No Typology Code available for payment source

## 2020-03-29 ENCOUNTER — Encounter: Payer: Self-pay | Admitting: *Deleted

## 2020-03-29 VITALS — BP 118/79 | HR 84 | Temp 98.8°F | Resp 16

## 2020-03-29 DIAGNOSIS — Z170 Estrogen receptor positive status [ER+]: Secondary | ICD-10-CM

## 2020-03-29 MED ORDER — PALONOSETRON HCL INJECTION 0.25 MG/5ML
INTRAVENOUS | Status: AC
  Filled 2020-03-29: qty 5

## 2020-03-29 MED ORDER — SODIUM CHLORIDE 0.9 % IV SOLN
10.0000 mg | Freq: Once | INTRAVENOUS | Status: AC
  Administered 2020-03-29: 10 mg via INTRAVENOUS
  Filled 2020-03-29: qty 10

## 2020-03-29 MED ORDER — SODIUM CHLORIDE 0.9 % IV SOLN
75.0000 mg/m2 | Freq: Once | INTRAVENOUS | Status: AC
  Administered 2020-03-29: 130 mg via INTRAVENOUS
  Filled 2020-03-29: qty 13

## 2020-03-29 MED ORDER — DIPHENHYDRAMINE HCL 25 MG PO CAPS
ORAL_CAPSULE | ORAL | Status: AC
  Filled 2020-03-29: qty 1

## 2020-03-29 MED ORDER — SODIUM CHLORIDE 0.9 % IV SOLN
623.0000 mg | Freq: Once | INTRAVENOUS | Status: AC
  Administered 2020-03-29: 620 mg via INTRAVENOUS
  Filled 2020-03-29: qty 62

## 2020-03-29 MED ORDER — TRASTUZUMAB-ANNS CHEMO 150 MG IV SOLR
8.0000 mg/kg | Freq: Once | INTRAVENOUS | Status: AC
  Administered 2020-03-29: 546 mg via INTRAVENOUS
  Filled 2020-03-29: qty 26

## 2020-03-29 MED ORDER — DIPHENHYDRAMINE HCL 25 MG PO CAPS
25.0000 mg | ORAL_CAPSULE | Freq: Once | ORAL | Status: AC
  Administered 2020-03-29: 25 mg via ORAL

## 2020-03-29 MED ORDER — SODIUM CHLORIDE 0.9 % IV SOLN
Freq: Once | INTRAVENOUS | Status: AC
  Filled 2020-03-29: qty 250

## 2020-03-29 MED ORDER — SODIUM CHLORIDE 0.9 % IV SOLN
420.0000 mg | Freq: Once | INTRAVENOUS | Status: AC
  Administered 2020-03-29: 420 mg via INTRAVENOUS
  Filled 2020-03-29: qty 14

## 2020-03-29 MED ORDER — ACETAMINOPHEN 325 MG PO TABS
650.0000 mg | ORAL_TABLET | Freq: Once | ORAL | Status: AC
  Administered 2020-03-29: 650 mg via ORAL

## 2020-03-29 MED ORDER — HEPARIN SOD (PORK) LOCK FLUSH 100 UNIT/ML IV SOLN
500.0000 [IU] | Freq: Once | INTRAVENOUS | Status: AC | PRN
  Administered 2020-03-29: 500 [IU]
  Filled 2020-03-29: qty 5

## 2020-03-29 MED ORDER — PALONOSETRON HCL INJECTION 0.25 MG/5ML
0.2500 mg | Freq: Once | INTRAVENOUS | Status: AC
  Administered 2020-03-29: 0.25 mg via INTRAVENOUS

## 2020-03-29 MED ORDER — SODIUM CHLORIDE 0.9 % IV SOLN
150.0000 mg | Freq: Once | INTRAVENOUS | Status: AC
  Administered 2020-03-29: 150 mg via INTRAVENOUS
  Filled 2020-03-29: qty 150

## 2020-03-29 MED ORDER — SODIUM CHLORIDE 0.9% FLUSH
10.0000 mL | INTRAVENOUS | Status: DC | PRN
  Administered 2020-03-29: 10 mL
  Filled 2020-03-29: qty 10

## 2020-03-29 MED ORDER — ACETAMINOPHEN 325 MG PO TABS
ORAL_TABLET | ORAL | Status: AC
  Filled 2020-03-29: qty 2

## 2020-03-29 NOTE — Patient Instructions (Signed)
[facility] Discharge Instructions for Patients Receiving Chemotherapy  Today you received the following chemotherapy agents trastuzumab, pertuzumab, docetaxel, carboplatin.  To help prevent nausea and vomiting after your treatment, we encourage you to take your nausea medication as directed.    If you develop nausea and vomiting that is not controlled by your nausea medication, call the clinic.   BELOW ARE SYMPTOMS THAT SHOULD BE REPORTED IMMEDIATELY:  *FEVER GREATER THAN 100.5 F  *CHILLS WITH OR WITHOUT FEVER  NAUSEA AND VOMITING THAT IS NOT CONTROLLED WITH YOUR NAUSEA MEDICATION  *UNUSUAL SHORTNESS OF BREATH  *UNUSUAL BRUISING OR BLEEDING  TENDERNESS IN MOUTH AND THROAT WITH OR WITHOUT PRESENCE OF ULCERS  *URINARY PROBLEMS  *BOWEL PROBLEMS  UNUSUAL RASH Items with * indicate a potential emergency and should be followed up as soon as possible.  Feel free to call the clinic should you have any questions or concerns. The clinic phone number is [phone].  Please show the [place] at check-in to the Emergency Department and triage nurse.  Trastuzumab injection for infusion Qu es este medicamento? El TRASTUZUMAB es un anticuerpo monoclonal. Genene Churn para tratar el cncer de mama y de [place]. Este medicamento puede ser utilizado para otros usos; si tiene alguna pregunta consulte con su proveedor de atencin mdica o con su farmacutico. MARCAS COMUNES: Herceptin, Belenda Cruise, [place], [place]ruz[place], Trazimera Qu le debo informar a mi profesional de la salud antes de tomar este medicamento? Necesitan saber si usted presenta alguno de los siguientes problemas o situaciones: enfermedad cardiaca insuficiencia cardiaca enfermedad pulmonar o respiratoria, como asma una reaccin alrgica o inusual al trastuzumab, al alcohol benclico, a otros medicamentos, alimentos, colorantes o conservantes si est embarazada o buscando quedar embarazada si est  amamantando a un beb Cmo debo utilizar este medicamento? Este medicamento se administra mediante infusin por va intravenosa. Lo administra un profesional de la salud calificado en un hospital o en un entorno clnico. Hable con su pediatra para informarse acerca del uso de este medicamento en nios. Este medicamento no est aprobado para uso en nios. Sobredosis: Pngase en contacto inmediatamente con un centro toxicolgico o una sala de urgencia si usted cree que haya tomado demasiado medicamento. ATENCIN: [employer] es solo para usted. No comparta este medicamento con nadie. Qu sucede si me olvido de una dosis? Es importante no olvidar ninguna dosis. Informe a su mdico o a su profesional de la salud si no puede asistir a Photographer. Qu puede interactuar con este medicamento? Este medicamento podra interactuar con los siguientes frmacos: ciertos tipos de quimioterapia, tales como daunorubicina, [place], [place], e idarubicina Puede ser que esta lista no menciona todas las posibles interacciones. Informe a su profesional de KB Home	[place] de [employer] productos a base de hierbas, medicamentos de [place] o suplementos nutritivos que est tomando. Si usted fuma, consume bebidas alcohlicas o si utiliza drogas ilegales, indqueselo tambin a su profesional de KB Home	[place]. Algunas sustancias pueden interactuar con su medicamento. A qu debo estar atento al usar Coca-Cola? Visite a su mdico para chequear su evolucin peridicamente. Si presenta algn efecto secundario, infrmelo. Sin embargo, contine con el tratamiento aun si se siente enfermo, a menos que su mdico le indique que lo suspenda. Consulte a su mdico o a su profesional de la salud si tiene fiebre, escalofros, dolor de garganta, o cualquier otro sntoma de resfro o gripe. No se trate usted mismo. Trate de no acercarse a personas que estn enfermas. Es posible que  tenga fiebre, escalofros y temblores durante su  primera infusin. Estos efectos generalmente son leves y pueden tratarse con otros medicamentos. Informe cualquier efecto secundario durante la infusin a su profesional de KB Home	[place]. La fiebre y los escalofros por lo general no suceden con las infusiones posteriores. No debe quedar embarazada mientras est tomando este medicamento o por 7 meses despus de dejar de usarlo. Las mujeres deben informar a su mdico si estn buscando quedar embarazadas o si creen que estn embarazadas. Las mujeres con la posibilidad de Best boy nios deben tener una prueba de embarazo negativa antes de Art gallery manager a tomar este medicamento. Existe la posibilidad de que ocurran efectos secundarios graves a un beb sin nacer. Para ms informacin hable con su profesional de la salud o su farmacutico. No debe amamantar a un beb mientras est tomando este medicamento o por 7 meses despus de dejar de usarlo. Las [employer] deben usar un mtodo anticonceptivo eficaz con este medicamento. Qu efectos secundarios puedo tener al [employer] este medicamento? Efectos secundarios que debe informar a su mdico o a Barrister's clerk de la salud tan pronto como sea posible: Chief of Staff, como erupcin cutnea, comezn/picazn o urticaria, e hinchazn de la cara, los labios o la lengua dolor en el pecho o palpitaciones tos mareos sensacin de Youth worker o aturdimiento, cadas fiebre sensacin general de estar enfermo o sntomas gripales signos de empeoramiento de la insuficiencia cardiaca, tales como problemas respiratorios; hinchazn en las piernas y los pies cansancio o debilidad inusual Efectos secundarios que generalmente no requieren atencin mdica (infrmelos a su mdico o a Barrister's clerk de la salud si persisten o si son molestos): dolor de Psychiatric nurse sentido del gusto diarrea dolor en las articulaciones nuseas, vmito prdida de peso Puede ser que esta lista no menciona todos los posibles efectos secundarios. Comunquese a su  mdico por asesoramiento mdico [employer]. Usted puede informar los efectos secundarios a la FDA por telfono al 1-800-FDA-1088. Dnde debo guardar mi medicina? Este medicamento se administra en hospitales o clnicas y no necesitar guardarlo en su domicilio. ATENCIN: Este folleto es un resumen. Puede ser que no cubra toda la posible informacin. Si usted tiene preguntas acerca de esta medicina, consulte con su mdico, su farmacutico o su profesional de Technical sales engineer.  2020 Elsevier/Gold Standard (2016-08-23 00:00:00)  Pertuzumab injection Qu es este medicamento? El PERTUZUMAB es un anticuerpo monoclonal. Genene Churn para tratar el cncer de mama. Este medicamento puede ser utilizado para otros usos; si tiene alguna pregunta consulte con su proveedor de atencin mdica o con su farmacutico. MARCAS COMUNES: PERJETA Qu le debo informar a mi profesional de la salud antes de tomar este medicamento? Necesita saber si usted presenta alguno de los siguientes problemas o situaciones: enfermedad cardiaca insuficiencia cardiaca alta presin sangunea antecedentes de pulso cardiaca irregular radioterapia reciente o continuada una reaccin alrgica o inusual al pertuzumab, a otros medicamentos, alimentos, colorantes o conservantes si est embarazada o buscando quedar embarazada si est amamantando a un beb Cmo debo utilizar este medicamento? Este medicamento se administra mediante infusin por va intravenosa. Lo administra un profesional de Technical sales engineer en un hospital o en un entorno clnico. Hable con su pediatra para informarse acerca del uso de este medicamento en nios. Puede requerir atencin especial. Sobredosis: Pngase en contacto inmediatamente con un centro toxicolgico o una sala de urgencia si usted cree que haya tomado demasiado medicamento. ATENCIN: [employer] es solo para usted. No comparta este medicamento con nadie. Qu sucede si  me olvido de una dosis? Es  importante no olvidar ninguna dosis. Informe a su mdico o a su profesional de la salud si no puede asistir a Photographer. Qu puede interactuar con este medicamento? No se esperan interacciones. Puede ser que esta lista no menciona todas las posibles interacciones. Informe a su profesional de KB Home	[place] de [employer] productos a base de hierbas, medicamentos de [place] o suplementos nutritivos que est tomando. Si usted fuma, consume bebidas alcohlicas o si utiliza drogas ilegales, indqueselo tambin a su profesional de KB Home	[place]. Algunas sustancias pueden interactuar con su medicamento. A qu debo estar atento al usar Coca-Cola? Se supervisar su estado de salud atentamente mientras reciba este medicamento. Si presenta algn efecto secundario, infrmelo. Sin embargo, contine con el tratamiento aun si se siente enfermo, a menos que su mdico le indique que lo suspenda. No debe quedar embarazada mientras est tomando este medicamento o por 7 meses despus de dejar de usarlo. Las mujeres deben informar a su mdico si estn buscando quedar embarazadas o si creen que estn embarazadas. Las mujeres con la posibilidad de Best boy nios deben tener una prueba de embarazo negativa antes de Art gallery manager a tomar este medicamento. Existe la posibilidad de que ocurran efectos secundarios graves a un beb sin nacer. Para ms informacin hable con su profesional de la salud o su farmacutico. No debe amamantar a un beb mientras est tomando este medicamento o por 7 meses despus de dejar de usarlo. Las [employer] deben usar un mtodo anticonceptivo eficaz con este medicamento. Consulte a su mdico o a su profesional de la salud si tiene fiebre, escalofros, dolor de garganta, o cualquier otro sntoma de resfro o gripe. No se trate usted mismo. Trate de no acercarse a personas que estn enfermas. Es posible que tenga [place], escalofros y dolor de cabeza durante la infusin. Informe cualquier efecto secundario durante la  infusin a su profesional de KB Home	[place]. Qu efectos secundarios puedo tener al [employer] este medicamento? Efectos secundarios que debe informar a su mdico o a Barrister's clerk de la salud tan pronto como sea posible: Arboriculturist o palpitaciones mareos sensacin de Youth worker o aturdimiento fiebre o escalofros erupcin cutnea, picazn o urticaria dolor de garganta hinchazn de la cara, labios o lengua hinchazn de piernas o tobillos cansancio o debilidad inusual Efectos secundarios que generalmente no requieren atencin mdica (infrmelos a su mdico o a su profesional de la salud si persisten o si son molestos): diarrea cada del cabello nuseas, vmito cansancio Puede ser que esta lista no menciona todos los posibles efectos secundarios. Comunquese a su mdico por asesoramiento mdico [employer]. Usted puede informar los efectos secundarios a la FDA por telfono al 1-800-FDA-1088. Dnde debo guardar mi medicina? Este medicamento se administra en hospitales o clnicas y no necesitar guardarlo en su domicilio. ATENCIN: Este folleto es un resumen. Puede ser que no cubra toda la posible informacin. Si usted tiene preguntas acerca de esta medicina, consulte con su mdico, su farmacutico o su profesional de Technical sales engineer.  2020 Elsevier/Gold Standard (2016-07-12 00:00:00)   Docetaxel injection Qu es este medicamento? El DOCETAXEL es un agente quimioteraputico. Este medicamento acta sobre las clulas que se dividen rpidamente, como las clulas cancergenas, y finalmente provoca la muerte de estas clulas. Se utiliza en el tratamiento de muchos tipos de cncer como el cncer de mama, adenocarcinoma gstrico, cabeza y cuello, pulmn y prstata. Este medicamento puede ser utilizado para otros usos; si tiene  alguna pregunta consulte con su proveedor de atencin mdica o con su farmacutico. MARCAS COMUNES: Docefrez, Taxotere Qu le debo informar a mi  profesional de la salud antes de tomar este medicamento? Necesita saber si usted presenta alguno de los siguientes problemas o situaciones:  infeccin (especialmente infecciones virales, como varicela o herpes)  enfermedad heptica  conteos sanguneos bajos, como baja cantidad de glbulos blancos, glbulos rojos y plaquetas  una reaccin alrgica o inusual al docetaxel, polisorbato 80, a otros agentes quimioteraputicos, a otros medicamentos, alimentos, colorantes o conservantes  si est embarazada o buscando quedar embarazada  si est amamantando a un beb Cmo debo utilizar este medicamento? Este medicamento se administra como infusin en una vena. Un profesional de la salud especialmente capacitado lo administra en un hospital o clnica. Hable con su pediatra para informarse acerca del uso de este medicamento en nios. Puede requerir atencin especial. Sobredosis: Pngase en contacto inmediatamente con un centro toxicolgico o una sala de urgencia si usted cree que haya tomado demasiado medicamento. ATENCIN: [employer] es solo para usted. No comparta este medicamento con nadie. Qu sucede si me olvido de una dosis? Es importante no olvidar ninguna dosis. Informe a su mdico o a su profesional de la salud si no puede asistir a Photographer. Qu puede interactuar con este medicamento? aprepitant ciertos antibiticos, tales como eritromicina o claritromicina ciertos medicamentos antivirales para VIH o hepatitis ciertos medicamentos para infecciones micticas, tales como fluconazol, itraconazol, ketoconazol, posaconazol o voriconazol cimetidina ciprofloxacino conivaptn ciclosporina dronedarona fluvoxamina jugo de toronja (pomelo) imatinib verapamilo Puede ser que esta lista no menciona todas las posibles interacciones. Informe a su profesional de KB Home	[place] de [employer] productos a base de hierbas, medicamentos de [place] o suplementos nutritivos que est tomando. Si usted fuma,  consume bebidas alcohlicas o si utiliza drogas ilegales, indqueselo tambin a su profesional de KB Home	[place]. Algunas sustancias pueden interactuar con su medicamento. A qu debo estar atento al usar Coca-Cola? Se supervisar su estado de salud atentamente mientras reciba este medicamento. Tendr que hacerse anlisis de sangre importantes mientras est usando este medicamento. Llame a su mdico o a su profesional de la salud si tiene fiebre, escalofros o dolor de garganta, o cualquier otro sntoma de resfro o gripe. No se trate usted mismo. Este medicamento reduce la capacidad del cuerpo para combatir infecciones. Trate de no acercarse a personas que estn enfermas. Algunos productos pueden contener alcohol. Pregunte a su profesional de la salud si este medicamento contiene alcohol. Asegrese de informar a todos los profesionales de la salud que usted est usando [place]. Ciertos medicamentos, como metronidazol y disulfirm, pueden causar una reaccin desagradable cuando se usan con alcohol. Esta reaccin incluye enrojecimiento, dolor de cabeza, nuseas, vmitos, sudoracin y aumento de la sed. La reaccin puede durar de 30 minutos a varias horas. Puede experimentar somnolencia o mareos. No conduzca, no utilice maquinaria ni haga nada que Associate Professor en estado de alerta hasta que sepa cmo le afecta este medicamento. No se siente ni se ponga de pie con rapidez, especialmente si es un paciente de edad avanzada. Esto reduce el riesgo de mareos o [employer]. El alcohol puede interferir con el efecto de este medicamento. Hable con su profesional de la salud sobre su riesgo de Hotel manager. Usted puede tener mayor riesgo para ciertos tipos de cncer si [place] este medicamento. No debe quedar embarazada mientras est usando este medicamento o por 6 meses despus de dejar de usarlo. Las mujeres deben informar a  su mdico si estn buscando quedar embarazadas o si creen que podran estar embarazadas.  Existe la posibilidad deEfectos secundarios graves en un beb sin nacer. Para obtener ms informacin, hable con su profesional de la salud o su farmacutico. No debe Economist a un beb mientras est usando este medicamento o por 1 semana despus de dejar de usarlo. Los hombres que reciben este medicamento deben usar un condn durante las relaciones sexuales con mujeres que puedan quedar embarazadas. Si usted embaraza a [employer], el beb podra tener defectos de nacimiento. El beb podra morir antes de Associate Professor. Deber seguir usando condn durante 3 meses despus de suspender el medicamento. Informe a su proveedor de Geophysical data processor de inmediato si su pareja queda embarazada mientras usted est [employer]. Esto puede interferir con la capacidad de los hombres para Social worker a Musician. Usted debe hablar con su mdico o su profesional de la salud si est preocupado por su fertilidad. Qu efectos secundarios puedo tener al [employer] este medicamento? Efectos secundarios que debe informar a su mdico o a Barrister's clerk de la salud tan pronto como sea posible: Chief of Staff, como erupcin cutnea, comezn/picazn o urticaria, e hinchazn de la cara, los labios o la lengua visin borrosa problemas para respirar cambios en la visin conteos sanguneos bajos: este frmaco podra reducir la cantidad de glbulos blancos, glbulos rojos y plaquetas. Su riesgo de infeccin y sangrado podra ser mayor. nuseas y Proofreader, enrojecimiento o Actor de Air cabin crew, hormigueo o entumecimiento de las manos o los pies enrojecimiento, formacin de Nurse, children's, descamacin o distensin de la piel, incluso dentro de la boca signos de disminucin en la cantidad de plaquetas o sangrado: moretones, puntos rojos en la piel, heces de color negro y aspecto alquitranado, sangrado por la nariz signos de disminucin en la cantidad de glbulos rojos: cansancio o debilidad inusual, Youth worker,  aturdimiento signos de infeccin: fiebre o escalofros, tos, Social research officer, government de garganta, Social research officer, government o dificultad para orinar hinchazn de tobillos, pies, manos Efectos secundarios que generalmente no requieren atencin mdica (infrmelos a su mdico o a Barrister's clerk de la salud si persisten o si son molestos): estreimiento diarrea cambios en las uas de las manos o de los pies cada del cabello prdida del apetito llagas en la boca Marketing executive Puede ser que esta lista no menciona todos los posibles efectos secundarios. Comunquese a su mdico por asesoramiento mdico [employer]. Usted puede informar los efectos secundarios a la FDA por telfono al 1-800-FDA-1088. Dnde debo guardar mi medicina? Este medicamento se administra en hospitales o clnicas y no necesitar guardarlo en su domicilio. ATENCIN: Este folleto es un resumen. Puede ser que no cubra toda la posible informacin. Si usted tiene preguntas acerca de esta medicina, consulte con su mdico, su farmacutico o su profesional de Technical sales engineer.  2020 Elsevier/Gold Standard (2019-04-15 00:00:00)  Carboplatin injection Qu es este medicamento? El CARBOPLATINO es un agente quimioteraputico. Este medicamento acta sobre las clulas que se dividen rpidamente, como las clulas cancergenas, y finalmente provoca la muerte de estas clulas. Se utiliza en el tratamiento del cncer de ovario y muchos otros tipos de Hotel manager. Este medicamento puede ser utilizado para otros usos; si tiene alguna pregunta consulte con su proveedor de atencin mdica o con su farmacutico. MARCAS COMUNES: Paraplatin Qu le debo informar a mi profesional de la salud antes de tomar este medicamento? Necesita saber si usted presenta alguno de los siguientes problemas o situaciones:  trastornos sanguneos  problemas auditivos  enfermedad renal  radioterapia reciente o continuada  una reaccin alrgica o inusual al carboplatino, al cisplatino, a otros agentes  quimioteraputicos, a otros medicamentos, alimentos, colorantes o conservantes  si est embarazada o buscando quedar embarazada  si est amamantando a un beb Cmo debo [employer]? Este medicamento se administra normalmente mediante infusin por va intravenosa. Lo administra un profesional de la salud calificado en un hospital o en un entorno clnico. Hable con su pediatra para informarse acerca del uso de este medicamento en nios. Puede requerir atencin especial. Sobredosis: Pngase en contacto inmediatamente con un centro toxicolgico o una sala de urgencia si usted cree que haya tomado demasiado medicamento. ATENCIN: [employer] es solo para usted. No comparta este medicamento con nadie. Qu sucede si me olvido de una dosis? Es importante no olvidar ninguna dosis. Informe a su mdico o a su profesional de la salud si no puede asistir a Photographer. Qu puede interactuar con este medicamento?  medicamentos para convulsiones  medicamentos para incrementar los conteos sanguneos, tales como filgrastim, pegfilgrastim, sargramostim  ciertos antibiticos, tales como amicacina, gentamicina, neomicina, estreptomicina, tobramicina  vacunas Consulte a su mdico o a su profesional de la salud antes de tomar cualquiera de los siguientes medicamentos:  acetaminofeno  aspirina  ibuprofeno  quetoprofeno  naproxeno Puede ser que esta lista no menciona todas las posibles interacciones. Informe a su profesional de KB Home	[place] de [employer] productos a base de hierbas, medicamentos de [place] o suplementos nutritivos que est tomando. Si usted fuma, consume bebidas alcohlicas o si utiliza drogas ilegales, indqueselo tambin a su profesional de KB Home	[place]. Algunas sustancias pueden interactuar con su medicamento. A qu debo estar atento al usar Coca-Cola? Se supervisar su estado de salud atentamente mientras reciba este medicamento. Tendr que hacerse anlisis de  sangre peridicos mientras est tomando este medicamento. Este medicamento puede hacerle sentir un Nurse, mental health. Esto es normal ya que la quimioterapia afecta tanto a las clulas sanas como a las clulas cancerosas. Si presenta alguno de los [employer], infrmelos. Sin embargo, contine con el tratamiento aun si se siente enfermo, a menos que su mdico le indique que lo suspenda. En algunos casos, podr recibir [employer] para ayudarle con los efectos secundarios. Siga las instrucciones para usarlos. Consulte a su mdico o a su profesional de la salud por asesoramiento si tiene fiebre, escalofros, dolor de garganta o cualquier otro sntoma de resfro o gripe. No se trate usted mismo. Este medicamento puede reducir la capacidad del cuerpo para combatir infecciones. Trate de no acercarse a personas que estn enfermas. [employer] puede aumentar el riesgo de magulladuras o sangrado. Consulte a su mdico o a su profesional de la salud si observa sangrados inusuales. Proceda con cuidado al cepillar sus dientes, usar hilo dental o Risk manager palillos para los dientes, ya que puede contraer una infeccin o Therapist, art con mayor facilidad. Si se somete a algn tratamiento dental, informe a su dentista que est [employer]. Evite tomar productos que contienen aspirina, acetaminofeno, ibuprofeno, naproxeno o quetoprofeno a menos que as lo indique su mdico. Estos productos pueden disimular la fiebre. No se debe quedar embarazada mientras recibe este medicamento. Las mujeres deben informar a su mdico si estn buscando quedar embarazadas o si creen que estn embarazadas. Existe la posibilidad de efectos secundarios graves a un beb sin nacer. Para ms informacin hable con su profesional de la salud o su farmacutico. No debe amamantar a un beb mientras est  usando [employer]a. Qu efectos secundarios puedo tener al [employer] este medicamento? Efectos secundarios que debe  informar a su mdico o a Barrister's clerk de la salud tan pronto como sea posible:  Chief of Staff como erupcin cutnea, picazn o urticarias, hinchazn de la cara, labios o lengua  signos de infeccin - fiebre o escalofros, tos, dolor de garganta, dolor o dificultad para orinar  signos de reduccin de plaquetas o sangrado - magulladuras, puntos rojos en la piel, heces de color oscuro o con aspecto alquitranado, sangrando por la nariz  signos de reduccin de glbulos rojos - cansancio o debilidad inusual, desmayos, sensacin de [employer]  problemas respiratorios  cambios de audicin  cambios en la visin  dolor en el pecho  alta presin sangunea  conteos sanguneos bajos - Este medicamento puede reducir la cantidad de glbulos blancos, glbulos rojos y plaquetas. Su riesgo de infeccin y [place].  nuseas, vmito  dolor, enrojecimiento, hinchazn o irritacin en el lugar de la inyeccin  dolor, hormigueo, entumecimiento de manos o pies  problemas de coordinacin, del habla, al caminar  dificultad para orinar o cambios en el volumen de orina Efectos secundarios que, por lo general, no requieren atencin mdica (debe informarlos a su mdico o a su profesional de la salud si persisten o si son molestos):  cada del cabello  prdida del apetito  sabor metlico o cambios en el sentido del gusto Puede ser que esta lista no menciona todos los posibles efectos secundarios. Comunquese a su mdico por asesoramiento mdico [employer] Usted puede informar los efectos secundarios a la FDA por telfono al 1-800-FDA-1088. Dnde debo guardar mi medicina? Este medicamento se administra en hospitales o clnicas y no necesitar guardarlo en su domicilio. ATENCIN: Este folleto es un resumen. Puede ser que no cubra toda la posible informacin. Si usted tiene preguntas acerca de esta medicina, consulte con su mdico, su farmacutico o su profesional de Marketing executive.  2020 Elsevier/Gold Standard (2014-08-03 00:00:00)

## 2020-03-30 ENCOUNTER — Telehealth: Payer: Self-pay | Admitting: *Deleted

## 2020-03-31 ENCOUNTER — Other Ambulatory Visit: Payer: Self-pay

## 2020-03-31 ENCOUNTER — Inpatient Hospital Stay: Payer: Self-pay

## 2020-03-31 VITALS — BP 111/65 | HR 99 | Resp 18

## 2020-03-31 DIAGNOSIS — Z170 Estrogen receptor positive status [ER+]: Secondary | ICD-10-CM

## 2020-03-31 MED ORDER — PEGFILGRASTIM-CBQV 6 MG/0.6ML ~~LOC~~ SOSY
PREFILLED_SYRINGE | SUBCUTANEOUS | Status: AC
  Filled 2020-03-31: qty 0.6

## 2020-03-31 MED ORDER — PEGFILGRASTIM-CBQV 6 MG/0.6ML ~~LOC~~ SOSY
6.0000 mg | PREFILLED_SYRINGE | Freq: Once | SUBCUTANEOUS | Status: AC
  Administered 2020-03-31: 6 mg via SUBCUTANEOUS

## 2020-03-31 NOTE — Patient Instructions (Signed)

## 2020-04-01 ENCOUNTER — Encounter: Payer: Self-pay | Admitting: Oncology

## 2020-04-01 ENCOUNTER — Other Ambulatory Visit (HOSPITAL_COMMUNITY): Payer: No Typology Code available for payment source

## 2020-04-01 NOTE — Progress Notes (Signed)
Patient brought proof CAFA application.  Sent via [employer] to [employer] Attn: Therapist, art. They will review application and notify patient via mail determination and or if anything else is needed.

## 2020-04-05 ENCOUNTER — Encounter: Payer: Self-pay | Admitting: *Deleted

## 2020-04-05 ENCOUNTER — Inpatient Hospital Stay (HOSPITAL_BASED_OUTPATIENT_CLINIC_OR_DEPARTMENT_OTHER): Payer: No Typology Code available for payment source | Admitting: Oncology

## 2020-04-05 ENCOUNTER — Other Ambulatory Visit: Payer: Self-pay

## 2020-04-05 ENCOUNTER — Other Ambulatory Visit: Payer: No Typology Code available for payment source

## 2020-04-05 ENCOUNTER — Inpatient Hospital Stay: Payer: No Typology Code available for payment source

## 2020-04-05 ENCOUNTER — Inpatient Hospital Stay: Payer: Self-pay

## 2020-04-05 VITALS — BP 112/66 | HR 100 | Temp 98.2°F | Resp 18 | Ht <= 58 in | Wt 150.2 lb

## 2020-04-05 DIAGNOSIS — C50411 Malignant neoplasm of upper-outer quadrant of right female breast: Secondary | ICD-10-CM

## 2020-04-05 DIAGNOSIS — Z170 Estrogen receptor positive status [ER+]: Secondary | ICD-10-CM

## 2020-04-05 DIAGNOSIS — Z95828 Presence of other vascular implants and grafts: Secondary | ICD-10-CM

## 2020-04-05 LAB — CBC WITH DIFFERENTIAL/PLATELET
Abs Immature Granulocytes: 1.86 10*3/uL — ABNORMAL HIGH (ref 0.00–0.07)
Basophils Absolute: 0 10*3/uL (ref 0.0–0.1)
Basophils Relative: 0 %
Eosinophils Absolute: 0.1 10*3/uL (ref 0.0–0.5)
Eosinophils Relative: 0 %
HCT: 39.2 % (ref 36.0–46.0)
Hemoglobin: 13.5 g/dL (ref 12.0–15.0)
Immature Granulocytes: 9 %
Lymphocytes Relative: 20 %
Lymphs Abs: 4.2 10*3/uL — ABNORMAL HIGH (ref 0.7–4.0)
MCH: 30 pg (ref 26.0–34.0)
MCHC: 34.4 g/dL (ref 30.0–36.0)
MCV: 87.1 fL (ref 80.0–100.0)
Monocytes Absolute: 3.3 10*3/uL — ABNORMAL HIGH (ref 0.1–1.0)
Monocytes Relative: 16 %
Neutro Abs: 11.2 10*3/uL — ABNORMAL HIGH (ref 1.7–7.7)
Neutrophils Relative %: 55 %
Platelets: 231 10*3/uL (ref 150–400)
RBC: 4.5 MIL/uL (ref 3.87–5.11)
RDW: 11.9 % (ref 11.5–15.5)
WBC: 20.7 10*3/uL — ABNORMAL HIGH (ref 4.0–10.5)
nRBC: 0 % (ref 0.0–0.2)

## 2020-04-05 LAB — COMPREHENSIVE METABOLIC PANEL
ALT: 60 U/L — ABNORMAL HIGH (ref 0–44)
AST: 43 U/L — ABNORMAL HIGH (ref 15–41)
Albumin: 3.5 g/dL (ref 3.5–5.0)
Alkaline Phosphatase: 76 U/L (ref 38–126)
Anion gap: 7 (ref 5–15)
BUN: 11 mg/dL (ref 6–20)
CO2: 27 mmol/L (ref 22–32)
Calcium: 8.5 mg/dL — ABNORMAL LOW (ref 8.9–10.3)
Chloride: 99 mmol/L (ref 98–111)
Creatinine, Ser: 0.67 mg/dL (ref 0.44–1.00)
GFR, Estimated: >60 mL/min (ref >60)
Glucose, Bld: 93 mg/dL (ref 70–99)
Potassium: 3.9 mmol/L (ref 3.5–5.1)
Sodium: 133 mmol/L — ABNORMAL LOW (ref 135–145)
Total Bilirubin: 0.4 mg/dL (ref 0.3–1.2)
Total Protein: 6.2 g/dL — ABNORMAL LOW (ref 6.5–8.1)

## 2020-04-05 MED ORDER — HEPARIN SOD (PORK) LOCK FLUSH 100 UNIT/ML IV SOLN
500.0000 [IU] | Freq: Once | INTRAVENOUS | Status: AC
  Administered 2020-04-05: 500 [IU] via INTRAVENOUS
  Filled 2020-04-05: qty 5

## 2020-04-05 MED ORDER — SODIUM CHLORIDE 0.9% FLUSH
10.0000 mL | Freq: Once | INTRAVENOUS | Status: AC
  Administered 2020-04-05: 10 mL via INTRAVENOUS
  Filled 2020-04-05: qty 10

## 2020-04-05 NOTE — Progress Notes (Addendum)
[place]  [phone] [phone]     ID: Jill [name] DOB: 12-Jun-1976  MR#: [id number]  GEZ#:[id number]  Patient Care Team: Gildardo Pounds, NP as PCP - General (Nurse Practitioner) , Virgie Dad, MD as Consulting Physician (Oncology) Jovita Kussmaul, MD as Consulting Physician (General Surgery) Dillingham, Loel Lofty, DO as Attending Physician (Plastic Surgery) Mauro Kaufmann, RN as Oncology Nurse Navigator Rockwell Germany, RN as Oncology Nurse Navigator Chauncey Cruel, MD OTHER MD:  CHIEF COMPLAINT: triple positive breast cancer  CURRENT TREATMENT: Adjuvant chemotherapy   INTERVAL HISTORY: Jill [name] returns today for follow up and treatment of her triple positive breast cancer, accompanied by her interpreter.   She began adjuvant chemotherapy, consisting of carboplatin, docetaxel, trastuzumab and pertuzumab every 21 days x6, on 03/29/2020.  Today is day 8 cycle 1  She received udenyca day 3  Her most recent echocardiogram on 03/18/2020 showed an ejection fraction of 60-65%.   REVIEW OF SYSTEMS: Jill [name] did remarkably well with her first cycle of chemotherapy.  She had no nausea or vomiting.  She really had no problems for the first several days and received your Udenyca on day 3 without initial complications.  By Sunday, day 6, she was feeling tired.  Also she had had no bowel movement in the past 5 days.  She took a little bit of all of oil and that worked for her but she remained feeling a little bit weak.  She had a sore mouth, but no mouth sores.  Also on Sunday she developed a throbbing pain in her lower back and also neck.  This is likely a delayed reaction to the [place].  Aside from these issues a detailed review of systems today was stable.   HISTORY OF CURRENT ILLNESS: From the original intake note:  "Jill [name]" herself palpated a right breast abnormality sometime in 2019.,  She did not pay much mind.  This was  noted again during her pregnancy and she underwent bilateral diagnostic mammography with tomography and right breast ultrasonography at [facility] on 01/21/2020 showing: breast density category C; 4.1 cm irregular mass with associated group of pleomorphic calcifications measuring approximately 4.9 cm, located at site of palpable concern in upper-outer right breast at 9:30; no right axillary lymphadenopathy.  Accordingly on 02/04/2020 she proceeded to biopsy of the right breast area in question. The pathology from this procedure ([id number]) showed: invasive and in situ mammary carcinoma, e-cadherin positive, grade 2. Prognostic indicators significant for: estrogen receptor, 100% positive and progesterone receptor, 100% positive, both with strong staining intensity. Proliferation marker Ki67 at 20%. HER2 equivocal by immunohistochemistry (2+), but positive by fluorescent in situ hybridization with a signals ratio 5.39 and number per cell 11.05.  The patient's subsequent history is as detailed below.   PAST MEDICAL HISTORY: Past Medical History:  Diagnosis Date  . Infected cyst of Bartholin's gland duct   . Medical history non-contributory     PAST SURGICAL HISTORY: Past Surgical History:  Procedure Laterality Date  . IR IMAGING GUIDED PORT INSERTION  03/02/2020  . MASTECTOMY W/ SENTINEL NODE BIOPSY Right 03/07/2020   Procedure: RIGHT MASTECTOMY WITH SENTINEL LYMPH NODE BIOPSY;  Surgeon: Jovita Kussmaul, MD;  Location: [place];  Service: General;  Laterality: Right;  . NO PAST SURGERIES    . WISDOM TOOTH EXTRACTION      FAMILY HISTORY: Family History  Problem Relation Age of Onset  . Diabetes Maternal Grandmother   .  Hypertension Mother   The patient's father is 57 and her mother 32 as of August 2021.  The patient is 3 brothers and 2 sisters.  There is no history of cancer in the family to her knowledge   GYNECOLOGIC HISTORY:  No LMP recorded. (Menstrual status:  IUD). Menarche: 44 years old Age at first live birth: 44 years old [place] P 4 LMP irregular Contraceptive: Mirena IUD in place HRT n/a  Hysterectomy? no BSO? 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  Her significant other [name] works Occupational hygienist.  They are both from [place].  The patient's children are ages 19, 57, 45, and 32 months as of August 2021.  The older 3 children are from a different father and their last name is [name].  (The patient was not married to Mr. [name] so there is no legal issue regarding access to medical records, etc.). The 66-monthold is a child of Jill [name]  The patient attends a local ERobinson MillDIRECTIVES: Not in place   HEALTH MAINTENANCE: Social History   Tobacco Use  . Smoking status: Never Smoker  . Smokeless tobacco: Never Used  Vaping Use  . Vaping Use: Never used  Substance Use Topics  . Alcohol use: Never  . Drug use: Never     Colonoscopy: n/a (age)  PAP: 10/2018, negative  Bone density: n/a (age)   No Known Allergies  Current Outpatient Medications  Medication Sig Dispense Refill  . acetaminophen (TYLENOL) 500 MG tablet Take 500 mg by mouth every 6 (six) hours as needed for moderate pain or headache.    . dexamethasone (DECADRON) 4 MG tablet Take 2 tablets (8 mg total) by mouth 2 (two) times daily. Take decadron  Twice daily the day before taxotere then daily x 3 days after carboplatin  chemo 30 tablet 1  . HYDROcodone-acetaminophen (NORCO/VICODIN) 5-325 MG tablet Take 1-2 tablets by mouth every 6 (six) hours as needed for moderate pain or severe pain. 15 tablet 0  . ibuprofen (ADVIL) 200 MG tablet Take 400 mg by mouth every 6 (six) hours as needed for headache or moderate pain.    .Marland Kitchenlidocaine-prilocaine (EMLA) cream Apply to affected area once (Patient not taking: Reported on  03/25/2020) 30 g 3  . loratadine (CLARITIN) 10 MG tablet Take 1 tablet (10 mg total) by mouth daily. 60 tablet 6  . LORazepam (ATIVAN) 0.5 MG tablet Take 1 tablet (0.5 mg total) by mouth at bedtime as needed (Nausea or vomiting). (Patient not taking: Reported on 03/25/2020) 20 tablet 0  . methocarbamol (ROBAXIN) 750 MG tablet Take 1 tablet (750 mg total) by mouth 4 (four) times daily as needed (use for muscle cramps/pain). 30 tablet 2  . prochlorperazine (COMPAZINE) 10 MG tablet Take 1 tablet (10 mg total) by mouth every 6 (six) hours as needed (Nausea or vomiting). 30 tablet 1   No current facility-administered medications for this visit.    OBJECTIVE: Spanish speaker who appears well  Vitals:   04/05/20 0824  BP: 112/66  Pulse: 100  Resp: 18  Temp: 98.2 F (36.8 C)  SpO2: 100%     Body mass index is 31.39 kg/m.   Wt Readings from Last 3 Encounters:  04/05/20 150 lb 3.2 oz (68.1 kg)  03/28/20 150 lb 12.8 oz (68.4 kg)  03/25/20 152 lb 6.4 oz (  69.1 kg)      ECOG FS:1 - Symptomatic but completely ambulatory  Sclerae unicteric, EOMs intact Full upper plate.  No mouth sores noted No cervical or supraclavicular adenopathy Lungs no rales or rhonchi Heart regular rate and rhythm Abd soft, nontender, positive bowel sounds MSK no focal spinal tenderness, no upper extremity lymphedema Neuro: nonfocal, well oriented, appropriate affect Breasts: Status post right mastectomy.  No evidence of local recurrence.  Left breast is benign.  Both axillae are benign.   LAB RESULTS:  CMP     Component Value Date/Time   NA 141 03/28/2020 0813   NA 138 12/15/2019 1543   K 3.8 03/28/2020 0813   CL 110 03/28/2020 0813   CO2 25 03/28/2020 0813   GLUCOSE 100 (H) 03/28/2020 0813   BUN 12 03/28/2020 0813   BUN 10 12/15/2019 1543   CREATININE 0.70 03/28/2020 0813   CREATININE 0.77 02/22/2020 1353   CALCIUM 8.9 03/28/2020 0813   PROT 6.7 03/28/2020 0813   PROT 7.3 12/15/2019 1543   ALBUMIN 3.7  03/28/2020 0813   ALBUMIN 4.5 12/15/2019 1543   AST 14 (L) 03/28/2020 0813   AST 13 (L) 02/22/2020 1353   ALT 11 03/28/2020 0813   ALT 8 02/22/2020 1353   ALKPHOS 50 03/28/2020 0813   BILITOT 0.4 03/28/2020 0813   BILITOT 0.3 02/22/2020 1353   GFRNONAA >60 03/28/2020 0813   GFRNONAA >60 02/22/2020 1353   GFRAA >60 03/28/2020 0813   GFRAA >60 02/22/2020 1353    No results found for: TOTALPROTELP, ALBUMINELP, A1GS, A2GS, BETS, BETA2SER, GAMS, MSPIKE, SPEI  Lab Results  Component Value Date   WBC 5.7 03/28/2020   NEUTROABS 2.9 03/28/2020   HGB 13.1 03/28/2020   HCT 38.3 03/28/2020   MCV 89.1 03/28/2020   PLT 422 (H) 03/28/2020    No results found for: LABCA2  No components found for: VFIEPP295  No results for input(s): INR in the last 168 hours.  No results found for: LABCA2  No results found for: JOA416  No results found for: SAY301  No results found for: SWF093  No results found for: CA2729  No components found for: HGQUANT  No results found for: CEA1 / No results found for: CEA1   No results found for: AFPTUMOR  No results found for: CHROMOGRNA  No results found for: KPAFRELGTCHN, LAMBDASER, KAPLAMBRATIO (kappa/lambda light chains)  Lab Results  Component Value Date   HGBA 97.0 10/10/2018   (Hemoglobinopathy evaluation)   No results found for: LDH  No results found for: IRON, TIBC, IRONPCTSAT (Iron and TIBC)  No results found for: FERRITIN  Urinalysis    Component Value Date/Time   COLORURINE YELLOW 12/31/2017 2039   APPEARANCEUR CLOUDY (A) 12/31/2017 2039   LABSPEC 1.015 12/31/2017 2039   PHURINE 5.0 12/31/2017 2039   GLUCOSEU NEGATIVE 12/31/2017 2039   HGBUR MODERATE (A) 12/31/2017 2039   BILIRUBINUR NEGATIVE 12/31/2017 2039   KETONESUR NEGATIVE 12/31/2017 2039   PROTEINUR NEGATIVE 12/31/2017 2039   NITRITE NEGATIVE 12/31/2017 2039   LEUKOCYTESUR LARGE (A) 12/31/2017 2039     STUDIES: NM Sentinel Node Inj-No Rpt (Breast)  Result  Date: 03/07/2020 Sulfur colloid was injected by the nuclear medicine technologist for melanoma sentinel node.   ECHOCARDIOGRAM COMPLETE  Result Date: 03/19/2020    ECHOCARDIOGRAM REPORT   Patient Name:   [facility] Date of Exam: 03/18/2020 Medical Rec #:  [id number]  Height:       58.0 in Accession #:    [id number]                       Weight:       149.9 lb Date of Birth:  31-Dec-1975                       BSA:          1.611 m Patient Age:    43 years                         BP:           102/77 mmHg Patient Gender: F                                HR:           87 bpm. Exam Location:  Inpatient Procedure: 2D Echo Indications:    chemo evaluation  History:        Patient has no prior history of Echocardiogram examinations.                 Breast cancer.  Sonographer:    Jannett Celestine RDCS (AE) Referring Phys: [place]  1. Left ventricular ejection fraction, by estimation, is 60 to 65%. The left ventricle has normal function. The left ventricle has no regional wall motion abnormalities. Left ventricular diastolic parameters were normal. The average left ventricular global longitudinal strain is -19.3 %.  2. Right ventricular systolic function is normal. The right ventricular size is normal. Tricuspid regurgitation signal is inadequate for assessing PA pressure.  3. The mitral valve is normal in structure. No evidence of mitral valve regurgitation.  4. The aortic valve was not well visualized. Aortic valve regurgitation is not visualized. No aortic stenosis is present.  5. The inferior vena cava is normal in size with greater than 50% respiratory variability, suggesting right atrial pressure of 3 mmHg. FINDINGS  Left Ventricle: Left ventricular ejection fraction, by estimation, is 60 to 65%. The left ventricle has normal function. The left ventricle has no regional wall motion abnormalities. The average left ventricular global longitudinal  strain is -19.3 %. The left ventricular internal cavity size was normal in size. There is no left ventricular hypertrophy. Left ventricular diastolic parameters were normal. Right Ventricle: The right ventricular size is normal. No increase in right ventricular wall thickness. Right ventricular systolic function is normal. Tricuspid regurgitation signal is inadequate for assessing PA pressure. Left Atrium: Left atrial size was normal in size. Right Atrium: Right atrial size was normal in size. Pericardium: There is no evidence of pericardial effusion. Mitral Valve: The mitral valve is normal in structure. No evidence of mitral valve regurgitation. Tricuspid Valve: The tricuspid valve is grossly normal. Tricuspid valve regurgitation is not demonstrated. Aortic Valve: The aortic valve was not well visualized. Aortic valve regurgitation is not visualized. No aortic stenosis is present. Pulmonic Valve: The pulmonic valve was not well visualized. Pulmonic valve regurgitation is not visualized. Aorta: The aortic root is normal in size and structure. Venous: The inferior vena cava is normal in size with greater than 50% respiratory variability, suggesting right atrial pressure of 3 mmHg. IAS/Shunts: The interatrial septum was not well visualized.  LEFT VENTRICLE PLAX 2D LVIDd:  4.00 cm  Diastology LVIDs:         2.60 cm  LV e' medial:    11.40 cm/s LV PW:         0.80 cm  LV E/e' medial:  8.5 LV IVS:        0.80 cm  LV e' lateral:   15.70 cm/s LVOT diam:     1.70 cm  LV E/e' lateral: 6.2 LV SV:         53 LV SV Index:   33       2D Longitudinal Strain LVOT Area:     2.27 cm 2D Strain GLS Avg:     -19.3 %  RIGHT VENTRICLE RV S prime:     11.40 cm/s TAPSE (M-mode): 1.8 cm LEFT ATRIUM             Index       RIGHT ATRIUM           Index LA diam:        2.50 cm 1.55 cm/m  RA Area:     11.80 cm LA Vol (A2C):   19.8 ml 12.29 ml/m RA Volume:   22.30 ml  13.84 ml/m LA Vol (A4C):   16.5 ml 10.24 ml/m LA Biplane Vol:  18.2 ml 11.30 ml/m  AORTIC VALVE LVOT Vmax:   128.00 cm/s LVOT Vmean:  93.100 cm/s LVOT VTI:    0.235 m  AORTA Ao Root diam: 2.60 cm MITRAL VALVE MV Area (PHT): 2.99 cm    SHUNTS MV Decel Time: 254 msec    Systemic VTI:  0.24 m MV E velocity: 97.30 cm/s  Systemic Diam: 1.70 cm MV A velocity: 56.10 cm/s MV E/A ratio:  1.73 Oswaldo Milian MD Electronically signed by Oswaldo Milian MD Signature Date/Time: 03/19/2020/2:41:34 PM    Final      ELIGIBLE FOR AVAILABLE RESEARCH PROTOCOL: AET  ASSESSMENT: 44 y.o. [place] woman status post right breast upper outer quadrant biopsy 02/04/2020 for a clinical T2 N0, stage IB invasive ductal carcinoma, grade 2, triple positive, with an MIB-1 of 20%  (1) status post right mastectomy and sentinel lymph node sampling 03/07/2020 for a pT2 pN1, stage IB invasive ductal carcinoma, grade 2, with negative margins.  (a) a total of 4 lymph nodes were removed, one positive  (2) adjuvant chemo immunotherapy will consist of carboplatin, docetaxel, trastuzumab and pertuzumab every 21 days x 6 starting 03/29/2020  (3) trastuzumab/ pertuzumab to continue to complete a year  (a) echo 03/18/2020 shows an ejection fraction in the 60-65% range  (4) adjuvant radiation to follow  (5) genetics testing 03/02/2020 through the [facility] Common Hereditary Cancers Panel. . The Common Hereditary Cancers Panel found no deleterious mutations in APC, ATM, AXIN2, BARD1, BMPR1A, BRCA1, BRCA2, BRIP1, CDH1, CDK4, CDKN2A (p14ARF), CDKN2A (p16INK4a), CHEK2, CTNNA1, DICER1, EPCAM (Deletion/duplication testing only), GREM1 (promoter region deletion/duplication testing only), KIT, MEN1, MLH1, MSH2, MSH3, MSH6, MUTYH, NBN, NF1, NHTL1, PALB2, PDGFRA, PMS2, POLD1, POLE, PTEN, RAD50, RAD51C, RAD51D, RNF43, SDHB, SDHC, SDHD, SMAD4, SMARCA4. STK11, TP53, TSC1, TSC2, and VHL.  The following genes were evaluated for sequence changes only: SDHA and HOXB13 c.251G>A variant only.   (a)  a Variant of  uncertain significance (VUS) in DICER1 at c.3380T>G (p.Ile1127Ser) was noted  (6) tamoxifen started 02/12/2020 in anticipation of possible surgical delays, held during chemotherapy   PLAN: Iwalani tolerated her first cycle of chemotherapy remarkably well.  She was constipated from the premeds.  So far she has had no  diarrhea.  We discussed her starting MiraLAX and a stool softener day one of her next cycle which will start 04/20/2020.  When I see her again day 1 cycle 2 I will instruct her to use an NSAID S for the bony pain she is likely to have again after the Udenyca.  Unfortunately I do not yet have today's CBC results so I do not know if she will need antibiotics.  If her counts are low I will call her in ciprofloxacin  Total encounter time 25 minutes  Sarajane Jews C. , MD 04/05/2020 8:26 AM Medical Oncology and Hematology [facility] [place], [place] [address] Tel. [phone]    Fax. [phone]  Addendum: Total white cell count is greater than 20,000, neutrophils 11.2.  She will not need intertreatment antibiotics.   This document serves as a record of services personally performed by Lurline Del, MD. It was created on his behalf by Wilburn Mylar, a trained medical scribe. The creation of this record is based on the scribe's personal observations and the provider's statements to them.   I, Lurline Del MD, have reviewed the above documentation for accuracy and completeness, and I agree with the above.   *Total Encounter Time as defined by the Centers for Medicare and Medicaid Services includes, in addition to the face-to-face time of a patient visit (documented in the note above) non-face-to-face time: obtaining and reviewing outside history, ordering and reviewing medications, tests or procedures, care coordination (communications with other health care professionals or caregivers) and documentation in the medical record.

## 2020-04-18 ENCOUNTER — Ambulatory Visit: Payer: No Typology Code available for payment source

## 2020-04-19 NOTE — Progress Notes (Signed)
[place]  [phone] [phone]     ID: Jill [name] DOB: 03/25/1976  MR#: [id number]  ONG#:[id number]  Patient Care Team: Gildardo Pounds, NP as PCP - General (Nurse Practitioner) Lariza Cothron, Virgie Dad, MD as Consulting Physician (Oncology) Jovita Kussmaul, MD as Consulting Physician (General Surgery) Dillingham, Loel Lofty, DO as Attending Physician (Plastic Surgery) Mauro Kaufmann, RN as Oncology Nurse Navigator Rockwell Germany, RN as Oncology Nurse Navigator Chauncey Cruel, MD OTHER MD:  CHIEF COMPLAINT: triple positive breast cancer (s/p right mastectomy)  CURRENT TREATMENT: Adjuvant chemotherapy   INTERVAL HISTORY: [name] returns today for follow up and treatment of her triple positive breast cancer, accompanied by her interpreter (who incidentally was born in [place]).   Katelynne began adjuvant chemotherapy, consisting of carboplatin, docetaxel, trastuzumab and pertuzumab every 21 days x6, on 03/29/2020. She received udenyca day 3. Today is day 1 cycle 2.  Her most recent echocardiogram on 03/18/2020 showed an ejection fraction of 60-65%.   REVIEW OF SYSTEMS: Flois tolerated her chemotherapy remarkably well.  She had no diarrhea from the [place].  In fact she had constipation and she has obtained some MiraLAX which she will start later today to prevent that problem.  She did take her Decadron yesterday but it causes her significant stomach upset and she wishes to stop it.  She has received both doses of the Pfizer vaccine.  She has been doing some cooking, some housework, and taking care of her 32-year-old grandson.  She has no symptoms of peripheral neuropathy.  Detailed review of systems today was otherwise stable.   HISTORY OF CURRENT ILLNESS: From the original intake note:  "Ivan" herself palpated a right breast abnormality sometime in 2019.,  She did not pay much mind.  This was noted again during her  pregnancy and she underwent bilateral diagnostic mammography with tomography and right breast ultrasonography at [facility] on 01/21/2020 showing: breast density category C; 4.1 cm irregular mass with associated group of pleomorphic calcifications measuring approximately 4.9 cm, located at site of palpable concern in upper-outer right breast at 9:30; no right axillary lymphadenopathy.  Accordingly on 02/04/2020 she proceeded to biopsy of the right breast area in question. The pathology from this procedure ([id number]) showed: invasive and in situ mammary carcinoma, e-cadherin positive, grade 2. Prognostic indicators significant for: estrogen receptor, 100% positive and progesterone receptor, 100% positive, both with strong staining intensity. Proliferation marker Ki67 at 20%. HER2 equivocal by immunohistochemistry (2+), but positive by fluorescent in situ hybridization with a signals ratio 5.39 and number per cell 11.05.  The patient's subsequent history is as detailed below.   PAST MEDICAL HISTORY: Past Medical History:  Diagnosis Date  . Infected cyst of Bartholin's gland duct   . Medical history non-contributory     PAST SURGICAL HISTORY: Past Surgical History:  Procedure Laterality Date  . IR IMAGING GUIDED PORT INSERTION  03/02/2020  . MASTECTOMY W/ SENTINEL NODE BIOPSY Right 03/07/2020   Procedure: RIGHT MASTECTOMY WITH SENTINEL LYMPH NODE BIOPSY;  Surgeon: Jovita Kussmaul, MD;  Location: [facility];  Service: General;  Laterality: Right;  . NO PAST SURGERIES    . WISDOM TOOTH EXTRACTION      FAMILY HISTORY: Family History  Problem Relation Age of Onset  . Diabetes Maternal Grandmother   . Hypertension Mother   The patient's father is 60 and her mother 70 as of August 2021.  The patient is 3 brothers and  2 sisters.  There is no history of cancer in the family to her knowledge   GYNECOLOGIC HISTORY:  No LMP recorded. (Menstrual status: IUD). Menarche: 44 years  old Age at first live birth: 44 years old [place] P 4 LMP irregular Contraceptive: Mirena IUD in place HRT n/a  Hysterectomy? no BSO? 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  Her significant other [name] works Occupational hygienist.  They are both from [place].  The patient's children are ages 37, 100, 75, and 20 months as of August 2021.  The older 3 children are from a different father and their last name is [name].  (The patient was not married to Mr. [name] so there is no legal issue regarding access to medical records, etc.). The 64-monthold is a child of Kanoelani and MCairo  The patient attends a local [place]ES: Not in place   HEALTH MAINTENANCE: Social History   Tobacco Use  . Smoking status: Never Smoker  . Smokeless tobacco: Never Used  Vaping Use  . Vaping Use: Never used  Substance Use Topics  . Alcohol use: Never  . Drug use: Never     Colonoscopy: n/a (age)  PAP: 10/2018, negative  Bone density: n/a (age)   No Known Allergies  Current Outpatient Medications  Medication Sig Dispense Refill  . acetaminophen (TYLENOL) 500 MG tablet Take 500 mg by mouth every 6 (six) hours as needed for moderate pain or headache.    . dexamethasone (DECADRON) 4 MG tablet Take 2 tablets (8 mg total) by mouth 2 (two) times daily. Take decadron  Twice daily the day before taxotere then daily x 3 days after carboplatin  chemo 30 tablet 1  . HYDROcodone-acetaminophen (NORCO/VICODIN) 5-325 MG tablet Take 1-2 tablets by mouth every 6 (six) hours as needed for moderate pain or severe pain. 15 tablet 0  . ibuprofen (ADVIL) 200 MG tablet Take 400 mg by mouth every 6 (six) hours as needed for headache or moderate pain.    .Marland Kitchenlidocaine-prilocaine (EMLA) cream Apply to affected area once (Patient not taking: Reported on 03/25/2020) 30 g 3  .  loratadine (CLARITIN) 10 MG tablet Take 1 tablet (10 mg total) by mouth daily. 60 tablet 6  . LORazepam (ATIVAN) 0.5 MG tablet Take 1 tablet (0.5 mg total) by mouth at bedtime as needed (Nausea or vomiting). (Patient not taking: Reported on 03/25/2020) 20 tablet 0  . methocarbamol (ROBAXIN) 750 MG tablet Take 1 tablet (750 mg total) by mouth 4 (four) times daily as needed (use for muscle cramps/pain). 30 tablet 2  . prochlorperazine (COMPAZINE) 10 MG tablet Take 1 tablet (10 mg total) by mouth every 6 (six) hours as needed (Nausea or vomiting). 30 tablet 1   No current facility-administered medications for this visit.    OBJECTIVE: Spanish speaker in no acute distress  Vitals:   04/20/20 0838  BP: 105/70  Pulse: 75  Resp: 17  Temp: 99.3 F (37.4 C)  SpO2: 99%     Body mass index is 32.35 kg/m.   Wt Readings from Last 3 Encounters:  04/20/20 154 lb 12.8 oz (70.2 kg)  04/05/20 150 lb 3.2 oz (68.1 kg)  03/28/20 150 lb 12.8 oz (68.4 kg)      ECOG FS:1 - Symptomatic but completely ambulatory  Sclerae unicteric, EOMs intact Wearing a mask No  cervical or supraclavicular adenopathy Lungs no rales or rhonchi Heart regular rate and rhythm Abd soft, nontender, positive bowel sounds MSK no focal spinal tenderness, no upper extremity lymphedema Neuro: nonfocal, well oriented, appropriate affect Breasts: Status post right mastectomy with no evidence of disease recurrence.  Both axillae are benign.   LAB RESULTS:  CMP     Component Value Date/Time   NA 133 (L) 04/05/2020 0810   NA 138 12/15/2019 1543   K 3.9 04/05/2020 0810   CL 99 04/05/2020 0810   CO2 27 04/05/2020 0810   GLUCOSE 93 04/05/2020 0810   BUN 11 04/05/2020 0810   BUN 10 12/15/2019 1543   CREATININE 0.67 04/05/2020 0810   CREATININE 0.77 02/22/2020 1353   CALCIUM 8.5 (L) 04/05/2020 0810   PROT 6.2 (L) 04/05/2020 0810   PROT 7.3 12/15/2019 1543   ALBUMIN 3.5 04/05/2020 0810   ALBUMIN 4.5 12/15/2019 1543   AST 43  (H) 04/05/2020 0810   AST 13 (L) 02/22/2020 1353   ALT 60 (H) 04/05/2020 0810   ALT 8 02/22/2020 1353   ALKPHOS 76 04/05/2020 0810   BILITOT 0.4 04/05/2020 0810   BILITOT 0.3 02/22/2020 1353   GFRNONAA >60 04/05/2020 0810   GFRNONAA >60 02/22/2020 1353   GFRAA >60 03/28/2020 0813   GFRAA >60 02/22/2020 1353    No results found for: TOTALPROTELP, ALBUMINELP, A1GS, A2GS, BETS, BETA2SER, GAMS, MSPIKE, SPEI  Lab Results  Component Value Date   WBC 4.4 04/20/2020   NEUTROABS 2.6 04/20/2020   HGB 11.7 (L) 04/20/2020   HCT 34.8 (L) 04/20/2020   MCV 88.8 04/20/2020   PLT 466 (H) 04/20/2020    No results found for: LABCA2  No components found for: VFIEPP295  No results for input(s): INR in the last 168 hours.  No results found for: LABCA2  No results found for: JOA416  No results found for: SAY301  No results found for: SWF093  No results found for: CA2729  No components found for: HGQUANT  No results found for: CEA1 / No results found for: CEA1   No results found for: AFPTUMOR  No results found for: CHROMOGRNA  No results found for: KPAFRELGTCHN, LAMBDASER, KAPLAMBRATIO (kappa/lambda light chains)  Lab Results  Component Value Date   HGBA 97.0 10/10/2018   (Hemoglobinopathy evaluation)   No results found for: LDH  No results found for: IRON, TIBC, IRONPCTSAT (Iron and TIBC)  No results found for: FERRITIN  Urinalysis    Component Value Date/Time   COLORURINE YELLOW 12/31/2017 2039   APPEARANCEUR CLOUDY (A) 12/31/2017 2039   LABSPEC 1.015 12/31/2017 2039   PHURINE 5.0 12/31/2017 2039   GLUCOSEU NEGATIVE 12/31/2017 2039   HGBUR MODERATE (A) 12/31/2017 2039   BILIRUBINUR NEGATIVE 12/31/2017 2039   [place] NEGATIVE 12/31/2017 2039   PROTEINUR NEGATIVE 12/31/2017 2039   NITRITE NEGATIVE 12/31/2017 2039   LEUKOCYTESUR LARGE (A) 12/31/2017 2039     STUDIES: No results found.   ELIGIBLE FOR AVAILABLE RESEARCH PROTOCOL: AET  ASSESSMENT: 44 y.o.  [place] woman status post right breast upper outer quadrant biopsy 02/04/2020 for a clinical T2 N0, stage IB invasive ductal carcinoma, grade 2, triple positive, with an MIB-1 of 20%  (1) status post right mastectomy and sentinel lymph node sampling 03/07/2020 for a pT2 pN1, stage IB invasive ductal carcinoma, grade 2, with negative margins.  (a) a total of 4 lymph nodes were removed, one positive  (2) adjuvant chemo immunotherapy will consist of carboplatin, docetaxel, trastuzumab and pertuzumab every  21 days x 6 starting 03/29/2020  (3) trastuzumab/ pertuzumab to continue to complete a year  (a) echo 03/18/2020 shows an ejection fraction in the 60-65% range  (4) adjuvant radiation to follow  (5) genetics testing 03/02/2020 through the [facility] Common Hereditary Cancers Panel. . The Common Hereditary Cancers Panel found no deleterious mutations in APC, ATM, AXIN2, BARD1, BMPR1A, BRCA1, BRCA2, BRIP1, CDH1, CDK4, CDKN2A (p14ARF), CDKN2A (p16INK4a), CHEK2, CTNNA1, DICER1, EPCAM (Deletion/duplication testing only), GREM1 (promoter region deletion/duplication testing only), KIT, MEN1, MLH1, MSH2, MSH3, MSH6, MUTYH, NBN, NF1, NHTL1, PALB2, PDGFRA, PMS2, POLD1, POLE, PTEN, RAD50, RAD51C, RAD51D, RNF43, SDHB, SDHC, SDHD, SMAD4, SMARCA4. STK11, TP53, TSC1, TSC2, and VHL.  The following genes were evaluated for sequence changes only: SDHA and HOXB13 c.251G>A variant only.   (a)  a Variant of uncertain significance (VUS) in DICER1 at c.3380T>G (p.Ile1127Ser) was noted  (6) tamoxifen started 02/12/2020 in anticipation of possible surgical delays, held during chemotherapy   PLAN: Corrina will proceed to her second of 6 planned cycles of chemotherapy with anti-HER-2 treatment today.  We are adding omeprazole 2 days 1 through 5 and she also may take Tums as needed.  We are also dropping her Decadron to 4 mg twice daily instead of 8 mg twice daily.  If she has no nausea or vomiting with this lower dose of  Decadron, we will omitted next time since it is causing her some stomach problems.  She is taking the Compazine as prescribed and that is working well for her, with no side effects.  So far she has had no peripheral neuropathy symptoms and no diarrhea.  She has received both doses of the Pfizer vaccine and may receive the booster at any time, the best time being about day 5 or 6 after a chemotherapy treatment  She will see is with each treatment.  I have corrected her schedule.  She will see me specifically with the January chemotherapy dose . Total encounter time 25 minutes.Sarajane Jews C. Avraj Lindroth, MD 04/20/2020 8:45 AM Medical Oncology and Hematology [facility] [place], [place] [address] Tel. [phone]    Fax. [phone]   This document serves as a record of services personally performed by Lurline Del, MD. It was created on his behalf by Wilburn Mylar, a trained medical scribe. The creation of this record is based on the scribe's personal observations and the provider's statements to them.   I, Lurline Del MD, have reviewed the above documentation for accuracy and completeness, and I agree with the above.   *Total Encounter Time as defined by the Centers for Medicare and Medicaid Services includes, in addition to the face-to-face time of a patient visit (documented in the note above) non-face-to-face time: obtaining and reviewing outside history, ordering and reviewing medications, tests or procedures, care coordination (communications with other health care professionals or caregivers) and documentation in the medical record.

## 2020-04-20 ENCOUNTER — Other Ambulatory Visit: Payer: Self-pay

## 2020-04-20 ENCOUNTER — Encounter: Payer: Self-pay | Admitting: *Deleted

## 2020-04-20 ENCOUNTER — Inpatient Hospital Stay: Payer: No Typology Code available for payment source

## 2020-04-20 ENCOUNTER — Other Ambulatory Visit (HOSPITAL_COMMUNITY): Payer: Self-pay | Admitting: Oncology

## 2020-04-20 ENCOUNTER — Inpatient Hospital Stay (HOSPITAL_BASED_OUTPATIENT_CLINIC_OR_DEPARTMENT_OTHER): Payer: No Typology Code available for payment source | Admitting: Oncology

## 2020-04-20 VITALS — BP 109/61 | HR 80 | Temp 98.6°F | Resp 20

## 2020-04-20 VITALS — BP 105/70 | HR 75 | Temp 99.3°F | Resp 17 | Ht <= 58 in | Wt 154.8 lb

## 2020-04-20 DIAGNOSIS — Z170 Estrogen receptor positive status [ER+]: Secondary | ICD-10-CM

## 2020-04-20 DIAGNOSIS — C50411 Malignant neoplasm of upper-outer quadrant of right female breast: Secondary | ICD-10-CM

## 2020-04-20 LAB — COMPREHENSIVE METABOLIC PANEL WITH GFR
ALT: 24 U/L (ref 0–44)
AST: 26 U/L (ref 15–41)
Albumin: 3.6 g/dL (ref 3.5–5.0)
Alkaline Phosphatase: 67 U/L (ref 38–126)
Anion gap: 6 (ref 5–15)
BUN: 14 mg/dL (ref 6–20)
CO2: 26 mmol/L (ref 22–32)
Calcium: 9 mg/dL (ref 8.9–10.3)
Chloride: 109 mmol/L (ref 98–111)
Creatinine, Ser: 0.63 mg/dL (ref 0.44–1.00)
GFR, Estimated: >60 mL/min (ref >60)
Glucose, Bld: 90 mg/dL (ref 70–99)
Potassium: 3.9 mmol/L (ref 3.5–5.1)
Sodium: 141 mmol/L (ref 135–145)
Total Bilirubin: 0.4 mg/dL (ref 0.3–1.2)
Total Protein: 6.4 g/dL — ABNORMAL LOW (ref 6.5–8.1)

## 2020-04-20 LAB — CBC WITH DIFFERENTIAL/PLATELET
Abs Immature Granulocytes: 0.01 10*3/uL (ref 0.00–0.07)
Basophils Absolute: 0 10*3/uL (ref 0.0–0.1)
Basophils Relative: 1 %
Eosinophils Absolute: 0.1 10*3/uL (ref 0.0–0.5)
Eosinophils Relative: 1 %
HCT: 34.8 % — ABNORMAL LOW (ref 36.0–46.0)
Hemoglobin: 11.7 g/dL — ABNORMAL LOW (ref 12.0–15.0)
Immature Granulocytes: 0 %
Lymphocytes Relative: 33 %
Lymphs Abs: 1.5 10*3/uL (ref 0.7–4.0)
MCH: 29.8 pg (ref 26.0–34.0)
MCHC: 33.6 g/dL (ref 30.0–36.0)
MCV: 88.8 fL (ref 80.0–100.0)
Monocytes Absolute: 0.2 10*3/uL (ref 0.1–1.0)
Monocytes Relative: 5 %
Neutro Abs: 2.6 10*3/uL (ref 1.7–7.7)
Neutrophils Relative %: 60 %
Platelets: 466 10*3/uL — ABNORMAL HIGH (ref 150–400)
RBC: 3.92 MIL/uL (ref 3.87–5.11)
RDW: 13.9 % (ref 11.5–15.5)
WBC: 4.4 10*3/uL (ref 4.0–10.5)
nRBC: 0 % (ref 0.0–0.2)

## 2020-04-20 MED ORDER — SODIUM CHLORIDE 0.9 % IV SOLN
Freq: Once | INTRAVENOUS | Status: AC
  Filled 2020-04-20: qty 250

## 2020-04-20 MED ORDER — PALONOSETRON HCL INJECTION 0.25 MG/5ML
0.2500 mg | Freq: Once | INTRAVENOUS | Status: AC
  Administered 2020-04-20: 0.25 mg via INTRAVENOUS

## 2020-04-20 MED ORDER — SODIUM CHLORIDE 0.9% FLUSH
10.0000 mL | INTRAVENOUS | Status: DC | PRN
  Administered 2020-04-20: 10 mL
  Filled 2020-04-20: qty 10

## 2020-04-20 MED ORDER — ACETAMINOPHEN 325 MG PO TABS
ORAL_TABLET | ORAL | Status: AC
  Filled 2020-04-20: qty 2

## 2020-04-20 MED ORDER — SODIUM CHLORIDE 0.9 % IV SOLN
10.0000 mg | Freq: Once | INTRAVENOUS | Status: AC
  Administered 2020-04-20: 10 mg via INTRAVENOUS
  Filled 2020-04-20: qty 10

## 2020-04-20 MED ORDER — SODIUM CHLORIDE 0.9 % IV SOLN
420.0000 mg | Freq: Once | INTRAVENOUS | Status: AC
  Administered 2020-04-20: 420 mg via INTRAVENOUS
  Filled 2020-04-20: qty 14

## 2020-04-20 MED ORDER — TRASTUZUMAB-ANNS CHEMO 150 MG IV SOLR
6.0000 mg/kg | Freq: Once | INTRAVENOUS | Status: AC
  Administered 2020-04-20: 420 mg via INTRAVENOUS
  Filled 2020-04-20: qty 20

## 2020-04-20 MED ORDER — PALONOSETRON HCL INJECTION 0.25 MG/5ML
INTRAVENOUS | Status: AC
  Filled 2020-04-20: qty 5

## 2020-04-20 MED ORDER — SODIUM CHLORIDE 0.9 % IV SOLN
75.0000 mg/m2 | Freq: Once | INTRAVENOUS | Status: AC
  Administered 2020-04-20: 130 mg via INTRAVENOUS
  Filled 2020-04-20: qty 13

## 2020-04-20 MED ORDER — HEPARIN SOD (PORK) LOCK FLUSH 100 UNIT/ML IV SOLN
500.0000 [IU] | Freq: Once | INTRAVENOUS | Status: AC | PRN
  Administered 2020-04-20: 500 [IU]
  Filled 2020-04-20: qty 5

## 2020-04-20 MED ORDER — CALCIUM CARBONATE ANTACID 500 MG PO CHEW: 1.0000 | CHEWABLE_TABLET | Freq: Three times a day (TID) | ORAL | 0 refills | Status: DC | PRN

## 2020-04-20 MED ORDER — SODIUM CHLORIDE 0.9 % IV SOLN
150.0000 mg | Freq: Once | INTRAVENOUS | Status: AC
  Administered 2020-04-20: 150 mg via INTRAVENOUS
  Filled 2020-04-20: qty 150

## 2020-04-20 MED ORDER — DIPHENHYDRAMINE HCL 25 MG PO CAPS
ORAL_CAPSULE | ORAL | Status: AC
  Filled 2020-04-20: qty 1

## 2020-04-20 MED ORDER — ACETAMINOPHEN 325 MG PO TABS
650.0000 mg | ORAL_TABLET | Freq: Once | ORAL | Status: AC
  Administered 2020-04-20: 650 mg via ORAL

## 2020-04-20 MED ORDER — DIPHENHYDRAMINE HCL 25 MG PO CAPS
25.0000 mg | ORAL_CAPSULE | Freq: Once | ORAL | Status: AC
  Administered 2020-04-20: 25 mg via ORAL

## 2020-04-20 MED ORDER — SODIUM CHLORIDE 0.9 % IV SOLN
623.0000 mg | Freq: Once | INTRAVENOUS | Status: AC
  Administered 2020-04-20: 620 mg via INTRAVENOUS
  Filled 2020-04-20: qty 62

## 2020-04-20 MED ORDER — OMEPRAZOLE 40 MG PO CPDR: 40.0000 mg | DELAYED_RELEASE_CAPSULE | Freq: Every day | ORAL | 0 refills | Status: DC

## 2020-04-20 MED ORDER — SODIUM CHLORIDE 0.9% FLUSH
3.0000 mL | INTRAVENOUS | Status: DC | PRN
  Administered 2020-04-20: 3 mL
  Filled 2020-04-20: qty 10

## 2020-04-20 MED FILL — OMEPRAZOLE 40 MG CPDR: 40 | 30 days supply | Qty: 30 | Fill #0

## 2020-04-20 NOTE — Patient Instructions (Signed)
[facility] Discharge Instructions for Patients Receiving Chemotherapy  Today you received the following chemotherapy agents: Trastuzumab, Pertuzumab, Taxotere, and Carboplatin.  To help prevent nausea and vomiting after your treatment, we encourage you to take your nausea medication as directed by your MD.   If you develop nausea and vomiting that is not controlled by your nausea medication, call the clinic.   BELOW ARE SYMPTOMS THAT SHOULD BE REPORTED IMMEDIATELY:  *FEVER GREATER THAN 100.5 F  *CHILLS WITH OR WITHOUT FEVER  NAUSEA AND VOMITING THAT IS NOT CONTROLLED WITH YOUR NAUSEA MEDICATION  *UNUSUAL SHORTNESS OF BREATH  *UNUSUAL BRUISING OR BLEEDING  TENDERNESS IN MOUTH AND THROAT WITH OR WITHOUT PRESENCE OF ULCERS  *URINARY PROBLEMS  *BOWEL PROBLEMS  UNUSUAL RASH Items with * indicate a potential emergency and should be followed up as soon as possible.  Feel free to call the clinic should you have any questions or concerns. The clinic phone number is [phone].  Please show the [facility] at check-in to the Emergency Department and triage nurse.

## 2020-04-20 NOTE — Patient Instructions (Signed)

## 2020-04-21 ENCOUNTER — Ambulatory Visit: Payer: No Typology Code available for payment source

## 2020-04-21 MED FILL — CALCIUM ANTACID 500 MG CHW: 500 | 50 days supply | Qty: 150 | Fill #0

## 2020-04-22 ENCOUNTER — Inpatient Hospital Stay: Payer: No Typology Code available for payment source

## 2020-04-22 ENCOUNTER — Other Ambulatory Visit: Payer: Self-pay

## 2020-04-22 VITALS — BP 119/72 | HR 86 | Temp 98.7°F | Resp 18

## 2020-04-22 DIAGNOSIS — Z170 Estrogen receptor positive status [ER+]: Secondary | ICD-10-CM

## 2020-04-22 DIAGNOSIS — C50411 Malignant neoplasm of upper-outer quadrant of right female breast: Secondary | ICD-10-CM

## 2020-04-22 MED ORDER — PEGFILGRASTIM-CBQV 6 MG/0.6ML ~~LOC~~ SOSY
PREFILLED_SYRINGE | SUBCUTANEOUS | Status: AC
  Filled 2020-04-22: qty 0.6

## 2020-04-22 MED ORDER — PEGFILGRASTIM-CBQV 6 MG/0.6ML ~~LOC~~ SOSY
6.0000 mg | PREFILLED_SYRINGE | Freq: Once | SUBCUTANEOUS | Status: AC
  Administered 2020-04-22: 6 mg via SUBCUTANEOUS

## 2020-04-22 NOTE — Patient Instructions (Signed)
Pegfilgrastim injection Qu es este medicamento? El PEGFILGRASTIM es un factor estimulante de colonias de granulocitos de accin prolongada que estimula el crecimiento de los neutrfilos, un tipo de glbulo blanco importante en la lucha del cuerpo contra la infeccin. Se utiliza para reducir la incidencia de la fiebre y la infeccin en pacientes con ciertos tipos de cncer que reciben quimioterapia que afecta la mdula sea, y para aumentar la supervivencia despus de estar expuesto a altas dosis de radiacin. Este medicamento puede ser utilizado para otros usos; si tiene alguna pregunta consulte con su proveedor de atencin mdica o con su farmacutico. MARCAS COMUNES: Fulphila, Neulasta, UDENYCA, Ziextenzo Qu le debo informar a mi profesional de la salud antes de tomar este medicamento? Necesita saber si usted presenta alguno de los siguientes problemas o situaciones:  enfermedad renal  alergia al ltex  si actualmente recibe radioterapia  anemia drepanoctica  reacciones cutneas a Therapist, music acrlicos (On-Body Injector solamente, su nombre en ingls)  una reaccin alrgica o inusual al pegfilgrastim, al filgrastim, a otros medicamentos, alimentos, colorantes o conservantes  si est embarazada o buscando quedar embarazada  si est amamantando a un beb Cmo debo utilizar este medicamento? Este medicamento se administra mediante una inyeccin por va subcutnea. Si recibe [employer] en su casa, le ensearn cmo preparar y Architectural technologist la jeringa prellenada o cmo usar el inyector en el cuerpo (su nombre en ingls, On-body Injector). Consulte las instrucciones de uso para el paciente para obtener instrucciones detalladas. selo exactamente como se le indique. Informe de inmediato a su proveedor de atencin de la salud si sospecha que su inyector en el cuerpo (en ingls "On-body Injector") puede haber funcionado incorrectamente o si sospecha que el uso de su inyector en el cuerpo  hizo que usted recibiera una dosis parcial o no recibiera una dosis. Es importante que deseche las agujas y las jeringas usadas en un recipiente resistente a los pinchazos. No los deseche en la basura. Si no tiene un recipiente resistente a los pinchazos, consulte a Midwife o proveedor de atencin de la salud para obtenerlo. Hable con su pediatra para informarse acerca del uso de este medicamento en nios. Aunque este medicamento se puede Advertising account executive, existen precauciones que deben tomarse. Sobredosis: Pngase en contacto inmediatamente con un centro toxicolgico o una sala de urgencia si usted cree que haya tomado demasiado medicamento. ATENCIN: [employer] es solo para usted. No comparta este medicamento con nadie. Qu sucede si me olvido de una dosis? Es importante no olvidar ninguna dosis. Comunquese con su mdico o profesional de la salud si se olvida una dosis. Si se olvida una dosis debido a un fallo del Journalist, newspaper cuerpo (su nombre en ingls, On-body Injector). o fugas, una nueva dosis debe ser administrada tan pronto como sea posible usando una sola [place] prellenada para uso manual. Qu puede interactuar con este medicamento? No se han estudiado las interacciones. Puede ser que esta lista no menciona todas las posibles interacciones. Informe a su profesional de [employer]	[place] de [employer] productos a base de hierbas, medicamentos de [place] o suplementos nutritivos que est tomando. Si usted fuma, consume bebidas alcohlicas o si utiliza drogas ilegales, indqueselo tambin a su profesional de [employer]	[place]. Algunas sustancias pueden interactuar con su medicamento. A qu debo estar atento al usar [employer]? Usted podr Psychologist, prison and probation services de sangre mientras est usando este medicamento. Si va a someterse a un IRM (MRI), tomografa computarizada u otro procedimiento, informe a su  mdico que est usando este medicamento (su nombre en ingls, On-body  Injector solamente). Qu efectos secundarios puedo tener al [employer] este medicamento? Efectos secundarios que debe informar a su mdico o a Barrister's clerk de la salud tan pronto como sea posible: Chief of Staff, como erupcin cutnea, comezn/picazn o urticaria, e hinchazn de la cara, los labios o la Holiday representative de espalda mareos fiebre Social research officer, government, enrojecimiento o Actor de la inyeccin puntos rojos en la piel orina roja o Forensic psychologist oscuro falta de aire o problemas respiratorios dolor de Paramedic o del costado, o Social research officer, government en el hombro hinchazn cansancio dificultad para Garment/textile technologist o cambios en el volumen de orina Efectos secundarios que generalmente no requieren atencin mdica (infrmelos a su mdico o a Barrister's clerk de la salud si persisten o si son molestos): dolor de Surveyor, quantity Puede ser que esta lista no menciona todos los posibles efectos secundarios. Comunquese a su mdico por asesoramiento mdico [employer]. Usted puede informar los efectos secundarios a la FDA por telfono al 1-800-FDA-1088. Dnde debo guardar mi medicina? Mantenga fuera del alcance de los nios. Si est [employer] en su casa, le indicarn cmo guardarlo. Deseche todo el medicamento que no haya utilizado despus de la fecha de vencimiento indicada en la etiqueta. ATENCIN: Este folleto es un resumen. Puede ser que no cubra toda la posible informacin. Si usted tiene preguntas acerca de esta medicina, consulte con su mdico, su farmacutico o su profesional de Technical sales engineer.  2020 Elsevier/Gold Standard (2017-11-25 00:00:00)

## 2020-05-10 NOTE — Progress Notes (Signed)
[facility]  [phone] [phone]) [phone]     ID: Jill [name] DOB: 19-May-1976  MR#: [id number]  FXJ#:[id number]  Patient Care Team: Gildardo Pounds, NP as PCP - General (Nurse Practitioner) , Virgie Dad, MD as Consulting Physician (Oncology) Jovita Kussmaul, MD as Consulting Physician (General Surgery) Dillingham, Loel Lofty, DO as Attending Physician (Plastic Surgery) Mauro Kaufmann, RN as Oncology Nurse Navigator Rockwell Germany, RN as Oncology Nurse Navigator Chauncey Cruel, MD OTHER MD:  CHIEF COMPLAINT: triple positive breast cancer (s/p right mastectomy)  CURRENT TREATMENT: Adjuvant chemotherapy   INTERVAL HISTORY: Jill [name] returns today for follow up and treatment of her triple positive breast cancer.  [name] began adjuvant chemotherapy, consisting of carboplatin, docetaxel, trastuzumab and pertuzumab every 21 days x6, on 03/29/2020. She received udenyca day 3. Today is day 1 cycle 3.  Her most recent echocardiogram on 03/18/2020 showed an ejection fraction of 60-65%.   REVIEW OF SYSTEMS: Labella did generally well with her second cycle.  She had a strange symptom where she had pain in her throat and right shoulder and taking glass of water relieve that problem.  This happened only 1 time and did not recur.  She did not have significant nausea and certainly no vomiting.  She had less gastritis this time.  She did have more loose bowel movements and in fact this morning she felt like she has been purged.  In addition she has had a little bit of shooting pain in the right temple area.  She has some scalp itching.  A detailed review of systems today was otherwise stable   COVID 19 VACCINATION STATUS: fully vaccinated Therapist, music)   HISTORY OF CURRENT ILLNESS: From the original intake note:  "Jill [name]" herself palpated a right breast abnormality sometime in 2019.,  She did not pay much mind.  This was noted again during her  pregnancy and she underwent bilateral diagnostic mammography with tomography and right breast ultrasonography at [facility] on 01/21/2020 showing: breast density category C; 4.1 cm irregular mass with associated group of pleomorphic calcifications measuring approximately 4.9 cm, located at site of palpable concern in upper-outer right breast at 9:30; no right axillary lymphadenopathy.  Accordingly on 02/04/2020 she proceeded to biopsy of the right breast area in question. The pathology from this procedure ([id number]) showed: invasive and in situ mammary carcinoma, e-cadherin positive, grade 2. Prognostic indicators significant for: estrogen receptor, 100% positive and progesterone receptor, 100% positive, both with strong staining intensity. Proliferation marker Ki67 at 20%. HER2 equivocal by immunohistochemistry (2+), but positive by fluorescent in situ hybridization with a signals ratio 5.39 and number per cell 11.05.  The patient's subsequent history is as detailed below.   PAST MEDICAL HISTORY: Past Medical History:  Diagnosis Date  . Infected cyst of Bartholin's gland duct   . Medical history non-contributory     PAST SURGICAL HISTORY: Past Surgical History:  Procedure Laterality Date  . IR IMAGING GUIDED PORT INSERTION  03/02/2020  . MASTECTOMY W/ SENTINEL NODE BIOPSY Right 03/07/2020   Procedure: RIGHT MASTECTOMY WITH SENTINEL LYMPH NODE BIOPSY;  Surgeon: Jovita Kussmaul, MD;  Location: [facility];  Service: General;  Laterality: Right;  . NO PAST SURGERIES    . WISDOM TOOTH EXTRACTION      FAMILY HISTORY: Family History  Problem Relation Age of Onset  . Diabetes Maternal Grandmother   . Hypertension Mother   The patient's father is 50 and her mother 45  as of August 2021.  The patient is 3 brothers and 2 sisters.  There is no history of cancer in the family to her knowledge   GYNECOLOGIC HISTORY:  No LMP recorded. (Menstrual status: IUD). Menarche: 44 years  old Age at first live birth: 44 years old Benjamin Perez P 4 LMP irregular Contraceptive: Mirena IUD in place HRT n/a  Hysterectomy? no BSO? no   SOCIAL HISTORY: (updated 01/2020)  Lilit sometimes works for a [employer] but she is currently not working, taking care of the children and the household.  Her significant other Jill [name] works Occupational hygienist.  They are both from [place].  The patient's children are ages 55, 29, 70, and 30 months as of August 2021.  The older 3 children are from a different father and their last name is [name].  (The patient was not married to Mr. [name] so there is no legal issue regarding access to medical records, etc.). The 27-monthold is a child of Jill [name] and MLawtell  The patient attends a local [place]IVES: Not in place   HEALTH MAINTENANCE: Social History   Tobacco Use  . Smoking status: Never Smoker  . Smokeless tobacco: Never Used  Vaping Use  . Vaping Use: Never used  Substance Use Topics  . Alcohol use: Never  . Drug use: Never     Colonoscopy: n/a (age)  PAP: 10/2018, negative  Bone density: n/a (age)   No Known Allergies  Current Outpatient Medications  Medication Sig Dispense Refill  . acetaminophen (TYLENOL) 500 MG tablet Take 500 mg by mouth every 6 (six) hours as needed for moderate pain or headache.    . calcium carbonate (TUMS) 500 MG chewable tablet Chew 1 tablet (200 mg of elemental calcium total) by mouth 3 (three) times daily as needed for indigestion or heartburn. 90 tablet 0  . dexamethasone (DECADRON) 4 MG tablet Take 2 tablets (8 mg total) by mouth 2 (two) times daily. Take decadron  Twice daily the day before taxotere then daily x 3 days after carboplatin  chemo 30 tablet 1  . HYDROcodone-acetaminophen (NORCO/VICODIN) 5-325 MG tablet Take 1-2 tablets by mouth every 6 (six) hours as needed for moderate pain or severe pain. 15 tablet 0  . ibuprofen (ADVIL) 200 MG tablet Take 400 mg by mouth every  6 (six) hours as needed for headache or moderate pain.    .Marland Kitchenlidocaine-prilocaine (EMLA) cream Apply to affected area once (Patient not taking: Reported on 03/25/2020) 30 g 3  . loratadine (CLARITIN) 10 MG tablet Take 1 tablet (10 mg total) by mouth daily. 60 tablet 6  . LORazepam (ATIVAN) 0.5 MG tablet Take 1 tablet (0.5 mg total) by mouth at bedtime as needed (Nausea or vomiting). (Patient not taking: Reported on 03/25/2020) 20 tablet 0  . methocarbamol (ROBAXIN) 750 MG tablet Take 1 tablet (750 mg total) by mouth 4 (four) times daily as needed (use for muscle cramps/pain). 30 tablet 2  . omeprazole (PRILOSEC) 40 MG capsule Take 1 capsule (40 mg total) by mouth daily. 30 capsule 0  . prochlorperazine (COMPAZINE) 10 MG tablet Take 1 tablet (10 mg total) by mouth every 6 (six) hours as needed (Nausea or vomiting). 30 tablet 1   No current facility-administered medications for this visit.    OBJECTIVE: Spanish speaker in no acute distress  Vitals:   05/11/20 0815  BP: 107/61  Resp: 20  Temp: 99 F (37.2 C)  SpO2: 99%  Body mass index is 33.02 kg/m.   Wt Readings from Last 3 Encounters:  05/11/20 158 lb (71.7 kg)  04/20/20 154 lb 12.8 oz (70.2 kg)  04/05/20 150 lb 3.2 oz (68.1 kg)      ECOG FS:1 - Symptomatic but completely ambulatory  Sclerae unicteric, EOMs intact Wearing a mask No cervical or supraclavicular adenopathy Lungs no rales or rhonchi Heart regular rate and rhythm Abd soft, nontender, positive bowel sounds MSK no focal spinal tenderness, no upper extremity lymphedema Neuro: nonfocal, well oriented, appropriate affect Breasts: Deferred   LAB RESULTS:  CMP     Component Value Date/Time   NA 141 04/20/2020 0820   NA 138 12/15/2019 1543   K 3.9 04/20/2020 0820   CL 109 04/20/2020 0820   CO2 26 04/20/2020 0820   GLUCOSE 90 04/20/2020 0820   BUN 14 04/20/2020 0820   BUN 10 12/15/2019 1543   CREATININE 0.63 04/20/2020 0820   CREATININE 0.77 02/22/2020 1353     CALCIUM 9.0 04/20/2020 0820   PROT 6.4 (L) 04/20/2020 0820   PROT 7.3 12/15/2019 1543   ALBUMIN 3.6 04/20/2020 0820   ALBUMIN 4.5 12/15/2019 1543   AST 26 04/20/2020 0820   AST 13 (L) 02/22/2020 1353   ALT 24 04/20/2020 0820   ALT 8 02/22/2020 1353   ALKPHOS 67 04/20/2020 0820   BILITOT 0.4 04/20/2020 0820   BILITOT 0.3 02/22/2020 1353   GFRNONAA >60 04/20/2020 0820   GFRNONAA >60 02/22/2020 1353   GFRAA >60 03/28/2020 0813   GFRAA >60 02/22/2020 1353    No results found for: TOTALPROTELP, ALBUMINELP, A1GS, A2GS, BETS, BETA2SER, GAMS, MSPIKE, SPEI  Lab Results  Component Value Date   WBC 3.5 (L) 05/11/2020   NEUTROABS 1.7 05/11/2020   HGB 11.6 (L) 05/11/2020   HCT 34.8 (L) 05/11/2020   MCV 93.3 05/11/2020   PLT 350 05/11/2020    No results found for: LABCA2  No components found for: ERXVQM086  No results for input(s): INR in the last 168 hours.  No results found for: LABCA2  No results found for: PYP950  No results found for: DTO671  No results found for: IWP809  No results found for: CA2729  No components found for: HGQUANT  No results found for: CEA1 / No results found for: CEA1   No results found for: AFPTUMOR  No results found for: CHROMOGRNA  No results found for: KPAFRELGTCHN, LAMBDASER, KAPLAMBRATIO (kappa/lambda light chains)  Lab Results  Component Value Date   HGBA 97.0 10/10/2018   (Hemoglobinopathy evaluation)   No results found for: LDH  No results found for: IRON, TIBC, IRONPCTSAT (Iron and TIBC)  No results found for: FERRITIN  Urinalysis    Component Value Date/Time   COLORURINE YELLOW 12/31/2017 2039   APPEARANCEUR CLOUDY (A) 12/31/2017 2039   LABSPEC 1.015 12/31/2017 2039   PHURINE 5.0 12/31/2017 2039   GLUCOSEU NEGATIVE 12/31/2017 2039   HGBUR MODERATE (A) 12/31/2017 2039   BILIRUBINUR NEGATIVE 12/31/2017 2039   [place] NEGATIVE 12/31/2017 2039   PROTEINUR NEGATIVE 12/31/2017 2039   NITRITE NEGATIVE 12/31/2017  2039   LEUKOCYTESUR LARGE (A) 12/31/2017 2039     STUDIES: No results found.   ELIGIBLE FOR AVAILABLE RESEARCH PROTOCOL: AET  ASSESSMENT: 44 y.o. [place] woman status post right breast upper outer quadrant biopsy 02/04/2020 for a clinical T2 N0, stage IB invasive ductal carcinoma, grade 2, triple positive, with an MIB-1 of 20%  (1) status post right mastectomy and sentinel lymph node sampling 03/07/2020  for a pT2 pN1, stage IB invasive ductal carcinoma, grade 2, with negative margins.  (a) a total of 4 lymph nodes were removed, one positive  (2) adjuvant chemo immunotherapy will consist of carboplatin, docetaxel, trastuzumab and pertuzumab every 21 days x 6 starting 03/29/2020  (3) trastuzumab/ pertuzumab to continue to complete a year  (a) echo 03/18/2020 shows an ejection fraction in the 60-65% range  (4) adjuvant radiation to follow  (5) genetics testing 03/02/2020 through the [facility] Common Hereditary Cancers Panel. . The Common Hereditary Cancers Panel found no deleterious mutations in APC, ATM, AXIN2, BARD1, BMPR1A, BRCA1, BRCA2, BRIP1, CDH1, CDK4, CDKN2A (p14ARF), CDKN2A (p16INK4a), CHEK2, CTNNA1, DICER1, EPCAM (Deletion/duplication testing only), GREM1 (promoter region deletion/duplication testing only), KIT, MEN1, MLH1, MSH2, MSH3, MSH6, MUTYH, NBN, NF1, NHTL1, PALB2, PDGFRA, PMS2, POLD1, POLE, PTEN, RAD50, RAD51C, RAD51D, RNF43, SDHB, SDHC, SDHD, SMAD4, SMARCA4. STK11, TP53, TSC1, TSC2, and VHL.  The following genes were evaluated for sequence changes only: SDHA and HOXB13 c.251G>A variant only.   (a)  a Variant of uncertain significance (VUS) in [place] at c.3380T>G (p.Ile1127Ser) was noted  (6) tamoxifen started 02/12/2020 in anticipation of possible surgical delays, held during chemotherapy   PLAN: Daejah is tolerating treatment generally well and we are proceeding with cycle #3 today.  Her scheduled for some reason had not been extended and she had some wrong dates.   All that was corrected today.  She requested a letter documenting her situation to see if her father could possibly obtain a visa to visit her.  I was glad to provide that for her.  I am going to eliminate Perjeta from today's cycle.  We will consider reinstating it with cycle 4 depending on how she does in between  Total encounter time 35 minutes.Sarajane Jews C. , MD 05/11/2020 8:40 AM Medical Oncology and Hematology [facility] [place], [place] [address] Tel. [phone]    Fax. [phone]   This document serves as a record of services personally performed by Lurline Del, MD. It was created on his behalf by Wilburn Mylar, a trained medical scribe. The creation of this record is based on the scribe's personal observations and the provider's statements to them.   I, Lurline Del MD, have reviewed the above documentation for accuracy and completeness, and I agree with the above.   *Total Encounter Time as defined by the Centers for Medicare and Medicaid Services includes, in addition to the face-to-face time of a patient visit (documented in the note above) non-face-to-face time: obtaining and reviewing outside history, ordering and reviewing medications, tests or procedures, care coordination (communications with other health care professionals or caregivers) and documentation in the medical record.

## 2020-05-11 ENCOUNTER — Inpatient Hospital Stay: Payer: Self-pay | Attending: Oncology | Admitting: Oncology

## 2020-05-11 ENCOUNTER — Inpatient Hospital Stay: Payer: No Typology Code available for payment source

## 2020-05-11 ENCOUNTER — Other Ambulatory Visit: Payer: Self-pay

## 2020-05-11 ENCOUNTER — Encounter: Payer: Self-pay | Admitting: Oncology

## 2020-05-11 VITALS — BP 107/61 | Temp 99.0°F | Resp 20 | Ht <= 58 in | Wt 158.0 lb

## 2020-05-11 VITALS — HR 81

## 2020-05-11 DIAGNOSIS — Z170 Estrogen receptor positive status [ER+]: Secondary | ICD-10-CM

## 2020-05-11 DIAGNOSIS — Z8249 Family history of ischemic heart disease and other diseases of the circulatory system: Secondary | ICD-10-CM | POA: Insufficient documentation

## 2020-05-11 DIAGNOSIS — Z7952 Long term (current) use of systemic steroids: Secondary | ICD-10-CM | POA: Insufficient documentation

## 2020-05-11 DIAGNOSIS — C50411 Malignant neoplasm of upper-outer quadrant of right female breast: Secondary | ICD-10-CM | POA: Insufficient documentation

## 2020-05-11 DIAGNOSIS — Z79899 Other long term (current) drug therapy: Secondary | ICD-10-CM | POA: Insufficient documentation

## 2020-05-11 DIAGNOSIS — Z5112 Encounter for antineoplastic immunotherapy: Secondary | ICD-10-CM | POA: Insufficient documentation

## 2020-05-11 DIAGNOSIS — Z5111 Encounter for antineoplastic chemotherapy: Secondary | ICD-10-CM | POA: Insufficient documentation

## 2020-05-11 DIAGNOSIS — Z95828 Presence of other vascular implants and grafts: Secondary | ICD-10-CM | POA: Insufficient documentation

## 2020-05-11 DIAGNOSIS — Z833 Family history of diabetes mellitus: Secondary | ICD-10-CM | POA: Insufficient documentation

## 2020-05-11 DIAGNOSIS — Z5189 Encounter for other specified aftercare: Secondary | ICD-10-CM | POA: Insufficient documentation

## 2020-05-11 DIAGNOSIS — Z9011 Acquired absence of right breast and nipple: Secondary | ICD-10-CM | POA: Insufficient documentation

## 2020-05-11 LAB — CBC WITH DIFFERENTIAL/PLATELET
Abs Immature Granulocytes: 0.01 10*3/uL (ref 0.00–0.07)
Basophils Absolute: 0 10*3/uL (ref 0.0–0.1)
Basophils Relative: 1 %
Eosinophils Absolute: 0 10*3/uL (ref 0.0–0.5)
Eosinophils Relative: 1 %
HCT: 34.8 % — ABNORMAL LOW (ref 36.0–46.0)
Hemoglobin: 11.6 g/dL — ABNORMAL LOW (ref 12.0–15.0)
Immature Granulocytes: 0 %
Lymphocytes Relative: 42 %
Lymphs Abs: 1.5 10*3/uL (ref 0.7–4.0)
MCH: 31.1 pg (ref 26.0–34.0)
MCHC: 33.3 g/dL (ref 30.0–36.0)
MCV: 93.3 fL (ref 80.0–100.0)
Monocytes Absolute: 0.3 10*3/uL (ref 0.1–1.0)
Monocytes Relative: 8 %
Neutro Abs: 1.7 10*3/uL (ref 1.7–7.7)
Neutrophils Relative %: 48 %
Platelets: 350 10*3/uL (ref 150–400)
RBC: 3.73 MIL/uL — ABNORMAL LOW (ref 3.87–5.11)
RDW: 15.9 % — ABNORMAL HIGH (ref 11.5–15.5)
WBC: 3.5 10*3/uL — ABNORMAL LOW (ref 4.0–10.5)
nRBC: 0 % (ref 0.0–0.2)

## 2020-05-11 LAB — COMPREHENSIVE METABOLIC PANEL
ALT: 28 U/L (ref 0–44)
AST: 28 U/L (ref 15–41)
Albumin: 3.5 g/dL (ref 3.5–5.0)
Alkaline Phosphatase: 70 U/L (ref 38–126)
Anion gap: 7 (ref 5–15)
BUN: 8 mg/dL (ref 6–20)
CO2: 27 mmol/L (ref 22–32)
Calcium: 8.8 mg/dL — ABNORMAL LOW (ref 8.9–10.3)
Chloride: 108 mmol/L (ref 98–111)
Creatinine, Ser: 0.6 mg/dL (ref 0.44–1.00)
GFR, Estimated: >60 mL/min (ref >60)
Glucose, Bld: 87 mg/dL (ref 70–99)
Potassium: 4.2 mmol/L (ref 3.5–5.1)
Sodium: 142 mmol/L (ref 135–145)
Total Bilirubin: 0.2 mg/dL — ABNORMAL LOW (ref 0.3–1.2)
Total Protein: 6.4 g/dL — ABNORMAL LOW (ref 6.5–8.1)

## 2020-05-11 MED ORDER — ACETAMINOPHEN 325 MG PO TABS
650.0000 mg | ORAL_TABLET | Freq: Once | ORAL | Status: AC
  Administered 2020-05-11: 650 mg via ORAL

## 2020-05-11 MED ORDER — SODIUM CHLORIDE 0.9% FLUSH
10.0000 mL | INTRAVENOUS | Status: DC | PRN
  Administered 2020-05-11: 10 mL
  Filled 2020-05-11: qty 10

## 2020-05-11 MED ORDER — PALONOSETRON HCL INJECTION 0.25 MG/5ML
0.2500 mg | Freq: Once | INTRAVENOUS | Status: AC
  Administered 2020-05-11: 0.25 mg via INTRAVENOUS

## 2020-05-11 MED ORDER — SODIUM CHLORIDE 0.9 % IV SOLN
75.0000 mg/m2 | Freq: Once | INTRAVENOUS | Status: AC
  Administered 2020-05-11: 130 mg via INTRAVENOUS
  Filled 2020-05-11: qty 13

## 2020-05-11 MED ORDER — SODIUM CHLORIDE 0.9 % IV SOLN
10.0000 mg | Freq: Once | INTRAVENOUS | Status: AC
  Administered 2020-05-11: 10 mg via INTRAVENOUS
  Filled 2020-05-11: qty 10

## 2020-05-11 MED ORDER — SODIUM CHLORIDE 0.9% FLUSH
10.0000 mL | Freq: Once | INTRAVENOUS | Status: AC
  Administered 2020-05-11: 10 mL
  Filled 2020-05-11: qty 10

## 2020-05-11 MED ORDER — DIPHENHYDRAMINE HCL 25 MG PO CAPS
25.0000 mg | ORAL_CAPSULE | Freq: Once | ORAL | Status: AC
  Administered 2020-05-11: 25 mg via ORAL

## 2020-05-11 MED ORDER — HEPARIN SOD (PORK) LOCK FLUSH 100 UNIT/ML IV SOLN
500.0000 [IU] | Freq: Once | INTRAVENOUS | Status: AC | PRN
  Administered 2020-05-11: 500 [IU]
  Filled 2020-05-11: qty 5

## 2020-05-11 MED ORDER — SODIUM CHLORIDE 0.9 % IV SOLN
623.0000 mg | Freq: Once | INTRAVENOUS | Status: AC
  Administered 2020-05-11: 620 mg via INTRAVENOUS
  Filled 2020-05-11: qty 62

## 2020-05-11 MED ORDER — ACETAMINOPHEN 325 MG PO TABS
ORAL_TABLET | ORAL | Status: AC
  Filled 2020-05-11: qty 2

## 2020-05-11 MED ORDER — TRASTUZUMAB-ANNS CHEMO 150 MG IV SOLR
6.0000 mg/kg | Freq: Once | INTRAVENOUS | Status: AC
  Administered 2020-05-11: 420 mg via INTRAVENOUS
  Filled 2020-05-11: qty 20

## 2020-05-11 MED ORDER — SODIUM CHLORIDE 0.9 % IV SOLN
150.0000 mg | Freq: Once | INTRAVENOUS | Status: AC
  Administered 2020-05-11: 150 mg via INTRAVENOUS
  Filled 2020-05-11: qty 150

## 2020-05-11 MED ORDER — SODIUM CHLORIDE 0.9 % IV SOLN
Freq: Once | INTRAVENOUS | Status: AC
  Filled 2020-05-11: qty 250

## 2020-05-11 MED ORDER — DIPHENHYDRAMINE HCL 25 MG PO CAPS
ORAL_CAPSULE | ORAL | Status: AC
  Filled 2020-05-11: qty 1

## 2020-05-11 MED ORDER — PALONOSETRON HCL INJECTION 0.25 MG/5ML
INTRAVENOUS | Status: AC
  Filled 2020-05-11: qty 5

## 2020-05-11 NOTE — Patient Instructions (Signed)
[facility] Discharge Instructions for Patients Receiving Chemotherapy  Today you received the following immunotherapy agent: Trastuzumab and chemotherapy agents: Docetaxel (Taxotere) and Carboplatin.  To help prevent nausea and vomiting after your treatment, we encourage you to take your nausea medication as directed by your MD.   If you develop nausea and vomiting that is not controlled by your nausea medication, call the clinic.   BELOW ARE SYMPTOMS THAT SHOULD BE REPORTED IMMEDIATELY:  *FEVER GREATER THAN 100.5 F  *CHILLS WITH OR WITHOUT FEVER  NAUSEA AND VOMITING THAT IS NOT CONTROLLED WITH YOUR NAUSEA MEDICATION  *UNUSUAL SHORTNESS OF BREATH  *UNUSUAL BRUISING OR BLEEDING  TENDERNESS IN MOUTH AND THROAT WITH OR WITHOUT PRESENCE OF ULCERS  *URINARY PROBLEMS  *BOWEL PROBLEMS  UNUSUAL RASH Items with * indicate a potential emergency and should be followed up as soon as possible.  Feel free to call the clinic should you have any questions or concerns. The clinic phone number is [phone].  Please show the [facility] at check-in to the Emergency Department and triage nurse.

## 2020-05-12 ENCOUNTER — Inpatient Hospital Stay: Payer: No Typology Code available for payment source

## 2020-05-12 ENCOUNTER — Encounter: Payer: Self-pay | Admitting: *Deleted

## 2020-05-13 ENCOUNTER — Inpatient Hospital Stay: Payer: Self-pay

## 2020-05-13 ENCOUNTER — Other Ambulatory Visit: Payer: Self-pay

## 2020-05-13 VITALS — BP 115/74 | HR 75 | Resp 18

## 2020-05-13 DIAGNOSIS — C50411 Malignant neoplasm of upper-outer quadrant of right female breast: Secondary | ICD-10-CM

## 2020-05-13 DIAGNOSIS — Z170 Estrogen receptor positive status [ER+]: Secondary | ICD-10-CM

## 2020-05-13 MED ORDER — PEGFILGRASTIM-CBQV 6 MG/0.6ML ~~LOC~~ SOSY
6.0000 mg | PREFILLED_SYRINGE | Freq: Once | SUBCUTANEOUS | Status: AC
  Administered 2020-05-13: 6 mg via SUBCUTANEOUS

## 2020-05-13 MED ORDER — PEGFILGRASTIM-CBQV 6 MG/0.6ML ~~LOC~~ SOSY
PREFILLED_SYRINGE | SUBCUTANEOUS | Status: AC
  Filled 2020-05-13: qty 0.6

## 2020-05-13 NOTE — Patient Instructions (Signed)

## 2020-05-16 ENCOUNTER — Ambulatory Visit: Payer: No Typology Code available for payment source | Admitting: Physical Therapy

## 2020-05-16 ENCOUNTER — Ambulatory Visit: Payer: No Typology Code available for payment source

## 2020-05-31 NOTE — Progress Notes (Signed)
[place]  [phone] [phone]     ID: Jill [name] DOB: [birth year]  MR#: [id number]  OIN#:[id number]  Patient Care Team: Gildardo Pounds, NP as PCP - General (Nurse Practitioner) Imojean Yoshino, Virgie Dad, MD as Consulting Physician (Oncology) Jovita Kussmaul, MD as Consulting Physician (General Surgery) Dillingham, Loel Lofty, DO as Attending Physician (Plastic Surgery) Mauro Kaufmann, RN as Oncology Nurse Navigator Rockwell Germany, RN as Oncology Nurse Navigator Chauncey Cruel, MD OTHER MD:  CHIEF COMPLAINT: triple positive breast cancer (s/p right mastectomy)  CURRENT TREATMENT: Adjuvant chemotherapy   INTERVAL HISTORY: Jill [name] returns today for follow up and treatment of her triple positive breast cancer.  She is accompanied by an interpreter  [place] began adjuvant chemotherapy, consisting of carboplatin, docetaxel, trastuzumab and pertuzumab every 21 days x6, on 03/29/2020. She received udenyca day 3. Today is day 1 cycle 4.  Her most recent echocardiogram on 03/18/2020 showed an ejection fraction of 60-65%.  She is scheduled for repeat echocardiography 06/29/2020  REVIEW OF SYSTEMS: Jill [name] is feeling much better.  She had no diarrhea this cycle, after we omitted the Perjeta.  She did develop a rash in both lower legs, right greater than left.  The one on the left is pretty much resolved.  There is still a little bit of a rash on the right leg which is described below.  She enjoyed Thanksgiving's with her family.  She has had no headaches, no visual changes, no nausea or vomiting, no dizziness, balance issues or falls.  A detailed review of systems was otherwise stable.   COVID 19 VACCINATION STATUS: fully vaccinated [employer]), no booster as of December 2021   HISTORY OF CURRENT ILLNESS: From the original intake note:  "Jill [name]" herself palpated a right breast abnormality sometime in 2019.,  She did not pay much mind.  This  was noted again during her pregnancy and she underwent bilateral diagnostic mammography with tomography and right breast ultrasonography at [facility] on 01/21/2020 showing: breast density category C; 4.1 cm irregular mass with associated group of pleomorphic calcifications measuring approximately 4.9 cm, located at site of palpable concern in upper-outer right breast at 9:30; no right axillary lymphadenopathy.  Accordingly on 02/04/2020 she proceeded to biopsy of the right breast area in question. The pathology from this procedure ([id number]) showed: invasive and in situ mammary carcinoma, e-cadherin positive, grade 2. Prognostic indicators significant for: estrogen receptor, 100% positive and progesterone receptor, 100% positive, both with strong staining intensity. Proliferation marker Ki67 at 20%. HER2 equivocal by immunohistochemistry (2+), but positive by fluorescent in situ hybridization with a signals ratio 5.39 and number per cell 11.05.  The patient's subsequent history is as detailed below.   PAST MEDICAL HISTORY: Past Medical History:  Diagnosis Date  . Infected cyst of Bartholin's gland duct   . Medical history non-contributory     PAST SURGICAL HISTORY: Past Surgical History:  Procedure Laterality Date  . IR IMAGING GUIDED PORT INSERTION  03/02/2020  . MASTECTOMY W/ SENTINEL NODE BIOPSY Right 03/07/2020   Procedure: RIGHT MASTECTOMY WITH SENTINEL LYMPH NODE BIOPSY;  Surgeon: Jovita Kussmaul, MD;  Location: [facility];  Service: General;  Laterality: Right;  . NO PAST SURGERIES    . WISDOM TOOTH EXTRACTION      FAMILY HISTORY: Family History  Problem Relation Age of Onset  . Diabetes Maternal Grandmother   . Hypertension Mother   The patient's father is 27 and her mother  63 as of August 2021.  The patient is 3 brothers and 2 sisters.  There is no history of cancer in the family to her knowledge   GYNECOLOGIC HISTORY:  No LMP recorded. (Menstrual  status: IUD). Menarche: 44 years old Age at first live birth: 44 years old [place] P 4 LMP irregular Contraceptive: Mirena IUD in place HRT n/a  Hysterectomy? no BSO? 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  Her significant other [name] works Occupational hygienist.  They are both from [place].  The patient's children are ages 55, 80, 16, and 64 months as of August 2021.  The older 3 children are from a different father and their last name is [name].  (The patient was not married to Mr. [name] so there is no legal issue regarding access to medical records, etc.). The 44-month-old is a child of Jill [name] and Carl.  The patient attends a local [employer] DIRECTIVES: Not in place   HEALTH MAINTENANCE: Social History   Tobacco Use  . Smoking status: Never Smoker  . Smokeless tobacco: Never Used  Vaping Use  . Vaping Use: Never used  Substance Use Topics  . Alcohol use: Never  . Drug use: Never     Colonoscopy: n/a (age)  PAP: 10/2018, negative  Bone density: n/a (age)   No Known Allergies  Current Outpatient Medications  Medication Sig Dispense Refill  . acetaminophen (TYLENOL) 500 MG tablet Take 500 mg by mouth every 6 (six) hours as needed for moderate pain or headache.    . calcium carbonate (TUMS) 500 MG chewable tablet Chew 1 tablet (200 mg of elemental calcium total) by mouth 3 (three) times daily as needed for indigestion or heartburn. 90 tablet 0  . dexamethasone (DECADRON) 4 MG tablet Take 2 tablets (8 mg total) by mouth 2 (two) times daily. Take decadron  Twice daily the day before taxotere then daily x 3 days after carboplatin  chemo 30 tablet 1  . HYDROcodone-acetaminophen (NORCO/VICODIN) 5-325 MG tablet Take 1-2 tablets by mouth every 6 (six) hours as needed for moderate pain or severe pain. 15 tablet 0  . ibuprofen (ADVIL) 200 MG  tablet Take 400 mg by mouth every 6 (six) hours as needed for headache or moderate pain.    Marland Kitchen lidocaine-prilocaine (EMLA) cream Apply to affected area once (Patient not taking: Reported on 03/25/2020) 30 g 3  . loratadine (CLARITIN) 10 MG tablet Take 1 tablet (10 mg total) by mouth daily. 60 tablet 6  . LORazepam (ATIVAN) 0.5 MG tablet Take 1 tablet (0.5 mg total) by mouth at bedtime as needed (Nausea or vomiting). (Patient not taking: Reported on 03/25/2020) 20 tablet 0  . methocarbamol (ROBAXIN) 750 MG tablet Take 1 tablet (750 mg total) by mouth 4 (four) times daily as needed (use for muscle cramps/pain). 30 tablet 2  . omeprazole (PRILOSEC) 40 MG capsule Take 1 capsule (40 mg total) by mouth daily. 30 capsule 0  . prochlorperazine (COMPAZINE) 10 MG tablet Take 1 tablet (10 mg total) by mouth every 6 (six) hours as needed (Nausea or vomiting). 30 tablet 1   No current facility-administered medications for this visit.    OBJECTIVE: Spanish speaker who appears well  Vitals:   06/01/20 0817  BP: (!) 103/55  Pulse: 89  Resp: 17  Temp: 99.3 F (37.4 C)  SpO2:  100%     Body mass index is 32.9 kg/m.   Wt Readings from Last 3 Encounters:  06/01/20 157 lb 6.4 oz (71.4 kg)  05/11/20 158 lb (71.7 kg)  04/20/20 154 lb 12.8 oz (70.2 kg)      ECOG FS:1 - Symptomatic but completely ambulatory  Sclerae unicteric, EOMs intact Wearing a mask No cervical or supraclavicular adenopathy Lungs no rales or rhonchi Heart regular rate and rhythm Abd soft, nontender, positive bowel sounds MSK no focal spinal tenderness, no upper extremity lymphedema Neuro: nonfocal, well oriented, appropriate affect Breasts: Deferred   LAB RESULTS:  CMP     Component Value Date/Time   NA 142 05/11/2020 0807   NA 138 12/15/2019 1543   K 4.2 05/11/2020 0807   CL 108 05/11/2020 0807   CO2 27 05/11/2020 0807   GLUCOSE 87 05/11/2020 0807   BUN 8 05/11/2020 0807   BUN 10 12/15/2019 1543   CREATININE 0.60  05/11/2020 0807   CREATININE 0.77 02/22/2020 1353   CALCIUM 8.8 (L) 05/11/2020 0807   PROT 6.4 (L) 05/11/2020 0807   PROT 7.3 12/15/2019 1543   ALBUMIN 3.5 05/11/2020 0807   ALBUMIN 4.5 12/15/2019 1543   AST 28 05/11/2020 0807   AST 13 (L) 02/22/2020 1353   ALT 28 05/11/2020 0807   ALT 8 02/22/2020 1353   ALKPHOS 70 05/11/2020 0807   BILITOT 0.2 (L) 05/11/2020 0807   BILITOT 0.3 02/22/2020 1353   GFRNONAA >60 05/11/2020 0807   GFRNONAA >60 02/22/2020 1353   GFRAA >60 03/28/2020 0813   GFRAA >60 02/22/2020 1353    No results found for: TOTALPROTELP, ALBUMINELP, A1GS, A2GS, BETS, BETA2SER, GAMS, MSPIKE, SPEI  Lab Results  Component Value Date   WBC 3.7 (L) 06/01/2020   NEUTROABS 1.8 06/01/2020   HGB 11.9 (L) 06/01/2020   HCT 34.9 (L) 06/01/2020   MCV 94.6 06/01/2020   PLT 382 06/01/2020    No results found for: LABCA2  No components found for: VQMGQQ761  No results for input(s): INR in the last 168 hours.  No results found for: LABCA2  No results found for: PJK932  No results found for: IZT245  No results found for: YKD983  No results found for: CA2729  No components found for: HGQUANT  No results found for: CEA1 / No results found for: CEA1   No results found for: AFPTUMOR  No results found for: CHROMOGRNA  No results found for: KPAFRELGTCHN, LAMBDASER, KAPLAMBRATIO (kappa/lambda light chains)  Lab Results  Component Value Date   HGBA 97.0 10/10/2018   (Hemoglobinopathy evaluation)   No results found for: LDH  No results found for: IRON, TIBC, IRONPCTSAT (Iron and TIBC)  No results found for: FERRITIN  Urinalysis    Component Value Date/Time   COLORURINE YELLOW 12/31/2017 2039   APPEARANCEUR CLOUDY (A) 12/31/2017 2039   LABSPEC 1.015 12/31/2017 2039   PHURINE 5.0 12/31/2017 2039   GLUCOSEU NEGATIVE 12/31/2017 2039   HGBUR MODERATE (A) 12/31/2017 2039   BILIRUBINUR NEGATIVE 12/31/2017 2039   Purvis NEGATIVE 12/31/2017 2039    PROTEINUR NEGATIVE 12/31/2017 2039   NITRITE NEGATIVE 12/31/2017 2039   LEUKOCYTESUR LARGE (A) 12/31/2017 2039     STUDIES: No results found.   ELIGIBLE FOR AVAILABLE RESEARCH PROTOCOL: AET  ASSESSMENT: 44 y.o. [place] woman status post right breast upper outer quadrant biopsy 02/04/2020 for a clinical T2 N0, stage IB invasive ductal carcinoma, grade 2, triple positive, with an MIB-1 of 20%  (1) status post right mastectomy  and sentinel lymph node sampling 03/07/2020 for a pT2 pN1, stage IB invasive ductal carcinoma, grade 2, with negative margins.  (a) a total of 4 lymph nodes were removed, one positive  (2) adjuvant chemo immunotherapy will consist of carboplatin, docetaxel, trastuzumab and pertuzumab every 21 days x 6 starting 03/29/2020  (a) prtuzumab discontinued after cycle 2 secondary to diarrhea  (3) trastuzumab to continue to complete a year  (a) echo 03/18/2020 shows an ejection fraction in the 60-65% range  (4) adjuvant radiation to follow  (5) genetics testing 03/02/2020 through the [facility] Common Hereditary Cancers Panel. . The Common Hereditary Cancers Panel found no deleterious mutations in APC, ATM, AXIN2, BARD1, BMPR1A, BRCA1, BRCA2, BRIP1, CDH1, CDK4, CDKN2A (p14ARF), CDKN2A (p16INK4a), CHEK2, CTNNA1, DICER1, EPCAM (Deletion/duplication testing only), GREM1 (promoter region deletion/duplication testing only), KIT, MEN1, MLH1, MSH2, MSH3, MSH6, MUTYH, NBN, NF1, NHTL1, PALB2, PDGFRA, PMS2, POLD1, POLE, PTEN, RAD50, RAD51C, RAD51D, RNF43, SDHB, SDHC, SDHD, SMAD4, SMARCA4. STK11, TP53, TSC1, TSC2, and VHL.  The following genes were evaluated for sequence changes only: SDHA and HOXB13 c.251G>A variant only.   (a)  a Variant of uncertain significance (VUS) in [place] at c.3380T>G (p.Ile1127Ser) was noted  (6) tamoxifen started 02/12/2020 in anticipation of possible surgical delays, held during chemotherapy   PLAN: Jill [name] did much better with her 3rd cycle of chemotherapy  and I am going to omit the Perjeta from subsequent treatments as she did very poorly with this medication.  Today we reviewed her plan.  She thought her treatments would end up with chemotherapy but we discussed the fact that she will continue to receive anti-HER-2 treatment to total 1 year and therefore we will keep her port and will need to be here every 3 weeks until September of next year.  She understands also she needs to have echocardiography every 3 months with the next one scheduled for January.  She had run out of some medications and I have refilled those.  I am also starting her on Valtrex days 1 through 7 of each cycle since she does get some mouth sores usually days 3 or 4 of a chemo cycle.  I have encouraged her to be as active as possible.  She will receive her COVID-19 booster after the completion of chemotherapy.  She has all her appointments through her final chemotherapy ointment and I will see her on that particular day  Incidentally the letter I wrote to see if her father could come visit her was helpful and he has been granted a temporary visa.  Total encounter time 25 minutes.Jill [name] Jews C. Vance Hochmuth, MD 06/01/2020 8:30 AM Medical Oncology and Hematology [facility] [place], [place] [address] Tel. [phone]    Fax. [phone]   This document serves as a record of services personally performed by Lurline Del, MD. It was created on his behalf by Wilburn Mylar, a trained medical scribe. The creation of this record is based on the scribe's personal observations and the provider's statements to them.   I, Lurline Del MD, have reviewed the above documentation for accuracy and completeness, and I agree with the above.   *Total Encounter Time as defined by the Centers for Medicare and Medicaid Services includes, in addition to the face-to-face time of a patient visit (documented in the note above) non-face-to-face time: obtaining and  reviewing outside history, ordering and reviewing medications, tests or procedures, care coordination (communications with other health care professionals or caregivers) and documentation in the  medical record.

## 2020-06-01 ENCOUNTER — Inpatient Hospital Stay: Payer: No Typology Code available for payment source

## 2020-06-01 ENCOUNTER — Inpatient Hospital Stay (HOSPITAL_BASED_OUTPATIENT_CLINIC_OR_DEPARTMENT_OTHER): Payer: No Typology Code available for payment source | Admitting: Oncology

## 2020-06-01 ENCOUNTER — Other Ambulatory Visit: Payer: Self-pay

## 2020-06-01 ENCOUNTER — Other Ambulatory Visit (HOSPITAL_COMMUNITY): Payer: Self-pay | Admitting: Oncology

## 2020-06-01 ENCOUNTER — Inpatient Hospital Stay: Payer: No Typology Code available for payment source | Attending: Oncology

## 2020-06-01 VITALS — BP 103/55 | HR 89 | Temp 99.3°F | Resp 17 | Ht <= 58 in | Wt 157.4 lb

## 2020-06-01 DIAGNOSIS — Z95828 Presence of other vascular implants and grafts: Secondary | ICD-10-CM

## 2020-06-01 DIAGNOSIS — Z5189 Encounter for other specified aftercare: Secondary | ICD-10-CM | POA: Insufficient documentation

## 2020-06-01 DIAGNOSIS — Z170 Estrogen receptor positive status [ER+]: Secondary | ICD-10-CM | POA: Insufficient documentation

## 2020-06-01 DIAGNOSIS — Z79899 Other long term (current) drug therapy: Secondary | ICD-10-CM | POA: Insufficient documentation

## 2020-06-01 DIAGNOSIS — Z833 Family history of diabetes mellitus: Secondary | ICD-10-CM | POA: Insufficient documentation

## 2020-06-01 DIAGNOSIS — C50411 Malignant neoplasm of upper-outer quadrant of right female breast: Secondary | ICD-10-CM

## 2020-06-01 DIAGNOSIS — Z5111 Encounter for antineoplastic chemotherapy: Secondary | ICD-10-CM | POA: Insufficient documentation

## 2020-06-01 DIAGNOSIS — Z8249 Family history of ischemic heart disease and other diseases of the circulatory system: Secondary | ICD-10-CM | POA: Insufficient documentation

## 2020-06-01 DIAGNOSIS — Z5112 Encounter for antineoplastic immunotherapy: Secondary | ICD-10-CM | POA: Insufficient documentation

## 2020-06-01 DIAGNOSIS — Z9011 Acquired absence of right breast and nipple: Secondary | ICD-10-CM | POA: Insufficient documentation

## 2020-06-01 LAB — COMPREHENSIVE METABOLIC PANEL
ALT: 21 U/L (ref 0–44)
AST: 23 U/L (ref 15–41)
Albumin: 3.7 g/dL (ref 3.5–5.0)
Alkaline Phosphatase: 73 U/L (ref 38–126)
Anion gap: 8 (ref 5–15)
BUN: 9 mg/dL (ref 6–20)
CO2: 26 mmol/L (ref 22–32)
Calcium: 9.3 mg/dL (ref 8.9–10.3)
Chloride: 108 mmol/L (ref 98–111)
Creatinine, Ser: 0.71 mg/dL (ref 0.44–1.00)
GFR, Estimated: >60 mL/min (ref >60)
Glucose, Bld: 93 mg/dL (ref 70–99)
Potassium: 4.4 mmol/L (ref 3.5–5.1)
Sodium: 142 mmol/L (ref 135–145)
Total Bilirubin: 0.3 mg/dL (ref 0.3–1.2)
Total Protein: 6.5 g/dL (ref 6.5–8.1)

## 2020-06-01 LAB — CBC WITH DIFFERENTIAL/PLATELET
Abs Immature Granulocytes: 0.01 10*3/uL (ref 0.00–0.07)
Basophils Absolute: 0 10*3/uL (ref 0.0–0.1)
Basophils Relative: 1 %
Eosinophils Absolute: 0 10*3/uL (ref 0.0–0.5)
Eosinophils Relative: 0 %
HCT: 34.9 % — ABNORMAL LOW (ref 36.0–46.0)
Hemoglobin: 11.9 g/dL — ABNORMAL LOW (ref 12.0–15.0)
Immature Granulocytes: 0 %
Lymphocytes Relative: 39 %
Lymphs Abs: 1.5 10*3/uL (ref 0.7–4.0)
MCH: 32.2 pg (ref 26.0–34.0)
MCHC: 34.1 g/dL (ref 30.0–36.0)
MCV: 94.6 fL (ref 80.0–100.0)
Monocytes Absolute: 0.4 10*3/uL (ref 0.1–1.0)
Monocytes Relative: 10 %
Neutro Abs: 1.8 10*3/uL (ref 1.7–7.7)
Neutrophils Relative %: 50 %
Platelets: 382 10*3/uL (ref 150–400)
RBC: 3.69 MIL/uL — ABNORMAL LOW (ref 3.87–5.11)
RDW: 16.5 % — ABNORMAL HIGH (ref 11.5–15.5)
WBC: 3.7 10*3/uL — ABNORMAL LOW (ref 4.0–10.5)
nRBC: 0 % (ref 0.0–0.2)

## 2020-06-01 MED ORDER — ACETAMINOPHEN 325 MG PO TABS
ORAL_TABLET | ORAL | Status: AC
  Filled 2020-06-01: qty 2

## 2020-06-01 MED ORDER — SODIUM CHLORIDE 0.9% FLUSH
10.0000 mL | INTRAVENOUS | Status: DC | PRN
  Administered 2020-06-01: 10 mL
  Filled 2020-06-01: qty 10

## 2020-06-01 MED ORDER — DIPHENHYDRAMINE HCL 25 MG PO CAPS
25.0000 mg | ORAL_CAPSULE | Freq: Once | ORAL | Status: AC
  Administered 2020-06-01: 25 mg via ORAL

## 2020-06-01 MED ORDER — SODIUM CHLORIDE 0.9 % IV SOLN
10.0000 mg | Freq: Once | INTRAVENOUS | Status: AC
  Administered 2020-06-01: 10 mg via INTRAVENOUS
  Filled 2020-06-01: qty 10

## 2020-06-01 MED ORDER — PROCHLORPERAZINE MALEATE 10 MG PO TABS: 10.0000 mg | ORAL_TABLET | Freq: Four times a day (QID) | ORAL | 1 refills | Status: DC | PRN

## 2020-06-01 MED ORDER — ACETAMINOPHEN 325 MG PO TABS
650.0000 mg | ORAL_TABLET | Freq: Once | ORAL | Status: AC
  Administered 2020-06-01: 650 mg via ORAL

## 2020-06-01 MED ORDER — SODIUM CHLORIDE 0.9 % IV SOLN
Freq: Once | INTRAVENOUS | Status: AC
  Filled 2020-06-01: qty 250

## 2020-06-01 MED ORDER — PALONOSETRON HCL INJECTION 0.25 MG/5ML
0.2500 mg | Freq: Once | INTRAVENOUS | Status: AC
  Administered 2020-06-01: 0.25 mg via INTRAVENOUS

## 2020-06-01 MED ORDER — DIPHENHYDRAMINE HCL 25 MG PO CAPS
ORAL_CAPSULE | ORAL | Status: AC
  Filled 2020-06-01: qty 1

## 2020-06-01 MED ORDER — VALACYCLOVIR HCL 1 G PO TABS: 1000.0000 mg | ORAL_TABLET | Freq: Every day | ORAL | 0 refills | Status: DC

## 2020-06-01 MED ORDER — DEXAMETHASONE 4 MG PO TABS: 8.0000 mg | ORAL_TABLET | Freq: Two times a day (BID) | ORAL | 1 refills | Status: DC

## 2020-06-01 MED ORDER — HEPARIN SOD (PORK) LOCK FLUSH 100 UNIT/ML IV SOLN
500.0000 [IU] | Freq: Once | INTRAVENOUS | Status: AC | PRN
  Administered 2020-06-01: 500 [IU]
  Filled 2020-06-01: qty 5

## 2020-06-01 MED ORDER — PALONOSETRON HCL INJECTION 0.25 MG/5ML
INTRAVENOUS | Status: AC
  Filled 2020-06-01: qty 5

## 2020-06-01 MED ORDER — SODIUM CHLORIDE 0.9 % IV SOLN
75.0000 mg/m2 | Freq: Once | INTRAVENOUS | Status: AC
  Administered 2020-06-01: 130 mg via INTRAVENOUS
  Filled 2020-06-01: qty 13

## 2020-06-01 MED ORDER — SODIUM CHLORIDE 0.9 % IV SOLN
623.0000 mg | Freq: Once | INTRAVENOUS | Status: AC
  Administered 2020-06-01: 620 mg via INTRAVENOUS
  Filled 2020-06-01: qty 62

## 2020-06-01 MED ORDER — SODIUM CHLORIDE 0.9% FLUSH
10.0000 mL | Freq: Once | INTRAVENOUS | Status: AC
  Administered 2020-06-01: 10 mL
  Filled 2020-06-01: qty 10

## 2020-06-01 MED ORDER — OMEPRAZOLE 40 MG PO CPDR
40.0000 mg | DELAYED_RELEASE_CAPSULE | Freq: Every day | ORAL | 0 refills | Status: DC
Start: 2020-06-01 — End: 2020-08-24

## 2020-06-01 MED ORDER — HYDROCODONE-ACETAMINOPHEN 5-325 MG PO TABS: 1.0000 | ORAL_TABLET | Freq: Four times a day (QID) | ORAL | 0 refills | Status: DC | PRN

## 2020-06-01 MED ORDER — METHOCARBAMOL 750 MG PO TABS: 750.0000 mg | ORAL_TABLET | Freq: Four times a day (QID) | ORAL | 2 refills | Status: DC | PRN

## 2020-06-01 MED ORDER — SODIUM CHLORIDE 0.9 % IV SOLN
150.0000 mg | Freq: Once | INTRAVENOUS | Status: AC
  Administered 2020-06-01: 150 mg via INTRAVENOUS
  Filled 2020-06-01: qty 150

## 2020-06-01 MED ORDER — TRASTUZUMAB-ANNS CHEMO 150 MG IV SOLR
6.0000 mg/kg | Freq: Once | INTRAVENOUS | Status: AC
  Administered 2020-06-01: 420 mg via INTRAVENOUS
  Filled 2020-06-01: qty 20

## 2020-06-01 MED FILL — HYDROCODON-APAP 5-325: 5-325 | 2 days supply | Qty: 15 | Fill #0

## 2020-06-01 MED FILL — valACYclovir HCL 1 GM TABS: 1 | 30 days supply | Qty: 30 | Fill #0

## 2020-06-01 MED FILL — PROCHLORPERAZINE 10 MG TAB: 10 | 8 days supply | Qty: 30 | Fill #0

## 2020-06-01 MED FILL — OMEPRAZOLE 40 MG CPDR: 40 | 30 days supply | Qty: 30 | Fill #0

## 2020-06-01 MED FILL — METHOCARBAMOL 750 MG TABS: 750 | 8 days supply | Qty: 30 | Fill #0

## 2020-06-01 MED FILL — DEXAMETHASONE 4 MG TABLET: 4 | 9 days supply | Qty: 30 | Fill #0

## 2020-06-01 NOTE — Progress Notes (Signed)
Patient stable at discharge, ambulatory to lobby with no assistance needed.

## 2020-06-01 NOTE — Patient Instructions (Signed)
[facility] Discharge Instructions for Patients Receiving Chemotherapy  Today you received the following immunotherapy agent: Trastuzumab and chemotherapy agents: Docetaxel (Taxotere) and Carboplatin.  To help prevent nausea and vomiting after your treatment, we encourage you to take your nausea medication as directed by your MD.   If you develop nausea and vomiting that is not controlled by your nausea medication, call the clinic.   BELOW ARE SYMPTOMS THAT SHOULD BE REPORTED IMMEDIATELY:  *FEVER GREATER THAN 100.5 F  *CHILLS WITH OR WITHOUT FEVER  NAUSEA AND VOMITING THAT IS NOT CONTROLLED WITH YOUR NAUSEA MEDICATION  *UNUSUAL SHORTNESS OF BREATH  *UNUSUAL BRUISING OR BLEEDING  TENDERNESS IN MOUTH AND THROAT WITH OR WITHOUT PRESENCE OF ULCERS  *URINARY PROBLEMS  *BOWEL PROBLEMS  UNUSUAL RASH Items with * indicate a potential emergency and should be followed up as soon as possible.  Feel free to call the clinic should you have any questions or concerns. The clinic phone number is [phone].  Please show the [facility] at check-in to the Emergency Department and triage nurse.

## 2020-06-02 ENCOUNTER — Telehealth: Payer: Self-pay | Admitting: Oncology

## 2020-06-02 ENCOUNTER — Ambulatory Visit: Payer: No Typology Code available for payment source

## 2020-06-02 NOTE — Telephone Encounter (Signed)
No 12/8 los. No changes made to pt's schedule.  

## 2020-06-03 ENCOUNTER — Inpatient Hospital Stay: Payer: Self-pay

## 2020-06-03 ENCOUNTER — Other Ambulatory Visit: Payer: Self-pay

## 2020-06-03 VITALS — BP 105/67 | HR 88 | Resp 18

## 2020-06-03 DIAGNOSIS — Z170 Estrogen receptor positive status [ER+]: Secondary | ICD-10-CM

## 2020-06-03 DIAGNOSIS — C50411 Malignant neoplasm of upper-outer quadrant of right female breast: Secondary | ICD-10-CM

## 2020-06-03 MED ORDER — PEGFILGRASTIM-CBQV 6 MG/0.6ML ~~LOC~~ SOSY
PREFILLED_SYRINGE | SUBCUTANEOUS | Status: AC
  Filled 2020-06-03: qty 0.6

## 2020-06-03 MED ORDER — PEGFILGRASTIM-CBQV 6 MG/0.6ML ~~LOC~~ SOSY
6.0000 mg | PREFILLED_SYRINGE | Freq: Once | SUBCUTANEOUS | Status: AC
  Administered 2020-06-03: 6 mg via SUBCUTANEOUS

## 2020-06-13 ENCOUNTER — Ambulatory Visit: Payer: No Typology Code available for payment source

## 2020-06-20 NOTE — Progress Notes (Deleted)
[name]  [phone] [phone]     ID: Jill [name] DOB: [birth year]  MR#: [id number]  EXH#:[id number]  Patient Care Team: Gildardo Pounds, NP as PCP - General (Nurse Practitioner) Magrinat, Virgie Dad, MD as Consulting Physician (Oncology) Jovita Kussmaul, MD as Consulting Physician (General Surgery) Dillingham, Loel Lofty, DO as Attending Physician (Plastic Surgery) Mauro Kaufmann, RN as Oncology Nurse Navigator Rockwell Germany, RN as Oncology Nurse Navigator Scot Dock, NP OTHER MD:  CHIEF COMPLAINT: triple positive breast cancer (s/p right mastectomy)  CURRENT TREATMENT: Adjuvant chemotherapy   INTERVAL HISTORY: Jill [name] returns today for follow up and treatment of her triple positive breast cancer.  She is accompanied by an interpreter  [name] began adjuvant chemotherapy, consisting of carboplatin, docetaxel, trastuzumab and pertuzumab every 21 days x6, on 03/29/2020. Pertuzumab was omitted after cycle 3 due to diarrhea.    Her most recent echocardiogram on 03/18/2020 showed an ejection fraction of 60-65%.  She is scheduled for repeat echocardiography 06/29/2020  REVIEW OF SYSTEMS: Jill [name] 20 VACCINATION STATUS: fully vaccinated [employer]), no booster as of December 2021   HISTORY OF CURRENT ILLNESS: From the original intake note:  "Jill [name]" herself palpated a right breast abnormality sometime in 2019.,  She did not pay much mind.  This was noted again during her pregnancy and she underwent bilateral diagnostic mammography with tomography and right breast ultrasonography at [facility] on 01/21/2020 showing: breast density category C; 4.1 cm irregular mass with associated group of pleomorphic calcifications measuring approximately 4.9 cm, located at site of palpable concern in upper-outer right breast at 9:30; no right axillary lymphadenopathy.  Accordingly on 02/04/2020 she proceeded to biopsy of the right  breast area in question. The pathology from this procedure ([id number]) showed: invasive and in situ mammary carcinoma, e-cadherin positive, grade 2. Prognostic indicators significant for: estrogen receptor, 100% positive and progesterone receptor, 100% positive, both with strong staining intensity. Proliferation marker Ki67 at 20%. HER2 equivocal by immunohistochemistry (2+), but positive by fluorescent in situ hybridization with a signals ratio 5.39 and number per cell 11.05.  The patient's subsequent history is as detailed below.   PAST MEDICAL HISTORY: Past Medical History:  Diagnosis Date  . Infected cyst of Bartholin's gland duct   . Medical history non-contributory     PAST SURGICAL HISTORY: Past Surgical History:  Procedure Laterality Date  . IR IMAGING GUIDED PORT INSERTION  03/02/2020  . MASTECTOMY W/ SENTINEL NODE BIOPSY Right 03/07/2020   Procedure: RIGHT MASTECTOMY WITH SENTINEL LYMPH NODE BIOPSY;  Surgeon: Jovita Kussmaul, MD;  Location: [facility];  Service: General;  Laterality: Right;  . NO PAST SURGERIES    . WISDOM TOOTH EXTRACTION      FAMILY HISTORY: Family History  Problem Relation Age of Onset  . Diabetes Maternal Grandmother   . Hypertension Mother   The patient's father is 43 and her mother 43 as of August 2021.  The patient is 3 brothers and 2 sisters.  There is no history of cancer in the family to her knowledge   GYNECOLOGIC HISTORY:  No LMP recorded. (Menstrual status: IUD). Menarche: 44 years old Age at first live birth: 44 years old Jill [name] P 4 LMP irregular Contraceptive: Mirena IUD in place HRT n/a  Hysterectomy? no BSO? no   SOCIAL HISTORY: (updated 01/2020)  Sarenity sometimes works for a Arbor[employer] but she is currently not working, taking care of the children  and the household.  Her significant other [name] works Occupational hygienist.  They are both from [place].  The patient's children are ages 38, 44, 74, and 53 months as of  August 2021.  The older 3 children are from a different father and their last name is [name].  (The patient was not married to Mr. [name] so there is no legal issue regarding access to medical records, etc.). The 4-monthold is a child of Perlita and [place]  The patient attends a local [place] Not in place   HEALTH MAINTENANCE: Social History   Tobacco Use  . Smoking status: Never Smoker  . Smokeless tobacco: Never Used  Vaping Use  . Vaping Use: Never used  Substance Use Topics  . Alcohol use: Never  . Drug use: Never     Colonoscopy: n/a (age)  PAP: 10/2018, negative  Bone density: n/a (age)   No Known Allergies  Current Outpatient Medications  Medication Sig Dispense Refill  . acetaminophen (TYLENOL) 500 MG tablet Take 500 mg by mouth every 6 (six) hours as needed for moderate pain or headache.    . calcium carbonate (TUMS) 500 MG chewable tablet Chew 1 tablet (200 mg of elemental calcium total) by mouth 3 (three) times daily as needed for indigestion or heartburn. 90 tablet 0  . dexamethasone (DECADRON) 4 MG tablet Take 2 tablets (8 mg total) by mouth 2 (two) times daily. Take decadron  Twice daily the day before taxotere then daily x 3 days after carboplatin  chemo 30 tablet 1  . HYDROcodone-acetaminophen (NORCO/VICODIN) 5-325 MG tablet Take 1-2 tablets by mouth every 6 (six) hours as needed for moderate pain or severe pain. 15 tablet 0  . ibuprofen (ADVIL) 200 MG tablet Take 400 mg by mouth every 6 (six) hours as needed for headache or moderate pain.    .Marland Kitchenlidocaine-prilocaine (EMLA) cream Apply to affected area once (Patient not taking: Reported on 03/25/2020) 30 g 3  . loratadine (CLARITIN) 10 MG tablet Take 1 tablet (10 mg total) by mouth daily. 60 tablet 6  . methocarbamol (ROBAXIN) 750 MG tablet Take 1 tablet (750 mg total) by mouth 4 (four) times daily as needed (use for muscle cramps/pain). 30 tablet 2  . omeprazole (PRILOSEC) 40 MG  capsule Take 1 capsule (40 mg total) by mouth daily. 30 capsule 0  . prochlorperazine (COMPAZINE) 10 MG tablet Take 1 tablet (10 mg total) by mouth every 6 (six) hours as needed (Nausea or vomiting). 30 tablet 1  . valACYclovir (VALTREX) 1000 MG tablet Take 1 tablet (1,000 mg total) by mouth daily. Take days 1 through 7 of each chemotherapy cycle 30 tablet 0   No current facility-administered medications for this visit.    OBJECTIVE: Spanish speaker who appears well  There were no vitals filed for this visit.   There is no height or weight on file to calculate BMI.   Wt Readings from Last 3 Encounters:  06/01/20 157 lb 6.4 oz (71.4 kg)  05/11/20 158 lb (71.7 kg)  04/20/20 154 lb 12.8 oz (70.2 kg)      ECOG FS:1 - Symptomatic but completely ambulatory GENERAL: Patient is a well appearing female in no acute distress HEENT:  Sclerae anicteric.  Oropharynx clear and moist. No ulcerations or evidence of oropharyngeal candidiasis. Neck is supple.  NODES:  No cervical, supraclavicular, or axillary lymphadenopathy palpated.  BREAST EXAM:  Deferred. LUNGS:  Clear to auscultation bilaterally.  No wheezes or  rhonchi. HEART:  Regular rate and rhythm. No murmur appreciated. ABDOMEN:  Soft, nontender.  Positive, normoactive bowel sounds. No organomegaly palpated. MSK:  No focal spinal tenderness to palpation. Full range of motion bilaterally in the upper extremities. EXTREMITIES:  No peripheral edema.   SKIN:  Clear with no obvious rashes or skin changes. No nail dyscrasia. NEURO:  Nonfocal. Well oriented.  Appropriate affect.    LAB RESULTS:  CMP     Component Value Date/Time   NA 142 06/01/2020 0755   NA 138 12/15/2019 1543   K 4.4 06/01/2020 0755   CL 108 06/01/2020 0755   CO2 26 06/01/2020 0755   GLUCOSE 93 06/01/2020 0755   BUN 9 06/01/2020 0755   BUN 10 12/15/2019 1543   CREATININE 0.71 06/01/2020 0755   CREATININE 0.77 02/22/2020 1353   CALCIUM 9.3 06/01/2020 0755   PROT 6.5  06/01/2020 0755   PROT 7.3 12/15/2019 1543   ALBUMIN 3.7 06/01/2020 0755   ALBUMIN 4.5 12/15/2019 1543   AST 23 06/01/2020 0755   AST 13 (L) 02/22/2020 1353   ALT 21 06/01/2020 0755   ALT 8 02/22/2020 1353   ALKPHOS 73 06/01/2020 0755   BILITOT 0.3 06/01/2020 0755   BILITOT 0.3 02/22/2020 1353   GFRNONAA >60 06/01/2020 0755   GFRNONAA >60 02/22/2020 1353   GFRAA >60 03/28/2020 0813   GFRAA >60 02/22/2020 1353    No results found for: TOTALPROTELP, ALBUMINELP, A1GS, A2GS, BETS, BETA2SER, GAMS, MSPIKE, SPEI  Lab Results  Component Value Date   WBC 3.7 (L) 06/01/2020   NEUTROABS 1.8 06/01/2020   HGB 11.9 (L) 06/01/2020   HCT 34.9 (L) 06/01/2020   MCV 94.6 06/01/2020   PLT 382 06/01/2020    No results found for: LABCA2  No components found for: EPPIRJ188  No results for input(s): INR in the last 168 hours.  No results found for: LABCA2  No results found for: CZY606  No results found for: TKZ601  No results found for: UXN235  No results found for: CA2729  No components found for: HGQUANT  No results found for: CEA1 / No results found for: CEA1   No results found for: AFPTUMOR  No results found for: CHROMOGRNA  No results found for: KPAFRELGTCHN, LAMBDASER, KAPLAMBRATIO (kappa/lambda light chains)  Lab Results  Component Value Date   HGBA 97.0 10/10/2018   (Hemoglobinopathy evaluation)   No results found for: LDH  No results found for: IRON, TIBC, IRONPCTSAT (Iron and TIBC)  No results found for: FERRITIN  Urinalysis    Component Value Date/Time   COLORURINE YELLOW 12/31/2017 2039   APPEARANCEUR CLOUDY (A) 12/31/2017 2039   LABSPEC 1.015 12/31/2017 2039   PHURINE 5.0 12/31/2017 2039   GLUCOSEU NEGATIVE 12/31/2017 2039   HGBUR MODERATE (A) 12/31/2017 2039   BILIRUBINUR NEGATIVE 12/31/2017 2039   [place] NEGATIVE 12/31/2017 2039   PROTEINUR NEGATIVE 12/31/2017 2039   NITRITE NEGATIVE 12/31/2017 2039   LEUKOCYTESUR LARGE (A) 12/31/2017 2039      STUDIES: No results found.   ELIGIBLE FOR AVAILABLE RESEARCH PROTOCOL: AET  ASSESSMENT: 44 y.o. [place] woman status post right breast upper outer quadrant biopsy 02/04/2020 for a clinical T2 N0, stage IB invasive ductal carcinoma, grade 2, triple positive, with an MIB-1 of 20%  (1) status post right mastectomy and sentinel lymph node sampling 03/07/2020 for a pT2 pN1, stage IB invasive ductal carcinoma, grade 2, with negative margins.  (a) a total of 4 lymph nodes were removed, one positive  (2)  adjuvant chemo immunotherapy will consist of carboplatin, docetaxel, trastuzumab and pertuzumab every 21 days x 6 starting 03/29/2020  (a) prtuzumab discontinued after cycle 2 secondary to diarrhea  (3) trastuzumab to continue to complete a year  (a) echo 03/18/2020 shows an ejection fraction in the 60-65% range  (4) adjuvant radiation to follow  (5) genetics testing 03/02/2020 through the [facility] Common Hereditary Cancers Panel. . The Common Hereditary Cancers Panel found no deleterious mutations in APC, ATM, AXIN2, BARD1, BMPR1A, BRCA1, BRCA2, BRIP1, CDH1, CDK4, CDKN2A (p14ARF), CDKN2A (p16INK4a), CHEK2, CTNNA1, DICER1, EPCAM (Deletion/duplication testing only), GREM1 (promoter region deletion/duplication testing only), KIT, MEN1, MLH1, MSH2, MSH3, MSH6, MUTYH, NBN, NF1, NHTL1, PALB2, PDGFRA, PMS2, POLD1, POLE, PTEN, RAD50, RAD51C, RAD51D, RNF43, SDHB, SDHC, SDHD, SMAD4, SMARCA4. STK11, TP53, TSC1, TSC2, and VHL.  The following genes were evaluated for sequence changes only: SDHA and HOXB13 c.251G>A variant only.   (a)  a Variant of uncertain significance (VUS) in DICER1 at c.3380T>G (p.Ile1127Ser) was noted  (6) tamoxifen started 02/12/2020 in anticipation of possible surgical delays, held during chemotherapy   PLAN: Alannis did mu  She has all her appointments through her final chemotherapy ointment and I will see her on that particular day  Incidentally the letter I wrote to see  if her father could come visit her was helpful and he has been granted a temporary visa.  Total encounter time 25 minutes.Wilber Bihari, NP 06/20/20 10:27 PM Medical Oncology and Hematology [facility] [place], [place] [address] Tel. [phone]    Fax. [phone]   *Total Encounter Time as defined by the Centers for Medicare and Medicaid Services includes, in addition to the face-to-face time of a patient visit (documented in the note above) non-face-to-face time: obtaining and reviewing outside history, ordering and reviewing medications, tests or procedures, care coordination (communications with other health care professionals or caregivers) and documentation in the medical record.

## 2020-06-21 ENCOUNTER — Other Ambulatory Visit: Payer: Self-pay

## 2020-06-21 ENCOUNTER — Inpatient Hospital Stay: Payer: No Typology Code available for payment source

## 2020-06-21 ENCOUNTER — Inpatient Hospital Stay (HOSPITAL_BASED_OUTPATIENT_CLINIC_OR_DEPARTMENT_OTHER): Payer: Self-pay | Admitting: Adult Health

## 2020-06-21 ENCOUNTER — Encounter: Payer: Self-pay | Admitting: Adult Health

## 2020-06-21 ENCOUNTER — Encounter: Payer: Self-pay | Admitting: *Deleted

## 2020-06-21 VITALS — BP 112/51 | HR 91 | Temp 98.3°F | Resp 20 | Ht <= 58 in | Wt 161.6 lb

## 2020-06-21 DIAGNOSIS — Z95828 Presence of other vascular implants and grafts: Secondary | ICD-10-CM

## 2020-06-21 DIAGNOSIS — Z170 Estrogen receptor positive status [ER+]: Secondary | ICD-10-CM

## 2020-06-21 DIAGNOSIS — C50411 Malignant neoplasm of upper-outer quadrant of right female breast: Secondary | ICD-10-CM

## 2020-06-21 LAB — CBC WITH DIFFERENTIAL/PLATELET
Abs Immature Granulocytes: 0.01 10*3/uL (ref 0.00–0.07)
Basophils Absolute: 0 10*3/uL (ref 0.0–0.1)
Basophils Relative: 1 %
Eosinophils Absolute: 0 10*3/uL (ref 0.0–0.5)
Eosinophils Relative: 1 %
HCT: 34.5 % — ABNORMAL LOW (ref 36.0–46.0)
Hemoglobin: 11.6 g/dL — ABNORMAL LOW (ref 12.0–15.0)
Immature Granulocytes: 0 %
Lymphocytes Relative: 36 %
Lymphs Abs: 1.2 10*3/uL (ref 0.7–4.0)
MCH: 33.5 pg (ref 26.0–34.0)
MCHC: 33.6 g/dL (ref 30.0–36.0)
MCV: 99.7 fL (ref 80.0–100.0)
Monocytes Absolute: 0.4 10*3/uL (ref 0.1–1.0)
Monocytes Relative: 11 %
Neutro Abs: 1.7 10*3/uL (ref 1.7–7.7)
Neutrophils Relative %: 51 %
Platelets: 282 10*3/uL (ref 150–400)
RBC: 3.46 MIL/uL — ABNORMAL LOW (ref 3.87–5.11)
RDW: 16 % — ABNORMAL HIGH (ref 11.5–15.5)
WBC: 3.3 10*3/uL — ABNORMAL LOW (ref 4.0–10.5)
nRBC: 0 % (ref 0.0–0.2)

## 2020-06-21 LAB — COMPREHENSIVE METABOLIC PANEL
ALT: 48 U/L — ABNORMAL HIGH (ref 0–44)
AST: 40 U/L (ref 15–41)
Albumin: 3.6 g/dL (ref 3.5–5.0)
Alkaline Phosphatase: 64 U/L (ref 38–126)
Anion gap: 6 (ref 5–15)
BUN: 8 mg/dL (ref 6–20)
CO2: 26 mmol/L (ref 22–32)
Calcium: 9.1 mg/dL (ref 8.9–10.3)
Chloride: 109 mmol/L (ref 98–111)
Creatinine, Ser: 0.63 mg/dL (ref 0.44–1.00)
GFR, Estimated: >60 mL/min (ref >60)
Glucose, Bld: 97 mg/dL (ref 70–99)
Potassium: 4 mmol/L (ref 3.5–5.1)
Sodium: 141 mmol/L (ref 135–145)
Total Bilirubin: 0.3 mg/dL (ref 0.3–1.2)
Total Protein: 6.6 g/dL (ref 6.5–8.1)

## 2020-06-21 MED ORDER — PALONOSETRON HCL INJECTION 0.25 MG/5ML
INTRAVENOUS | Status: AC
  Filled 2020-06-21: qty 5

## 2020-06-21 MED ORDER — SODIUM CHLORIDE 0.9 % IV SOLN
75.0000 mg/m2 | Freq: Once | INTRAVENOUS | Status: AC
  Administered 2020-06-21: 11:00:00 130 mg via INTRAVENOUS
  Filled 2020-06-21: qty 13

## 2020-06-21 MED ORDER — DIPHENHYDRAMINE HCL 25 MG PO CAPS
25.0000 mg | ORAL_CAPSULE | Freq: Once | ORAL | Status: AC
  Administered 2020-06-21: 10:00:00 25 mg via ORAL

## 2020-06-21 MED ORDER — PALONOSETRON HCL INJECTION 0.25 MG/5ML
0.2500 mg | Freq: Once | INTRAVENOUS | Status: AC
  Administered 2020-06-21: 10:00:00 0.25 mg via INTRAVENOUS

## 2020-06-21 MED ORDER — SODIUM CHLORIDE 0.9 % IV SOLN
10.0000 mg | Freq: Once | INTRAVENOUS | Status: AC
  Administered 2020-06-21: 10:00:00 10 mg via INTRAVENOUS
  Filled 2020-06-21: qty 10

## 2020-06-21 MED ORDER — DIPHENHYDRAMINE HCL 25 MG PO CAPS
ORAL_CAPSULE | ORAL | Status: AC
  Filled 2020-06-21: qty 1

## 2020-06-21 MED ORDER — SODIUM CHLORIDE 0.9% FLUSH
10.0000 mL | Freq: Once | INTRAVENOUS | Status: AC
  Administered 2020-06-21: 08:00:00 10 mL
  Filled 2020-06-21: qty 10

## 2020-06-21 MED ORDER — TRASTUZUMAB-ANNS CHEMO 150 MG IV SOLR
6.0000 mg/kg | Freq: Once | INTRAVENOUS | Status: AC
  Administered 2020-06-21: 420 mg via INTRAVENOUS
  Filled 2020-06-21: qty 20

## 2020-06-21 MED ORDER — SODIUM CHLORIDE 0.9 % IV SOLN
Freq: Once | INTRAVENOUS | Status: AC
  Filled 2020-06-21: qty 250

## 2020-06-21 MED ORDER — SODIUM CHLORIDE 0.9% FLUSH
10.0000 mL | INTRAVENOUS | Status: DC | PRN
  Administered 2020-06-21: 13:00:00 10 mL
  Filled 2020-06-21: qty 10

## 2020-06-21 MED ORDER — SODIUM CHLORIDE 0.9 % IV SOLN
618.0000 mg | Freq: Once | INTRAVENOUS | Status: AC
  Administered 2020-06-21: 13:00:00 620 mg via INTRAVENOUS
  Filled 2020-06-21: qty 62

## 2020-06-21 MED ORDER — HEPARIN SOD (PORK) LOCK FLUSH 100 UNIT/ML IV SOLN
500.0000 [IU] | Freq: Once | INTRAVENOUS | Status: AC | PRN
  Administered 2020-06-21: 13:00:00 500 [IU]
  Filled 2020-06-21: qty 5

## 2020-06-21 MED ORDER — ACETAMINOPHEN 325 MG PO TABS
ORAL_TABLET | ORAL | Status: AC
  Filled 2020-06-21: qty 2

## 2020-06-21 MED ORDER — ACETAMINOPHEN 325 MG PO TABS
650.0000 mg | ORAL_TABLET | Freq: Once | ORAL | Status: AC
  Administered 2020-06-21: 10:00:00 650 mg via ORAL

## 2020-06-21 MED ORDER — SODIUM CHLORIDE 0.9 % IV SOLN
150.0000 mg | Freq: Once | INTRAVENOUS | Status: AC
  Administered 2020-06-21: 10:00:00 150 mg via INTRAVENOUS
  Filled 2020-06-21: qty 150

## 2020-06-21 NOTE — Patient Instructions (Signed)

## 2020-06-21 NOTE — Progress Notes (Signed)
[place]  [phone]) [phone] [phone]     ID: Jill [name] DOB: 09/11/1975  MR#: [id number]  SJG#:[id number]  Patient Care Team: Gildardo Pounds, NP as PCP - General (Nurse Practitioner) Magrinat, Virgie Dad, MD as Consulting Physician (Oncology) Jovita Kussmaul, MD as Consulting Physician (General Surgery) Dillingham, Loel Lofty, DO as Attending Physician (Plastic Surgery) Mauro Kaufmann, RN as Oncology Nurse Navigator Rockwell Germany, RN as Oncology Nurse Navigator Scot Dock, NP OTHER MD:  CHIEF COMPLAINT: triple positive breast cancer (s/p right mastectomy)  CURRENT TREATMENT: Adjuvant chemotherapy   INTERVAL HISTORY: Jill [name] returns today for follow up and treatment of her triple positive breast cancer.  She is accompanied by an interpreter through [employer] interpreter line.  Jill [name] began adjuvant chemotherapy, consisting of carboplatin, docetaxel, trastuzumab and pertuzumab every 21 days x6, on 03/29/2020. Pertuzumab was omitted after cycle 3 due to diarrhea.  She is due for cycle 4 day 1 today.  She says she is feeling well.    Her most recent echocardiogram on 03/18/2020 showed an ejection fraction of 60-65%.  She is scheduled for repeat echocardiography 06/29/2020  REVIEW OF SYSTEMS: Jill [name] is doing well today.  She says her only problem is pain in her feet.  Her skin is dry.  She denies any peripheral neuropathy in her fingertips or toes.  She does note mild nail bed changes.  She notes some pain in her lower back and wonders if it is her kidneys.    She is otherwise without any new issues.  She denies any mucositis, fever, chills, cough, nausea, vomiting, bowel/bladder changes, headaches, vision changes, or any other concerns.  A detailed ROS was otherwise non contributory.      COVID 19 VACCINATION STATUS: fully vaccinated [employer]), no booster as of December 2021   HISTORY OF CURRENT ILLNESS: From the original intake  note:  "Jill [name]" herself palpated a right breast abnormality sometime in 2019.,  She did not pay much mind.  This was noted again during her pregnancy and she underwent bilateral diagnostic mammography with tomography and right breast ultrasonography at [facility] on 01/21/2020 showing: breast density category C; 4.1 cm irregular mass with associated group of pleomorphic calcifications measuring approximately 4.9 cm, located at site of palpable concern in upper-outer right breast at 9:30; no right axillary lymphadenopathy.  Accordingly on 02/04/2020 she proceeded to biopsy of the right breast area in question. The pathology from this procedure ([id number]) showed: invasive and in situ mammary carcinoma, e-cadherin positive, grade 2. Prognostic indicators significant for: estrogen receptor, 100% positive and progesterone receptor, 100% positive, both with strong staining intensity. Proliferation marker Ki67 at 20%. HER2 equivocal by immunohistochemistry (2+), but positive by fluorescent in situ hybridization with a signals ratio 5.39 and number per cell 11.05.  The patient's subsequent history is as detailed below.   PAST MEDICAL HISTORY: Past Medical History:  Diagnosis Date  . Infected cyst of Bartholin's gland duct   . Medical history non-contributory     PAST SURGICAL HISTORY: Past Surgical History:  Procedure Laterality Date  . IR IMAGING GUIDED PORT INSERTION  03/02/2020  . MASTECTOMY W/ SENTINEL NODE BIOPSY Right 03/07/2020   Procedure: RIGHT MASTECTOMY WITH SENTINEL LYMPH NODE BIOPSY;  Surgeon: Jovita Kussmaul, MD;  Location: [place];  Service: General;  Laterality: Right;  . NO PAST SURGERIES    . WISDOM TOOTH EXTRACTION      FAMILY HISTORY: Family History  Problem Relation  Age of Onset  . Diabetes Maternal Grandmother   . Hypertension Mother   The patient's father is 17 and her mother 26 as of August 2021.  The patient is 3 brothers and 2 sisters.  There is  no history of cancer in the family to her knowledge   GYNECOLOGIC HISTORY:  No LMP recorded. (Menstrual status: IUD). Menarche: 44 years old Age at first live birth: 44 years old [place] P 4 LMP irregular Contraceptive: Mirena IUD in place HRT n/a  Hysterectomy? no BSO? no   SOCIAL HISTORY: (updated 01/2020)  Arian sometimes works for a [employer] but she is currently not working, taking care of the children and the household.  Her significant other [name] works Occupational hygienist.  They are both from [place].  The patient's children are ages 58, 54, 59, and 82 months as of August 2021.  The older 3 children are from a different father and their last name is [name].  (The patient was not married to Mr. [name] so there is no legal issue regarding access to medical records, etc.). The 25-monthold is a child of Jill [name] and MPlant [name]  The patient [place] a [place] Not in place   HEALTH MAINTENANCE: Social History   Tobacco Use  . Smoking status: Never Smoker  . Smokeless tobacco: Never Used  Vaping Use  . Vaping Use: Never used  Substance Use Topics  . Alcohol use: Never  . Drug use: Never     Colonoscopy: n/a (age)  PAP: 10/2018, negative  Bone density: n/a (age)   No Known Allergies  Current Outpatient Medications  Medication Sig Dispense Refill  . acetaminophen (TYLENOL) 500 MG tablet Take 500 mg by mouth every 6 (six) hours as needed for moderate pain or headache.    . calcium carbonate (TUMS) 500 MG chewable tablet Chew 1 tablet (200 mg of elemental calcium total) by mouth 3 (three) times daily as needed for indigestion or heartburn. 90 tablet 0  . dexamethasone (DECADRON) 4 MG tablet Take 2 tablets (8 mg total) by mouth 2 (two) times daily. Take decadron  Twice daily the day before taxotere then daily x 3 days after carboplatin  chemo 30 tablet 1  . HYDROcodone-acetaminophen (NORCO/VICODIN) 5-325 MG tablet Take 1-2 tablets by  mouth every 6 (six) hours as needed for moderate pain or severe pain. 15 tablet 0  . ibuprofen (ADVIL) 200 MG tablet Take 400 mg by mouth every 6 (six) hours as needed for headache or moderate pain.    .Marland Kitchenlidocaine-prilocaine (EMLA) cream Apply to affected area once (Patient not taking: Reported on 03/25/2020) 30 g 3  . loratadine (CLARITIN) 10 MG tablet Take 1 tablet (10 mg total) by mouth daily. 60 tablet 6  . methocarbamol (ROBAXIN) 750 MG tablet Take 1 tablet (750 mg total) by mouth 4 (four) times daily as needed (use for muscle cramps/pain). 30 tablet 2  . omeprazole (PRILOSEC) 40 MG capsule Take 1 capsule (40 mg total) by mouth daily. 30 capsule 0  . prochlorperazine (COMPAZINE) 10 MG tablet Take 1 tablet (10 mg total) by mouth every 6 (six) hours as needed (Nausea or vomiting). 30 tablet 1  . valACYclovir (VALTREX) 1000 MG tablet Take 1 tablet (1,000 mg total) by mouth daily. Take days 1 through 7 of each chemotherapy cycle 30 tablet 0   No current facility-administered medications for this visit.    OBJECTIVE: Spanish speaker who appears well  Vitals:  06/21/20 0824  BP: (!) 112/51  Pulse: 91  Resp: 20  Temp: 98.3 F (36.8 C)  SpO2: 100%     Body mass index is 33.77 kg/m.   Wt Readings from Last 3 Encounters:  06/21/20 161 lb 9.6 oz (73.3 kg)  06/01/20 157 lb 6.4 oz (71.4 kg)  05/11/20 158 lb (71.7 kg)      ECOG FS:1 - Symptomatic but completely ambulatory GENERAL: Patient is a well appearing female in no acute distress HEENT:  Sclerae anicteric.  Oropharynx clear and moist. No ulcerations or evidence of oropharyngeal candidiasis. Neck is supple.  NODES:  No cervical, supraclavicular, or axillary lymphadenopathy palpated.  BREAST EXAM:  Deferred. LUNGS:  Clear to auscultation bilaterally.  No wheezes or rhonchi. HEART:  Regular rate and rhythm. No murmur appreciated. ABDOMEN:  Soft, nontender.  Positive, normoactive bowel sounds. No organomegaly palpated. MSK:  No focal  spinal tenderness to palpation. Full range of motion bilaterally in the upper extremities. EXTREMITIES:  No peripheral edema.   SKIN:  Mild dry skin noted on bilateral soles of feet, no skin peeling, erythema, swelling or tenderness.  Darkness noted at base of nail beds on fingernails.  Marland Kitchen NEURO:  Nonfocal. Well oriented.  Appropriate affect.    LAB RESULTS:  CMP     Component Value Date/Time   NA 141 06/21/2020 0804   NA 138 12/15/2019 1543   K 4.0 06/21/2020 0804   CL 109 06/21/2020 0804   CO2 26 06/21/2020 0804   GLUCOSE 97 06/21/2020 0804   BUN 8 06/21/2020 0804   BUN 10 12/15/2019 1543   CREATININE 0.63 06/21/2020 0804   CREATININE 0.77 02/22/2020 1353   CALCIUM 9.1 06/21/2020 0804   PROT 6.6 06/21/2020 0804   PROT 7.3 12/15/2019 1543   ALBUMIN 3.6 06/21/2020 0804   ALBUMIN 4.5 12/15/2019 1543   AST 40 06/21/2020 0804   AST 13 (L) 02/22/2020 1353   ALT 48 (H) 06/21/2020 0804   ALT 8 02/22/2020 1353   ALKPHOS 64 06/21/2020 0804   BILITOT 0.3 06/21/2020 0804   BILITOT 0.3 02/22/2020 1353   GFRNONAA >60 06/21/2020 0804   GFRNONAA >60 02/22/2020 1353   GFRAA >60 03/28/2020 0813   GFRAA >60 02/22/2020 1353    No results found for: TOTALPROTELP, ALBUMINELP, A1GS, A2GS, BETS, BETA2SER, GAMS, MSPIKE, SPEI  Lab Results  Component Value Date   WBC 3.3 (L) 06/21/2020   NEUTROABS 1.7 06/21/2020   HGB 11.6 (L) 06/21/2020   HCT 34.5 (L) 06/21/2020   MCV 99.7 06/21/2020   PLT 282 06/21/2020    No results found for: LABCA2  No components found for: GBTDVV616  No results for input(s): INR in the last 168 hours.  No results found for: LABCA2  No results found for: WVP710  No results found for: GYI948  No results found for: NIO270  No results found for: CA2729  No components found for: HGQUANT  No results found for: CEA1 / No results found for: CEA1   No results found for: AFPTUMOR  No results found for: CHROMOGRNA  No results found for: KPAFRELGTCHN,  LAMBDASER, KAPLAMBRATIO (kappa/lambda light chains)  Lab Results  Component Value Date   HGBA 97.0 10/10/2018   (Hemoglobinopathy evaluation)   No results found for: LDH  No results found for: IRON, TIBC, IRONPCTSAT (Iron and TIBC)  No results found for: FERRITIN  Urinalysis    Component Value Date/Time   COLORURINE YELLOW 12/31/2017 2039   APPEARANCEUR CLOUDY (A) 12/31/2017 2039  LABSPEC 1.015 12/31/2017 2039   PHURINE 5.0 12/31/2017 2039   GLUCOSEU NEGATIVE 12/31/2017 2039   HGBUR MODERATE (A) 12/31/2017 2039   BILIRUBINUR NEGATIVE 12/31/2017 2039   [place] NEGATIVE 12/31/2017 2039   PROTEINUR NEGATIVE 12/31/2017 2039   NITRITE NEGATIVE 12/31/2017 2039   LEUKOCYTESUR LARGE (A) 12/31/2017 2039     STUDIES: No results found.   ELIGIBLE FOR AVAILABLE RESEARCH PROTOCOL: AET  ASSESSMENT: 44 y.o. [place] woman status post right breast upper outer quadrant biopsy 02/04/2020 for a clinical T2 N0, stage IB invasive ductal carcinoma, grade 2, triple positive, with an MIB-1 of 20%  (1) status post right mastectomy and sentinel lymph node sampling 03/07/2020 for a pT2 pN1, stage IB invasive ductal carcinoma, grade 2, with negative margins.  (a) a total of 4 lymph nodes were removed, one positive  (2) adjuvant chemo immunotherapy will consist of carboplatin, docetaxel, trastuzumab and pertuzumab every 21 days x 6 starting 03/29/2020  (a) prtuzumab discontinued after cycle 2 secondary to diarrhea  (3) trastuzumab to continue to complete a year  (a) echo 03/18/2020 shows an ejection fraction in the 60-65% range  (4) adjuvant radiation to follow  (5) genetics testing 03/02/2020 through the [facility] Common Hereditary Cancers Panel. . The Common Hereditary Cancers Panel found no deleterious mutations in APC, ATM, AXIN2, BARD1, BMPR1A, BRCA1, BRCA2, BRIP1, CDH1, CDK4, CDKN2A (p14ARF), CDKN2A (p16INK4a), CHEK2, CTNNA1, DICER1, EPCAM (Deletion/duplication testing only), GREM1  (promoter region deletion/duplication testing only), KIT, MEN1, MLH1, MSH2, MSH3, MSH6, MUTYH, NBN, NF1, NHTL1, PALB2, PDGFRA, PMS2, POLD1, POLE, PTEN, RAD50, RAD51C, RAD51D, RNF43, SDHB, SDHC, SDHD, SMAD4, SMARCA4. STK11, TP53, TSC1, TSC2, and VHL.  The following genes were evaluated for sequence changes only: SDHA and HOXB13 c.251G>A variant only.   (a)  a Variant of uncertain significance (VUS) in [place] at c.3380T>G (p.Ile1127Ser) was noted  (6) tamoxifen started 02/12/2020 in anticipation of possible surgical delays, held during chemotherapy   PLAN: Tiffnay is doing well today.  She continues on treatment with Docetaxel, carboplatin, and Trastuzumab with good tolerance.  She will continue this.  We are monitoring her closely for peripheral neuropathy which she has not developed at this point.    She will apply lotion to her feet bilaterally.  She and I reviewed her labs today which remain stable.    We reviewed healthy diet and activity level during chemotherapy.  She will return on 1/24 (adjusted per her preference) for labs, f/u, and her next treatment.  She knows to call for any questions that may arise between now and her next appointment.  We are happy to see her sooner if needed.  Total encounter time 30 minutes.Wilber Bihari, NP 06/21/20 9:02 AM Medical Oncology and Hematology [facility] [place], [place] [address] Tel. [phone]    Fax. [phone]   *Total Encounter Time as defined by the Centers for Medicare and Medicaid Services includes, in addition to the face-to-face time of a patient visit (documented in the note above) non-face-to-face time: obtaining and reviewing outside history, ordering and reviewing medications, tests or procedures, care coordination (communications with other health care professionals or caregivers) and documentation in the medical record.

## 2020-06-21 NOTE — Patient Instructions (Signed)
[facility] Discharge Instructions for Patients Receiving Chemotherapy  Today you received the following chemotherapy agents: trastuzumab, taxotere, carboplatin  To help prevent nausea and vomiting after your treatment, we encourage you to take your nausea medication as directed.   If you develop nausea and vomiting that is not controlled by your nausea medication, call the clinic.   BELOW ARE SYMPTOMS THAT SHOULD BE REPORTED IMMEDIATELY:  *FEVER GREATER THAN 100.5 F  *CHILLS WITH OR WITHOUT FEVER  NAUSEA AND VOMITING THAT IS NOT CONTROLLED WITH YOUR NAUSEA MEDICATION  *UNUSUAL SHORTNESS OF BREATH  *UNUSUAL BRUISING OR BLEEDING  TENDERNESS IN MOUTH AND THROAT WITH OR WITHOUT PRESENCE OF ULCERS  *URINARY PROBLEMS  *BOWEL PROBLEMS  UNUSUAL RASH Items with * indicate a potential emergency and should be followed up as soon as possible.  Feel free to call the clinic should you have any questions or concerns. The clinic phone number is [phone].  Please show the CHEMO ALERT CARD at check-in to the Emergency Department and triage nurse.   

## 2020-06-22 ENCOUNTER — Other Ambulatory Visit: Payer: No Typology Code available for payment source

## 2020-06-22 ENCOUNTER — Ambulatory Visit: Payer: No Typology Code available for payment source

## 2020-06-23 ENCOUNTER — Inpatient Hospital Stay: Payer: No Typology Code available for payment source

## 2020-06-23 ENCOUNTER — Ambulatory Visit: Payer: No Typology Code available for payment source

## 2020-06-23 ENCOUNTER — Other Ambulatory Visit: Payer: Self-pay

## 2020-06-23 VITALS — BP 115/76 | HR 82 | Temp 98.2°F | Resp 16

## 2020-06-23 DIAGNOSIS — Z170 Estrogen receptor positive status [ER+]: Secondary | ICD-10-CM

## 2020-06-23 MED ORDER — PEGFILGRASTIM-CBQV 6 MG/0.6ML ~~LOC~~ SOSY
6.0000 mg | PREFILLED_SYRINGE | Freq: Once | SUBCUTANEOUS | Status: AC
  Administered 2020-06-23: 6 mg via SUBCUTANEOUS

## 2020-06-23 NOTE — Patient Instructions (Signed)

## 2020-06-28 NOTE — Progress Notes (Signed)
CARDIO-ONCOLOGY CLINIC CONSULT NOTE  Referring Physician: Dr. Chrissie Noa Primary Care: Claiborne Rigg, NP    HPI:  Jill [name] is 45 y[place] with right breast cancer referred by Dr. Darnelle Catalan for enrollment into the Cardio-Oncology program.  Oncology history:  (1) Diagnosed R breast CA 8/21. Status post right mastectomy and sentinel lymph node sampling 03/07/2020 for a pT2 pN1, stage IB invasive ductal carcinoma, grade 2, with negative margins.             (a) a total of 4 lymph nodes were removed, one positive  (2) adjuvant chemo immunotherapy with carboplatin, docetaxel, trastuzumab and pertuzumab every 21 days x 6 starting 03/29/2020             (a) pertuzumab discontinued after cycle 2 secondary to diarrhea  (3) trastuzumab to continue to complete a year             (a) echo 03/18/2020 shows an ejection fraction in the 60-65% range  (4) adjuvant radiation to follow  (5) tamoxifen started 02/12/2020 in anticipation of possible surgical delays, held during chemotherapy   ECHO 9/21: EF 60-65% ECHO today 06/30/19 EF 65-70% Grade I DD. Reviewed personally.   Patient seen with interpreter on phone. Denies any known cardiac history or CRFs. No CP, SOB or edema.    Review of Systems: [y] = yes, [ ]  = no   General: Weight gain [ ] ; Weight loss [ ] ; Anorexia [ ] ; Fatigue [ y]; Fever [ ] ; Chills [ ] ; Weakness [ ]   Cardiac: Chest pain/pressure [ ] ; Resting SOB [ ] ; Exertional SOB [ ] ; Orthopnea [ ] ; Pedal Edema [ ] ; Palpitations [ ] ; Syncope [ ] ; Presyncope [ ] ; Paroxysmal nocturnal dyspnea[ ]   Pulmonary: Cough [ ] ; Wheezing[ ] ; Hemoptysis[ ] ; Sputum [ ] ; Snoring [ ]   GI: Vomiting[ ] ; Dysphagia[ ] ; Melena[ ] ; Hematochezia [ ] ; Heartburn[ ] ; Abdominal pain [ ] ; Constipation [ ] ; Diarrhea [ ] ; BRBPR [ ]   GU: Hematuria[ ] ; Dysuria [ ] ; Nocturia[ ]   Vascular: Pain in legs with walking [ ] ; Pain in feet with lying flat [ ] ; Non-healing sores [ ] ; Stroke [ ] ; TIA [ ] ;  Slurred speech [ ] ;  Neuro: Headaches[ ] ; Vertigo[ ] ; Seizures[ ] ; Paresthesias[ ] ;Blurred vision [ ] ; Diplopia [ ] ; Vision changes [ ]   Ortho/Skin: Arthritis [ ] ; Joint pain [ ] ; Muscle pain [ ] ; Joint swelling [ ] ; Back Pain [ ] ; Rash [ ]   Psych: Depression[ ] ; Anxiety[ ]   Heme: Bleeding problems [ ] ; Clotting disorders [ ] ; Anemia [ ]   Endocrine: Diabetes [ ] ; Thyroid dysfunction[ ]    Past Medical History:  Diagnosis Date   Infected cyst of Bartholin's gland duct    Medical history non-contributory     Current Outpatient Medications  Medication Sig Dispense Refill   acetaminophen (TYLENOL) 500 MG tablet Take 500 mg by mouth every 6 (six) hours as needed for moderate pain or headache.     calcium carbonate (TUMS) 500 MG chewable tablet Chew 1 tablet (200 mg of elemental calcium total) by mouth 3 (three) times daily as needed for indigestion or heartburn. 90 tablet 0   dexamethasone (DECADRON) 4 MG tablet Take 2 tablets (8 mg total) by mouth 2 (two) times daily. Take decadron  Twice daily the day before taxotere then daily x 3 days after carboplatin  chemo 30 tablet 1   HYDROcodone-acetaminophen (NORCO/VICODIN) 5-325 MG tablet Take 1-2 tablets by mouth every 6 (six)  hours as needed for moderate pain or severe pain. 15 tablet 0   ibuprofen (ADVIL) 200 MG tablet Take 400 mg by mouth every 6 (six) hours as needed for headache or moderate pain.     lidocaine-prilocaine (EMLA) cream Apply to affected area once 30 g 3   loratadine (CLARITIN) 10 MG tablet Take 1 tablet (10 mg total) by mouth daily. 60 tablet 6   methocarbamol (ROBAXIN) 750 MG tablet Take 1 tablet (750 mg total) by mouth 4 (four) times daily as needed (use for muscle cramps/pain). 30 tablet 2   omeprazole (PRILOSEC) 40 MG capsule Take 1 capsule (40 mg total) by mouth daily. 30 capsule 0   prochlorperazine (COMPAZINE) 10 MG tablet Take 1 tablet (10 mg total) by mouth every 6 (six) hours as needed (Nausea or vomiting). 30  tablet 1   valACYclovir (VALTREX) 1000 MG tablet Take 1 tablet (1,000 mg total) by mouth daily. Take days 1 through 7 of each chemotherapy cycle 30 tablet 0   No current facility-administered medications for this encounter.    No Known Allergies    Social History   Socioeconomic History   Marital status: Single    Spouse name: Not on file   Number of children: 4   Years of education: Not on file   Highest education level: 6th grade  Occupational History   Not on file  Tobacco Use   Smoking status: Never Smoker   Smokeless tobacco: Never Used  Vaping Use   Vaping Use: Never used  Substance and Sexual Activity   Alcohol use: Never   Drug use: Never   Sexual activity: Not Currently    Birth control/protection: I.U.D.  Other Topics Concern   Not on file  Social History Narrative   Not on file   Social Determinants of Health   Financial Resource Strain: Not on file  Food Insecurity: Not on file  Transportation Needs: No Transportation Needs   Lack of Transportation (Medical): No   Lack of Transportation (Non-Medical): No  Physical Activity: Not on file  Stress: Not on file  Social Connections: Not on file  Intimate Partner Violence: Not on file      Family History  Problem Relation Age of Onset   Diabetes Maternal Grandmother    Hypertension Mother     Vitals:   06/29/20 1404  BP: 98/80  Pulse: (!) 107  SpO2: 98%  Weight: 71.4 kg (157 lb 6.4 oz)    PHYSICAL EXAM: General:  Well appearing. No respiratory difficulty HEENT: normal Neck: supple. Portacath ok. Carotids 2+ bilat; no bruits. No lymphadenopathy or thryomegaly appreciated. Cor: PMI nondisplaced. Regular rate & rhythm. No rubs, gallops or murmurs. Lungs: clear Abdomen: obese soft, nontender, nondistended. No hepatosplenomegaly. No bruits or masses. Good bowel sounds. Extremities: no cyanosis, clubbing, rash, edema Neuro: alert & oriented x 3, cranial nerves grossly intact.  moves all 4 extremities w/o difficulty. Affect pleasant.    ASSESSMENT & PLAN:  1. R Breast Cancer - Explained incidence of Herceptin cardiotoxicity and role of Cardio-oncology clinic at length. Echo images reviewed personally. All parameters stable. Reviewed signs and symptoms of HF to look for. Continue Herceptin. Follow-up with echo in 3 months. - ECHO 9/21: EF 60-65% - ECHO today 06/30/19 EF 65-70% Grade I DD. Reviewed personally.  Jill Bickers, MD  12:40 PM

## 2020-06-29 ENCOUNTER — Ambulatory Visit (HOSPITAL_BASED_OUTPATIENT_CLINIC_OR_DEPARTMENT_OTHER)
Admission: RE | Admit: 2020-06-29 | Discharge: 2020-06-29 | Disposition: A | Discharge status: Home / Self Care | Payer: Self-pay | Source: Ambulatory Visit | Attending: Internal Medicine | Admitting: Internal Medicine

## 2020-06-29 ENCOUNTER — Other Ambulatory Visit: Payer: Self-pay

## 2020-06-29 ENCOUNTER — Ambulatory Visit (HOSPITAL_COMMUNITY)
Admission: RE | Admit: 2020-06-29 | Discharge: 2020-06-29 | Disposition: A | Discharge status: Home / Self Care | Payer: Self-pay | Source: Ambulatory Visit | Attending: Internal Medicine | Admitting: Internal Medicine

## 2020-06-29 ENCOUNTER — Encounter (HOSPITAL_COMMUNITY): Payer: Self-pay | Admitting: Internal Medicine

## 2020-06-29 VITALS — BP 98/80 | HR 107 | Wt 157.4 lb

## 2020-06-29 DIAGNOSIS — C50411 Malignant neoplasm of upper-outer quadrant of right female breast: Secondary | ICD-10-CM | POA: Insufficient documentation

## 2020-06-29 DIAGNOSIS — Z170 Estrogen receptor positive status [ER+]: Secondary | ICD-10-CM | POA: Insufficient documentation

## 2020-06-29 DIAGNOSIS — Z0189 Encounter for other specified special examinations: Secondary | ICD-10-CM

## 2020-06-29 DIAGNOSIS — Z9011 Acquired absence of right breast and nipple: Secondary | ICD-10-CM | POA: Insufficient documentation

## 2020-06-29 DIAGNOSIS — Z5181 Encounter for therapeutic drug level monitoring: Secondary | ICD-10-CM | POA: Insufficient documentation

## 2020-06-29 DIAGNOSIS — Z79899 Other long term (current) drug therapy: Secondary | ICD-10-CM | POA: Insufficient documentation

## 2020-06-29 NOTE — Progress Notes (Signed)
Spanish intrepreter service used [phone] Eli Hose . Reviewed allergies,medications and any compliants.Dr. Gala Romney and J.Milford NP made aware.

## 2020-06-29 NOTE — Patient Instructions (Signed)
Your physician has requested that you have an echocardiogram. Echocardiography is a painless test that uses sound waves to create images of your heart. It provides your doctor with information about the size and shape of your heart and how well your heart's chambers and valves are working. This procedure takes approximately one hour. There are no restrictions for this procedure.  Your physician recommends that you schedule a follow-up appointment in: 3 months  If you have any questions or concerns before your next appointment please send us a message through mychart or call our office at [phone].    TO LEAVE A MESSAGE FOR THE NURSE SELECT OPTION 2, PLEASE LEAVE A MESSAGE INCLUDING: . YOUR NAME . DATE OF BIRTH . CALL BACK NUMBER . REASON FOR CALL**this is important as we prioritize the call backs  YOU WILL RECEIVE A CALL BACK THE SAME DAY AS LONG AS YOU CALL BEFORE 4:00 PM  At the [facility], you and your health needs are our priority. As part of our continuing mission to provide you with exceptional heart care, we have created designated Provider Care Teams. These Care Teams include your primary Cardiologist (physician) and Advanced Practice Providers (APPs- Physician Assistants and Nurse Practitioners) who all work together to provide you with the care you need, when you need it.   You may see any of the following providers on your designated Care Team at your next follow up: . Dr Daniel Bensimhon . Dr Dalton McLean . Amy Clegg, NP . Brittainy Simmons, PA . Lauren Kemp, PharmD   Please be sure to bring in all your medications bottles to every appointment.     

## 2020-06-29 NOTE — Progress Notes (Signed)
  Echocardiogram 2D Echocardiogram has been performed.  Delcie Roch 06/29/2020, 1:52 PM

## 2020-06-30 NOTE — Addendum Note (Signed)
Encounter addended by: Dolores Patty, MD on: 06/30/2020 12:41 PM  Actions taken: Level of Service modified

## 2020-07-13 ENCOUNTER — Other Ambulatory Visit: Payer: No Typology Code available for payment source

## 2020-07-13 ENCOUNTER — Ambulatory Visit: Payer: No Typology Code available for payment source

## 2020-07-13 ENCOUNTER — Ambulatory Visit: Payer: No Typology Code available for payment source | Admitting: Oncology

## 2020-07-13 ENCOUNTER — Encounter: Payer: Self-pay | Admitting: *Deleted

## 2020-07-14 ENCOUNTER — Ambulatory Visit: Payer: No Typology Code available for payment source

## 2020-07-15 ENCOUNTER — Ambulatory Visit: Payer: No Typology Code available for payment source

## 2020-07-16 NOTE — Progress Notes (Signed)
[facility]  [phone] [phone]     ID: Jill [name] DOB: August 07, 1975  MR#: [id number]  GHW#:[id number]  Patient Care Team: Gildardo Pounds, NP as PCP - General (Nurse Practitioner) Yasmin Dibello, Virgie Dad, MD as Consulting Physician (Oncology) Jovita Kussmaul, MD as Consulting Physician (General Surgery) Dillingham, Loel Lofty, DO as Attending Physician (Plastic Surgery) Mauro Kaufmann, RN as Oncology Nurse Navigator Rockwell Germany, RN as Oncology Nurse Navigator Chauncey Cruel, MD OTHER MD:  CHIEF COMPLAINT: triple positive breast cancer (s/p right mastectomy)  CURRENT TREATMENT: Adjuvant chemotherapy   INTERVAL HISTORY: Jill [name] returns today for follow up and treatment of her triple positive breast cancer.  She is accompanied by her interpreter.  Lucynda began adjuvant chemotherapy, consisting of carboplatin, docetaxel, trastuzumab and pertuzumab every 21 days x6, on 03/29/2020. Pertuzumab was omitted after cycle 3 due to diarrhea.  She is due for cycle 6, her final chemotherapy cycle today.      Since her last visit, she underwent repeat echocardiogram on 06/29/2020 showing an ejection fraction of 65-70%.    REVIEW OF SYSTEMS: Elita continues to have pain related to her surgical site but she also has pain in other parts of her body.  She thinks her breast is a little bit swollen.  She has a dry cough at times and sometimes she feels short of breath.  Her skin is very dry.  She says the soles of her feet are darker than they should be.  She feels very tired from the chemotherapy.  A detailed review of systems was otherwise stable   COVID 19 VACCINATION STATUS: fully vaccinated [employer]), no booster as of December 2021   HISTORY OF CURRENT ILLNESS: From the original intake note:  "Jill [name]" herself palpated a right breast abnormality sometime in 2019.,  She did not pay much mind.  This was noted again during her pregnancy and she  underwent bilateral diagnostic mammography with tomography and right breast ultrasonography at [facility] on 01/21/2020 showing: breast density category C; 4.1 cm irregular mass with associated group of pleomorphic calcifications measuring approximately 4.9 cm, located at site of palpable concern in upper-outer right breast at 9:30; no right axillary lymphadenopathy.  Accordingly on 02/04/2020 she proceeded to biopsy of the right breast area in question. The pathology from this procedure ([id number]) showed: invasive and in situ mammary carcinoma, e-cadherin positive, grade 2. Prognostic indicators significant for: estrogen receptor, 100% positive and progesterone receptor, 100% positive, both with strong staining intensity. Proliferation marker Ki67 at 20%. HER2 equivocal by immunohistochemistry (2+), but positive by fluorescent in situ hybridization with a signals ratio 5.39 and number per cell 11.05.  The patient's subsequent history is as detailed below.   PAST MEDICAL HISTORY: Past Medical History:  Diagnosis Date  . Infected cyst of Bartholin's gland duct   . Medical history non-contributory     PAST SURGICAL HISTORY: Past Surgical History:  Procedure Laterality Date  . IR IMAGING GUIDED PORT INSERTION  03/02/2020  . MASTECTOMY W/ SENTINEL NODE BIOPSY Right 03/07/2020   Procedure: RIGHT MASTECTOMY WITH SENTINEL LYMPH NODE BIOPSY;  Surgeon: Jovita Kussmaul, MD;  Location: [facility];  Service: General;  Laterality: Right;  . NO PAST SURGERIES    . WISDOM TOOTH EXTRACTION      FAMILY HISTORY: Family History  Problem Relation Age of Onset  . Diabetes Maternal Grandmother   . Hypertension Mother   The patient's father is 93 and her  mother 44 as of August 2021.  The patient is 44 brothers and 2 sisters.  There is no history of cancer in the family to her knowledge   GYNECOLOGIC HISTORY:  No LMP recorded. (Menstrual status: IUD). Menarche: 45 years old Age at first  live birth: 45 years old [place] P 4 LMP irregular Contraceptive: Mirena IUD in place HRT n/a  Hysterectomy? no BSO? 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  Her significant other [name] works Occupational hygienist.  They are both from [place].  The patient's children are ages 44, 17, 92, and 22 months as of August 2021.  The older 3 children are from a different father and their last name is [name].  (The patient was not married to Mr. [name] so there is no legal issue regarding access to medical records, etc.). The 87-monthold is a child of [place] and [place]  The patient attends a local [place] Not in place   HEALTH MAINTENANCE: Social History   Tobacco Use  . Smoking status: Never Smoker  . Smokeless tobacco: Never Used  Vaping Use  . Vaping Use: Never used  Substance Use Topics  . Alcohol use: Never  . Drug use: Never     Colonoscopy: n/a (age)  PAP: 10/2018, negative  Bone density: n/a (age)   No Known Allergies  Current Outpatient Medications  Medication Sig Dispense Refill  . acetaminophen (TYLENOL) 500 MG tablet Take 500 mg by mouth every 6 (six) hours as needed for moderate pain or headache.    . calcium carbonate (TUMS) 500 MG chewable tablet Chew 1 tablet (200 mg of elemental calcium total) by mouth 3 (three) times daily as needed for indigestion or heartburn. 90 tablet 0  . dexamethasone (DECADRON) 4 MG tablet Take 2 tablets (8 mg total) by mouth 2 (two) times daily. Take decadron  Twice daily the day before taxotere then daily x 3 days after carboplatin  chemo 30 tablet 1  . HYDROcodone-acetaminophen (NORCO/VICODIN) 5-325 MG tablet Take 1-2 tablets by mouth every 6 (six) hours as needed for moderate pain or severe pain. 15 tablet 0  . ibuprofen (ADVIL) 200 MG tablet Take 400 mg by mouth every 6 (six) hours as  needed for headache or moderate pain.    .Marland Kitchenlidocaine-prilocaine (EMLA) cream Apply to affected area once 30 g 3  . loratadine (CLARITIN) 10 MG tablet Take 1 tablet (10 mg total) by mouth daily. 60 tablet 6  . methocarbamol (ROBAXIN) 750 MG tablet Take 1 tablet (750 mg total) by mouth 4 (four) times daily as needed (use for muscle cramps/pain). 30 tablet 2  . omeprazole (PRILOSEC) 40 MG capsule Take 1 capsule (40 mg total) by mouth daily. 30 capsule 0  . prochlorperazine (COMPAZINE) 10 MG tablet Take 1 tablet (10 mg total) by mouth every 6 (six) hours as needed (Nausea or vomiting). 30 tablet 1  . valACYclovir (VALTREX) 1000 MG tablet Take 1 tablet (1,000 mg total) by mouth daily. Take days 1 through 7 of each chemotherapy cycle 30 tablet 0   No current facility-administered medications for this visit.   Facility-Administered Medications Ordered in Other Visits  Medication Dose Route Frequency Provider Last Rate Last Admin  . dexamethasone (DECADRON) 10 mg in sodium chloride 0.9 % 50 mL IVPB  10 mg Intravenous Once Wai Litt, GVirgie Dad MD      .  fosaprepitant (EMEND) 150 mg in sodium chloride 0.9 % 145 mL IVPB  150 mg Intravenous Once Kalum Minner, Virgie Dad, MD      . heparin lock flush 100 unit/mL  500 Units Intracatheter Once PRN Elianis Fischbach, Virgie Dad, MD      . sodium chloride flush (NS) 0.9 % injection 10 mL  10 mL Intracatheter PRN Michiko Lineman, Virgie Dad, MD      . Theotis Burrow [facility]) 420 mg in sodium chloride 0.9 % 250 mL chemo infusion  6 mg/kg (Treatment Plan Recorded) Intravenous Once Lenya Sterne, Virgie Dad, MD        OBJECTIVE: Spanish speaker examined in the infusion chair  There were no vitals filed for this visit.   There is no height or weight on file to calculate BMI.   Wt Readings from Last 3 Encounters:  07/18/20 156 lb 12 oz (71.1 kg)  06/29/20 157 lb 6.4 oz (71.4 kg)  06/21/20 161 lb 9.6 oz (73.3 kg)      ECOG FS:1 - Symptomatic but completely ambulatory  Sclerae unicteric,  EOMs intact Wearing a mask No cervical or supraclavicular adenopathy Lungs no rales or rhonchi Heart regular rate and rhythm Abd soft, nontender, positive bowel sounds MSK no focal spinal tenderness, no upper extremity lymphedema Neuro: nonfocal, well oriented, appropriate affect Breasts: Deferred   LAB RESULTS:  CMP     Component Value Date/Time   NA 141 07/18/2020 1112   NA 138 12/15/2019 1543   K 3.9 07/18/2020 1112   CL 108 07/18/2020 1112   CO2 26 07/18/2020 1112   GLUCOSE 106 (H) 07/18/2020 1112   BUN 9 07/18/2020 1112   BUN 10 12/15/2019 1543   CREATININE 0.71 07/18/2020 1112   CREATININE 0.77 02/22/2020 1353   CALCIUM 9.0 07/18/2020 1112   PROT 6.6 07/18/2020 1112   PROT 7.3 12/15/2019 1543   ALBUMIN 3.6 07/18/2020 1112   ALBUMIN 4.5 12/15/2019 1543   AST 59 (H) 07/18/2020 1112   AST 13 (L) 02/22/2020 1353   ALT 60 (H) 07/18/2020 1112   ALT 8 02/22/2020 1353   ALKPHOS 73 07/18/2020 1112   BILITOT 0.3 07/18/2020 1112   BILITOT 0.3 02/22/2020 1353   GFRNONAA >60 07/18/2020 1112   GFRNONAA >60 02/22/2020 1353   GFRAA >60 03/28/2020 0813   GFRAA >60 02/22/2020 1353    No results found for: TOTALPROTELP, ALBUMINELP, A1GS, A2GS, BETS, BETA2SER, GAMS, MSPIKE, SPEI  Lab Results  Component Value Date   WBC 4.9 07/18/2020   NEUTROABS 2.8 07/18/2020   HGB 12.7 07/18/2020   HCT 38.5 07/18/2020   MCV 102.1 (H) 07/18/2020   PLT 431 (H) 07/18/2020    No results found for: LABCA2  No components found for: [id number]  No results for input(s): INR in the last 168 hours.  No results found for: LABCA2  No results found for: YQI347  No results found for: QQV956  No results found for: LOV564  No results found for: CA2729  No components found for: HGQUANT  No results found for: CEA1 / No results found for: CEA1   No results found for: AFPTUMOR  No results found for: CHROMOGRNA  No results found for: KPAFRELGTCHN, LAMBDASER, KAPLAMBRATIO (kappa/lambda  light chains)  Lab Results  Component Value Date   HGBA 97.0 10/10/2018   (Hemoglobinopathy evaluation)   No results found for: LDH  No results found for: IRON, TIBC, IRONPCTSAT (Iron and TIBC)  No results found for: FERRITIN  Urinalysis    Component Value Date/Time  COLORURINE YELLOW 12/31/2017 2039   APPEARANCEUR CLOUDY (A) 12/31/2017 2039   LABSPEC 1.015 12/31/2017 2039   PHURINE 5.0 12/31/2017 2039   GLUCOSEU NEGATIVE 12/31/2017 2039   HGBUR MODERATE (A) 12/31/2017 2039   BILIRUBINUR NEGATIVE 12/31/2017 2039   KETONESUR NEGATIVE 12/31/2017 2039   PROTEINUR NEGATIVE 12/31/2017 2039   NITRITE NEGATIVE 12/31/2017 2039   LEUKOCYTESUR LARGE (A) 12/31/2017 2039    STUDIES: ECHOCARDIOGRAM COMPLETE  Result Date: 06/29/2020    ECHOCARDIOGRAM REPORT   Patient Name:   Jill [name] DE CORTEZ Date of Exam: 06/29/2020 Medical Rec #:  [id number]                        Height:       58.0 in Accession #:    [id number]                       Weight:       161.6 lb Date of Birth:  December 14, 1975                       BSA:          1.663 m Patient Age:    33 years                         BP:           106/73 mmHg Patient Gender: F                                HR:           110 bpm. Exam Location:  Outpatient Procedure: 2D Echo Indications:    congestive heart failure 428.0  History:        Patient has prior history of Echocardiogram examinations, most                 recent 03/18/2020.  Sonographer:    Johny Chess RDCS Referring Phys: [id number] DANIEL R BENSIMHON IMPRESSIONS  1. GLS likely underestimated due to poor endocardial tracking. . Left ventricular ejection fraction, by estimation, is 65 to 70%. The left ventricle has normal function. The left ventricle has no regional wall motion abnormalities. Left ventricular diastolic parameters are consistent with Grade I diastolic dysfunction (impaired relaxation).  2. Right ventricular systolic function is normal. The right ventricular size is  normal.  3. The mitral valve is normal in structure. Trivial mitral valve regurgitation. No evidence of mitral stenosis.  4. The aortic valve is normal in structure. Aortic valve regurgitation is not visualized. No aortic stenosis is present.  5. The inferior vena cava is normal in size with greater than 50% respiratory variability, suggesting right atrial pressure of 3 mmHg. FINDINGS  Left Ventricle: GLS likely underestimated due to poor endocardial tracking. Left ventricular ejection fraction, by estimation, is 65 to 70%. The left ventricle has normal function. The left ventricle has no regional wall motion abnormalities. The left ventricular internal cavity size was normal in size. There is no left ventricular hypertrophy. Left ventricular diastolic parameters are consistent with Grade I diastolic dysfunction (impaired relaxation). Right Ventricle: The right ventricular size is normal. No increase in right ventricular wall thickness. Right ventricular systolic function is normal. Left Atrium: Left atrial size was normal in size. Right Atrium: Right atrial size was normal in size. Pericardium: There is no  evidence of pericardial effusion. Mitral Valve: The mitral valve is normal in structure. Trivial mitral valve regurgitation. No evidence of mitral valve stenosis. Tricuspid Valve: The tricuspid valve is normal in structure. Tricuspid valve regurgitation is not demonstrated. No evidence of tricuspid stenosis. Aortic Valve: The aortic valve is normal in structure. Aortic valve regurgitation is not visualized. No aortic stenosis is present. Pulmonic Valve: The pulmonic valve was normal in structure. Pulmonic valve regurgitation is not visualized. No evidence of pulmonic stenosis. Aorta: The aortic root is normal in size and structure. Venous: The inferior vena cava is normal in size with greater than 50% respiratory variability, suggesting right atrial pressure of 3 mmHg. IAS/Shunts: No atrial level shunt detected  by color flow Doppler.   Diastology LV e' medial:    9.79 cm/s LV E/e' medial:  8.5 LV e' lateral:   11.00 cm/s LV E/e' lateral: 7.6  2D Longitudinal Strain 2D Strain GLS Avg:     -15.6 % RIGHT VENTRICLE             IVC RV S prime:     12.30 cm/s  IVC diam: 1.30 cm TAPSE (M-mode): 1.5 cm LEFT ATRIUM             Index       RIGHT ATRIUM          Index LA Vol (A2C):   24.5 ml 14.73 ml/m RA Area:     8.38 cm LA Vol (A4C):   23.1 ml 13.89 ml/m RA Volume:   14.50 ml 8.72 ml/m LA Biplane Vol: 24.0 ml 14.43 ml/m  AORTIC VALVE LVOT Vmax:   115.00 cm/s LVOT Vmean:  76.100 cm/s LVOT VTI:    0.172 m  AORTA Ao Asc diam: 2.30 cm MV E velocity: 83.20 cm/s MV A velocity: 102.00 cm/s  SHUNTS MV E/A ratio:  0.82         Systemic VTI: 0.17 m Glori Bickers MD Electronically signed by Glori Bickers MD Signature Date/Time: 06/29/2020/2:15:48 PM    Final      ELIGIBLE FOR AVAILABLE RESEARCH PROTOCOL: AET  ASSESSMENT: 45 y.o. [place] woman status post right breast upper outer quadrant biopsy 02/04/2020 for a clinical T2 N0, stage IB invasive ductal carcinoma, grade 2, triple positive, with an MIB-1 of 20%  (1) status post right mastectomy and sentinel lymph node sampling 03/07/2020 for a pT2 pN1, stage IB invasive ductal carcinoma, grade 2, with negative margins.  (a) a total of 4 lymph nodes were removed, one positive  (2) adjuvant chemo immunotherapy will consist of carboplatin, docetaxel, trastuzumab and pertuzumab every 21 days x 6 starting 03/29/2020, completed 07/18/2020  (a) prtuzumab discontinued after cycle 2 secondary to diarrhea  (3) trastuzumab to continue to complete a year  (a) echo 03/18/2020 shows an ejection fraction in the 60-65% range  (b) echo 06/29/2020 showed an EF in the 65-70% range  (4) adjuvant radiation to follow  (5) genetics testing 03/02/2020 through the [facility] Common Hereditary Cancers Panel. . The Common Hereditary Cancers Panel found no deleterious mutations in APC, ATM,  AXIN2, BARD1, BMPR1A, BRCA1, BRCA2, BRIP1, CDH1, CDK4, CDKN2A (p14ARF), CDKN2A (p16INK4a), CHEK2, CTNNA1, DICER1, EPCAM (Deletion/duplication testing only), GREM1 (promoter region deletion/duplication testing only), KIT, MEN1, MLH1, MSH2, MSH3, MSH6, MUTYH, NBN, NF1, NHTL1, PALB2, PDGFRA, PMS2, POLD1, POLE, PTEN, RAD50, RAD51C, RAD51D, RNF43, SDHB, SDHC, SDHD, SMAD4, SMARCA4. STK11, TP53, TSC1, TSC2, and VHL.  The following genes were evaluated for sequence changes only: SDHA and HOXB13 c.251G>A variant only.   (  a)  a Variant of uncertain significance (VUS) in DICER1 at c.3380T>G (p.Ile1127Ser) was noted  (6) tamoxifen started 02/12/2020 in anticipation of possible surgical delays, held during chemotherapy   PLAN: Tamakia got to "ring the bell" today, completing her chemotherapy treatments.  She has done well with treatment although she has had a little bit more postoperative pain than I am used to.  I did give her an extra 15 Percocets today.  I am hopeful to transition her away from narcotics after this point.  She is not ready for adjuvant radiation.  I have alerted the radiation oncologist so they can start the process.  She will return in 3 weeks for trastuzumab and I will see her again in 6 weeks.  She knows to call for any other issue that may develop before then.  Total encounter time 25 minutes.Sarajane Jews C. Nabil Bubolz, MD 07/18/20 12:52 PM Medical Oncology and Hematology [facility] [place], [place] [address] Tel. [phone]    Fax. [phone]   I, Wilburn Mylar, am acting as scribe for Dr. Virgie Dad. Haedyn Breau.  I, Lurline Del MD, have reviewed the above documentation for accuracy and completeness, and I agree with the above.    *Total Encounter Time as defined by the Centers for Medicare and Medicaid Services includes, in addition to the face-to-face time of a patient visit (documented in the note above) non-face-to-face time: obtaining  and reviewing outside history, ordering and reviewing medications, tests or procedures, care coordination (communications with other health care professionals or caregivers) and documentation in the medical record.

## 2020-07-18 ENCOUNTER — Other Ambulatory Visit: Payer: Self-pay | Admitting: *Deleted

## 2020-07-18 ENCOUNTER — Other Ambulatory Visit (HOSPITAL_COMMUNITY): Payer: Self-pay | Admitting: Oncology

## 2020-07-18 ENCOUNTER — Inpatient Hospital Stay (HOSPITAL_BASED_OUTPATIENT_CLINIC_OR_DEPARTMENT_OTHER): Payer: No Typology Code available for payment source | Admitting: Oncology

## 2020-07-18 ENCOUNTER — Other Ambulatory Visit: Payer: Self-pay

## 2020-07-18 ENCOUNTER — Inpatient Hospital Stay: Payer: No Typology Code available for payment source

## 2020-07-18 ENCOUNTER — Encounter: Payer: Self-pay | Admitting: *Deleted

## 2020-07-18 ENCOUNTER — Inpatient Hospital Stay: Payer: Self-pay | Attending: Oncology

## 2020-07-18 VITALS — BP 115/73 | HR 92 | Temp 98.0°F | Resp 17 | Wt 156.8 lb

## 2020-07-18 DIAGNOSIS — C50411 Malignant neoplasm of upper-outer quadrant of right female breast: Secondary | ICD-10-CM

## 2020-07-18 DIAGNOSIS — Z170 Estrogen receptor positive status [ER+]: Secondary | ICD-10-CM

## 2020-07-18 DIAGNOSIS — Z79899 Other long term (current) drug therapy: Secondary | ICD-10-CM | POA: Insufficient documentation

## 2020-07-18 DIAGNOSIS — Z793 Long term (current) use of hormonal contraceptives: Secondary | ICD-10-CM | POA: Insufficient documentation

## 2020-07-18 DIAGNOSIS — Z5112 Encounter for antineoplastic immunotherapy: Secondary | ICD-10-CM | POA: Insufficient documentation

## 2020-07-18 DIAGNOSIS — Z833 Family history of diabetes mellitus: Secondary | ICD-10-CM | POA: Insufficient documentation

## 2020-07-18 DIAGNOSIS — Z5111 Encounter for antineoplastic chemotherapy: Secondary | ICD-10-CM | POA: Insufficient documentation

## 2020-07-18 DIAGNOSIS — Z8249 Family history of ischemic heart disease and other diseases of the circulatory system: Secondary | ICD-10-CM | POA: Insufficient documentation

## 2020-07-18 DIAGNOSIS — Z9011 Acquired absence of right breast and nipple: Secondary | ICD-10-CM | POA: Insufficient documentation

## 2020-07-18 DIAGNOSIS — Z5189 Encounter for other specified aftercare: Secondary | ICD-10-CM | POA: Insufficient documentation

## 2020-07-18 DIAGNOSIS — Z975 Presence of (intrauterine) contraceptive device: Secondary | ICD-10-CM | POA: Insufficient documentation

## 2020-07-18 DIAGNOSIS — Z95828 Presence of other vascular implants and grafts: Secondary | ICD-10-CM

## 2020-07-18 LAB — COMPREHENSIVE METABOLIC PANEL
ALT: 60 U/L — ABNORMAL HIGH (ref 0–44)
AST: 59 U/L — ABNORMAL HIGH (ref 15–41)
Albumin: 3.6 g/dL (ref 3.5–5.0)
Alkaline Phosphatase: 73 U/L (ref 38–126)
Anion gap: 7 (ref 5–15)
BUN: 9 mg/dL (ref 6–20)
CO2: 26 mmol/L (ref 22–32)
Calcium: 9 mg/dL (ref 8.9–10.3)
Chloride: 108 mmol/L (ref 98–111)
Creatinine, Ser: 0.71 mg/dL (ref 0.44–1.00)
GFR, Estimated: >60 mL/min (ref >60)
Glucose, Bld: 106 mg/dL — ABNORMAL HIGH (ref 70–99)
Potassium: 3.9 mmol/L (ref 3.5–5.1)
Sodium: 141 mmol/L (ref 135–145)
Total Bilirubin: 0.3 mg/dL (ref 0.3–1.2)
Total Protein: 6.6 g/dL (ref 6.5–8.1)

## 2020-07-18 LAB — CBC WITH DIFFERENTIAL/PLATELET
Abs Immature Granulocytes: 0 10*3/uL (ref 0.00–0.07)
Basophils Absolute: 0 10*3/uL (ref 0.0–0.1)
Basophils Relative: 1 %
Eosinophils Absolute: 0.1 10*3/uL (ref 0.0–0.5)
Eosinophils Relative: 2 %
HCT: 38.5 % (ref 36.0–46.0)
Hemoglobin: 12.7 g/dL (ref 12.0–15.0)
Immature Granulocytes: 0 %
Lymphocytes Relative: 30 %
Lymphs Abs: 1.5 10*3/uL (ref 0.7–4.0)
MCH: 33.7 pg (ref 26.0–34.0)
MCHC: 33 g/dL (ref 30.0–36.0)
MCV: 102.1 fL — ABNORMAL HIGH (ref 80.0–100.0)
Monocytes Absolute: 0.5 10*3/uL (ref 0.1–1.0)
Monocytes Relative: 9 %
Neutro Abs: 2.8 10*3/uL (ref 1.7–7.7)
Neutrophils Relative %: 58 %
Platelets: 431 10*3/uL — ABNORMAL HIGH (ref 150–400)
RBC: 3.77 MIL/uL — ABNORMAL LOW (ref 3.87–5.11)
RDW: 13.4 % (ref 11.5–15.5)
WBC: 4.9 10*3/uL (ref 4.0–10.5)
nRBC: 0 % (ref 0.0–0.2)

## 2020-07-18 MED ORDER — SODIUM CHLORIDE 0.9% FLUSH
10.0000 mL | Freq: Once | INTRAVENOUS | Status: AC
  Administered 2020-07-18: 10 mL
  Filled 2020-07-18: qty 10

## 2020-07-18 MED ORDER — HYDROCODONE-ACETAMINOPHEN 5-325 MG PO TABS: 1.0000 | ORAL_TABLET | Freq: Four times a day (QID) | ORAL | 0 refills | Status: DC | PRN

## 2020-07-18 MED ORDER — SODIUM CHLORIDE 0.9 % IV SOLN
618.0000 mg | Freq: Once | INTRAVENOUS | Status: DC
  Filled 2020-07-18: qty 62

## 2020-07-18 MED ORDER — ACETAMINOPHEN 325 MG PO TABS
650.0000 mg | ORAL_TABLET | Freq: Once | ORAL | Status: AC
  Administered 2020-07-18: 650 mg via ORAL

## 2020-07-18 MED ORDER — SODIUM CHLORIDE 0.9 % IV SOLN
75.0000 mg/m2 | Freq: Once | INTRAVENOUS | Status: AC
  Administered 2020-07-18: 130 mg via INTRAVENOUS
  Filled 2020-07-18: qty 13

## 2020-07-18 MED ORDER — TRASTUZUMAB-ANNS CHEMO 150 MG IV SOLR
6.0000 mg/kg | Freq: Once | INTRAVENOUS | Status: AC
  Administered 2020-07-18: 420 mg via INTRAVENOUS
  Filled 2020-07-18: qty 20

## 2020-07-18 MED ORDER — PROCHLORPERAZINE MALEATE 10 MG PO TABS: 10.0000 mg | ORAL_TABLET | Freq: Four times a day (QID) | ORAL | 1 refills | Status: DC | PRN

## 2020-07-18 MED ORDER — SODIUM CHLORIDE 0.9 % IV SOLN
10.0000 mg | Freq: Once | INTRAVENOUS | Status: AC
  Administered 2020-07-18: 10 mg via INTRAVENOUS
  Filled 2020-07-18: qty 10

## 2020-07-18 MED ORDER — PALONOSETRON HCL INJECTION 0.25 MG/5ML
0.2500 mg | Freq: Once | INTRAVENOUS | Status: AC
  Administered 2020-07-18: 0.25 mg via INTRAVENOUS

## 2020-07-18 MED ORDER — ACETAMINOPHEN 325 MG PO TABS
ORAL_TABLET | ORAL | Status: AC
  Filled 2020-07-18: qty 2

## 2020-07-18 MED ORDER — SODIUM CHLORIDE 0.9 % IV SOLN
150.0000 mg | Freq: Once | INTRAVENOUS | Status: AC
  Administered 2020-07-18: 150 mg via INTRAVENOUS
  Filled 2020-07-18: qty 150

## 2020-07-18 MED ORDER — PALONOSETRON HCL INJECTION 0.25 MG/5ML
INTRAVENOUS | Status: AC
  Filled 2020-07-18: qty 5

## 2020-07-18 MED ORDER — HEPARIN SOD (PORK) LOCK FLUSH 100 UNIT/ML IV SOLN
500.0000 [IU] | Freq: Once | INTRAVENOUS | Status: AC | PRN
  Administered 2020-07-18: 500 [IU]
  Filled 2020-07-18: qty 5

## 2020-07-18 MED ORDER — DIPHENHYDRAMINE HCL 25 MG PO CAPS
ORAL_CAPSULE | ORAL | Status: AC
  Filled 2020-07-18: qty 1

## 2020-07-18 MED ORDER — ALTEPLASE 2 MG IJ SOLR
INTRAMUSCULAR | Status: AC
  Filled 2020-07-18: qty 2

## 2020-07-18 MED ORDER — SODIUM CHLORIDE 0.9% FLUSH
10.0000 mL | INTRAVENOUS | Status: DC | PRN
  Administered 2020-07-18: 10 mL
  Filled 2020-07-18: qty 10

## 2020-07-18 MED ORDER — DIPHENHYDRAMINE HCL 25 MG PO CAPS
25.0000 mg | ORAL_CAPSULE | Freq: Once | ORAL | Status: AC
  Administered 2020-07-18: 25 mg via ORAL

## 2020-07-18 MED ORDER — ALTEPLASE 2 MG IJ SOLR
2.0000 mg | Freq: Once | INTRAMUSCULAR | Status: AC
  Administered 2020-07-18: 2 mg
  Filled 2020-07-18: qty 2

## 2020-07-18 MED ORDER — DEXAMETHASONE 4 MG PO TABS: 8.0000 mg | ORAL_TABLET | Freq: Two times a day (BID) | ORAL | 1 refills | Status: DC

## 2020-07-18 MED ORDER — SODIUM CHLORIDE 0.9 % IV SOLN
Freq: Once | INTRAVENOUS | Status: AC
  Filled 2020-07-18: qty 250

## 2020-07-18 MED FILL — PROCHLORPERAZINE 10 MG TAB: 10 | 7 days supply | Qty: 30 | Fill #0

## 2020-07-18 MED FILL — HYDROCODON-APAP 5-325: 5-325 | 2 days supply | Qty: 15 | Fill #0

## 2020-07-18 MED FILL — DEXAMETHASONE 4 MG TABLET: 4 | 9 days supply | Qty: 30 | Fill #1

## 2020-07-18 MED FILL — DEXAMETHASONE 4 MG TABLET: 4 | 9 days supply | Qty: 30 | Fill #0

## 2020-07-18 NOTE — Patient Instructions (Addendum)
[facility] Discharge Instructions for Patients Receiving Chemotherapy  Today you received the following chemotherapy agents: Trastuzumab, Docetaxel, Carboplatin  To help prevent nausea and vomiting after your treatment, we encourage you to take your nausea medication as directed.   If you develop nausea and vomiting that is not controlled by your nausea medication, call the clinic.   BELOW ARE SYMPTOMS THAT SHOULD BE REPORTED IMMEDIATELY:  *FEVER GREATER THAN 100.5 F  *CHILLS WITH OR WITHOUT FEVER  NAUSEA AND VOMITING THAT IS NOT CONTROLLED WITH YOUR NAUSEA MEDICATION  *UNUSUAL SHORTNESS OF BREATH  *UNUSUAL BRUISING OR BLEEDING  TENDERNESS IN MOUTH AND THROAT WITH OR WITHOUT PRESENCE OF ULCERS  *URINARY PROBLEMS  *BOWEL PROBLEMS  UNUSUAL RASH Items with * indicate a potential emergency and should be followed up as soon as possible.  Feel free to call the clinic should you have any questions or concerns. The clinic phone number is [phone].  Please show the [place] at check-in to the Emergency Department and triage nurse.

## 2020-07-19 ENCOUNTER — Telehealth: Payer: Self-pay | Admitting: Oncology

## 2020-07-19 NOTE — Telephone Encounter (Signed)
Cancelled injection appts per 1/24 los. Made no other changes to pt's schedule.

## 2020-07-20 ENCOUNTER — Other Ambulatory Visit: Payer: Self-pay

## 2020-07-20 ENCOUNTER — Inpatient Hospital Stay: Payer: No Typology Code available for payment source

## 2020-07-20 VITALS — BP 111/77 | HR 100 | Temp 98.1°F | Resp 18

## 2020-07-20 DIAGNOSIS — C50411 Malignant neoplasm of upper-outer quadrant of right female breast: Secondary | ICD-10-CM

## 2020-07-20 DIAGNOSIS — Z170 Estrogen receptor positive status [ER+]: Secondary | ICD-10-CM

## 2020-07-20 MED ORDER — PEGFILGRASTIM-CBQV 6 MG/0.6ML ~~LOC~~ SOSY
6.0000 mg | PREFILLED_SYRINGE | Freq: Once | SUBCUTANEOUS | Status: AC
  Administered 2020-07-20: 6 mg via SUBCUTANEOUS

## 2020-07-20 MED ORDER — PEGFILGRASTIM-CBQV 6 MG/0.6ML ~~LOC~~ SOSY
PREFILLED_SYRINGE | SUBCUTANEOUS | Status: AC
  Filled 2020-07-20: qty 0.6

## 2020-07-20 NOTE — Progress Notes (Signed)
Location of Breast Cancer: [facility] Right Breast  Did patient present with symptoms (if so, please note symptoms) or was this found on screening mammography?: Patient palpated mass in right breast sometime in 2019.  Diagnostic Mammogram 01/21/2020: Breast density category C, 4.1 cm irregular mass with associated group of pleomorphic calcifications measuring approximately 4.9 cm, located at site of palpable concern in upper outer right breast at 9:30, no axillary lymphadenopathy.  Histology per Pathology Report: Right Breast Mastectomy 03/07/2020  Receptor Status: ER(+100%), PR (+100%), Her2-neu (+), Ki-67(20%)    Past/Anticipated interventions by surgeon, if any:  Past/Anticipated interventions by medical oncology, if any: Chemotherapy  Dr. Jana Hakim 07/18/2020 (1) status post right mastectomy and sentinel lymph node sampling 03/07/2020 for a pT2 pN1, stage IB invasive ductal carcinoma, grade 2, with negative margins.             (a) a total of 4 lymph nodes were removed, one positive  (2) adjuvant chemo immunotherapy will consist of carboplatin, docetaxel, trastuzumab and pertuzumab every 21 days x 6 starting 03/29/2020, completed 07/18/2020             (a) prtuzumab discontinued after cycle 2 secondary to diarrhea  (3) trastuzumab to continue to complete a year             (a) echo 03/18/2020 shows an ejection fraction in the 60-65% range             (b) echo 06/29/2020 showed an EF in the 65-70% range  (4) adjuvant radiation to follow  -Jill [name] got to "ring the bell" today, completing her chemotherapy treatments. -She is not ready for adjuvant radiation.  I have alerted the radiation oncologist so they can start the process.   Lymphedema issues, if any:  No  Pain issues, if any: No  Having some fatigue and weakness due to recent chemo completion.  SAFETY ISSUES:  Prior radiation? No  Pacemaker/ICD? No  Possible current pregnancy? IUD  Is the patient on methotrexate?  No  Current Complaints / other details:   -Genetics: No deleterious mutations 03/02/2020     Cori Razor, RN 07/20/2020,10:34 AM

## 2020-07-20 NOTE — Patient Instructions (Signed)
Pegfilgrastim injection Qu es este medicamento? El PEGFILGRASTIM es un factor estimulante de colonias de granulocitos de accin prolongada que estimula el crecimiento de los neutrfilos, un tipo de glbulo blanco importante en la lucha del cuerpo contra la infeccin. Se utiliza para reducir la incidencia de la fiebre y la infeccin en pacientes con ciertos tipos de cncer que reciben quimioterapia que afecta la mdula sea, y para aumentar la supervivencia despus de estar expuesto a altas dosis de radiacin. Este medicamento puede ser utilizado para otros usos; si tiene alguna pregunta consulte con su proveedor de atencin mdica o con su farmacutico. MARCAS COMUNES: Fulphila, Neulasta, Nyvepria, UDENYCA, Ziextenzo Qu le debo informar a mi profesional de la salud antes de tomar este medicamento? Necesitan saber si usted presenta alguno de los siguientes problemas o situaciones: enfermedad renal alergia al ltex terapia de radiacin en curso enfermedad de clulas falciformes reacciones en la piel a los adhesivos acrlicos (inyector en el cuerpo solamente) una reaccin alrgica o inusual al pegfilgrastim, al filgrastim, a otros medicamentos, alimentos, colorantes o conservantes si est embarazada o buscando quedar embarazada si est amamantando a un beb Cmo debo utilizar este medicamento? Este medicamento se administra mediante inyeccin por va subcutnea. Si recibe este medicamento en su casa, le ensearn cmo preparar y administrar la jeringa prellenada o cmo usar el inyector en el cuerpo. Consulte las instrucciones de uso para el paciente para obtener instrucciones detalladas. Use el medicamento exactamente como se le indique. Informe de inmediato a su proveedor de atencin de la salud si sospecha que su inyector en el cuerpo puede haber funcionado incorrectamente o si sospecha que el uso de su inyector en el cuerpo hizo que usted recibiera una dosis parcial o no recibiera una dosis. Es  importante que deseche las agujas y las jeringas usadas en un recipiente resistente a los pinchazos. No las deseche en la basura. Si no tiene un recipiente resistente a los pinchazos, llame a su farmacutico o proveedor de atencin de la salud para obtenerlo. Hable con su pediatra para informarse acerca del uso de este medicamento en nios. Aunque este medicamento se puede recetar para ciertas afecciones, existen precauciones que deben tomarse. Sobredosis: Pngase en contacto inmediatamente con un centro toxicolgico o una sala de urgencia si usted cree que haya tomado demasiado medicamento. ATENCIN: Este medicamento es solo para usted. No comparta este medicamento con nadie. Qu sucede si me olvido de una dosis? Es importante no olvidar ninguna dosis. Hable con su mdico o profesional de la salud si olvida su dosis. Si olvida una dosis debido a una falla o fuga del inyector en el cuerpo, se deber administrar una nueva dosis tan pronto como sea posible usando una nica jeringa prellenada para uso manual. Qu puede interactuar con este medicamento? No se han estudiado las interacciones. Puede ser que esta lista no menciona todas las posibles interacciones. Informe a su profesional de la salud de todos los productos a base de hierbas, medicamentos de venta libre o suplementos nutritivos que est tomando. Si usted fuma, consume bebidas alcohlicas o si utiliza drogas ilegales, indqueselo tambin a su profesional de la salud. Algunas sustancias pueden interactuar con su medicamento. A qu debo estar atento al usar este medicamento? Se supervisar su estado de salud atentamente mientras reciba este medicamento. Usted podra necesitar realizarse anlisis de sangre mientras est usando este medicamento. Hable con su proveedor de atencin mdica sobre su riesgo de cncer. Usted puede tener mayor riesgo para ciertos tipos de cncer si usa este   medicamento. Si va a someterse a una IRM (MRI), tomografa  computarizada u otro procedimiento, informe a su mdico que est usando este medicamento (inyector en el cuerpo solamente). Qu efectos secundarios puedo tener al utilizar este medicamento? Efectos secundarios que debe informar a su mdico o a su profesional de la salud tan pronto como sea posible: reacciones alrgicas (erupcin cutnea, comezn/picazn o urticaria, hinchazn de la cara, los labios o la lengua) dolor de espalda mareos fiebre dolor, enrojecimiento o irritacin en el lugar de la inyeccin puntos rojos en la piel orina roja o marrn oscuro falta de aire o problemas respiratorios dolor de estmago o del costado, o dolor en el hombro hinchazn cansancio dificultad para orinar o cambios en la cantidad de orina sangrado o moretones inusuales Efectos secundarios que generalmente no requieren atencin mdica (infrmelos a su mdico o a su profesional de la salud si persisten o si son molestos): dolor de huesos dolor muscular Puede ser que esta lista no menciona todos los posibles efectos secundarios. Comunquese a su mdico por asesoramiento mdico sobre los efectos secundarios. Usted puede informar los efectos secundarios a la FDA por telfono al 1-800-FDA-1088. Dnde debo guardar mi medicina? Mantenga fuera del alcance de los nios. Si est usando este medicamento en su casa, le indicarn cmo guardarlo. Deseche todo el medicamento que no haya utilizado despus de la fecha de vencimiento indicada en la etiqueta. ATENCIN: Este folleto es un resumen. Puede ser que no cubra toda la posible informacin. Si usted tiene preguntas acerca de esta medicina, consulte con su mdico, su farmacutico o su profesional de la salud.  2021 Elsevier/Gold Standard (2019-12-15 00:00:00)   

## 2020-07-21 ENCOUNTER — Ambulatory Visit
Admission: RE | Admit: 2020-07-21 | Discharge: 2020-07-21 | Disposition: A | Discharge status: Home / Self Care | Payer: No Typology Code available for payment source | Source: Ambulatory Visit | Attending: Radiation Oncology | Admitting: Radiation Oncology

## 2020-07-21 ENCOUNTER — Encounter: Payer: Self-pay | Admitting: Radiation Oncology

## 2020-07-21 ENCOUNTER — Other Ambulatory Visit: Payer: Self-pay

## 2020-07-21 VITALS — BP 122/71 | HR 102 | Temp 97.2°F | Resp 18 | Ht <= 58 in | Wt 165.0 lb

## 2020-07-21 DIAGNOSIS — Z170 Estrogen receptor positive status [ER+]: Secondary | ICD-10-CM | POA: Insufficient documentation

## 2020-07-21 DIAGNOSIS — C50411 Malignant neoplasm of upper-outer quadrant of right female breast: Secondary | ICD-10-CM

## 2020-07-21 DIAGNOSIS — Z9011 Acquired absence of right breast and nipple: Secondary | ICD-10-CM | POA: Insufficient documentation

## 2020-07-21 DIAGNOSIS — Z7952 Long term (current) use of systemic steroids: Secondary | ICD-10-CM | POA: Insufficient documentation

## 2020-07-21 NOTE — Progress Notes (Signed)
Radiation Oncology         [phone] ________________________________  Name: Jill [name]        MRN: [id number]  Date of Service: 07/21/2020 DOB: December 22, 1975  GE:ZMOQHUT, Vernia Buff, NP  Magrinat, Virgie Dad, MD     REFERRING PHYSICIAN: Magrinat, Virgie Dad, MD   DIAGNOSIS: The encounter diagnosis was Malignant neoplasm of upper-outer quadrant of right breast in female, estrogen receptor positive ([place]).   HISTORY OF PRESENT ILLNESS: Jill [name] is a 45 y.o. female with a diagnosis of  right breast cancer. The patient was noted to have a mass palpable in 2019, she became pregnant however and this delayed her work-up until after her delivery. She ultimately sought care and underwent diagnostic mammogram on 01/21/2020 which revealed a large mass with calcifications.  Ultrasound measured the area at 2.7 x 4.1 x 1.8 cm at the 930 o'clock position.  No adenopathy was identified.  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  She underwent right mastectomy on 03/07/2020 which revealed a 2.9 cm grade 2 invasive ductal carcinoma with intermediate grade DCIS associated as well as fat necrosis and calcifications.  4 sentinel lymph nodes were removed and one contained metastatic disease.  She subsequently received adjuvant chemo and immunotherapy.  Genetic testing was performed and did not reveal any deleterious mutations.  She completed her last dose of chemotherapy on Monday of this week and is seen today to discuss adjuvant radiotherapy. Marland Kitchen    PREVIOUS RADIATION THERAPY: No   PAST MEDICAL HISTORY:  Past Medical History:  Diagnosis Date  . Breast cancer ([place]) 03/07/2020  . Infected cyst of Bartholin's gland duct   . Medical history non-contributory        PAST SURGICAL HISTORY: Past Surgical History:  Procedure Laterality Date  . IR IMAGING GUIDED PORT INSERTION  03/02/2020  . MASTECTOMY W/  SENTINEL NODE BIOPSY Right 03/07/2020   Procedure: RIGHT MASTECTOMY WITH SENTINEL LYMPH NODE BIOPSY;  Surgeon: Jovita Kussmaul, MD;  Location: [facility];  Service: General;  Laterality: Right;  . NO PAST SURGERIES    . WISDOM TOOTH EXTRACTION       FAMILY HISTORY:  Family History  Problem Relation Age of Onset  . Diabetes Maternal Grandmother   . Hypertension Mother      SOCIAL HISTORY:  reports that she has never smoked. She has never used smokeless tobacco. She reports that she does not drink alcohol and does not use drugs.   ALLERGIES: Patient has no known allergies.   MEDICATIONS:  Current Outpatient Medications  Medication Sig Dispense Refill  . acetaminophen (TYLENOL) 500 MG tablet Take 500 mg by mouth every 6 (six) hours as needed for moderate pain or headache.    . calcium carbonate (TUMS) 500 MG chewable tablet Chew 1 tablet (200 mg of elemental calcium total) by mouth 3 (three) times daily as needed for indigestion or heartburn. 90 tablet 0  . dexamethasone (DECADRON) 4 MG tablet Take 2 tablets (8 mg total) by mouth 2 (two) times daily. Take decadron  Twice daily the day before taxotere then daily x 3 days after carboplatin  chemo 30 tablet 1  . HYDROcodone-acetaminophen (NORCO/VICODIN) 5-325 MG tablet Take 1-2 tablets by mouth every 6 (six) hours as needed for moderate pain or severe pain. 15 tablet 0  . ibuprofen (ADVIL) 200 MG tablet Take 400 mg by mouth every  6 (six) hours as needed for headache or moderate pain.    Marland Kitchen lidocaine-prilocaine (EMLA) cream Apply to affected area once 30 g 3  . loratadine (CLARITIN) 10 MG tablet Take 1 tablet (10 mg total) by mouth daily. 60 tablet 6  . methocarbamol (ROBAXIN) 750 MG tablet Take 1 tablet (750 mg total) by mouth 4 (four) times daily as needed (use for muscle cramps/pain). 30 tablet 2  . omeprazole (PRILOSEC) 40 MG capsule Take 1 capsule (40 mg total) by mouth daily. 30 capsule 0  . prochlorperazine (COMPAZINE) 10  MG tablet Take 1 tablet (10 mg total) by mouth every 6 (six) hours as needed (Nausea or vomiting). 30 tablet 1  . valACYclovir (VALTREX) 1000 MG tablet Take 1 tablet (1,000 mg total) by mouth daily. Take days 1 through 7 of each chemotherapy cycle 30 tablet 0   No current facility-administered medications for this encounter.     REVIEW OF SYSTEMS: On review of systems, the patient reports that she is doing well overall.  She did have fatigue during chemotherapy.  She also had some diarrhea during her immunotherapy.  She has been fatigued, but is ready to complete her treatment.  No other complaints are verbalized.    PHYSICAL EXAM:  Wt Readings from Last 3 Encounters:  07/21/20 165 lb (74.8 kg)  07/18/20 156 lb 12 oz (71.1 kg)  06/29/20 157 lb 6.4 oz (71.4 kg)   Temp Readings from Last 3 Encounters:  07/21/20 (!) 97.2 F (36.2 C) (Temporal)  07/20/20 98.1 F (36.7 C)  07/18/20 98 F (36.7 C) (Oral)   BP Readings from Last 3 Encounters:  07/21/20 122/71  07/20/20 111/77  07/18/20 115/73   Pulse Readings from Last 3 Encounters:  07/21/20 (!) 102  07/20/20 100  07/18/20 92    In general this is a well appearing Hispanic female in no acute distress. She's alert and oriented x4 and appropriate throughout the examination. Cardiopulmonary assessment is negative for acute distress and she exhibits normal effort. Bilateral breast exam is deferred.    ECOG = 1  0 - Asymptomatic (Fully active, able to carry on all predisease activities without restriction)  1 - Symptomatic but completely ambulatory (Restricted in physically strenuous activity but ambulatory and able to carry out work of a light or sedentary nature. For example, light housework, office work)  2 - Symptomatic, <50% in bed during the day (Ambulatory and capable of all self care but unable to carry out any work activities. Up and about more than 50% of waking hours)  3 - Symptomatic, >50% in bed, but not bedbound  (Capable of only limited self-care, confined to bed or chair 50% or more of waking hours)  4 - Bedbound (Completely disabled. Cannot carry on any self-care. Totally confined to bed or chair)  5 - Death   Eustace Pen MM, Creech RH, Tormey DC, et al. [phone]). "Toxicity and response criteria of the [facility] Group". [place] Oncol. 5 (6): 649-55    LABORATORY DATA:  Lab Results  Component Value Date   WBC 4.9 07/18/2020   HGB 12.7 07/18/2020   HCT 38.5 07/18/2020   MCV 102.1 (H) 07/18/2020   PLT 431 (H) 07/18/2020   Lab Results  Component Value Date   NA 141 07/18/2020   K 3.9 07/18/2020   CL 108 07/18/2020   CO2 26 07/18/2020   Lab Results  Component Value Date   ALT 60 (H) 07/18/2020   AST 59 (H)  07/18/2020   ALKPHOS 73 07/18/2020   BILITOT 0.3 07/18/2020      RADIOGRAPHY: ECHOCARDIOGRAM COMPLETE  Result Date: 06/29/2020    ECHOCARDIOGRAM REPORT   Patient Name:   Jill [name] Date of Exam: 06/29/2020 Medical Rec #:  [id number]                        Height:       58.0 in Accession #:    [id number]                       Weight:       161.6 lb Date of Birth:  06-26-1975                       BSA:          1.663 m Patient Age:    52 years                         BP:           106/73 mmHg Patient Gender: F                                HR:           110 bpm. Exam Location:  Outpatient Procedure: 2D Echo Indications:    congestive heart failure 428.0  History:        Patient has prior history of Echocardiogram examinations, most                 recent 03/18/2020.  Sonographer:    Johny Chess RDCS Referring Phys: [id number] DANIEL R BENSIMHON IMPRESSIONS  1. GLS likely underestimated due to poor endocardial tracking. . Left ventricular ejection fraction, by estimation, is 65 to 70%. The left ventricle has normal function. The left ventricle has no regional wall motion abnormalities. Left ventricular diastolic parameters are consistent with Grade I  diastolic dysfunction (impaired relaxation).  2. Right ventricular systolic function is normal. The right ventricular size is normal.  3. The mitral valve is normal in structure. Trivial mitral valve regurgitation. No evidence of mitral stenosis.  4. The aortic valve is normal in structure. Aortic valve regurgitation is not visualized. No aortic stenosis is present.  5. The inferior vena cava is normal in size with greater than 50% respiratory variability, suggesting right atrial pressure of 3 mmHg. FINDINGS  Left Ventricle: GLS likely underestimated due to poor endocardial tracking. Left ventricular ejection fraction, by estimation, is 65 to 70%. The left ventricle has normal function. The left ventricle has no regional wall motion abnormalities. The left ventricular internal cavity size was normal in size. There is no left ventricular hypertrophy. Left ventricular diastolic parameters are consistent with Grade I diastolic dysfunction (impaired relaxation). Right Ventricle: The right ventricular size is normal. No increase in right ventricular wall thickness. Right ventricular systolic function is normal. Left Atrium: Left atrial size was normal in size. Right Atrium: Right atrial size was normal in size. Pericardium: There is no evidence of pericardial effusion. Mitral Valve: The mitral valve is normal in structure. Trivial mitral valve regurgitation. No evidence of mitral valve stenosis. Tricuspid Valve: The tricuspid valve is normal in structure. Tricuspid valve regurgitation is not demonstrated. No evidence of tricuspid stenosis. Aortic Valve: The aortic valve is normal in structure. Aortic valve  regurgitation is not visualized. No aortic stenosis is present. Pulmonic Valve: The pulmonic valve was normal in structure. Pulmonic valve regurgitation is not visualized. No evidence of pulmonic stenosis. Aorta: The aortic root is normal in size and structure. Venous: The inferior vena cava is normal in size with  greater than 50% respiratory variability, suggesting right atrial pressure of 3 mmHg. IAS/Shunts: No atrial level shunt detected by color flow Doppler.   Diastology LV e' medial:    9.79 cm/s LV E/e' medial:  8.5 LV e' lateral:   11.00 cm/s LV E/e' lateral: 7.6  2D Longitudinal Strain 2D Strain GLS Avg:     -15.6 % RIGHT VENTRICLE             IVC RV S prime:     12.30 cm/s  IVC diam: 1.30 cm TAPSE (M-mode): 1.5 cm LEFT ATRIUM             Index       RIGHT ATRIUM          Index LA Vol (A2C):   24.5 ml 14.73 ml/m RA Area:     8.38 cm LA Vol (A4C):   23.1 ml 13.89 ml/m RA Volume:   14.50 ml 8.72 ml/m LA Biplane Vol: 24.0 ml 14.43 ml/m  AORTIC VALVE LVOT Vmax:   115.00 cm/s LVOT Vmean:  76.100 cm/s LVOT VTI:    0.172 m  AORTA Ao Asc diam: 2.30 cm MV E velocity: 83.20 cm/s MV A velocity: 102.00 cm/s  SHUNTS MV E/A ratio:  0.82         Systemic VTI: 0.17 m Glori Bickers MD Electronically signed by Glori Bickers MD Signature Date/Time: 06/29/2020/2:15:48 PM    Final        IMPRESSION/PLAN: 1. Stage IB, [id number] grade 2, triple positive invasive ductal carcinoma of the right breast. Dr. Lisbeth Renshaw discusses the pathology findings and reviews the nature of *node positive breast cancer.  He discusses the rationale to proceed with postmastectomy radiation to the right chest wall and regional lymph nodes to reduce risks of local recurrence followed by antiestrogen therapy. We discussed the risks, benefits, short, and long term effects of radiotherapy, as well as the curative intent, and the patient is interested in proceeding. Dr. Lisbeth Renshaw discusses the delivery and logistics of radiotherapy and anticipates a course of 6 1/2 weeks of radiotherapy to the right chest wall and regional lymph nodes. Written consent is obtained and placed in the chart, a copy was provided to the patient.  She will return on 08/09/2020 for simulation and begin radiation the week of 08/15/2020. 2. Contraceptive counseling.  The patient has an IUD  and reports that she has not been sexually active.  She had a negative pregnancy test approximately 2 weeks ago.  She was informed about the reasonings for avoiding pregnancy during radiation, and no additional testing is required unless she were to change her modality of birth control and/or become sexually active.  She is in agreement with this plan.  In a visit lasting 60 minutes, greater than 50% of the time was spent face to face reviewing her case, as well as in preparation of, discussing, and coordinating the patient's care.  The encounter was with the assistance of a Spanish-speaking medical interpreter.  The above documentation reflects my direct findings during this shared patient visit. Please see the separate note by Dr. Lisbeth Renshaw on this date for the remainder of the patient's plan of care.    Carola Rhine, PAC

## 2020-07-25 ENCOUNTER — Encounter: Payer: Self-pay | Admitting: *Deleted

## 2020-08-03 ENCOUNTER — Other Ambulatory Visit: Payer: Self-pay

## 2020-08-03 ENCOUNTER — Inpatient Hospital Stay: Payer: No Typology Code available for payment source

## 2020-08-03 ENCOUNTER — Inpatient Hospital Stay (HOSPITAL_BASED_OUTPATIENT_CLINIC_OR_DEPARTMENT_OTHER): Payer: Self-pay | Admitting: Medical

## 2020-08-03 ENCOUNTER — Inpatient Hospital Stay: Payer: No Typology Code available for payment source | Attending: Oncology

## 2020-08-03 VITALS — BP 115/68 | HR 95 | Temp 99.1°F | Resp 16 | Ht <= 58 in | Wt 170.6 lb

## 2020-08-03 DIAGNOSIS — Z170 Estrogen receptor positive status [ER+]: Secondary | ICD-10-CM | POA: Insufficient documentation

## 2020-08-03 DIAGNOSIS — Z8249 Family history of ischemic heart disease and other diseases of the circulatory system: Secondary | ICD-10-CM | POA: Insufficient documentation

## 2020-08-03 DIAGNOSIS — C50411 Malignant neoplasm of upper-outer quadrant of right female breast: Secondary | ICD-10-CM

## 2020-08-03 DIAGNOSIS — Z5112 Encounter for antineoplastic immunotherapy: Secondary | ICD-10-CM | POA: Insufficient documentation

## 2020-08-03 DIAGNOSIS — Z833 Family history of diabetes mellitus: Secondary | ICD-10-CM | POA: Insufficient documentation

## 2020-08-03 DIAGNOSIS — Z95828 Presence of other vascular implants and grafts: Secondary | ICD-10-CM

## 2020-08-03 LAB — COMPREHENSIVE METABOLIC PANEL
ALT: 68 U/L — ABNORMAL HIGH (ref 0–44)
AST: 47 U/L — ABNORMAL HIGH (ref 15–41)
Albumin: 3.5 g/dL (ref 3.5–5.0)
Alkaline Phosphatase: 91 U/L (ref 38–126)
Anion gap: 7 (ref 5–15)
BUN: 9 mg/dL (ref 6–20)
CO2: 26 mmol/L (ref 22–32)
Calcium: 9 mg/dL (ref 8.9–10.3)
Chloride: 107 mmol/L (ref 98–111)
Creatinine, Ser: 0.6 mg/dL (ref 0.44–1.00)
GFR, Estimated: >60 mL/min (ref >60)
Glucose, Bld: 88 mg/dL (ref 70–99)
Potassium: 3.8 mmol/L (ref 3.5–5.1)
Sodium: 140 mmol/L (ref 135–145)
Total Bilirubin: 0.2 mg/dL — ABNORMAL LOW (ref 0.3–1.2)
Total Protein: 6.2 g/dL — ABNORMAL LOW (ref 6.5–8.1)

## 2020-08-03 LAB — CBC WITH DIFFERENTIAL/PLATELET
Abs Immature Granulocytes: 0.04 10*3/uL (ref 0.00–0.07)
Basophils Absolute: 0.1 10*3/uL (ref 0.0–0.1)
Basophils Relative: 1 %
Eosinophils Absolute: 0 10*3/uL (ref 0.0–0.5)
Eosinophils Relative: 0 %
HCT: 35.7 % — ABNORMAL LOW (ref 36.0–46.0)
Hemoglobin: 11.7 g/dL — ABNORMAL LOW (ref 12.0–15.0)
Immature Granulocytes: 1 %
Lymphocytes Relative: 28 %
Lymphs Abs: 1.5 10*3/uL (ref 0.7–4.0)
MCH: 33.1 pg (ref 26.0–34.0)
MCHC: 32.8 g/dL (ref 30.0–36.0)
MCV: 101.1 fL — ABNORMAL HIGH (ref 80.0–100.0)
Monocytes Absolute: 0.4 10*3/uL (ref 0.1–1.0)
Monocytes Relative: 8 %
Neutro Abs: 3.4 10*3/uL (ref 1.7–7.7)
Neutrophils Relative %: 62 %
Platelets: 236 10*3/uL (ref 150–400)
RBC: 3.53 MIL/uL — ABNORMAL LOW (ref 3.87–5.11)
RDW: 13.5 % (ref 11.5–15.5)
WBC: 5.4 10*3/uL (ref 4.0–10.5)
nRBC: 0 % (ref 0.0–0.2)

## 2020-08-03 MED ORDER — SODIUM CHLORIDE 0.9 % IV SOLN
Freq: Once | INTRAVENOUS | Status: AC
  Filled 2020-08-03: qty 250

## 2020-08-03 MED ORDER — ACETAMINOPHEN 325 MG PO TABS
650.0000 mg | ORAL_TABLET | Freq: Once | ORAL | Status: AC
  Administered 2020-08-03: 650 mg via ORAL

## 2020-08-03 MED ORDER — SODIUM CHLORIDE 0.9% FLUSH
10.0000 mL | INTRAVENOUS | Status: DC | PRN
  Administered 2020-08-03: 10 mL
  Filled 2020-08-03: qty 10

## 2020-08-03 MED ORDER — DIPHENHYDRAMINE HCL 25 MG PO CAPS
25.0000 mg | ORAL_CAPSULE | Freq: Once | ORAL | Status: AC
  Administered 2020-08-03: 25 mg via ORAL

## 2020-08-03 MED ORDER — HEPARIN SOD (PORK) LOCK FLUSH 100 UNIT/ML IV SOLN
500.0000 [IU] | Freq: Once | INTRAVENOUS | Status: AC | PRN
  Administered 2020-08-03: 500 [IU]
  Filled 2020-08-03: qty 5

## 2020-08-03 MED ORDER — TRASTUZUMAB-ANNS CHEMO 150 MG IV SOLR
6.0000 mg/kg | Freq: Once | INTRAVENOUS | Status: AC
  Administered 2020-08-03: 420 mg via INTRAVENOUS
  Filled 2020-08-03: qty 20

## 2020-08-03 MED ORDER — DIPHENHYDRAMINE HCL 25 MG PO CAPS
ORAL_CAPSULE | ORAL | Status: AC
  Filled 2020-08-03: qty 1

## 2020-08-03 MED ORDER — ACETAMINOPHEN 325 MG PO TABS
ORAL_TABLET | ORAL | Status: AC
  Filled 2020-08-03: qty 2

## 2020-08-03 MED ORDER — SODIUM CHLORIDE 0.9% FLUSH
10.0000 mL | Freq: Once | INTRAVENOUS | Status: AC
  Administered 2020-08-03: 10 mL
  Filled 2020-08-03: qty 10

## 2020-08-03 NOTE — Patient Instructions (Signed)
Implanted Port Insertion, Care After This sheet gives you information about how to care for yourself after your procedure. Your health care provider may also give you more specific instructions. If you have problems or questions, contact your health care provider. What can I expect after the procedure? After the procedure, it is common to have:  Discomfort at the port insertion site.  Bruising on the skin over the port. This should improve over 3-4 days. Follow these instructions at home: Port care  After your port is placed, you will get a manufacturer's information card. The card has information about your port. Keep this card with you at all times.  Take care of the port as told by your health care provider. Ask your health care provider if you or a family member can get training for taking care of the port at home. A home health care nurse may also take care of the port.  Make sure to remember what type of port you have. Incision care  Follow instructions from your health care provider about how to take care of your port insertion site. Make sure you: ? Wash your hands with soap and water before and after you change your bandage (dressing). If soap and water are not available, use hand sanitizer. ? Change your dressing as told by your health care provider. ? Leave stitches (sutures), skin glue, or adhesive strips in place. These skin closures may need to stay in place for 2 weeks or longer. If adhesive strip edges start to loosen and curl up, you may trim the loose edges. Do not remove adhesive strips completely unless your health care provider tells you to do that.  Check your port insertion site every day for signs of infection. Check for: ? Redness, swelling, or pain. ? Fluid or blood. ? Warmth. ? Pus or a bad smell.      Activity  Return to your normal activities as told by your health care provider. Ask your health care provider what activities are safe for you.  Do not  lift anything that is heavier than 10 lb (4.5 kg), or the limit that you are told, until your health care provider says that it is safe. General instructions  Take over-the-counter and prescription medicines only as told by your health care provider.  Do not take baths, swim, or use a hot tub until your health care provider approves. Ask your health care provider if you may take showers. You may only be allowed to take sponge baths.  Do not drive for 24 hours if you were given a sedative during your procedure.  Wear a medical alert bracelet in case of an emergency. This will tell any health care providers that you have a port.  Keep all follow-up visits as told by your health care provider. This is important. Contact a health care provider if:  You cannot flush your port with saline as directed, or you cannot draw blood from the port.  You have a fever or chills.  You have redness, swelling, or pain around your port insertion site.  You have fluid or blood coming from your port insertion site.  Your port insertion site feels warm to the touch.  You have pus or a bad smell coming from the port insertion site. Get help right away if:  You have chest pain or shortness of breath.  You have bleeding from your port that you cannot control. Summary  Take care of the port as told by your   health care provider. Keep the manufacturer's information card with you at all times.  Change your dressing as told by your health care provider.  Contact a health care provider if you have a fever or chills or if you have redness, swelling, or pain around your port insertion site.  Keep all follow-up visits as told by your health care provider. This information is not intended to replace advice given to you by your health care provider. Make sure you discuss any questions you have with your health care provider. Document Revised: 01/07/2018 Document Reviewed: 01/07/2018 Elsevier Patient Education   2021 Elsevier Inc.  

## 2020-08-03 NOTE — Progress Notes (Signed)
Ok to proceed with Herceptin treatment (a few days early).  Raul Del Harvey, [place], BCPS, BCOP 08/03/2020 11:05 AM

## 2020-08-03 NOTE — Patient Instructions (Signed)
Trastuzumab injection for infusion O que  este medicamento? O TRASTUZUMABE  um anticorpo monoclonal.  usado para tratar o cncer de mama e do estmago. Este medicamento pode ser usado para outros propsitos; em caso de dvidas, pergunte ao seu profissional de sade ou Development worker, international aid. NOMES DE MARCAS COMUNS: Herceptin, Elliot Gault, KANJINTI, Ogivri, Ontruzant, Trazimera O que devo dizer a meu profissional de sade antes de tomar este medicamento? Precisam saber se voc tem algum dos seguintes problemas ou estados de sade:  doenas cardacas  insuficincia cardaca  doenas pulmonares ou respiratrias (por exemplo, asma)  reao estranha ou alergia ao trastuzumabe ou ao lcool benzlico  reao estranha ou alergia a outros medicamentos, alimentos, corantes ou conservantes  est grvida ou tentando engravidar  est [employer] [place] devo usar este medicamento? Este medicamento  para infuso intravenosa.  administrado em ambiente hospitalar ou em consultrio por um profissional de sade devidamente treinado. Fale com seu pediatra a respeito do uso deste medicamento em crianas. Este medicamento no foi aprovado para uso em crianas. Superdosagem: Se achar que tomou uma superdosagem deste medicamento, entre em contato imediatamente com o [facility] ou v a [employer]. OBSERVAO: Este medicamento  s para voc. No compartilhe este medicamento com outras pessoas. E se eu deixar de tomar uma dose?  importante que voc no perca nenhuma dose. Se no puder comparecer a uma consulta, entre em contato com seu mdico ou profissional de sade. O que pode interagir com este medicamento? Este medicamento pode interagir com os seguintes remdios:  certos tipos de quimioterapia, como daunorrubicina, doxorrubicina, epirrubicina e idarrubicina Esta lista pode no descrever todas as interaes possveis. D ao seu profissional de sade uma lista de todos os medicamentos,  ervas medicinais, remdios de venda livre, ou suplementos alimentares que voc [place]. Diga tambm se voc fuma, bebe, ou [place] drogas ilcitas. Alguns destes podem interagir com o seu medicamento. Ao que devo ficar atento quando estiver [employer] medicamento? Consulte seu mdico ou profissional de sade para realizar um acompanhamento regular Diplomatic Services operational officer. Relate qualquer efeito colateral. Continue o tratamento mesmo quando estiver se sentindo doente, a menos que seu mdico lhe mande parar. Se tiver febre, calafrios, dor de garganta ou outros sintomas de resfriado ou gripe, pea orientao ao seu mdico ou profissional de sade. No se automedique. Evite ficar perto de pessoas doentes. Voc pode ter febre, calafrios e tremores durante a primeira infuso. Estes efeitos costumam ser leves e podem ser tratados com outros medicamentos. Caso ocorra qualquer outro efeito colateral durante a infuso, avise seu mdico ou profissional de sade. A febre e os calafrios no costumam ocorrer nas infuses seguintes. No engravide enquanto estiver tomando este medicamento nem por 7 meses aps parar de tom-lo. [place] que queiram engravidar ou que acreditem que possam estar grvidas devem avisar aos seus mdicos. Mulheres em idade reprodutiva precisam apresentar um exame de gravidez negativo antes de comear a tomar este medicamento. H risco de graves efeitos colaterais no feto. Para mais informaes, fale com seu mdico, profissional de sade ou farmacutico. No amamente enquanto estiver tomando este medicamento nem por 7 meses aps parar de tom-lo. Mulheres devem usar um mtodo anticoncepcional eficaz enquanto estiverem tomando este medicamento. Que efeitos colaterais posso sentir aps usar este medicamento? Efeitos colaterais que devem ser informados ao seu mdico ou profissional de sade o mais rpido possvel:  reaes alrgicas, como erupo na pele, coceira, urticria, ou inchao do rosto, dos lbios ou da  lngua  dor no peito  palpitaes  tosse  tontura  sensao de [place], [place], quedas  febre  mal-estar generalizado ou sintomas gripais  sinais de agravamento da insuficincia cardaca, como problemas respiratrios; [employer] pernas e ps  fraqueza ou cansao fora do comum Efeitos colaterais que normalmente no precisam de cuidados mdicos (avise ao seu mdico ou profissional de sade se persistirem ou forem incmodos):  dor nos ossos  alterao no paladar  diarreia  dores nas articulaes  enjoo ou vmitos  perda de [employer] pode no descrever todos os efeitos colaterais possveis. Para mais orientaes sobre efeitos colaterais, consulte o seu mdico. Voc pode relatar a ocorrncia de efeitos colaterais  FDA pelo telefone [phone]. Onde devo guardar meu medicamento? Este medicamento  administrado por um profissional da sade no hospital ou em consultrio. Voc no receber este medicamento para conservao em casa. OBSERVAO: Este folheto  um resumo. Pode no cobrir todas as informaes possveis. Se tiver dvidas a respeito deste medicamento, fale com seu mdico, farmacutico ou profissional de sade.  2021 Elsevier/Gold Standard (2019-12-15 00:00:00)

## 2020-08-03 NOTE — Progress Notes (Signed)
[facility] Progress Note   Jill [name] [id number] 10/19/1975 45 y.o.  Jill [name] is managed by Dr. Jana Hakim  Actively treated with chemotherapy/immunotherapy/hormonal therapy: yes  Current therapy: Trastuzumab  Last treated: 07/18/2020 (cycle 6, day 1)  Next scheduled appointment with provider: 08/24/2020  Assessment: Plan:    Malignant neoplasm of upper-outer quadrant of right breast in female, estrogen receptor positive ([place])   ER positive malignant neoplasm of the right breast: The patient presents today for her first treatment of Trastuzumab only after receiving 6 cycles of TCHP.  Please see After Visit Summary for patient specific instructions.  Future Appointments  Date Time Provider [place]  08/09/2020 10:00 AM Kyung Rudd, MD [facility] None  08/24/2020  8:45 AM [facility] None  08/24/2020  9:00 AM [facility] [place] FLUSH [facility] None  08/24/2020  9:30 AM Magrinat, Virgie Dad, MD [facility] None  08/24/2020 10:15 AM [facility] INFUSION [facility] None  09/28/2020 10:00 AM [facility] ECHO OP 1 [facility]  09/28/2020 11:00 AM Bensimhon, Shaune Pascal, MD [facility] None    No orders of the defined types were placed in this encounter.      Subjective:   Patient ID:  Jill [name] is a 45 y.o. (DOB 06-18-1976) female.  Chief Complaint: No chief complaint on file.   HPI Jill [name]  is a 45 y.o. female with a diagnosis of an ER positive malignant neoplasm of the right breast.  She is managed by Dr. Jana Hakim and presents for her first treatment of Trastuzumab only after receiving 6 cycles of TCHP.  She is seen with an interpreter today.  She is doing well without any acute issues of concern.  Her last echocardiogram was completed on 06/29/2020 and returned with an EF between 65 and 70%.  She denies fevers, chills, sweats, shortness of breath, chest pain nausea,  vomiting, constipation, or diarrhea.  Her appetite is stable.   Medications: I have reviewed the patient's current medications.  Allergies: No Known Allergies  Past Medical History:  Diagnosis Date  . Breast cancer ([place]) 03/07/2020  . Infected cyst of Bartholin's gland duct   . Medical history non-contributory     Past Surgical History:  Procedure Laterality Date  . IR IMAGING GUIDED PORT INSERTION  03/02/2020  . MASTECTOMY W/ SENTINEL NODE BIOPSY Right 03/07/2020   Procedure: RIGHT MASTECTOMY WITH SENTINEL LYMPH NODE BIOPSY;  Surgeon: Jovita Kussmaul, MD;  Location: [facility];  Service: General;  Laterality: Right;  . NO PAST SURGERIES    . WISDOM TOOTH EXTRACTION      Family History  Problem Relation Age of Onset  . Diabetes Maternal Grandmother   . Hypertension Mother     Social History   Socioeconomic History  . Marital status: Single    Spouse name: Not on file  . Number of children: 4  . Years of education: Not on file  . Highest education level: 6th grade  Occupational History  . Not on file  Tobacco Use  . Smoking status: Never Smoker  . Smokeless tobacco: Never Used  Vaping Use  . Vaping Use: Never used  Substance and Sexual Activity  . Alcohol use: Never  . Drug use: Never  . Sexual activity: Not Currently    Birth control/protection: I.U.D.  Other Topics Concern  . Not on file  Social History Narrative  . Not on file   Social Determinants of Health  Financial Resource Strain: Not on file  Food Insecurity: Not on file  Transportation Needs: No Transportation Needs  . Lack of Transportation (Medical): No  . Lack of Transportation (Non-Medical): No  Physical Activity: Not on file  Stress: Not on file  Social Connections: Not on file  Intimate Partner Violence: Not At Risk  . Fear of Current or Ex-Partner: No  . Emotionally Abused: No  . Physically Abused: No  . Sexually Abused: No    Past Medical History, Surgical history,  Social history, and Family history were reviewed and updated as appropriate.   Please see review of systems for further details on the patient's review from today.   Review of Systems:  Review of Systems  Constitutional: Negative for chills, diaphoresis and fever.  HENT: Negative for trouble swallowing and voice change.   Respiratory: Negative for cough, chest tightness, shortness of breath and wheezing.   Cardiovascular: Negative for chest pain and palpitations.  Gastrointestinal: Negative for abdominal pain, constipation, diarrhea, nausea and vomiting.  Musculoskeletal: Negative for back pain and myalgias.  Neurological: Negative for dizziness, light-headedness and headaches.    Objective:   Physical Exam:  BP 115/68 (BP Location: Left Arm, Patient Position: Sitting)   Pulse 95   Temp 99.1 F (37.3 C) (Tympanic)   Resp 16   Ht 4\' 10"  (1.473 m)   Wt 170 lb 9.6 oz (77.4 kg)   SpO2 100%   BMI 35.66 kg/m  ECOG: 0  Physical Exam Constitutional:      General: She is not in acute distress.    Appearance: She is not diaphoretic.  HENT:     Head: Normocephalic and atraumatic.  Eyes:     General: No scleral icterus.       Right eye: No discharge.        Left eye: No discharge.     Conjunctiva/sclera: Conjunctivae normal.  Cardiovascular:     Rate and Rhythm: Normal rate and regular rhythm.     Heart sounds: Normal heart sounds. No murmur heard. No friction rub. No gallop.   Pulmonary:     Effort: Pulmonary effort is normal. No respiratory distress.     Breath sounds: Normal breath sounds. No wheezing or rales.  Abdominal:     General: Bowel sounds are normal. There is no distension.     Tenderness: There is no abdominal tenderness.  Musculoskeletal:     Right lower leg: No edema.     Left lower leg: No edema.  Skin:    General: Skin is warm and dry.     Findings: No erythema or rash.  Neurological:     Mental Status: She is alert.     Coordination: Coordination  normal.     Gait: Gait normal.     Lab Review:     Component Value Date/Time   NA 140 08/03/2020 0938   NA 138 12/15/2019 1543   K 3.8 08/03/2020 0938   CL 107 08/03/2020 0938   CO2 26 08/03/2020 0938   GLUCOSE 88 08/03/2020 0938   BUN 9 08/03/2020 0938   BUN 10 12/15/2019 1543   CREATININE 0.60 08/03/2020 0938   CREATININE 0.77 02/22/2020 1353   CALCIUM 9.0 08/03/2020 0938   PROT 6.2 (L) 08/03/2020 0938   PROT 7.3 12/15/2019 1543   ALBUMIN 3.5 08/03/2020 0938   ALBUMIN 4.5 12/15/2019 1543   AST 47 (H) 08/03/2020 0938   AST 13 (L) 02/22/2020 1353   ALT 68 (H) 08/03/2020 5726  ALT 8 02/22/2020 1353   ALKPHOS 91 08/03/2020 0938   BILITOT 0.2 (L) 08/03/2020 0938   BILITOT 0.3 02/22/2020 1353   GFRNONAA >60 08/03/2020 0938   GFRNONAA >60 02/22/2020 1353   GFRAA >60 03/28/2020 0813   GFRAA >60 02/22/2020 1353       Component Value Date/Time   WBC 5.4 08/03/2020 0938   RBC 3.53 (L) 08/03/2020 0938   HGB 11.7 (L) 08/03/2020 0938   HGB 13.9 02/22/2020 1353   HGB 14.4 12/15/2019 1543   HCT 35.7 (L) 08/03/2020 0938   HCT 42.4 12/15/2019 1543   PLT 236 08/03/2020 0938   PLT 382 02/22/2020 1353   PLT 395 12/15/2019 1543   MCV 101.1 (H) 08/03/2020 0938   MCV 91 12/15/2019 1543   MCH 33.1 08/03/2020 0938   MCHC 32.8 08/03/2020 0938   RDW 13.5 08/03/2020 0938   RDW 12.5 12/15/2019 1543   LYMPHSABS 1.5 08/03/2020 0938   MONOABS 0.4 08/03/2020 0938   EOSABS 0.0 08/03/2020 0938   BASOSABS 0.1 08/03/2020 0938   -------------------------------  Imaging from last 24 hours (if applicable):  Radiology interpretation: No results found.     OK to receive Kanjinti only today.  Sandi Mealy, MHS, PA-C Physician Assistant

## 2020-08-03 NOTE — Progress Notes (Signed)
Ok to treat 5 days early per Sandi Mealy, PA and [place], [facility].

## 2020-08-05 ENCOUNTER — Ambulatory Visit: Payer: No Typology Code available for payment source

## 2020-08-09 ENCOUNTER — Ambulatory Visit
Admission: RE | Admit: 2020-08-09 | Discharge: 2020-08-09 | Disposition: A | Discharge status: Home / Self Care | Payer: No Typology Code available for payment source | Source: Ambulatory Visit | Attending: Radiation Oncology | Admitting: Radiation Oncology

## 2020-08-09 ENCOUNTER — Other Ambulatory Visit: Payer: Self-pay

## 2020-08-09 DIAGNOSIS — C50411 Malignant neoplasm of upper-outer quadrant of right female breast: Secondary | ICD-10-CM | POA: Insufficient documentation

## 2020-08-09 DIAGNOSIS — Z510 Encounter for antineoplastic radiation therapy: Secondary | ICD-10-CM | POA: Insufficient documentation

## 2020-08-09 DIAGNOSIS — Z170 Estrogen receptor positive status [ER+]: Secondary | ICD-10-CM | POA: Insufficient documentation

## 2020-08-15 ENCOUNTER — Encounter: Payer: Self-pay | Admitting: *Deleted

## 2020-08-17 ENCOUNTER — Ambulatory Visit
Admission: RE | Admit: 2020-08-17 | Discharge: 2020-08-17 | Disposition: A | Discharge status: Home / Self Care | Payer: No Typology Code available for payment source | Source: Ambulatory Visit | Attending: Radiation Oncology | Admitting: Radiation Oncology

## 2020-08-17 ENCOUNTER — Other Ambulatory Visit: Payer: Self-pay

## 2020-08-18 ENCOUNTER — Ambulatory Visit
Admission: RE | Admit: 2020-08-18 | Discharge: 2020-08-18 | Disposition: A | Discharge status: Home / Self Care | Payer: No Typology Code available for payment source | Source: Ambulatory Visit | Attending: Radiation Oncology | Admitting: Radiation Oncology

## 2020-08-18 ENCOUNTER — Other Ambulatory Visit: Payer: Self-pay

## 2020-08-18 NOTE — Progress Notes (Signed)
Pt here for patient teaching.  Pt given Radiation and You booklet, skin care instructions, Alra deodorant and Radiaplex gel.  Reviewed areas of pertinence such as fatigue, hair loss, skin changes, breast tenderness, breast swelling and taste changes . Pt able to give teach back of to pat skin and use unscented/gentle soap,apply Radiaplex bid, avoid applying anything to skin within 4 hours of treatment, avoid wearing an under wire bra and to use an electric razor if they must shave. Pt verbalizes understanding of information given and will contact nursing with any questions or concerns.  Interpreter pad used for communication.  Gloriajean Dell. Leonie Green, BSN

## 2020-08-19 ENCOUNTER — Ambulatory Visit
Admission: RE | Admit: 2020-08-19 | Discharge: 2020-08-19 | Disposition: A | Discharge status: Home / Self Care | Payer: No Typology Code available for payment source | Source: Ambulatory Visit | Attending: Radiation Oncology | Admitting: Radiation Oncology

## 2020-08-19 ENCOUNTER — Other Ambulatory Visit: Payer: Self-pay

## 2020-08-19 DIAGNOSIS — Z170 Estrogen receptor positive status [ER+]: Secondary | ICD-10-CM

## 2020-08-19 MED ORDER — ALRA NON-METALLIC DEODORANT (RAD-ONC)
1.0000 "application " | Freq: Once | TOPICAL | Status: AC
  Administered 2020-08-19: 1 via TOPICAL

## 2020-08-19 MED ORDER — RADIAPLEXRX EX GEL: Freq: Once | CUTANEOUS | Status: AC

## 2020-08-22 ENCOUNTER — Ambulatory Visit
Admission: RE | Admit: 2020-08-22 | Discharge: 2020-08-22 | Disposition: A | Discharge status: Home / Self Care | Payer: No Typology Code available for payment source | Source: Ambulatory Visit | Attending: Radiation Oncology | Admitting: Radiation Oncology

## 2020-08-22 ENCOUNTER — Other Ambulatory Visit: Payer: Self-pay

## 2020-08-23 ENCOUNTER — Other Ambulatory Visit: Payer: Self-pay

## 2020-08-23 ENCOUNTER — Ambulatory Visit
Admission: RE | Admit: 2020-08-23 | Discharge: 2020-08-23 | Disposition: A | Discharge status: Home / Self Care | Payer: No Typology Code available for payment source | Source: Ambulatory Visit | Attending: Radiation Oncology | Admitting: Radiation Oncology

## 2020-08-23 DIAGNOSIS — Z170 Estrogen receptor positive status [ER+]: Secondary | ICD-10-CM | POA: Insufficient documentation

## 2020-08-23 DIAGNOSIS — C50411 Malignant neoplasm of upper-outer quadrant of right female breast: Secondary | ICD-10-CM | POA: Insufficient documentation

## 2020-08-23 NOTE — Progress Notes (Signed)
[place]  Telephone:([phone]) [phone] Fax:(336) [phone]     ID: Jill [name] DOB: Dec 14, 1975  MR#: [id number]  CNO#:[id number]  Patient Care Team: Gildardo Pounds, NP as PCP - General (Nurse Practitioner) Caydin Yeatts, Virgie Dad, MD as Consulting Physician (Oncology) Jovita Kussmaul, MD as Consulting Physician (General Surgery) Dillingham, Loel Lofty, DO as Attending Physician (Plastic Surgery) Mauro Kaufmann, RN as Oncology Nurse Navigator Rockwell [place], RN as Oncology Nurse Navigator Chauncey Cruel, MD OTHER MD:  CHIEF COMPLAINT: triple positive breast cancer (s/p right mastectomy)  CURRENT TREATMENT: Adjuvant radiation therapy; trastuzumab   INTERVAL HISTORY: Jill [name] returns today for follow up of her triple positive breast cancer.  My nurse Beth participated during this visit  Since her last visit, she was referred back to Dr. Lisbeth Renshaw on 07/21/2020 to review radiation therapy. She subsequently began treatment on 08/17/2020 and is scheduled to finish on 09/30/2020.  She continues on trastuzumab.  Most recent echocardiogram was 06/29/2020 and shows a well-preserved ejection fraction   REVIEW OF SYSTEMS: Carlisle tells me that since her pregnancy about a year and a half or 2 years ago she has had problems at the very bottom of her pelvis.  She says that she has been evaluated for hemorrhoids and does not have hemorrhoids.  Her bowel movements do not hurt.  It feels though like there is something the matter right in there she says and she would like to know what that is.  In addition she has significant pain in the tailbone area.  She has pain in other bones as well although that is in constant.  She is only now beginning to have some skin changes from her radiation.  Aside from this a detailed review of systems today was stable   COVID 19 VACCINATION STATUS: fully vaccinated [employer]), no booster as of December 2021   HISTORY OF CURRENT ILLNESS: From the  original intake note:  "[name]" herself palpated a right breast abnormality sometime in 2019.,  She did not pay much mind.  This was noted again during her pregnancy and she underwent bilateral diagnostic mammography with tomography and right breast ultrasonography at [facility] on 01/21/2020 showing: breast density category C; 4.1 cm irregular mass with associated group of pleomorphic calcifications measuring approximately 4.9 cm, located at site of palpable concern in upper-outer right breast at 9:30; no right axillary lymphadenopathy.  Accordingly on 02/04/2020 she proceeded to biopsy of the right breast area in question. The pathology from this procedure ([id number]) showed: invasive and in situ mammary carcinoma, e-cadherin positive, grade 2. Prognostic indicators significant for: estrogen receptor, 100% positive and progesterone receptor, 100% positive, both with strong staining intensity. Proliferation marker Ki67 at 20%. HER2 equivocal by immunohistochemistry (2+), but positive by fluorescent in situ hybridization with a signals ratio 5.39 and number per cell 11.05.  The patient's subsequent history is as detailed below.   PAST MEDICAL HISTORY: Past Medical History:  Diagnosis Date  . Breast cancer ([place]) 03/07/2020  . Infected cyst of Bartholin's gland duct   . Medical history non-contributory     PAST SURGICAL HISTORY: Past Surgical History:  Procedure Laterality Date  . IR IMAGING GUIDED PORT INSERTION  03/02/2020  . MASTECTOMY W/ SENTINEL NODE BIOPSY Right 03/07/2020   Procedure: RIGHT MASTECTOMY WITH SENTINEL LYMPH NODE BIOPSY;  Surgeon: Jovita Kussmaul, MD;  Location: [facility];  Service: General;  Laterality: Right;  . NO PAST SURGERIES    . WISDOM  TOOTH EXTRACTION      FAMILY HISTORY: Family History  Problem Relation Age of Onset  . Diabetes Maternal Grandmother   . Hypertension Mother   The patient's father is 57 and her mother 65 as of August 2021.   The patient is 3 brothers and 2 sisters.  There is no history of cancer in the family to her knowledge   GYNECOLOGIC HISTORY:  No LMP recorded. (Menstrual status: IUD). Menarche: 45 years old Age at first live birth: 45 years old [place] P 4 LMP irregular Contraceptive: Mirena IUD in place HRT n/a  Hysterectomy? no BSO? 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  Her significant other [name] works Occupational hygienist.  They are both from [place].  The patient's children are ages 59, 59, 2, and 57 months as of August 2021.  The older 3 children are from a different father and their last name is [name].  (The patient was not married to Mr. [name] so there is no legal issue regarding access to medical records, etc.). The 28-monthold is a child of [place] and [place]  The patient attends a local [place] Not in place   HEALTH MAINTENANCE: Social History   Tobacco Use  . Smoking status: Never Smoker  . Smokeless tobacco: Never Used  Vaping Use  . Vaping Use: Never used  Substance Use Topics  . Alcohol use: Never  . Drug use: Never     Colonoscopy: n/a (age)  PAP: 10/2018, negative  Bone density: n/a (age)   No Known Allergies  Current Outpatient Medications  Medication Sig Dispense Refill  . acetaminophen (TYLENOL) 500 MG tablet Take 500 mg by mouth every 6 (six) hours as needed for moderate pain or headache.    . calcium carbonate (TUMS) 500 MG chewable tablet Chew 1 tablet (200 mg of elemental calcium total) by mouth 3 (three) times daily as needed for indigestion or heartburn. 90 tablet 0  . dexamethasone (DECADRON) 4 MG tablet Take 2 tablets (8 mg total) by mouth 2 (two) times daily. Take decadron  Twice daily the day before taxotere then daily x 3 days after carboplatin  chemo 30 tablet 1  . HYDROcodone-acetaminophen  (NORCO/VICODIN) 5-325 MG tablet Take 1-2 tablets by mouth every 6 (six) hours as needed for moderate pain or severe pain. 15 tablet 0  . ibuprofen (ADVIL) 200 MG tablet Take 400 mg by mouth every 6 (six) hours as needed for headache or moderate pain.    .Marland Kitchenlidocaine-prilocaine (EMLA) cream Apply to affected area once 30 g 3  . loratadine (CLARITIN) 10 MG tablet Take 1 tablet (10 mg total) by mouth daily. 60 tablet 6  . methocarbamol (ROBAXIN) 750 MG tablet Take 1 tablet (750 mg total) by mouth 4 (four) times daily as needed (use for muscle cramps/pain). 30 tablet 2  . omeprazole (PRILOSEC) 40 MG capsule Take 1 capsule (40 mg total) by mouth daily. 30 capsule 0  . prochlorperazine (COMPAZINE) 10 MG tablet Take 1 tablet (10 mg total) by mouth every 6 (six) hours as needed (Nausea or vomiting). 30 tablet 1  . valACYclovir (VALTREX) 1000 MG tablet Take 1 tablet (1,000 mg total) by mouth daily. Take days 1 through 7 of each chemotherapy cycle 30 tablet 0   No current facility-administered medications for this visit.    OBJECTIVE: Spanish  speaker who appears stated age  45:   08/24/20 0926  BP: 110/83  Pulse: 92  Resp: 18  Temp: 98.1 F (36.7 C)  SpO2: 100%     Body mass index is 36.16 kg/m.   Wt Readings from Last 3 Encounters:  08/24/20 173 lb (78.5 kg)  08/03/20 170 lb 9.6 oz (77.4 kg)  07/21/20 165 lb (74.8 kg)      ECOG FS:1 - Symptomatic but completely ambulatory  Sclerae unicteric, EOMs intact Wearing a mask No cervical or supraclavicular adenopathy Lungs no rales or rhonchi Heart regular rate and rhythm Abd soft, nontender, positive bowel sounds Rectal area: Small external hemorrhoids, not inflamed; no pain to perianal palpation, discomfort on palpation of the tailbone area MSK no focal spinal tenderness, no upper extremity lymphedema Neuro: nonfocal, well oriented, appropriate affect Breasts: The right breast is status post lumpectomy and receiving radiation.  There is  minimal erythema.  The left breast is benign.   LAB RESULTS:  CMP     Component Value Date/Time   NA 140 08/24/2020 0912   NA 138 12/15/2019 1543   K 4.1 08/24/2020 0912   CL 107 08/24/2020 0912   CO2 26 08/24/2020 0912   GLUCOSE 115 (H) 08/24/2020 0912   BUN 11 08/24/2020 0912   BUN 10 12/15/2019 1543   CREATININE 0.68 08/24/2020 0912   CREATININE 0.77 02/22/2020 1353   CALCIUM 9.1 08/24/2020 0912   PROT 6.7 08/24/2020 0912   PROT 7.3 12/15/2019 1543   ALBUMIN 3.9 08/24/2020 0912   ALBUMIN 4.5 12/15/2019 1543   AST 29 08/24/2020 0912   AST 13 (L) 02/22/2020 1353   ALT 32 08/24/2020 0912   ALT 8 02/22/2020 1353   ALKPHOS 117 08/24/2020 0912   BILITOT 0.4 08/24/2020 0912   BILITOT 0.3 02/22/2020 1353   GFRNONAA >60 08/24/2020 0912   GFRNONAA >60 02/22/2020 1353   GFRAA >60 03/28/2020 0813   GFRAA >60 02/22/2020 1353    No results found for: TOTALPROTELP, ALBUMINELP, A1GS, A2GS, BETS, BETA2SER, GAMS, MSPIKE, SPEI  Lab Results  Component Value Date   WBC 3.5 (L) 08/24/2020   NEUTROABS 1.5 (L) 08/24/2020   HGB 13.0 08/24/2020   HCT 38.5 08/24/2020   MCV 97.5 08/24/2020   PLT 326 08/24/2020    No results found for: LABCA2  No components found for: PNTIRW431  No results for input(s): INR in the last 168 hours.  No results found for: LABCA2  No results found for: VQM086  No results found for: PYP950  No results found for: DTO671  No results found for: CA2729  No components found for: HGQUANT  No results found for: CEA1 / No results found for: CEA1   No results found for: AFPTUMOR  No results found for: CHROMOGRNA  No results found for: KPAFRELGTCHN, LAMBDASER, KAPLAMBRATIO (kappa/lambda light chains)  Lab Results  Component Value Date   HGBA 97.0 10/10/2018   (Hemoglobinopathy evaluation)   No results found for: LDH  No results found for: IRON, TIBC, IRONPCTSAT (Iron and TIBC)  No results found for: FERRITIN  Urinalysis    Component  Value Date/Time   COLORURINE YELLOW 12/31/2017 2039   APPEARANCEUR CLOUDY (A) 12/31/2017 2039   LABSPEC 1.015 12/31/2017 2039   PHURINE 5.0 12/31/2017 2039   GLUCOSEU NEGATIVE 12/31/2017 2039   HGBUR MODERATE (A) 12/31/2017 2039   [place] NEGATIVE 12/31/2017 2039   [place] NEGATIVE 12/31/2017 2039   PROTEINUR NEGATIVE 12/31/2017 2039   NITRITE NEGATIVE 12/31/2017 2039  LEUKOCYTESUR LARGE (A) 12/31/2017 2039    STUDIES: No results found.   ELIGIBLE FOR AVAILABLE RESEARCH PROTOCOL: AET  ASSESSMENT: 45 y.o. [place] woman status post right breast upper outer quadrant biopsy 02/04/2020 for a clinical T2 N0, stage IB invasive ductal carcinoma, grade 2, triple positive, with an MIB-1 of 20%  (1) status post right mastectomy and sentinel lymph node sampling 03/07/2020 for a pT2 pN1, stage IB invasive ductal carcinoma, grade 2, with negative margins.  (a) a total of 4 lymph nodes were removed, one positive  (2) adjuvant chemo immunotherapy will consist of carboplatin, docetaxel, trastuzumab and pertuzumab every 21 days x 6 starting 03/29/2020, completed 07/18/2020  (a) pertuzumab discontinued after cycle 2 secondary to diarrhea  (3) trastuzumab to continue to complete a year  (a) echo 03/18/2020 shows an ejection fraction in the 60-65% range  (b) echo 06/29/2020 showed an EF in the 65-70% range  (4) adjuvant radiation to follow  (5) genetics testing 03/02/2020 through the [facility] Common Hereditary Cancers Panel. . The Common Hereditary Cancers Panel found no deleterious mutations in APC, ATM, AXIN2, BARD1, BMPR1A, BRCA1, BRCA2, BRIP1, CDH1, CDK4, CDKN2A (p14ARF), CDKN2A (p16INK4a), CHEK2, CTNNA1, DICER1, EPCAM (Deletion/duplication testing only), GREM1 (promoter region deletion/duplication testing only), KIT, MEN1, MLH1, MSH2, MSH3, MSH6, MUTYH, NBN, NF1, NHTL1, PALB2, PDGFRA, PMS2, POLD1, POLE, PTEN, RAD50, RAD51C, RAD51D, RNF43, SDHB, SDHC, SDHD, SMAD4, SMARCA4. STK11, TP53,  TSC1, TSC2, and VHL.  The following genes were evaluated for sequence changes only: SDHA and HOXB13 c.251G>A variant only.   (a)  a Variant of uncertain significance (VUS) in [place] [facility] [place] at c.3380T>G (p.Ile1127Ser) was noted  (6) tamoxifen started 02/12/2020 in anticipation of possible surgical delays, held during chemotherapy   PLAN: Nohea is continuing on trastuzumab with good tolerance and will be up for repeat echocardiography in April.  She is also tolerating radiation well.  Once she completes her radiation treatments she will go back on tamoxifen.  I do not have a simple explanation for what she is experiencing.  I do not think it is going to be hemorrhoids.  I am going to obtain a CT scan of the abdomen and pelvis and a chest x-ray and I alerted her that she will need to drink some contrast for this study.  Otherwise she will see me again in 3 weeks.  She knows to call for any other issue that may develop before then  Total encounter time 25 minutes.Sarajane Jews C. Jaizon Deroos, MD 08/24/20 9:51 AM Medical Oncology and Hematology [facility] [place], [place] [address] Tel. [phone]    Fax. [phone]   I, Wilburn Mylar, am acting as scribe for Dr. Virgie Dad. Chrisy Hillebrand.  I, Lurline Del MD, have reviewed the above documentation for accuracy and completeness, and I agree with the above.   *Total Encounter Time as defined by the Centers for Medicare and Medicaid Services includes, in addition to the face-to-face time of a patient visit (documented in the note above) non-face-to-face time: obtaining and reviewing outside history, ordering and reviewing medications, tests or procedures, care coordination (communications with other health care professionals or caregivers) and documentation in the medical record.

## 2020-08-24 ENCOUNTER — Inpatient Hospital Stay (HOSPITAL_BASED_OUTPATIENT_CLINIC_OR_DEPARTMENT_OTHER): Payer: No Typology Code available for payment source | Admitting: Oncology

## 2020-08-24 ENCOUNTER — Inpatient Hospital Stay: Payer: No Typology Code available for payment source

## 2020-08-24 ENCOUNTER — Inpatient Hospital Stay: Payer: No Typology Code available for payment source | Attending: Oncology

## 2020-08-24 ENCOUNTER — Other Ambulatory Visit: Payer: Self-pay

## 2020-08-24 ENCOUNTER — Ambulatory Visit
Admission: RE | Admit: 2020-08-24 | Discharge: 2020-08-24 | Disposition: A | Discharge status: Home / Self Care | Payer: No Typology Code available for payment source | Source: Ambulatory Visit | Attending: Radiation Oncology | Admitting: Radiation Oncology

## 2020-08-24 VITALS — BP 110/83 | HR 92 | Temp 98.1°F | Resp 18 | Ht <= 58 in | Wt 173.0 lb

## 2020-08-24 DIAGNOSIS — Z170 Estrogen receptor positive status [ER+]: Secondary | ICD-10-CM | POA: Insufficient documentation

## 2020-08-24 DIAGNOSIS — Z7952 Long term (current) use of systemic steroids: Secondary | ICD-10-CM | POA: Insufficient documentation

## 2020-08-24 DIAGNOSIS — Z79899 Other long term (current) drug therapy: Secondary | ICD-10-CM | POA: Insufficient documentation

## 2020-08-24 DIAGNOSIS — Z8249 Family history of ischemic heart disease and other diseases of the circulatory system: Secondary | ICD-10-CM | POA: Insufficient documentation

## 2020-08-24 DIAGNOSIS — Z95828 Presence of other vascular implants and grafts: Secondary | ICD-10-CM

## 2020-08-24 DIAGNOSIS — C50411 Malignant neoplasm of upper-outer quadrant of right female breast: Secondary | ICD-10-CM

## 2020-08-24 DIAGNOSIS — R102 Pelvic and perineal pain: Secondary | ICD-10-CM | POA: Insufficient documentation

## 2020-08-24 DIAGNOSIS — Z9011 Acquired absence of right breast and nipple: Secondary | ICD-10-CM | POA: Insufficient documentation

## 2020-08-24 DIAGNOSIS — M549 Dorsalgia, unspecified: Secondary | ICD-10-CM | POA: Insufficient documentation

## 2020-08-24 DIAGNOSIS — Z5112 Encounter for antineoplastic immunotherapy: Secondary | ICD-10-CM | POA: Insufficient documentation

## 2020-08-24 DIAGNOSIS — Z833 Family history of diabetes mellitus: Secondary | ICD-10-CM | POA: Insufficient documentation

## 2020-08-24 LAB — CBC WITH DIFFERENTIAL/PLATELET
Abs Immature Granulocytes: 0 10*3/uL (ref 0.00–0.07)
Basophils Absolute: 0 10*3/uL (ref 0.0–0.1)
Basophils Relative: 1 %
Eosinophils Absolute: 0.1 10*3/uL (ref 0.0–0.5)
Eosinophils Relative: 1 %
HCT: 38.5 % (ref 36.0–46.0)
Hemoglobin: 13 g/dL (ref 12.0–15.0)
Immature Granulocytes: 0 %
Lymphocytes Relative: 46 %
Lymphs Abs: 1.6 10*3/uL (ref 0.7–4.0)
MCH: 32.9 pg (ref 26.0–34.0)
MCHC: 33.8 g/dL (ref 30.0–36.0)
MCV: 97.5 fL (ref 80.0–100.0)
Monocytes Absolute: 0.3 10*3/uL (ref 0.1–1.0)
Monocytes Relative: 10 %
Neutro Abs: 1.5 10*3/uL — ABNORMAL LOW (ref 1.7–7.7)
Neutrophils Relative %: 42 %
Platelets: 326 10*3/uL (ref 150–400)
RBC: 3.95 MIL/uL (ref 3.87–5.11)
RDW: 12.2 % (ref 11.5–15.5)
WBC: 3.5 10*3/uL — ABNORMAL LOW (ref 4.0–10.5)
nRBC: 0 % (ref 0.0–0.2)

## 2020-08-24 LAB — COMPREHENSIVE METABOLIC PANEL
ALT: 32 U/L (ref 0–44)
AST: 29 U/L (ref 15–41)
Albumin: 3.9 g/dL (ref 3.5–5.0)
Alkaline Phosphatase: 117 U/L (ref 38–126)
Anion gap: 7 (ref 5–15)
BUN: 11 mg/dL (ref 6–20)
CO2: 26 mmol/L (ref 22–32)
Calcium: 9.1 mg/dL (ref 8.9–10.3)
Chloride: 107 mmol/L (ref 98–111)
Creatinine, Ser: 0.68 mg/dL (ref 0.44–1.00)
GFR, Estimated: >60 mL/min (ref >60)
Glucose, Bld: 115 mg/dL — ABNORMAL HIGH (ref 70–99)
Potassium: 4.1 mmol/L (ref 3.5–5.1)
Sodium: 140 mmol/L (ref 135–145)
Total Bilirubin: 0.4 mg/dL (ref 0.3–1.2)
Total Protein: 6.7 g/dL (ref 6.5–8.1)

## 2020-08-24 MED ORDER — HEPARIN SOD (PORK) LOCK FLUSH 100 UNIT/ML IV SOLN
500.0000 [IU] | Freq: Once | INTRAVENOUS | Status: AC | PRN
  Administered 2020-08-24: 500 [IU]
  Filled 2020-08-24: qty 5

## 2020-08-24 MED ORDER — TRASTUZUMAB-ANNS CHEMO 150 MG IV SOLR
6.0000 mg/kg | Freq: Once | INTRAVENOUS | Status: AC
  Administered 2020-08-24: 420 mg via INTRAVENOUS
  Filled 2020-08-24: qty 20

## 2020-08-24 MED ORDER — SODIUM CHLORIDE 0.9% FLUSH
10.0000 mL | INTRAVENOUS | Status: DC | PRN
  Administered 2020-08-24: 10 mL
  Filled 2020-08-24: qty 10

## 2020-08-24 MED ORDER — SODIUM CHLORIDE 0.9% FLUSH
10.0000 mL | Freq: Once | INTRAVENOUS | Status: AC
  Administered 2020-08-24: 10 mL
  Filled 2020-08-24: qty 10

## 2020-08-24 MED ORDER — DIPHENHYDRAMINE HCL 25 MG PO CAPS
ORAL_CAPSULE | ORAL | Status: AC
  Filled 2020-08-24: qty 1

## 2020-08-24 MED ORDER — DIPHENHYDRAMINE HCL 25 MG PO CAPS
25.0000 mg | ORAL_CAPSULE | Freq: Once | ORAL | Status: AC
  Administered 2020-08-24: 25 mg via ORAL

## 2020-08-24 MED ORDER — ACETAMINOPHEN 325 MG PO TABS
650.0000 mg | ORAL_TABLET | Freq: Once | ORAL | Status: AC
  Administered 2020-08-24: 650 mg via ORAL

## 2020-08-24 MED ORDER — SODIUM CHLORIDE 0.9 % IV SOLN
Freq: Once | INTRAVENOUS | Status: AC
  Filled 2020-08-24: qty 250

## 2020-08-24 MED ORDER — ACETAMINOPHEN 325 MG PO TABS
ORAL_TABLET | ORAL | Status: AC
  Filled 2020-08-24: qty 2

## 2020-08-24 NOTE — Patient Instructions (Signed)
[facility] Discharge Instructions for Patients Receiving Chemotherapy  Today you received the following chemotherapy agents trastuzumab.  To help prevent nausea and vomiting after your treatment, we encourage you to take your nausea medication as directed.    If you develop nausea and vomiting that is not controlled by your nausea medication, call the clinic.   BELOW ARE SYMPTOMS THAT SHOULD BE REPORTED IMMEDIATELY:  *FEVER GREATER THAN 100.5 F  *CHILLS WITH OR WITHOUT FEVER  NAUSEA AND VOMITING THAT IS NOT CONTROLLED WITH YOUR NAUSEA MEDICATION  *UNUSUAL SHORTNESS OF BREATH  *UNUSUAL BRUISING OR BLEEDING  TENDERNESS IN MOUTH AND THROAT WITH OR WITHOUT PRESENCE OF ULCERS  *URINARY PROBLEMS  *BOWEL PROBLEMS  UNUSUAL RASH Items with * indicate a potential emergency and should be followed up as soon as possible.  Feel free to call the clinic should you have any questions or concerns. The clinic phone number is [phone].  Please show the CHEMO ALERT CARD at check-in to the Emergency Department and triage nurse.   

## 2020-08-24 NOTE — Patient Instructions (Signed)
Implanted Port Insertion, Care After This sheet gives you information about how to care for yourself after your procedure. Your health care provider may also give you more specific instructions. If you have problems or questions, contact your health care provider. What can I expect after the procedure? After the procedure, it is common to have:  Discomfort at the port insertion site.  Bruising on the skin over the port. This should improve over 3-4 days. Follow these instructions at home: Port care  After your port is placed, you will get a manufacturer's information card. The card has information about your port. Keep this card with you at all times.  Take care of the port as told by your health care provider. Ask your health care provider if you or a family member can get training for taking care of the port at home. A home health care nurse may also take care of the port.  Make sure to remember what type of port you have. Incision care  Follow instructions from your health care provider about how to take care of your port insertion site. Make sure you: ? Wash your hands with soap and water before and after you change your bandage (dressing). If soap and water are not available, use hand sanitizer. ? Change your dressing as told by your health care provider. ? Leave stitches (sutures), skin glue, or adhesive strips in place. These skin closures may need to stay in place for 2 weeks or longer. If adhesive strip edges start to loosen and curl up, you may trim the loose edges. Do not remove adhesive strips completely unless your health care provider tells you to do that.  Check your port insertion site every day for signs of infection. Check for: ? Redness, swelling, or pain. ? Fluid or blood. ? Warmth. ? Pus or a bad smell.      Activity  Return to your normal activities as told by your health care provider. Ask your health care provider what activities are safe for you.  Do not  lift anything that is heavier than 10 lb (4.5 kg), or the limit that you are told, until your health care provider says that it is safe. General instructions  Take over-the-counter and prescription medicines only as told by your health care provider.  Do not take baths, swim, or use a hot tub until your health care provider approves. Ask your health care provider if you may take showers. You may only be allowed to take sponge baths.  Do not drive for 24 hours if you were given a sedative during your procedure.  Wear a medical alert bracelet in case of an emergency. This will tell any health care providers that you have a port.  Keep all follow-up visits as told by your health care provider. This is important. Contact a health care provider if:  You cannot flush your port with saline as directed, or you cannot draw blood from the port.  You have a fever or chills.  You have redness, swelling, or pain around your port insertion site.  You have fluid or blood coming from your port insertion site.  Your port insertion site feels warm to the touch.  You have pus or a bad smell coming from the port insertion site. Get help right away if:  You have chest pain or shortness of breath.  You have bleeding from your port that you cannot control. Summary  Take care of the port as told by your   health care provider. Keep the manufacturer's information card with you at all times.  Change your dressing as told by your health care provider.  Contact a health care provider if you have a fever or chills or if you have redness, swelling, or pain around your port insertion site.  Keep all follow-up visits as told by your health care provider. This information is not intended to replace advice given to you by your health care provider. Make sure you discuss any questions you have with your health care provider. Document Revised: 01/07/2018 Document Reviewed: 01/07/2018 Elsevier Patient Education   2021 Elsevier Inc.  

## 2020-08-24 NOTE — Progress Notes (Signed)
Spoke w/ MD Magrinat and he would like to continue patient on Kanjinti 420 mg   Larene Beach, PharmD, RPh

## 2020-08-25 ENCOUNTER — Ambulatory Visit
Admission: RE | Admit: 2020-08-25 | Discharge: 2020-08-25 | Disposition: A | Discharge status: Home / Self Care | Payer: No Typology Code available for payment source | Source: Ambulatory Visit | Attending: Radiation Oncology | Admitting: Radiation Oncology

## 2020-08-25 ENCOUNTER — Other Ambulatory Visit: Payer: Self-pay

## 2020-08-25 ENCOUNTER — Telehealth: Payer: Self-pay | Admitting: Oncology

## 2020-08-25 NOTE — Telephone Encounter (Signed)
Scheduled appts per 3/2 los. [facility] interpreter Almyra Free will call pt to confirm appts added.

## 2020-08-26 ENCOUNTER — Encounter: Payer: Self-pay | Admitting: *Deleted

## 2020-08-26 ENCOUNTER — Ambulatory Visit
Admission: RE | Admit: 2020-08-26 | Discharge: 2020-08-26 | Disposition: A | Discharge status: Home / Self Care | Payer: No Typology Code available for payment source | Source: Ambulatory Visit | Attending: Radiation Oncology | Admitting: Radiation Oncology

## 2020-08-26 ENCOUNTER — Ambulatory Visit: Payer: No Typology Code available for payment source

## 2020-08-26 ENCOUNTER — Other Ambulatory Visit: Payer: Self-pay

## 2020-08-29 ENCOUNTER — Other Ambulatory Visit: Payer: Self-pay

## 2020-08-29 ENCOUNTER — Ambulatory Visit
Admission: RE | Admit: 2020-08-29 | Discharge: 2020-08-29 | Disposition: A | Discharge status: Home / Self Care | Payer: No Typology Code available for payment source | Source: Ambulatory Visit | Attending: Radiation Oncology | Admitting: Radiation Oncology

## 2020-08-30 ENCOUNTER — Ambulatory Visit
Admission: RE | Admit: 2020-08-30 | Discharge: 2020-08-30 | Disposition: A | Discharge status: Home / Self Care | Payer: No Typology Code available for payment source | Source: Ambulatory Visit | Attending: Radiation Oncology | Admitting: Radiation Oncology

## 2020-08-30 ENCOUNTER — Other Ambulatory Visit: Payer: Self-pay

## 2020-08-31 ENCOUNTER — Ambulatory Visit
Admission: RE | Admit: 2020-08-31 | Discharge: 2020-08-31 | Disposition: A | Discharge status: Home / Self Care | Payer: No Typology Code available for payment source | Source: Ambulatory Visit | Attending: Radiation Oncology | Admitting: Radiation Oncology

## 2020-08-31 ENCOUNTER — Other Ambulatory Visit: Payer: Self-pay

## 2020-09-01 ENCOUNTER — Ambulatory Visit
Admission: RE | Admit: 2020-09-01 | Discharge: 2020-09-01 | Disposition: A | Discharge status: Home / Self Care | Payer: No Typology Code available for payment source | Source: Ambulatory Visit | Attending: Radiation Oncology | Admitting: Radiation Oncology

## 2020-09-02 ENCOUNTER — Ambulatory Visit
Admission: RE | Admit: 2020-09-02 | Discharge: 2020-09-02 | Disposition: A | Discharge status: Home / Self Care | Payer: No Typology Code available for payment source | Source: Ambulatory Visit | Attending: Radiation Oncology | Admitting: Radiation Oncology

## 2020-09-05 ENCOUNTER — Other Ambulatory Visit: Payer: Self-pay

## 2020-09-05 ENCOUNTER — Ambulatory Visit
Admission: RE | Admit: 2020-09-05 | Discharge: 2020-09-05 | Disposition: A | Discharge status: Home / Self Care | Payer: No Typology Code available for payment source | Source: Ambulatory Visit | Attending: Radiation Oncology | Admitting: Radiation Oncology

## 2020-09-06 ENCOUNTER — Encounter (HOSPITAL_COMMUNITY): Payer: Self-pay

## 2020-09-06 ENCOUNTER — Ambulatory Visit
Admission: RE | Admit: 2020-09-06 | Discharge: 2020-09-06 | Disposition: A | Discharge status: Home / Self Care | Payer: No Typology Code available for payment source | Source: Ambulatory Visit | Attending: Radiation Oncology | Admitting: Radiation Oncology

## 2020-09-06 ENCOUNTER — Ambulatory Visit (HOSPITAL_COMMUNITY)
Admission: RE | Admit: 2020-09-06 | Discharge: 2020-09-06 | Disposition: A | Discharge status: Home / Self Care | Payer: Self-pay | Source: Ambulatory Visit | Attending: Oncology | Admitting: Oncology

## 2020-09-06 DIAGNOSIS — C50411 Malignant neoplasm of upper-outer quadrant of right female breast: Secondary | ICD-10-CM | POA: Insufficient documentation

## 2020-09-06 DIAGNOSIS — Z170 Estrogen receptor positive status [ER+]: Secondary | ICD-10-CM | POA: Insufficient documentation

## 2020-09-06 IMAGING — CT CT ABD-PELV W/ CM
2 of 5 series · 15 of 46 positions shown, 17 images · IV contrast (APPLIED)
Comparison: None.

CLINICAL DATA: Right-sided breast cancer completed chemotherapy,
currently undergoing radiation therapy.

EXAM:
CT ABDOMEN AND PELVIS WITH CONTRAST
TECHNIQUE: Multidetector CT imaging of the abdomen and pelvis was performed
using the standard protocol following bolus administration of
intravenous contrast.
CONTRAST:  100mL OMNIPAQUE IOHEXOL 300 MG/ML  SOLN

[Series 2: axial st · axial · 0.80mm/px · z∈[+1142,+1562]mm · 12 of 99 slices shown, 14 images]
[im 8/99  soft-tissue]
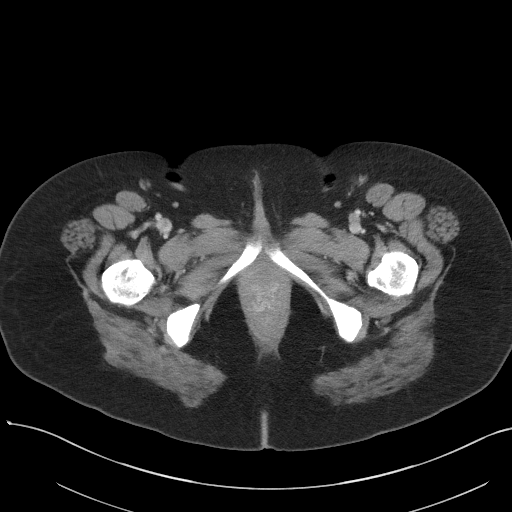
[im 8/99  bone]
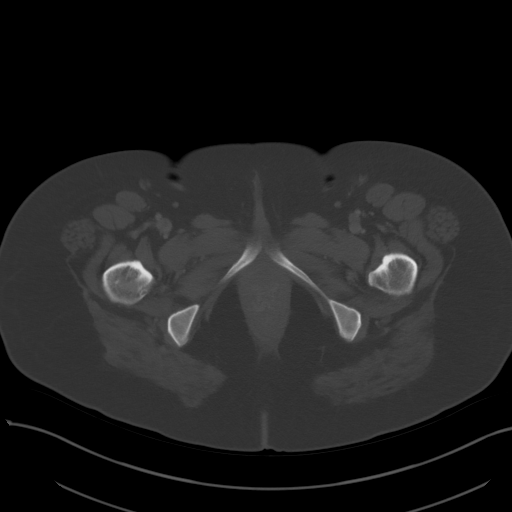
[im 15/99  soft-tissue]
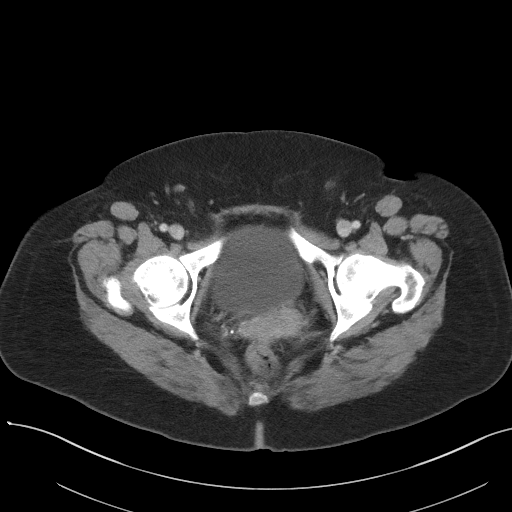
[im 22/99  soft-tissue]
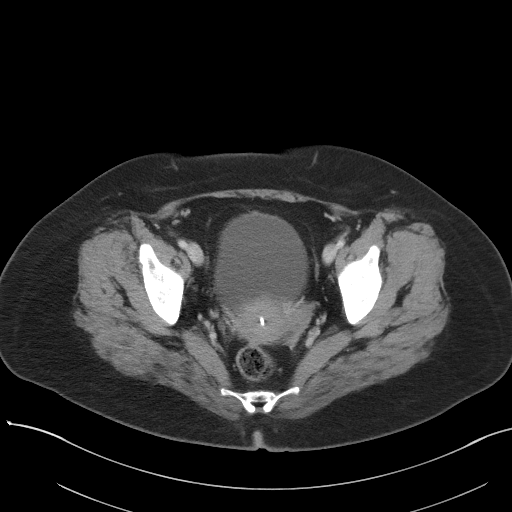
[im 29/99  soft-tissue]
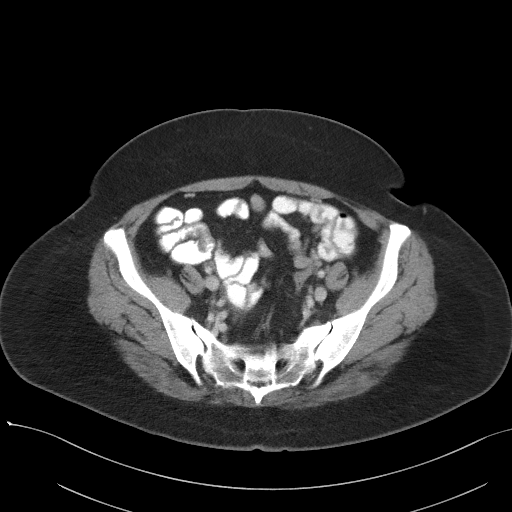
[im 36/99  soft-tissue]
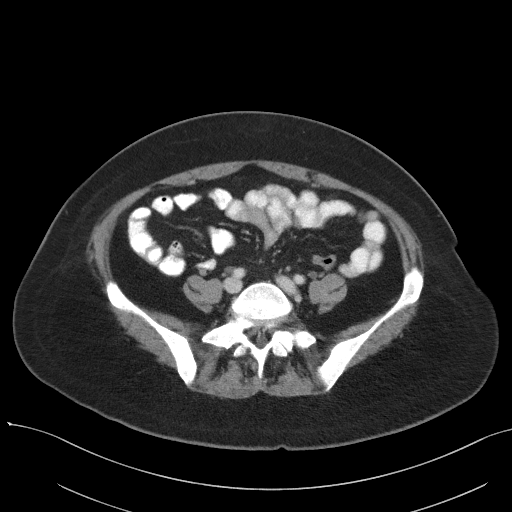
[im 43/99  soft-tissue]
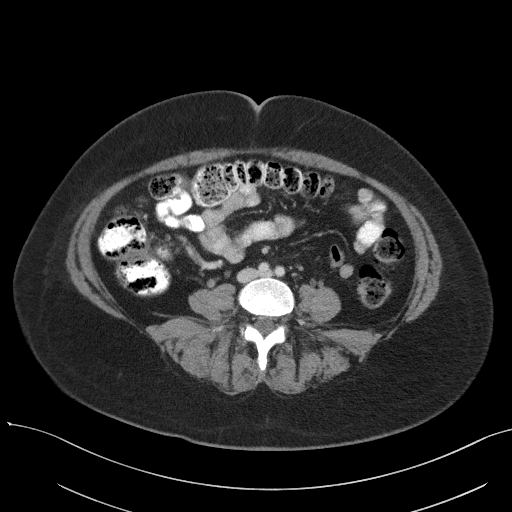
[im 57/99  soft-tissue]
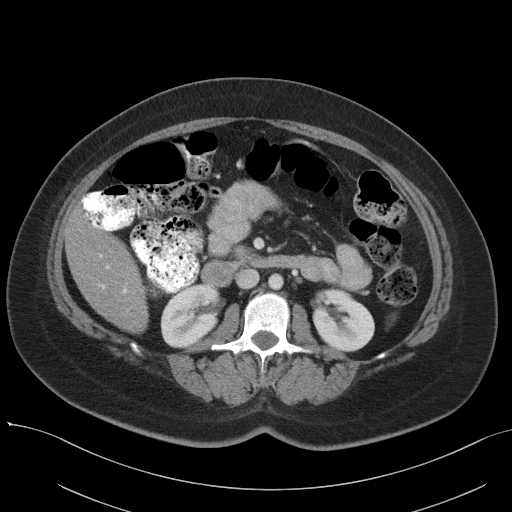
[im 64/99  soft-tissue]
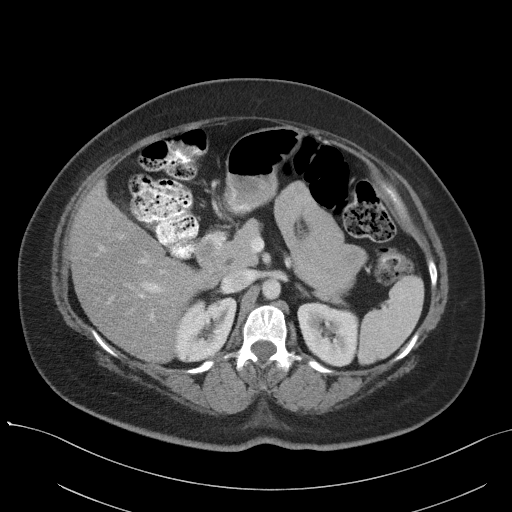
[im 71/99  soft-tissue]
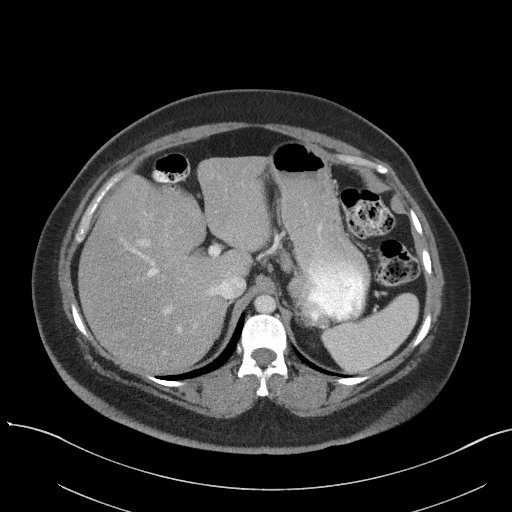
[im 71/99  bone]
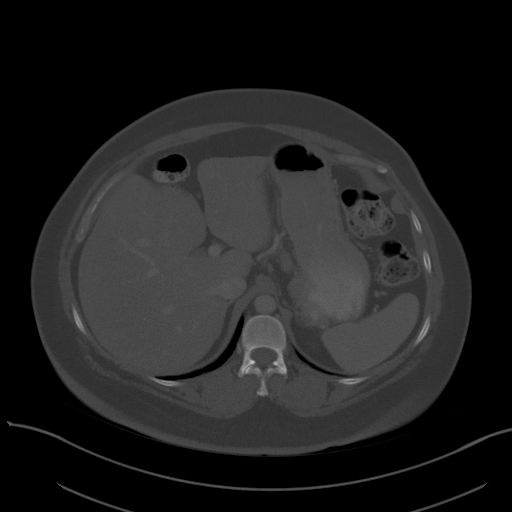
[im 78/99  soft-tissue]
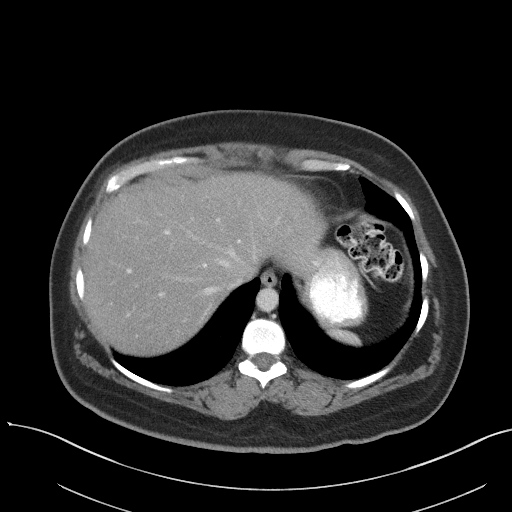
[im 85/99  soft-tissue]
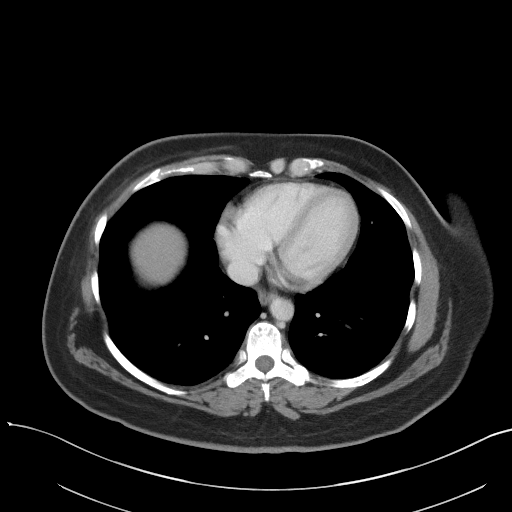
[im 92/99  soft-tissue]
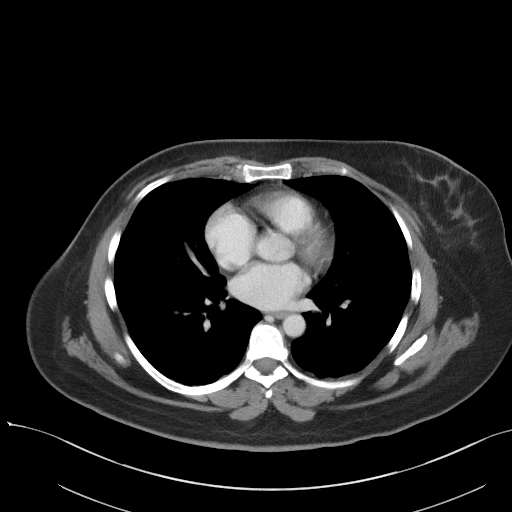

[Series 5: coronal st · coronal · 0.79mm/px · 3 of 104 slices shown]
[im 35/104  soft-tissue]
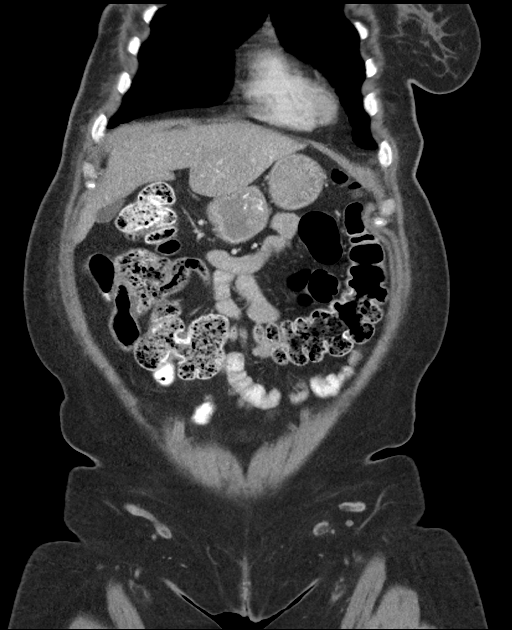
[im 46/104  soft-tissue]
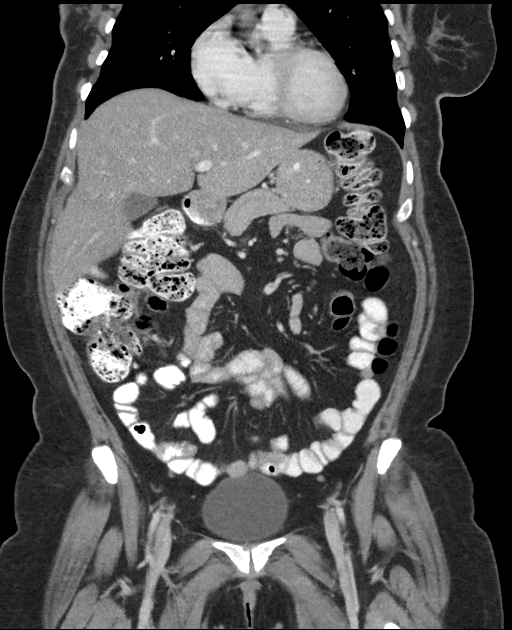
[im 58/104  soft-tissue]
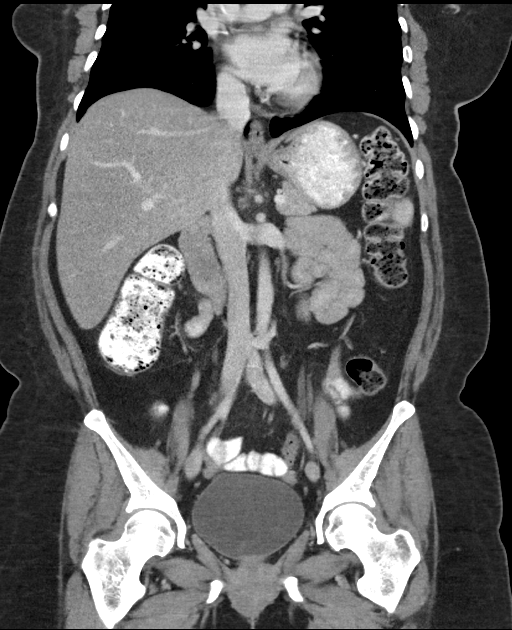

[15 of 46 positions shown; findings below may reference images not displayed]

FINDINGS: Lower chest: Partially visualized central venous catheter with tip
at the superior cavoatrial junction. Postsurgical/treatment changes
in the right breast with skin thickening subcutaneous edema and a
partially visualized fluid collection in the right lateral chest
wall/axilla on image [DATE].

There are few tiny 4 mm or smaller bilateral pulmonary nodules. For
instance there is a 4 mm right middle lobe pulmonary nodule image
[DATE] and a 2 mm left lower lobe pulmonary nodule on image 35/4.

Hepatobiliary: Hypodense 4 mm lesion in the left lobe of the liver
image [DATE]. No arterially enhancing hepatic lesions. Gallbladder is
unremarkable. No biliary ductal dilation.

Pancreas: Unremarkable

Spleen: Unremarkable

Adrenals/Urinary Tract: Bilateral adrenal glands are unremarkable.

No hydronephrosis. Symmetric enhancement and excretion the bilateral
kidneys. No suspicious filling defect visualized within the
opacified portions of the collecting system or ureters on delayed
imaging. No nephrolithiasis. No solid enhancing renal lesions.

Urinary bladder is grossly unremarkable for degree of distension.

Stomach/Bowel: Enteric radiopaque contrast visualized to the level
of the transverse colon. Stomach is grossly unremarkable. Small
duodenal diverticulum. Normal positioning of the duodenum/ligament
of [place]. No suspicious small bowel wall thickening or dilation. No
suspicious colonic wall thickening or mass like lesions.

Vascular/Lymphatic: No significant vascular findings are present. No
enlarged abdominal or pelvic lymph nodes.

Reproductive: IUD in place.  No suspicious adnexal masses.

Other: No abdominopelvic ascites.

Musculoskeletal: Sclerosis along the bilateral iliac bones at the SI
joints without significant sacral involvement. No suspicious lytic
or blastic lesions of bone. No acute osseous abnormality
IMPRESSION: 1. Postsurgical/treatment changes in the right breast with a
partially visualized fluid collection in the right lateral chest
wall/axilla, favor seroma.
2. There are few tiny 4 mm or smaller bilateral pulmonary nodules,
which are nonspecific. Attention on short interval follow-up with
dedicated chest CT in 3 months is suggestive.
3. Hypodense 4 mm lesion in the left lobe of the liver, too small to
accurately characterize. Attention on follow-up imaging is
recommended.

## 2020-09-06 MED ORDER — HEPARIN SOD (PORK) LOCK FLUSH 100 UNIT/ML IV SOLN
500.0000 [IU] | Freq: Once | INTRAVENOUS | Status: AC
  Administered 2020-09-06: 500 [IU] via INTRAVENOUS

## 2020-09-06 MED ORDER — HEPARIN SOD (PORK) LOCK FLUSH 100 UNIT/ML IV SOLN
INTRAVENOUS | Status: AC
  Filled 2020-09-06: qty 5

## 2020-09-06 MED ORDER — IOHEXOL 300 MG/ML  SOLN
100.0000 mL | Freq: Once | INTRAMUSCULAR | Status: AC | PRN
  Administered 2020-09-06: 100 mL via INTRAVENOUS

## 2020-09-07 ENCOUNTER — Ambulatory Visit
Admission: RE | Admit: 2020-09-07 | Discharge: 2020-09-07 | Disposition: A | Discharge status: Home / Self Care | Payer: No Typology Code available for payment source | Source: Ambulatory Visit | Attending: Radiation Oncology | Admitting: Radiation Oncology

## 2020-09-07 ENCOUNTER — Other Ambulatory Visit: Payer: Self-pay

## 2020-09-08 ENCOUNTER — Ambulatory Visit
Admission: RE | Admit: 2020-09-08 | Discharge: 2020-09-08 | Disposition: A | Discharge status: Home / Self Care | Payer: No Typology Code available for payment source | Source: Ambulatory Visit | Attending: Radiation Oncology | Admitting: Radiation Oncology

## 2020-09-09 ENCOUNTER — Ambulatory Visit
Admission: RE | Admit: 2020-09-09 | Discharge: 2020-09-09 | Disposition: A | Discharge status: Home / Self Care | Payer: No Typology Code available for payment source | Source: Ambulatory Visit | Attending: Radiation Oncology | Admitting: Radiation Oncology

## 2020-09-12 ENCOUNTER — Other Ambulatory Visit: Payer: Self-pay

## 2020-09-12 ENCOUNTER — Ambulatory Visit
Admission: RE | Admit: 2020-09-12 | Discharge: 2020-09-12 | Disposition: A | Discharge status: Home / Self Care | Payer: No Typology Code available for payment source | Source: Ambulatory Visit | Attending: Radiation Oncology | Admitting: Radiation Oncology

## 2020-09-13 ENCOUNTER — Encounter: Payer: Self-pay | Admitting: Oncology

## 2020-09-13 ENCOUNTER — Ambulatory Visit
Admission: RE | Admit: 2020-09-13 | Discharge: 2020-09-13 | Disposition: A | Discharge status: Home / Self Care | Payer: No Typology Code available for payment source | Source: Ambulatory Visit | Attending: Radiation Oncology | Admitting: Radiation Oncology

## 2020-09-13 NOTE — Progress Notes (Signed)
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  Placed in Interoffice mail to [place]: Customer Service to be processed.  Patient will receive a letter of determination in about 3 weeks or if any additional information is needed.

## 2020-09-13 NOTE — Progress Notes (Signed)
[name]  Telephone:([phone]) [phone] Fax:(336) [phone]     ID: Jill [name] DOB: 1975-08-25  MR#: [id number]  NLG#:[id number]  Patient Care Team: Gildardo Pounds, NP as PCP - General (Nurse Practitioner) Amaan Meyer, Virgie Dad, MD as Consulting Physician (Oncology) Jovita Kussmaul, MD as Consulting Physician (General Surgery) Dillingham, Loel Lofty, DO as Attending Physician (Plastic Surgery) Mauro Kaufmann, RN as Oncology Nurse Navigator Rockwell Germany, RN as Oncology Nurse Navigator Chauncey Cruel, MD OTHER MD:  CHIEF COMPLAINT: triple positive breast cancer (s/p right mastectomy)  CURRENT TREATMENT: Adjuvant radiation therapy; trastuzumab   INTERVAL HISTORY: Jill [name] returns today for follow up of her triple positive breast cancer.    Since her last visit, she underwent CT abdomen/pelvis on 09/06/2020 showing: partially-visualized fluid collection in right lateral chest wall/axilla, favor seroma; few tiny 4 mm or smaller nonspecific bilateral pulmonary nodules; hypodense 4 mm lesion in left lobe of liver, too small to accurately characterize.  She continues with adjuvant radiation and is scheduled to finish on 09/30/2020.  She continues on trastuzumab.  Most recent echocardiogram was 06/29/2020 and shows a well-preserved ejection fraction   REVIEW OF SYSTEMS: Jill [name] has some aches and pains in her bones as she puts it which may be residual from her chemotherapy.  She also has a pain in her bottom she says that does not let her sit for any length of time.  She has not had a Pap smear in some time.  Unfortunately I did not have time to do a pelvic exam today.  She tells me her appetite is down a bit although her sense of taste is better.  She has no diarrhea or constipation issues.  Aside from this she is tolerating treatment well and is having no side effects that she is aware of from the trastuzumab.   COVID 19 VACCINATION STATUS: fully vaccinated  [employer]), no booster as of December 2021   HISTORY OF CURRENT ILLNESS: From the original intake note:  "Jill [name]" herself palpated a right breast abnormality sometime in 2019.,  She did not pay much mind.  This was noted again during her pregnancy and she underwent bilateral diagnostic mammography with tomography and right breast ultrasonography at [facility] on 01/21/2020 showing: breast density category C; 4.1 cm irregular mass with associated group of pleomorphic calcifications measuring approximately 4.9 cm, located at site of palpable concern in upper-outer right breast at 9:30; no right axillary lymphadenopathy.  Accordingly on 02/04/2020 she proceeded to biopsy of the right breast area in question. The pathology from this procedure ([id number]) showed: invasive and in situ mammary carcinoma, e-cadherin positive, grade 2. Prognostic indicators significant for: estrogen receptor, 100% positive and progesterone receptor, 100% positive, both with strong staining intensity. Proliferation marker Ki67 at 20%. HER2 equivocal by immunohistochemistry (2+), but positive by fluorescent in situ hybridization with a signals ratio 5.39 and number per cell 11.05.  The patient's subsequent history is as detailed below.   PAST MEDICAL HISTORY: Past Medical History:  Diagnosis Date  . Breast cancer ([place]) 03/07/2020  . Infected cyst of Bartholin's gland duct   . Medical history non-contributory     PAST SURGICAL HISTORY: Past Surgical History:  Procedure Laterality Date  . IR IMAGING GUIDED PORT INSERTION  03/02/2020  . MASTECTOMY W/ SENTINEL NODE BIOPSY Right 03/07/2020   Procedure: RIGHT MASTECTOMY WITH SENTINEL LYMPH NODE BIOPSY;  Surgeon: Jovita Kussmaul, MD;  Location: [facility];  Service: General;  Laterality:  Right;  . NO PAST SURGERIES    . WISDOM TOOTH EXTRACTION      FAMILY HISTORY: Family History  Problem Relation Age of Onset  . Diabetes Maternal Grandmother   .  Hypertension Mother   The patient's father is 67 and her mother 34 as of August 2021.  The patient is 3 brothers and 2 sisters.  There is no history of cancer in the family to her knowledge   GYNECOLOGIC HISTORY:  No LMP recorded. (Menstrual status: IUD). Menarche: 45 years old Age at first live birth: 45 years old [place] P 4 LMP irregular Contraceptive: Mirena IUD in place HRT n/a  Hysterectomy? no BSO? 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  Her significant other [name] works Occupational hygienist.  They are both from [place].  The patient's children are ages 39, 82, 72, and 14 months as of August 2021.  The older 3 children are from a different father and their last name is [name].  (The patient was not married to Mr. [name] so there is no legal issue regarding access to medical records, etc.). The 56-monthold is a child of Roda and MReardan  The patient [place] a [place] Not in place   HEALTH MAINTENANCE: Social History   Tobacco Use  . Smoking status: Never Smoker  . Smokeless tobacco: Never Used  Vaping Use  . Vaping Use: Never used  Substance Use Topics  . Alcohol use: Never  . Drug use: Never     Colonoscopy: n/a (age)  PAP: 10/2018, negative  Bone density: n/a (age)   No Known Allergies  Current Outpatient Medications  Medication Sig Dispense Refill  . ibuprofen (ADVIL) 200 MG tablet Take 400 mg by mouth every 6 (six) hours as needed for headache or moderate pain.     No current facility-administered medications for this visit.    OBJECTIVE: Spanish speaker who appears stated age  V43   09/14/20 1153  BP: 111/71  Pulse: 80  Resp: 20  Temp: 97.9 F (36.6 C)  SpO2: 100%     Body mass index is 35.8 kg/m.   Wt Readings from Last 3 Encounters:  09/14/20 171 lb 4.8 oz (77.7 kg)  08/24/20  173 lb (78.5 kg)  08/03/20 170 lb 9.6 oz (77.4 kg)      ECOG FS:1 - Symptomatic but completely ambulatory  Sclerae unicteric, EOMs intact Wearing a mask No cervical or supraclavicular adenopathy Lungs no rales or rhonchi Heart regular rate and rhythm Abd soft, nontender, positive bowel sounds MSK no focal spinal tenderness, no upper extremity lymphedema Neuro: nonfocal, well oriented, appropriate affect Breasts: The right breast is currently receiving radiation.  There is significant erythema but so far no desquamation.  The left breast and both axillae are benign.   LAB RESULTS:  CMP     Component Value Date/Time   NA 144 09/14/2020 1122   NA 138 12/15/2019 1543   K 3.7 09/14/2020 1122   CL 108 09/14/2020 1122   CO2 27 09/14/2020 1122   GLUCOSE 111 (H) 09/14/2020 1122   BUN 11 09/14/2020 1122   BUN 10 12/15/2019 1543   CREATININE 0.68 09/14/2020 1122   CREATININE 0.77 02/22/2020 1353   CALCIUM 8.9 09/14/2020 1122   PROT 6.6 09/14/2020 1122   PROT 7.3 12/15/2019 1543   ALBUMIN 3.9  09/14/2020 1122   ALBUMIN 4.5 12/15/2019 1543   AST 20 09/14/2020 1122   AST 13 (L) 02/22/2020 1353   ALT 20 09/14/2020 1122   ALT 8 02/22/2020 1353   ALKPHOS 102 09/14/2020 1122   BILITOT 0.5 09/14/2020 1122   BILITOT 0.3 02/22/2020 1353   GFRNONAA >60 09/14/2020 1122   GFRNONAA >60 02/22/2020 1353   GFRAA >60 03/28/2020 0813   GFRAA >60 02/22/2020 1353    No results found for: TOTALPROTELP, ALBUMINELP, A1GS, A2GS, BETS, BETA2SER, GAMS, MSPIKE, SPEI  Lab Results  Component Value Date   WBC 2.5 (L) 09/14/2020   NEUTROABS 1.3 (L) 09/14/2020   HGB 12.8 09/14/2020   HCT 37.2 09/14/2020   MCV 93.7 09/14/2020   PLT 278 09/14/2020    No results found for: LABCA2  No components found for: [id number]  No results for input(s): INR in the last 168 hours.  No results found for: LABCA2  No results found for: MNO177  No results found for: NHA579  No results found for: UXY333  No  results found for: CA2729  No components found for: HGQUANT  No results found for: CEA1 / No results found for: CEA1   No results found for: AFPTUMOR  No results found for: CHROMOGRNA  No results found for: KPAFRELGTCHN, LAMBDASER, KAPLAMBRATIO (kappa/lambda light chains)  Lab Results  Component Value Date   HGBA 97.0 10/10/2018   (Hemoglobinopathy evaluation)   No results found for: LDH  No results found for: IRON, TIBC, IRONPCTSAT (Iron and TIBC)  No results found for: FERRITIN  Urinalysis    Component Value Date/Time   COLORURINE YELLOW 12/31/2017 2039   APPEARANCEUR CLOUDY (A) 12/31/2017 2039   LABSPEC 1.015 12/31/2017 2039   PHURINE 5.0 12/31/2017 2039   GLUCOSEU NEGATIVE 12/31/2017 2039   HGBUR MODERATE (A) 12/31/2017 2039   BILIRUBINUR NEGATIVE 12/31/2017 2039   [place] NEGATIVE 12/31/2017 2039   PROTEINUR NEGATIVE 12/31/2017 2039   NITRITE NEGATIVE 12/31/2017 2039   LEUKOCYTESUR LARGE (A) 12/31/2017 2039    STUDIES: CT Abdomen Pelvis W Contrast  Result Date: 09/06/2020 CLINICAL DATA:  Right-sided breast cancer completed chemotherapy, currently undergoing radiation therapy. EXAM: CT ABDOMEN AND PELVIS WITH CONTRAST TECHNIQUE: Multidetector CT imaging of the abdomen and pelvis was performed using the standard protocol following bolus administration of intravenous contrast. CONTRAST:  166m OMNIPAQUE IOHEXOL 300 MG/ML  SOLN COMPARISON:  None. FINDINGS: Lower chest: Partially visualized central venous catheter with tip at the superior cavoatrial junction. Postsurgical/treatment changes in the right breast with skin thickening subcutaneous edema and a partially visualized fluid collection in the right lateral chest wall/axilla on image 1/2. There are few tiny 4 mm or smaller bilateral pulmonary nodules. For instance there is a 4 mm right middle lobe pulmonary nodule image 17/4 and a 2 mm left lower lobe pulmonary nodule on image 35/4. Hepatobiliary: Hypodense 4 mm  lesion in the left lobe of the liver image 24/2. No arterially enhancing hepatic lesions. Gallbladder is unremarkable. No biliary ductal dilation. Pancreas: Unremarkable Spleen: Unremarkable Adrenals/Urinary Tract: Bilateral adrenal glands are unremarkable. No hydronephrosis. Symmetric enhancement and excretion the bilateral kidneys. No suspicious filling defect visualized within the opacified portions of the collecting system or ureters on delayed imaging. No nephrolithiasis. No solid enhancing renal lesions. Urinary bladder is grossly unremarkable for degree of distension. Stomach/Bowel: Enteric radiopaque contrast visualized to the level of the transverse colon. Stomach is grossly unremarkable. Small duodenal diverticulum. Normal positioning of the duodenum/ligament of [place]. No suspicious small bowel wall  thickening or dilation. No suspicious colonic wall thickening or mass like lesions. Vascular/Lymphatic: No significant vascular findings are present. No enlarged abdominal or pelvic lymph nodes. Reproductive: IUD in place.  No suspicious adnexal masses. Other: No abdominopelvic ascites. Musculoskeletal: Sclerosis along the bilateral iliac bones at the SI joints without significant sacral involvement. No suspicious lytic or blastic lesions of bone. No acute osseous abnormality IMPRESSION: 1. Postsurgical/treatment changes in the right breast with a partially visualized fluid collection in the right lateral chest wall/axilla, favor seroma. 2. There are few tiny 4 mm or smaller bilateral pulmonary nodules, which are nonspecific. Attention on short interval follow-up with dedicated chest CT in 3 months is suggestive. 3. Hypodense 4 mm lesion in the left lobe of the liver, too small to accurately characterize. Attention on follow-up imaging is recommended. Electronically Signed   By: Dahlia Bailiff MD   On: 09/06/2020 09:06     ELIGIBLE FOR AVAILABLE RESEARCH PROTOCOL: AET  ASSESSMENT: 45 y.o. [place]  woman status post right breast upper outer quadrant biopsy 02/04/2020 for a clinical T2 N0, stage IB invasive ductal carcinoma, grade 2, triple positive, with an MIB-1 of 20%  (1) status post right mastectomy and sentinel lymph node sampling 03/07/2020 for a pT2 pN1, stage IB invasive ductal carcinoma, grade 2, with negative margins.  (a) a total of 4 lymph nodes were removed, one positive  (2) adjuvant chemo immunotherapy will consist of carboplatin, docetaxel, trastuzumab and pertuzumab every 21 days x 6 starting 03/29/2020, completed 07/18/2020  (a) pertuzumab discontinued after cycle 2 secondary to diarrhea  (3) trastuzumab continuing to complete a year  (a) echo 03/18/2020 shows an ejection fraction in the 60-65% range  (b) echo 06/29/2020 showed an EF in the 65-70% range  (c) echo  (4) adjuvant radiation to be completed 10/03/2020  (5) genetics testing 03/02/2020 through the [facility] Common Hereditary Cancers Panel. . The Common Hereditary Cancers Panel found no deleterious mutations in APC, ATM, AXIN2, BARD1, BMPR1A, BRCA1, BRCA2, BRIP1, CDH1, CDK4, CDKN2A (p14ARF), CDKN2A (p16INK4a), CHEK2, CTNNA1, DICER1, EPCAM (Deletion/duplication testing only), GREM1 (promoter region deletion/duplication testing only), KIT, MEN1, MLH1, MSH2, MSH3, MSH6, MUTYH, NBN, NF1, NHTL1, PALB2, PDGFRA, PMS2, POLD1, POLE, PTEN, RAD50, RAD51C, RAD51D, RNF43, SDHB, SDHC, SDHD, SMAD4, SMARCA4. STK11, TP53, TSC1, TSC2, and VHL.  The following genes were evaluated for sequence changes only: SDHA and HOXB13 c.251G>A variant only.   (a)  a Variant of uncertain significance (VUS) in [place] at c.3380T>G (p.Ile1127Ser) was noted  (6) tamoxifen started 02/12/2020 in anticipation of possible surgical delays, held during chemotherapy   PLAN: Jill [name] is tolerating her radiation moderately well.  She is using the creams as suggested.  She is likely to develop significant desquamation in the near future nevertheless  She  tolerates the trastuzumab with no side effects that she is aware of.  She is due for repeat echocardiography and I have entered that order.  I went ahead and entered all her additional Herceptin doses through September since we have very limited treatment area availability and I want to make sure she gets treated without interruptions  Once she is done with radiation we will resume the tamoxifen.  She may well have hemorrhoids or other pelvic issues and I have set her up for a pelvic exam tomorrow.  As far as pain is concerned I recommended she take Advil and Tylenol together 3 times a day as needed.  Total encounter time 25 minutes.Sarajane Jews C. Vernis Eid, MD 09/14/20 12:10 PM Medical  Oncology and Hematology [facility], [place] [address] Tel. [phone]    Fax. [phone]   I, Wilburn Mylar, am acting as scribe for Dr. Virgie Dad. Fina Heizer.  I, Lurline Del MD, have reviewed the above documentation for accuracy and completeness, and I agree with the above.   *Total Encounter Time as defined by the Centers for Medicare and Medicaid Services includes, in addition to the face-to-face time of a patient visit (documented in the note above) non-face-to-face time: obtaining and reviewing outside history, ordering and reviewing medications, tests or procedures, care coordination (communications with other health care professionals or caregivers) and documentation in the medical record.

## 2020-09-14 ENCOUNTER — Other Ambulatory Visit: Payer: Self-pay

## 2020-09-14 ENCOUNTER — Inpatient Hospital Stay (HOSPITAL_BASED_OUTPATIENT_CLINIC_OR_DEPARTMENT_OTHER): Payer: Self-pay | Admitting: Oncology

## 2020-09-14 ENCOUNTER — Ambulatory Visit: Payer: No Typology Code available for payment source

## 2020-09-14 ENCOUNTER — Inpatient Hospital Stay: Payer: Self-pay

## 2020-09-14 ENCOUNTER — Ambulatory Visit (HOSPITAL_COMMUNITY)
Admission: RE | Admit: 2020-09-14 | Discharge: 2020-09-14 | Disposition: A | Discharge status: Home / Self Care | Payer: No Typology Code available for payment source | Source: Ambulatory Visit | Attending: Oncology | Admitting: Oncology

## 2020-09-14 VITALS — BP 111/71 | HR 80 | Temp 97.9°F | Resp 20 | Ht <= 58 in | Wt 171.3 lb

## 2020-09-14 DIAGNOSIS — Z170 Estrogen receptor positive status [ER+]: Secondary | ICD-10-CM

## 2020-09-14 DIAGNOSIS — C50411 Malignant neoplasm of upper-outer quadrant of right female breast: Secondary | ICD-10-CM

## 2020-09-14 DIAGNOSIS — Z95828 Presence of other vascular implants and grafts: Secondary | ICD-10-CM

## 2020-09-14 LAB — CBC WITH DIFFERENTIAL/PLATELET
Abs Immature Granulocytes: 0 10*3/uL (ref 0.00–0.07)
Basophils Absolute: 0 10*3/uL (ref 0.0–0.1)
Basophils Relative: 1 %
Eosinophils Absolute: 0.1 10*3/uL (ref 0.0–0.5)
Eosinophils Relative: 2 %
HCT: 37.2 % (ref 36.0–46.0)
Hemoglobin: 12.8 g/dL (ref 12.0–15.0)
Immature Granulocytes: 0 %
Lymphocytes Relative: 34 %
Lymphs Abs: 0.8 10*3/uL (ref 0.7–4.0)
MCH: 32.2 pg (ref 26.0–34.0)
MCHC: 34.4 g/dL (ref 30.0–36.0)
MCV: 93.7 fL (ref 80.0–100.0)
Monocytes Absolute: 0.3 10*3/uL (ref 0.1–1.0)
Monocytes Relative: 12 %
Neutro Abs: 1.3 10*3/uL — ABNORMAL LOW (ref 1.7–7.7)
Neutrophils Relative %: 51 %
Platelets: 278 10*3/uL (ref 150–400)
RBC: 3.97 MIL/uL (ref 3.87–5.11)
RDW: 11.8 % (ref 11.5–15.5)
WBC: 2.5 10*3/uL — ABNORMAL LOW (ref 4.0–10.5)
nRBC: 0 % (ref 0.0–0.2)

## 2020-09-14 LAB — COMPREHENSIVE METABOLIC PANEL
ALT: 20 U/L (ref 0–44)
AST: 20 U/L (ref 15–41)
Albumin: 3.9 g/dL (ref 3.5–5.0)
Alkaline Phosphatase: 102 U/L (ref 38–126)
Anion gap: 9 (ref 5–15)
BUN: 11 mg/dL (ref 6–20)
CO2: 27 mmol/L (ref 22–32)
Calcium: 8.9 mg/dL (ref 8.9–10.3)
Chloride: 108 mmol/L (ref 98–111)
Creatinine, Ser: 0.68 mg/dL (ref 0.44–1.00)
GFR, Estimated: >60 mL/min (ref >60)
Glucose, Bld: 111 mg/dL — ABNORMAL HIGH (ref 70–99)
Potassium: 3.7 mmol/L (ref 3.5–5.1)
Sodium: 144 mmol/L (ref 135–145)
Total Bilirubin: 0.5 mg/dL (ref 0.3–1.2)
Total Protein: 6.6 g/dL (ref 6.5–8.1)

## 2020-09-14 IMAGING — DX DG CHEST 2V
2 series · 2 of 2 positions shown · non-contrast
Comparison: No priors.

CLINICAL DATA: 44-year-old female with history of malignant
right-sided breast cancer.

EXAM:
CHEST - 2 VIEW

[chest pa]
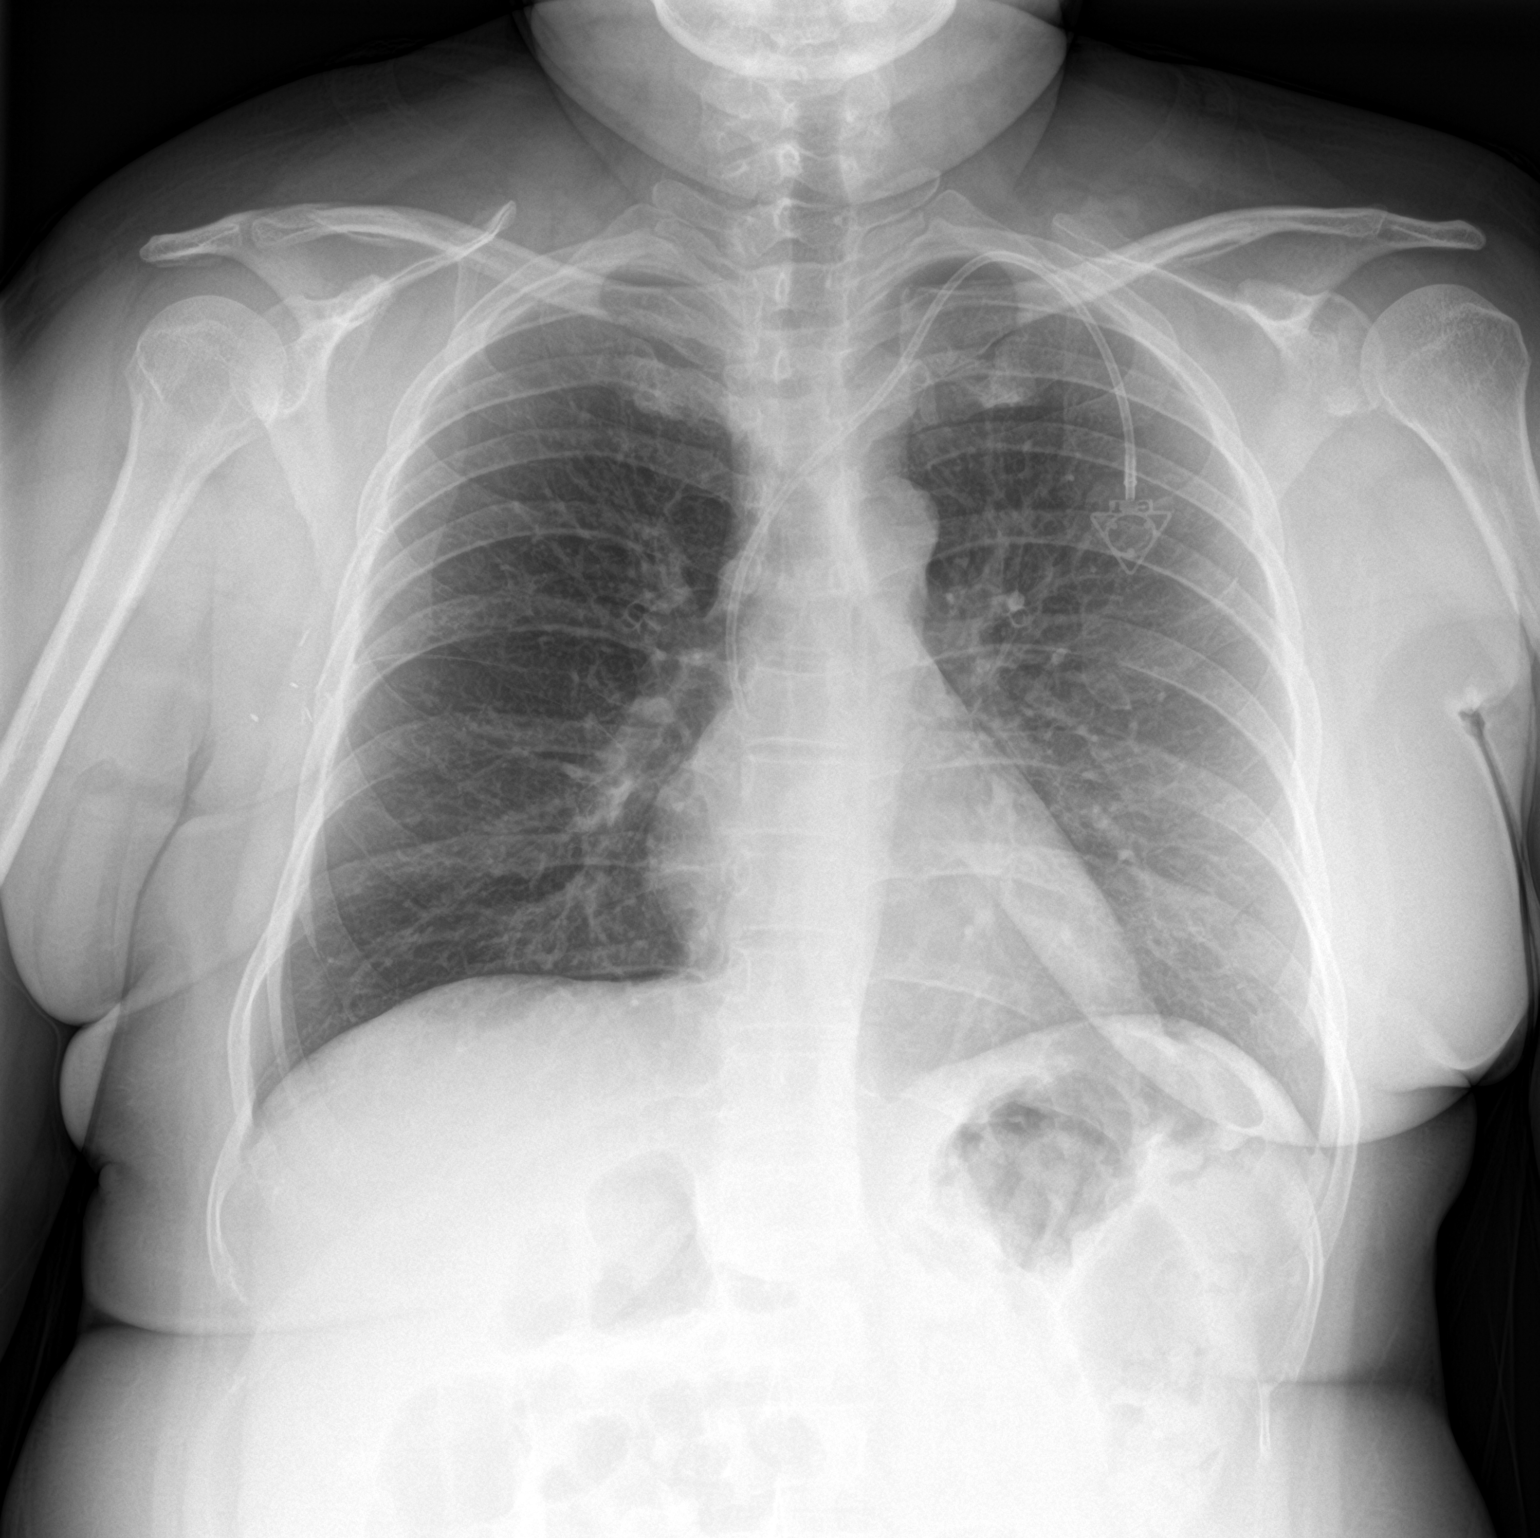

[chest lat]
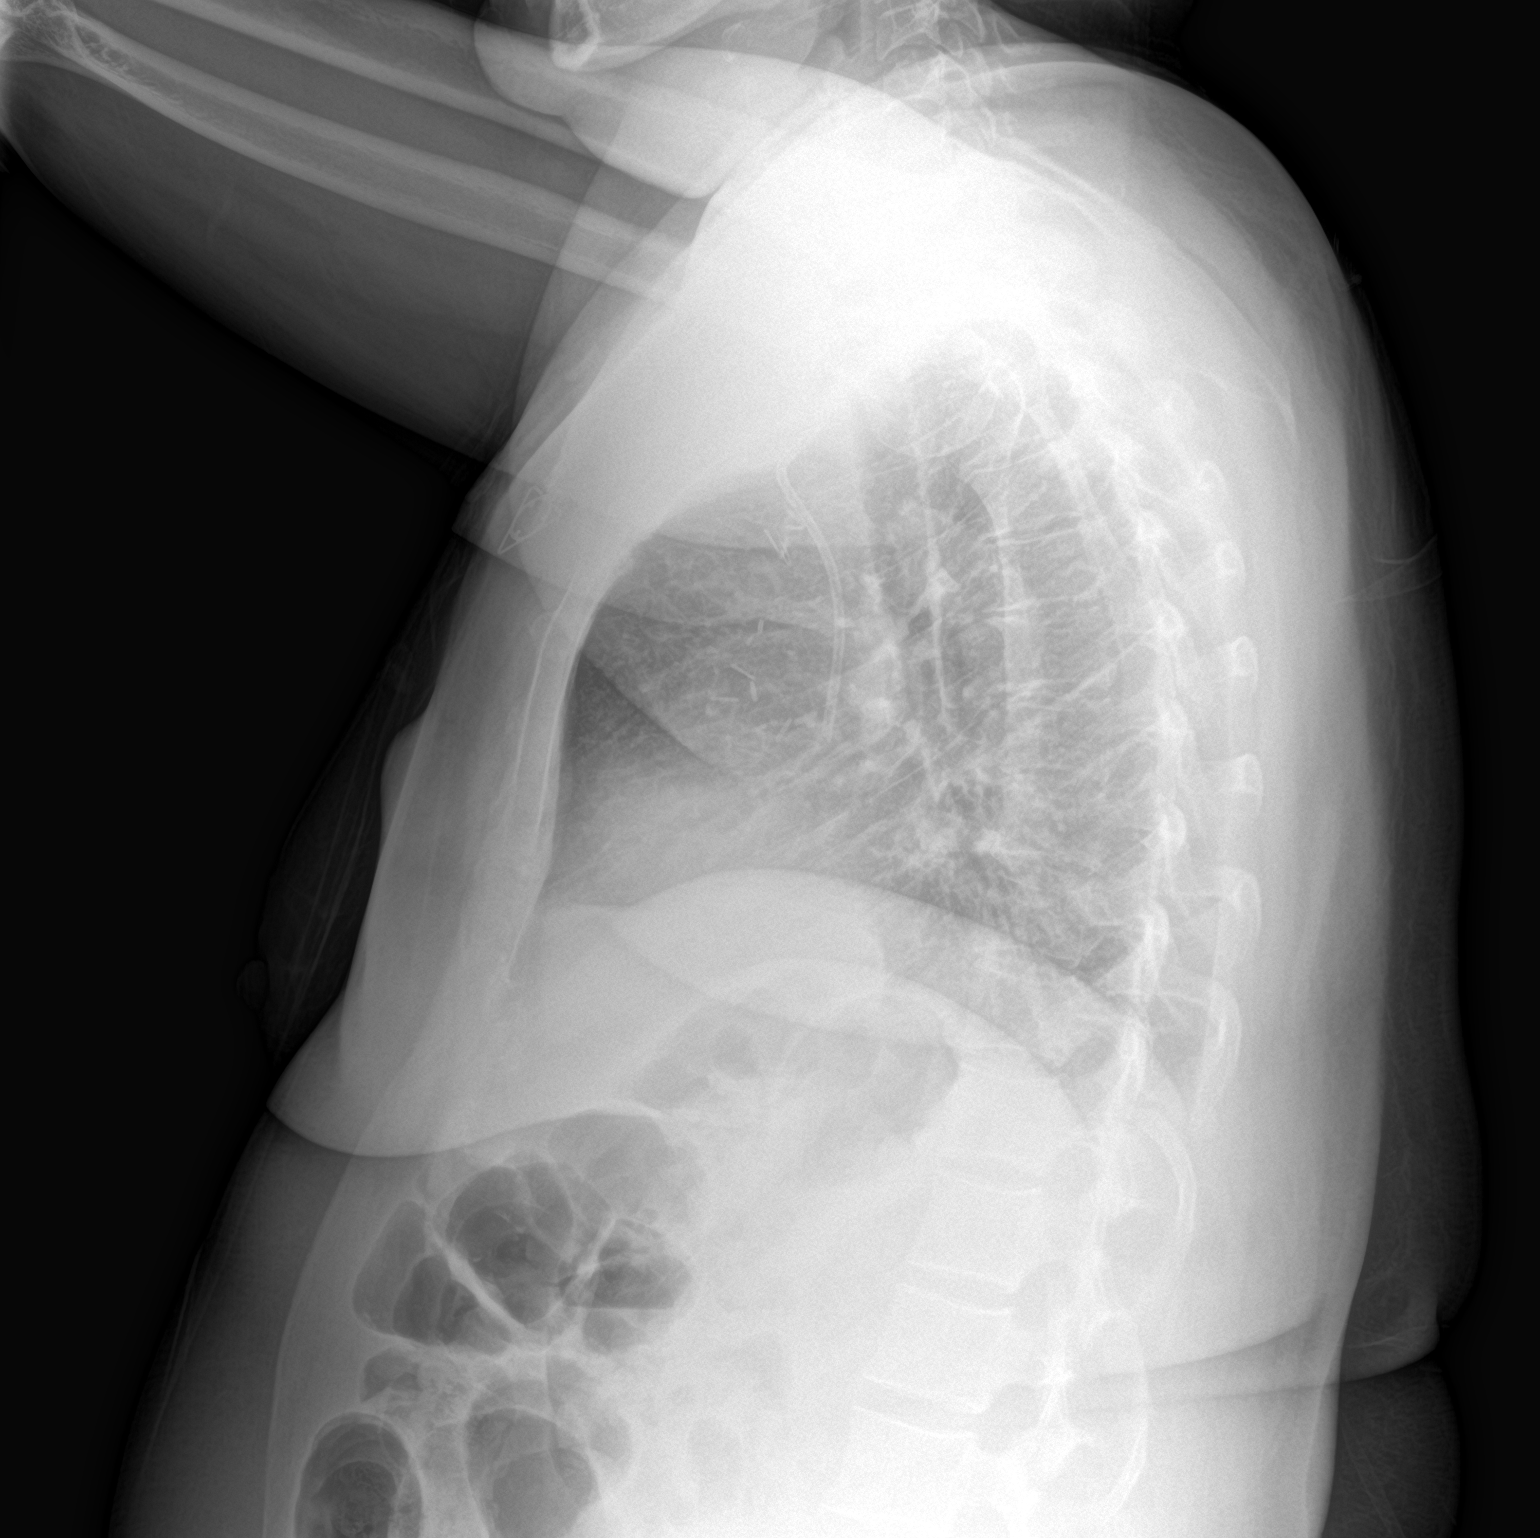

[2 of 2 positions shown; findings below may reference images not displayed]

FINDINGS: Left-sided single-lumen power porta cath with tip terminating at the
superior cavoatrial junction. Lung volumes are normal. No
consolidative airspace disease. No pleural effusions. No
pneumothorax. No pulmonary nodule or mass noted. Pulmonary
vasculature and the cardiomediastinal silhouette are within normal
limits. Postoperative changes of prior right-sided mastectomy and
right axillary lymph node dissection.
IMPRESSION: No radiographic evidence of acute cardiopulmonary disease. No
definitive radiographic findings to suggest metastatic disease to
the lungs.

## 2020-09-14 MED ORDER — SODIUM CHLORIDE 0.9 % IV SOLN
Freq: Once | INTRAVENOUS | Status: AC
  Filled 2020-09-14: qty 250

## 2020-09-14 MED ORDER — DIPHENHYDRAMINE HCL 25 MG PO CAPS
ORAL_CAPSULE | ORAL | Status: AC
  Filled 2020-09-14: qty 1

## 2020-09-14 MED ORDER — SODIUM CHLORIDE 0.9% FLUSH
10.0000 mL | Freq: Once | INTRAVENOUS | Status: AC
  Administered 2020-09-14: 10 mL
  Filled 2020-09-14: qty 10

## 2020-09-14 MED ORDER — ACETAMINOPHEN 325 MG PO TABS
650.0000 mg | ORAL_TABLET | Freq: Once | ORAL | Status: AC
  Administered 2020-09-14: 650 mg via ORAL

## 2020-09-14 MED ORDER — DIPHENHYDRAMINE HCL 25 MG PO CAPS
25.0000 mg | ORAL_CAPSULE | Freq: Once | ORAL | Status: AC
  Administered 2020-09-14: 25 mg via ORAL

## 2020-09-14 MED ORDER — SODIUM CHLORIDE 0.9% FLUSH
10.0000 mL | INTRAVENOUS | Status: DC | PRN
  Administered 2020-09-14: 10 mL
  Filled 2020-09-14: qty 10

## 2020-09-14 MED ORDER — SODIUM CHLORIDE 0.9 % IV SOLN
6.0000 mg/kg | Freq: Once | INTRAVENOUS | Status: AC
  Administered 2020-09-14: 420 mg via INTRAVENOUS
  Filled 2020-09-14: qty 20

## 2020-09-14 MED ORDER — ACETAMINOPHEN 325 MG PO TABS
ORAL_TABLET | ORAL | Status: AC
  Filled 2020-09-14: qty 2

## 2020-09-14 MED ORDER — HEPARIN SOD (PORK) LOCK FLUSH 100 UNIT/ML IV SOLN
500.0000 [IU] | Freq: Once | INTRAVENOUS | Status: AC | PRN
  Administered 2020-09-14: 500 [IU]
  Filled 2020-09-14: qty 5

## 2020-09-14 NOTE — Patient Instructions (Signed)
Cancer Center Discharge Instructions for Patients Receiving Chemotherapy  Today you received the following chemotherapy agents trastuzumab.  To help prevent nausea and vomiting after your treatment, we encourage you to take your nausea medication as directed.    If you develop nausea and vomiting that is not controlled by your nausea medication, call the clinic.   BELOW ARE SYMPTOMS THAT SHOULD BE REPORTED IMMEDIATELY:  *FEVER GREATER THAN 100.5 F  *CHILLS WITH OR WITHOUT FEVER  NAUSEA AND VOMITING THAT IS NOT CONTROLLED WITH YOUR NAUSEA MEDICATION  *UNUSUAL SHORTNESS OF BREATH  *UNUSUAL BRUISING OR BLEEDING  TENDERNESS IN MOUTH AND THROAT WITH OR WITHOUT PRESENCE OF ULCERS  *URINARY PROBLEMS  *BOWEL PROBLEMS  UNUSUAL RASH Items with * indicate a potential emergency and should be followed up as soon as possible.  Feel free to call the clinic should you have any questions or concerns. The clinic phone number is [phone].  Please show the CHEMO ALERT CARD at check-in to the Emergency Department and triage nurse.   

## 2020-09-14 NOTE — Patient Instructions (Signed)
Implanted Port Insertion, Care After This sheet gives you information about how to care for yourself after your procedure. Your health care provider may also give you more specific instructions. If you have problems or questions, contact your health care provider. What can I expect after the procedure? After the procedure, it is common to have:  Discomfort at the port insertion site.  Bruising on the skin over the port. This should improve over 3-4 days. Follow these instructions at home: Port care  After your port is placed, you will get a manufacturer's information card. The card has information about your port. Keep this card with you at all times.  Take care of the port as told by your health care provider. Ask your health care provider if you or a family member can get training for taking care of the port at home. A home health care nurse may also take care of the port.  Make sure to remember what type of port you have. Incision care  Follow instructions from your health care provider about how to take care of your port insertion site. Make sure you: ? Wash your hands with soap and water before and after you change your bandage (dressing). If soap and water are not available, use hand sanitizer. ? Change your dressing as told by your health care provider. ? Leave stitches (sutures), skin glue, or adhesive strips in place. These skin closures may need to stay in place for 2 weeks or longer. If adhesive strip edges start to loosen and curl up, you may trim the loose edges. Do not remove adhesive strips completely unless your health care provider tells you to do that.  Check your port insertion site every day for signs of infection. Check for: ? Redness, swelling, or pain. ? Fluid or blood. ? Warmth. ? Pus or a bad smell.      Activity  Return to your normal activities as told by your health care provider. Ask your health care provider what activities are safe for you.  Do not  lift anything that is heavier than 10 lb (4.5 kg), or the limit that you are told, until your health care provider says that it is safe. General instructions  Take over-the-counter and prescription medicines only as told by your health care provider.  Do not take baths, swim, or use a hot tub until your health care provider approves. Ask your health care provider if you may take showers. You may only be allowed to take sponge baths.  Do not drive for 24 hours if you were given a sedative during your procedure.  Wear a medical alert bracelet in case of an emergency. This will tell any health care providers that you have a port.  Keep all follow-up visits as told by your health care provider. This is important. Contact a health care provider if:  You cannot flush your port with saline as directed, or you cannot draw blood from the port.  You have a fever or chills.  You have redness, swelling, or pain around your port insertion site.  You have fluid or blood coming from your port insertion site.  Your port insertion site feels warm to the touch.  You have pus or a bad smell coming from the port insertion site. Get help right away if:  You have chest pain or shortness of breath.  You have bleeding from your port that you cannot control. Summary  Take care of the port as told by your   health care provider. Keep the manufacturer's information card with you at all times.  Change your dressing as told by your health care provider.  Contact a health care provider if you have a fever or chills or if you have redness, swelling, or pain around your port insertion site.  Keep all follow-up visits as told by your health care provider. This information is not intended to replace advice given to you by your health care provider. Make sure you discuss any questions you have with your health care provider. Document Revised: 01/07/2018 Document Reviewed: 01/07/2018 Elsevier Patient Education   2021 Elsevier Inc.  

## 2020-09-15 ENCOUNTER — Inpatient Hospital Stay (HOSPITAL_BASED_OUTPATIENT_CLINIC_OR_DEPARTMENT_OTHER): Payer: Self-pay | Admitting: Adult Health

## 2020-09-15 ENCOUNTER — Telehealth: Payer: Self-pay | Admitting: Oncology

## 2020-09-15 ENCOUNTER — Ambulatory Visit
Admission: RE | Admit: 2020-09-15 | Discharge: 2020-09-15 | Disposition: A | Discharge status: Home / Self Care | Payer: No Typology Code available for payment source | Source: Ambulatory Visit | Attending: Radiation Oncology | Admitting: Radiation Oncology

## 2020-09-15 ENCOUNTER — Other Ambulatory Visit: Payer: Self-pay

## 2020-09-15 ENCOUNTER — Encounter: Payer: Self-pay | Admitting: Adult Health

## 2020-09-15 ENCOUNTER — Other Ambulatory Visit (HOSPITAL_COMMUNITY)
Admission: RE | Admit: 2020-09-15 | Discharge: 2020-09-15 | Disposition: A | Discharge status: Home / Self Care | Payer: No Typology Code available for payment source | Source: Ambulatory Visit | Attending: Adult Health | Admitting: Adult Health

## 2020-09-15 VITALS — BP 91/59 | HR 92 | Temp 99.7°F | Resp 18 | Ht <= 58 in | Wt 171.4 lb

## 2020-09-15 DIAGNOSIS — Z170 Estrogen receptor positive status [ER+]: Secondary | ICD-10-CM | POA: Insufficient documentation

## 2020-09-15 DIAGNOSIS — C50411 Malignant neoplasm of upper-outer quadrant of right female breast: Secondary | ICD-10-CM | POA: Insufficient documentation

## 2020-09-15 DIAGNOSIS — R1900 Intra-abdominal and pelvic swelling, mass and lump, unspecified site: Secondary | ICD-10-CM

## 2020-09-15 LAB — WET PREP, GENITAL
Clue Cells Wet Prep HPF POC: NONE SEEN
Sperm: NONE SEEN
Trich, Wet Prep: NONE SEEN
Yeast Wet Prep HPF POC: NONE SEEN

## 2020-09-15 NOTE — Telephone Encounter (Signed)
Scheduled appts per 3/23 los. Called pt, using interpreter line. Pt is aware of upcoming appts dates and times.

## 2020-09-15 NOTE — Progress Notes (Signed)
[place]  [phone] [phone]     ID: Jill [name] DOB: 07/24/1975  MR#: [id number]  XIP#:[id number]  Patient Care Team: Gildardo Pounds, NP as PCP - General (Nurse Practitioner) Magrinat, Virgie Dad, MD as Consulting Physician (Oncology) Jovita Kussmaul, MD as Consulting Physician (General Surgery) Dillingham, Loel Lofty, DO as Attending Physician (Plastic Surgery) Mauro Kaufmann, RN as Oncology Nurse Navigator Rockwell Germany, RN as Oncology Nurse Navigator Scot Dock, NP OTHER MD:  CHIEF COMPLAINT: triple positive breast cancer (s/p right mastectomy)  CURRENT TREATMENT: Adjuvant radiation therapy; trastuzumab   INTERVAL HISTORY: [name] returns today for follow up of her triple positive breast cancer.    Since her last visit, she underwent CT abdomen/pelvis on 09/06/2020 showing: partially-visualized fluid collection in right lateral chest wall/axilla, favor seroma; few tiny 4 mm or smaller nonspecific bilateral pulmonary nodules; hypodense 4 mm lesion in left lobe of liver, too small to accurately characterize.  She continues with adjuvant radiation and is scheduled to finish on 09/30/2020.  She continues on trastuzumab.  Most recent echocardiogram was 06/29/2020 and shows a well-preserved ejection fraction.  Her next echo is scheduled for 09/28/2020   REVIEW OF SYSTEMS: Tawonda has some deep pelvic pain she says has been present for 2 years.  At first it was related to her pregnancy, then it was related to having her baby, and the pain has remained in her lower back on the inside of her pelvis and shooting down her legs.  She has not had any management of this, but it is making it difficult for her to sit.  She denies any bowel/bladder incontinence or saddle anesthesia.  She has no dysuria, fever, chills, vaginal dryness or any other concerns.  A detailed ROS was otherwise non contributory.      COVID 19 VACCINATION  STATUS: fully vaccinated [employer]), no booster as of December 2021   HISTORY OF CURRENT ILLNESS: From the original intake note:  "Jill [name]" herself palpated a right breast abnormality sometime in 2019.,  She did not pay much mind.  This was noted again during her pregnancy and she underwent bilateral diagnostic mammography with tomography and right breast ultrasonography at [facility] on 01/21/2020 showing: breast density category C; 4.1 cm irregular mass with associated group of pleomorphic calcifications measuring approximately 4.9 cm, located at site of palpable concern in upper-outer right breast at 9:30; no right axillary lymphadenopathy.  Accordingly on 02/04/2020 she proceeded to biopsy of the right breast area in question. The pathology from this procedure ([id number]) showed: invasive and in situ mammary carcinoma, e-cadherin positive, grade 2. Prognostic indicators significant for: estrogen receptor, 100% positive and progesterone receptor, 100% positive, both with strong staining intensity. Proliferation marker Ki67 at 20%. HER2 equivocal by immunohistochemistry (2+), but positive by fluorescent in situ hybridization with a signals ratio 5.39 and number per cell 11.05.  The patient's subsequent history is as detailed below.   PAST MEDICAL HISTORY: Past Medical History:  Diagnosis Date  . Breast cancer ([place]) 03/07/2020  . Infected cyst of Bartholin's gland duct   . Medical history non-contributory     PAST SURGICAL HISTORY: Past Surgical History:  Procedure Laterality Date  . IR IMAGING GUIDED PORT INSERTION  03/02/2020  . MASTECTOMY W/ SENTINEL NODE BIOPSY Right 03/07/2020   Procedure: RIGHT MASTECTOMY WITH SENTINEL LYMPH NODE BIOPSY;  Surgeon: Jovita Kussmaul, MD;  Location: [facility];  Service: General;  Laterality: Right;  .  NO PAST SURGERIES    . WISDOM TOOTH EXTRACTION      FAMILY HISTORY: Family History  Problem Relation Age of Onset  . Diabetes  Maternal Grandmother   . Hypertension Mother   The patient's father is 80 and her mother 75 as of August 2021.  The patient is 3 brothers and 2 sisters.  There is no history of cancer in the family to her knowledge   GYNECOLOGIC HISTORY:  No LMP recorded. (Menstrual status: IUD). Menarche: 45 years old Age at first live birth: 45 years old [place] P 4 LMP irregular Contraceptive: Mirena IUD in place HRT n/a  Hysterectomy? no BSO? 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  Her significant other Jill [name] works Occupational hygienist.  They are both from [place].  The patient's children are ages 44, 54, 58, and 15 months as of August 2021.  The older 3 children are from a different father and their last name is [name].  (The patient was not married to Mr. [name] so there is no legal issue regarding access to medical records, etc.). The 60-monthold is a child of Jill [name] and Jill [name]  The patient attends a local [place]IVES: Not in place   HEALTH MAINTENANCE: Social History   Tobacco Use  . Smoking status: Never Smoker  . Smokeless tobacco: Never Used  Vaping Use  . Vaping Use: Never used  Substance Use Topics  . Alcohol use: Never  . Drug use: Never     Colonoscopy: n/a (age)  PAP: 10/2018, negative  Bone density: n/a (age)   No Known Allergies  Current Outpatient Medications  Medication Sig Dispense Refill  . ibuprofen (ADVIL) 200 MG tablet Take 400 mg by mouth every 6 (six) hours as needed for headache or moderate pain.     No current facility-administered medications for this visit.    OBJECTIVE: Spanish speaker who appears stated age  V18   09/15/20 1406  BP: (!) 91/59  Pulse: 92  Resp: 18  Temp: 99.7 F (37.6 C)  SpO2: 100%     Body mass index is 35.82 kg/m.   Wt Readings from Last 3 Encounters:  09/15/20 171  lb 6.4 oz (77.7 kg)  09/14/20 171 lb 4.8 oz (77.7 kg)  08/24/20 173 lb (78.5 kg)      ECOG FS:1 - Symptomatic but completely ambulatory GENERAL: Patient is a well appearing female in no acute distress HEENT:  Sclerae anicteric. Mask in place.  Neck is supple.  NODES:  No cervical, supraclavicular, or axillary lymphadenopathy palpated.  BREAST EXAM:  Deferred. LUNGS:  Clear to auscultation bilaterally.  No wheezes or rhonchi. HEART:  Regular rate and rhythm. No murmur appreciated. ABDOMEN:  Soft, nontender.  Positive, normoactive bowel sounds. No organomegaly palpated.\ GYN, normal, no abnormality, cervix visualized, no CMT, no copious discharge, pap smear and wet prep samples collected. MSK:  No focal spinal tenderness to palpation. + sciatic notch tenderness.  Full range of motion bilaterally in the upper extremities. EXTREMITIES:  No peripheral edema.   SKIN:  Clear with no obvious rashes or skin changes. No nail dyscrasia. NEURO:  Nonfocal. Well oriented.  Appropriate affect.    LAB RESULTS:  CMP     Component Value Date/Time   NA 144 09/14/2020 1122   NA 138 12/15/2019 1543   K 3.7 09/14/2020 1122  CL 108 09/14/2020 1122   CO2 27 09/14/2020 1122   GLUCOSE 111 (H) 09/14/2020 1122   BUN 11 09/14/2020 1122   BUN 10 12/15/2019 1543   CREATININE 0.68 09/14/2020 1122   CREATININE 0.77 02/22/2020 1353   CALCIUM 8.9 09/14/2020 1122   PROT 6.6 09/14/2020 1122   PROT 7.3 12/15/2019 1543   ALBUMIN 3.9 09/14/2020 1122   ALBUMIN 4.5 12/15/2019 1543   AST 20 09/14/2020 1122   AST 13 (L) 02/22/2020 1353   ALT 20 09/14/2020 1122   ALT 8 02/22/2020 1353   ALKPHOS 102 09/14/2020 1122   BILITOT 0.5 09/14/2020 1122   BILITOT 0.3 02/22/2020 1353   GFRNONAA >60 09/14/2020 1122   GFRNONAA >60 02/22/2020 1353   GFRAA >60 03/28/2020 0813   GFRAA >60 02/22/2020 1353    No results found for: TOTALPROTELP, ALBUMINELP, A1GS, A2GS, BETS, BETA2SER, GAMS, MSPIKE, SPEI  Lab Results   Component Value Date   WBC 2.5 (L) 09/14/2020   NEUTROABS 1.3 (L) 09/14/2020   HGB 12.8 09/14/2020   HCT 37.2 09/14/2020   MCV 93.7 09/14/2020   PLT 278 09/14/2020    No results found for: LABCA2  No components found for: MWNUUV253  No results for input(s): INR in the last 168 hours.  No results found for: LABCA2  No results found for: GUY403  No results found for: KVQ259  No results found for: DGL875  No results found for: CA2729  No components found for: HGQUANT  No results found for: CEA1 / No results found for: CEA1   No results found for: AFPTUMOR  No results found for: CHROMOGRNA  No results found for: KPAFRELGTCHN, LAMBDASER, KAPLAMBRATIO (kappa/lambda light chains)  Lab Results  Component Value Date   HGBA 97.0 10/10/2018   (Hemoglobinopathy evaluation)   No results found for: LDH  No results found for: IRON, TIBC, IRONPCTSAT (Iron and TIBC)  No results found for: FERRITIN  Urinalysis    Component Value Date/Time   COLORURINE YELLOW 12/31/2017 2039   APPEARANCEUR CLOUDY (A) 12/31/2017 2039   LABSPEC 1.015 12/31/2017 2039   PHURINE 5.0 12/31/2017 2039   GLUCOSEU NEGATIVE 12/31/2017 2039   HGBUR MODERATE (A) 12/31/2017 2039   BILIRUBINUR NEGATIVE 12/31/2017 2039   [place] NEGATIVE 12/31/2017 2039   PROTEINUR NEGATIVE 12/31/2017 2039   NITRITE NEGATIVE 12/31/2017 2039   LEUKOCYTESUR LARGE (A) 12/31/2017 2039    STUDIES: CT Abdomen Pelvis W Contrast  Result Date: 09/06/2020 CLINICAL DATA:  Right-sided breast cancer completed chemotherapy, currently undergoing radiation therapy. EXAM: CT ABDOMEN AND PELVIS WITH CONTRAST TECHNIQUE: Multidetector CT imaging of the abdomen and pelvis was performed using the standard protocol following bolus administration of intravenous contrast. CONTRAST:  131m OMNIPAQUE IOHEXOL 300 MG/ML  SOLN COMPARISON:  None. FINDINGS: Lower chest: Partially visualized central venous catheter with tip at the superior  cavoatrial junction. Postsurgical/treatment changes in the right breast with skin thickening subcutaneous edema and a partially visualized fluid collection in the right lateral chest wall/axilla on image 1/2. There are few tiny 4 mm or smaller bilateral pulmonary nodules. For instance there is a 4 mm right middle lobe pulmonary nodule image 17/4 and a 2 mm left lower lobe pulmonary nodule on image 35/4. Hepatobiliary: Hypodense 4 mm lesion in the left lobe of the liver image 24/2. No arterially enhancing hepatic lesions. Gallbladder is unremarkable. No biliary ductal dilation. Pancreas: Unremarkable Spleen: Unremarkable Adrenals/Urinary Tract: Bilateral adrenal glands are unremarkable. No hydronephrosis. Symmetric enhancement and excretion the bilateral kidneys. No suspicious  filling defect visualized within the opacified portions of the collecting system or ureters on delayed imaging. No nephrolithiasis. No solid enhancing renal lesions. Urinary bladder is grossly unremarkable for degree of distension. Stomach/Bowel: Enteric radiopaque contrast visualized to the level of the transverse colon. Stomach is grossly unremarkable. Small duodenal diverticulum. Normal positioning of the duodenum/ligament of [place]. No suspicious small bowel wall thickening or dilation. No suspicious colonic wall thickening or mass like lesions. Vascular/Lymphatic: No significant vascular findings are present. No enlarged abdominal or pelvic lymph nodes. Reproductive: IUD in place.  No suspicious adnexal masses. Other: No abdominopelvic ascites. Musculoskeletal: Sclerosis along the bilateral iliac bones at the SI joints without significant sacral involvement. No suspicious lytic or blastic lesions of bone. No acute osseous abnormality IMPRESSION: 1. Postsurgical/treatment changes in the right breast with a partially visualized fluid collection in the right lateral chest wall/axilla, favor seroma. 2. There are few tiny 4 mm or smaller  bilateral pulmonary nodules, which are nonspecific. Attention on short interval follow-up with dedicated chest CT in 3 months is suggestive. 3. Hypodense 4 mm lesion in the left lobe of the liver, too small to accurately characterize. Attention on follow-up imaging is recommended. Electronically Signed   By: Dahlia Bailiff MD   On: 09/06/2020 09:06     ELIGIBLE FOR AVAILABLE RESEARCH PROTOCOL: AET  ASSESSMENT: 45 y.o. [place] woman status post right breast upper outer quadrant biopsy 02/04/2020 for a clinical T2 N0, stage IB invasive ductal carcinoma, grade 2, triple positive, with an MIB-1 of 20%  (1) status post right mastectomy and sentinel lymph node sampling 03/07/2020 for a pT2 pN1, stage IB invasive ductal carcinoma, grade 2, with negative margins.  (a) a total of 4 lymph nodes were removed, one positive  (2) adjuvant chemo immunotherapy will consist of carboplatin, docetaxel, trastuzumab and pertuzumab every 21 days x 6 starting 03/29/2020, completed 07/18/2020  (a) pertuzumab discontinued after cycle 2 secondary to diarrhea  (3) trastuzumab continuing to complete a year  (a) echo 03/18/2020 shows an ejection fraction in the 60-65% range  (b) echo 06/29/2020 showed an EF in the 65-70% range  (c) echo  (4) adjuvant radiation to be completed 10/03/2020  (5) genetics testing 03/02/2020 through the [facility] Common Hereditary Cancers Panel. . The Common Hereditary Cancers Panel found no deleterious mutations in APC, ATM, AXIN2, BARD1, BMPR1A, BRCA1, BRCA2, BRIP1, CDH1, CDK4, CDKN2A (p14ARF), CDKN2A (p16INK4a), CHEK2, CTNNA1, DICER1, EPCAM (Deletion/duplication testing only), GREM1 (promoter region deletion/duplication testing only), KIT, MEN1, MLH1, MSH2, MSH3, MSH6, MUTYH, NBN, NF1, NHTL1, PALB2, PDGFRA, PMS2, POLD1, POLE, PTEN, RAD50, RAD51C, RAD51D, RNF43, SDHB, SDHC, SDHD, SMAD4, SMARCA4. STK11, TP53, TSC1, TSC2, and VHL.  The following genes were evaluated for sequence changes only:  SDHA and HOXB13 c.251G>A variant only.   (a)  a Variant of uncertain significance (VUS) in [place] at c.3380T>G (p.Ile1127Ser) was noted  (6) tamoxifen started 02/12/2020 in anticipation of possible surgical delays, held during chemotherapy   PLAN: Shera is here today for f/u of her back pain.  Her pelvic exam was normal, and pap smear and wet prep were sent due to her not having pap in a while and having history of bacterial vaginosis.   I think her pain is likely musculoskeletal sciatic as it first began during her pregnancy, but persisted.  I gave her a handout of exercises, and ordered lumbar and sacral MRI.  She was recommended to take the tylenol and aleve as recommended by Dr. Jana Hakim.    I reviewed this with  her in detail.  She and I discussed the exercises.  Should the MRI be inconclusive, we will refer her to GI.  We will see her back in 3 weeks when she receives her trastuzumab.  She knows to call for any questions that may arise between now and her next appointment.  We are happy to see her sooner if needed.   Total encounter time 30 minutes.Wilber Bihari, NP 09/15/20 3:02 PM Medical Oncology and Hematology [facility] [place], [place] [address] Tel. [phone]    Fax. [phone]  *Total Encounter Time as defined by the Centers for Medicare and Medicaid Services includes, in addition to the face-to-face time of a patient visit (documented in the note above) non-face-to-face time: obtaining and reviewing outside history, ordering and reviewing medications, tests or procedures, care coordination (communications with other health care professionals or caregivers) and documentation in the medical record.

## 2020-09-15 NOTE — Patient Instructions (Signed)
Rehabilitacin de esguince o distensin de la parte inferior de la espalda Low Back Sprain or Strain Rehab Pregunte al mdico qu ejercicios son seguros para usted. Haga los ejercicios exactamente como se lo haya indicado el mdico y gradelos como se lo hayan indicado. Es normal sentir un estiramiento leve, tironeo, opresin o Tree surgeon al [employer] [place]. Detngase de inmediato si siente un dolor repentino o Printmaker. No comience a hacer estos ejercicios hasta que se lo indique el mdico. Ejercicios de elongacin y amplitud de movimiento Estos ejercicios calientan los msculos y las articulaciones, y mejoran el movimiento y la flexibilidad de la espalda. Estos ejercicios tambin ayudan a Best boy, el adormecimiento y el hormigueo. Rotacin lumbar 1. Acustese boca arriba sobre un superficie firme y [place]. 2. Coloque los brazos extendidos a los lados de modo que cada brazo forme un ngulo de 90grados (ngulo recto) con respecto al cuerpo. 3. Mueva lentamente (rote) ambas rodillas hacia un lado del cuerpo hasta que sienta un estiramiento en la parte inferior de la espalda (zona lumbar). Trate de no levantar los hombros del piso. 4. Mantenga esta posicin durante __________ segundos. 5. Tensione los msculos abdominales y lentamente lleve las rodillas a la posicin inicial. 6. Repita este ejercicio del otro lado del cuerpo. Repita __________ veces. Realice este ejercicio __________ veces al da.   Rodilla al pecho 1. Acustese boca arriba en una superficie firme con las piernas extendidas. 2. Flexione una rodilla. Tome la rodilla con las manos y llvela hacia el pecho hasta que sienta un estiramiento suave en la parte inferior de la espalda y las nalgas. ? Mantenga la pierna en esta posicin tomando la parte frontal de la rodilla. ? Mantenga la otra pierna lo ms extendida posible. 3. Mantenga esta posicin durante __________ segundos. 4. Vuelva lentamente a  la posicin inicial. 5. Repita el ejercicio con la otra pierna. Repita __________ veces. Realice este ejercicio __________ veces al da.   Extensin [employer] codos, en decbito prono 1. Recustese boca abajo sobre una superficie firme (posicin prona). 2. Apyese sobre los codos. 3. Con los brazos, aydese a Counselling psychologist sentir un leve estiramiento en el abdomen y la parte inferior de la espalda. ? Administrator, Civil Service de [employer] codos. Si no se siente cmodo, intente colocando almohadas debajo del pecho. ? Debe dejar la cadera inmvil sobre la superficie en la que est apoyado. Mantenga la cadera y los msculos de la espalda relajados. 4. Mantenga esta posicin durante __________ segundos. 5. Afloje lentamente la parte superior del cuerpo y vuelva a la posicin inicial. Repita __________ veces. Realice este ejercicio __________ veces al da.   Ejercicios de fortalecimiento Estos ejercicios fortalecen la espalda y le otorgan resistencia. La resistencia es la capacidad de usar los msculos durante un tiempo prolongado, incluso despus de que se cansen. Inclinacin de la pelvis Este ejercicio fortalece los msculos que se encuentran en la parte profunda del abdomen. 1. Acustese boca arriba sobre una superficie firme. Doble las rodillas y [employer] pies planos sobre el piso. 2. Tensione los msculos abdominales. Eleve la pelvis hacia el techo y aplane la parte inferior de la espalda contra el suelo. ? Para realizar este ejercicio, puede colocar una toalla pequea debajo de la parte inferior de la espalda y presionar la espalda contra la toalla. 3. Mantenga esta posicin durante __________ segundos. 4. Relaje totalmente los msculos antes de repetir el ejercicio. Repita __________ veces. Realice este  ejercicio __________ veces al da. Elevaciones alternadas de pierna y brazo 1. [place] manos y las rodillas sobre una superficie firme. Si est sobre un  suelo duro, puede usar un elemento acolchado, como una alfombrilla para ejercicios, para apoyar las rodillas. 2. Alinee los brazos y las piernas. Las manos deben estar justo debajo de los hombros, y las rodillas debajo de la cadera. 3. Eleve la pierna izquierda hacia atrs. Al mismo tiempo, eleve el brazo derecho y Engineer, petroleum frente a usted. ? No eleve la pierna por encima de la cadera. ? No eleve el brazo por encima del hombro. ? Mantenga los msculos del abdomen y de la espalda contrados. ? Mantenga la cadera [employer] el suelo. ? No arquee la espalda. ? Mantenga el equilibrio con cuidado y no contenga la respiracin. 4. Mantenga esta posicin durante __________ segundos. 5. Vuelva lentamente a la posicin inicial. 6. Repita con su pierna derecha y su brazo izquierdo. Repita __________ veces. Realice este ejercicio __________ veces al da.   Serie de abdominales con levantamiento de pierna extendida 1. Acustese boca arriba sobre una superficie firme. 2. Flexione una rodilla y [place] la otra pierna extendida. 3. Tensione los msculos abdominales y levante la pierna extendida, a unas 4 o 6pulgadas (10 o 15cm) del suelo. 4. Mantenga apretados los msculos abdominales y sostenga esta posicin durante __________ segundos. ? No contenga la respiracin. ? No arquee la espalda. Mantngala plana contra el suelo. 5. Mantenga tensos los msculos abdominales mientras baja lentamente la pierna hasta la posicin inicial. 6. Repita el ejercicio con la otra pierna. Repita __________ veces. Realice este ejercicio __________ veces al da.   Bajar una pierna con rodillas flexionadas 1. Acustese boca arriba sobre una superficie firme. 2. [employer] los msculos abdominales y [employer] del piso, uno a la vez, de modo que las rodillas y la cadera estn flexionadas en ngulos de 90grados (ngulos rectos). ? Las rodillas deben estar por encima de la cadera y las pantorrillas deben quedar paralelas  al piso. 3. Con los msculos abdominales tensos y la rodilla flexionada, baje lentamente una pierna de modo que los dedos del pie toquen el suelo. 4. Levante la pierna para volver a la posicin inicial. ? No contenga la respiracin. ? No deje que la espalda se arquee. Mantenga la espalda plana contra el suelo. 5. Repita el ejercicio con la otra pierna. Repita __________ veces. Realice este ejercicio __________ veces al da. Postura y Chad corporal La buena postura y la Engineer, agricultural corporal saludable pueden ayudar a Theatre stage manager estrs en las articulaciones y los tejidos del cuerpo. La Therapist, nutritional se refiere a los movimientos y a las posiciones del cuerpo mientras realiza las actividades diarias. La postura es una parte de la Therapist, nutritional. Ardelia Mems buena postura significa que:  La columna est en su posicin natural de curvatura en S (neutral).  Los hombros estn [employer].  La cabeza no est inclinada hacia adelante. Siga esas pautas para mejorar la postura y Quarry manager en sus actividades diarias. De pie  Al estar de pie, mantenga la columna en la posicin neutral y los pies separados al ancho de caderas, aproximadamente. Mantenga las rodillas ligeramente flexionadas. [place], los hombros y las caderas deben estar alineados.  Cuando realice una tarea en la que deba estar de pie en el mismo sitio durante mucho tiempo, coloque un pie en un objeto estable de 2 a 4pulgadas (5a 10cm) de alto, como un taburete.  Esto ayuda a que la columna mantenga una posicin neutral.   Sentado  Cuando est sentado, mantenga la columna en posicin neutral y deje los pies apoyados en el suelo. Use un apoyapis, si es necesario, y [employer] muslos paralelos al suelo. Evite redondear los hombros e inclinar la cabeza hacia adelante.  Cuando trabaje en un escritorio o con una computadora, el escritorio debe estar a una altura en la que las manos estn un poco ms abajo que los  codos. Deslice la silla debajo del escritorio, de modo de estar lo suficientemente cerca como para mantener una buena [place].  Cuando trabaje con una computadora, coloque el monitor a una altura que le permita mirar derecho hacia adelante, sin tener que inclinar la cabeza hacia adelante o [place] atrs.   Reposo  Al descansar o estar acostado, evite las posiciones que le causen ms dolor.  Si siente dolor al [employer] que exigen sentarse, inclinarse, agacharse o ponerse en cuclillas, acustese en una posicin en la que el cuerpo no deba doblarse mucho. Por ejemplo, evite acurrucarse de costado con los brazos y las rodillas cerca del pecho (posicin fetal).  Si siente dolor con las actividades que exigen estar de pie durante mucho tiempo o Training and development officer los brazos, acustese con la columna en una posicin neutral y flexione ligeramente las rodillas. Pruebe con las siguientes posiciones: ? Acupuncturist de costado con una almohada entre las rodillas. ? Acostarse boca arriba con una almohada debajo de las rodillas. Levantar objetos  Cuando tenga que levantar un objeto, mantenga los pies separados el ancho de los hombros, como [place], y apriete los msculos abdominales.  [place] y la cadera, y Quarry manager la columna en posicin neutral. Es importante levantarse utilizando la fuerza de las piernas, no de la espalda. No trabe las rodillas hacia afuera.  Siempre pida ayuda a otra persona para levantar objetos pesados o incmodos.   Esta informacin no tiene Marine scientist el consejo del mdico. Asegrese de hacerle al mdico cualquier pregunta que tenga. Document Revised: 08/06/2018 Document Reviewed: 08/06/2018 Elsevier Patient Education  2021 [employer].

## 2020-09-16 ENCOUNTER — Other Ambulatory Visit: Payer: Self-pay

## 2020-09-16 ENCOUNTER — Ambulatory Visit: Payer: No Typology Code available for payment source | Admitting: Radiation Oncology

## 2020-09-16 ENCOUNTER — Ambulatory Visit
Admission: RE | Admit: 2020-09-16 | Discharge: 2020-09-16 | Disposition: A | Discharge status: Home / Self Care | Payer: No Typology Code available for payment source | Source: Ambulatory Visit | Attending: Radiation Oncology | Admitting: Radiation Oncology

## 2020-09-19 ENCOUNTER — Ambulatory Visit
Admission: RE | Admit: 2020-09-19 | Discharge: 2020-09-19 | Disposition: A | Discharge status: Home / Self Care | Payer: No Typology Code available for payment source | Source: Ambulatory Visit | Attending: Radiation Oncology | Admitting: Radiation Oncology

## 2020-09-19 ENCOUNTER — Other Ambulatory Visit: Payer: Self-pay

## 2020-09-19 LAB — CYTOLOGY - PAP
Comment: NEGATIVE
Diagnosis: NEGATIVE
High risk HPV: NEGATIVE

## 2020-09-20 ENCOUNTER — Ambulatory Visit
Admission: RE | Admit: 2020-09-20 | Discharge: 2020-09-20 | Disposition: A | Discharge status: Home / Self Care | Payer: No Typology Code available for payment source | Source: Ambulatory Visit | Attending: Radiation Oncology | Admitting: Radiation Oncology

## 2020-09-21 ENCOUNTER — Other Ambulatory Visit: Payer: Self-pay

## 2020-09-21 ENCOUNTER — Ambulatory Visit
Admission: RE | Admit: 2020-09-21 | Discharge: 2020-09-21 | Disposition: A | Discharge status: Home / Self Care | Payer: No Typology Code available for payment source | Source: Ambulatory Visit | Attending: Radiation Oncology | Admitting: Radiation Oncology

## 2020-09-22 ENCOUNTER — Other Ambulatory Visit: Payer: Self-pay

## 2020-09-22 ENCOUNTER — Ambulatory Visit
Admission: RE | Admit: 2020-09-22 | Discharge: 2020-09-22 | Disposition: A | Discharge status: Home / Self Care | Payer: No Typology Code available for payment source | Source: Ambulatory Visit | Attending: Radiation Oncology | Admitting: Radiation Oncology

## 2020-09-22 DIAGNOSIS — C50411 Malignant neoplasm of upper-outer quadrant of right female breast: Secondary | ICD-10-CM

## 2020-09-22 MED ORDER — RADIAPLEXRX EX GEL: Freq: Once | CUTANEOUS | Status: AC

## 2020-09-23 ENCOUNTER — Other Ambulatory Visit: Payer: Self-pay

## 2020-09-23 ENCOUNTER — Ambulatory Visit
Admission: RE | Admit: 2020-09-23 | Discharge: 2020-09-23 | Disposition: A | Discharge status: Home / Self Care | Payer: No Typology Code available for payment source | Source: Ambulatory Visit | Attending: Radiation Oncology | Admitting: Radiation Oncology

## 2020-09-23 ENCOUNTER — Ambulatory Visit: Payer: No Typology Code available for payment source | Admitting: Radiation Oncology

## 2020-09-23 DIAGNOSIS — C50411 Malignant neoplasm of upper-outer quadrant of right female breast: Secondary | ICD-10-CM | POA: Insufficient documentation

## 2020-09-23 DIAGNOSIS — Z170 Estrogen receptor positive status [ER+]: Secondary | ICD-10-CM | POA: Insufficient documentation

## 2020-09-24 ENCOUNTER — Ambulatory Visit (HOSPITAL_COMMUNITY)
Admission: RE | Admit: 2020-09-24 | Discharge: 2020-09-24 | Disposition: A | Discharge status: Home / Self Care | Payer: Self-pay | Source: Ambulatory Visit | Attending: Adult Health | Admitting: Adult Health

## 2020-09-24 DIAGNOSIS — Z170 Estrogen receptor positive status [ER+]: Secondary | ICD-10-CM

## 2020-09-24 DIAGNOSIS — C50411 Malignant neoplasm of upper-outer quadrant of right female breast: Secondary | ICD-10-CM | POA: Insufficient documentation

## 2020-09-24 IMAGING — MR MR LUMBAR SPINE WO/W CM
5 of 7 series · 32 of 48 positions shown · IV contrast (7ML GADAVIST)
Comparison: CT abdomen and pelvis from [DATE].

CLINICAL DATA: Breast cancer staging.  Lower back pain.

EXAM:
MRI LUMBAR SPINE WITHOUT AND WITH CONTRAST
TECHNIQUE: Multiplanar and multiecho pulse sequences of the lumbar spine were
obtained without and with intravenous contrast.
CONTRAST:  7mL GADAVIST GADOBUTROL 1 MMOL/ML IV SOLN

[Series 5: T1 · sagittal · 4.0mm · 0.81mm/px · 5 of 15 slices shown (1 of 2)]
[im 1/15]
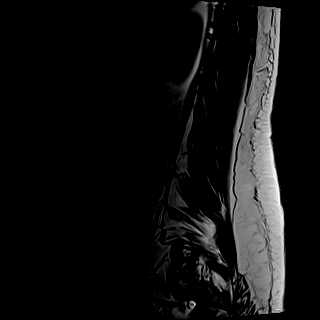
[im 4/15]
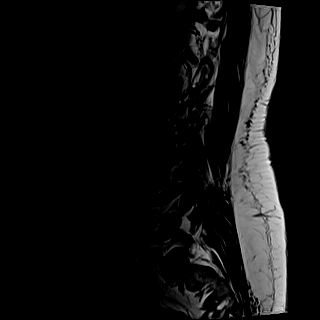
[im 8/15]
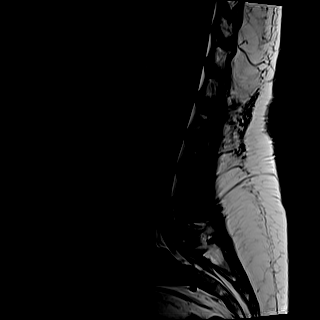
[im 11/15]
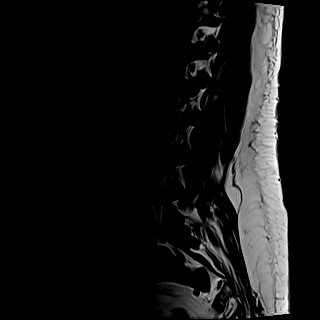
[im 15/15]
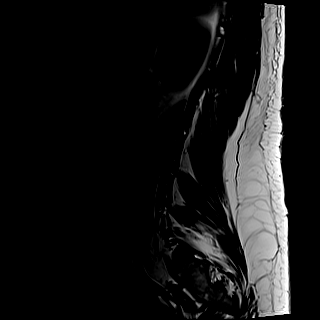

[Series 7: T2 · axial · 4.0mm · 0.62mm/px · z∈[-20,+180]mm · 11 of 36 slices shown]
[im 1/36]
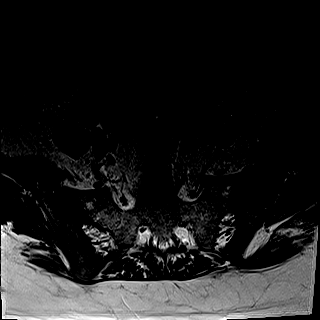
[im 4/36]
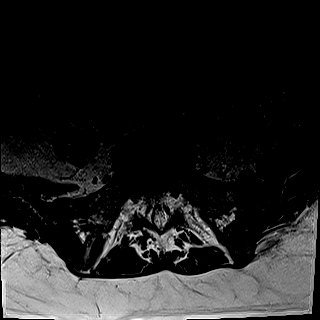
[im 8/36]
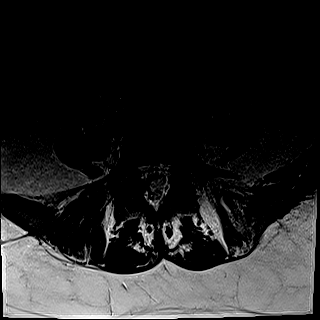
[im 11/36]
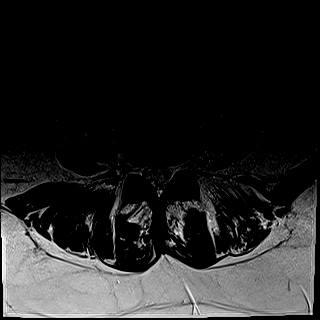
[im 15/36]
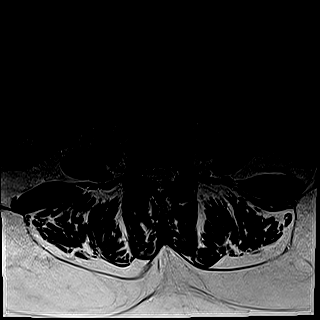
[im 18/36]
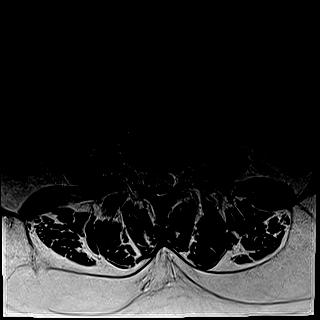
[im 22/36]
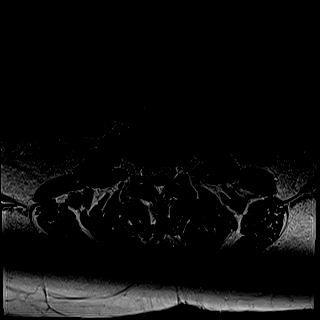
[im 25/36]
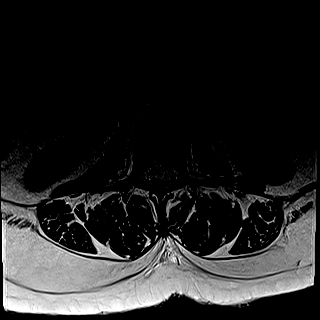
[im 29/36]
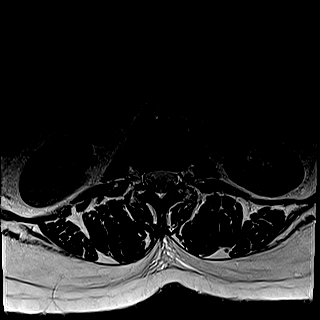
[im 32/36]
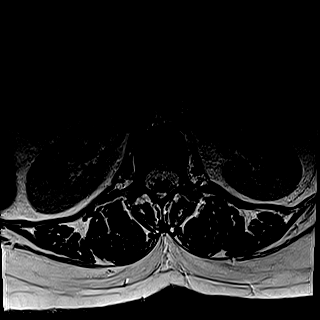
[im 36/36]
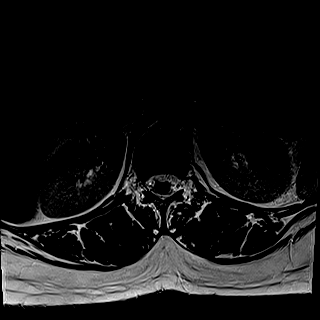

[Series 9: T1 · axial · 4.0mm · 0.39mm/px · z∈[-20,+180]mm · 8 of 36 slices shown (2 of 2)]
[im 1/36]
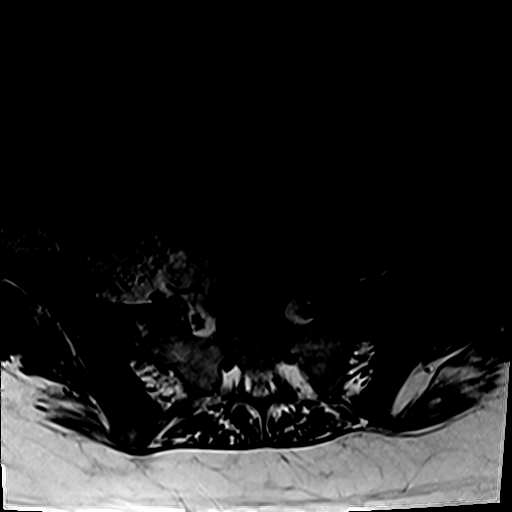
[im 4/36]
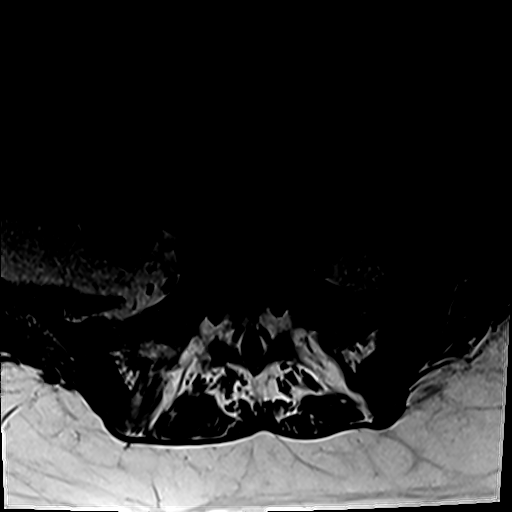
[im 12/36]
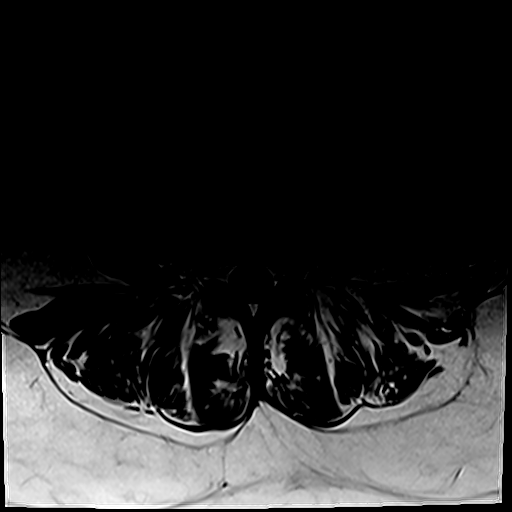
[im 16/36]
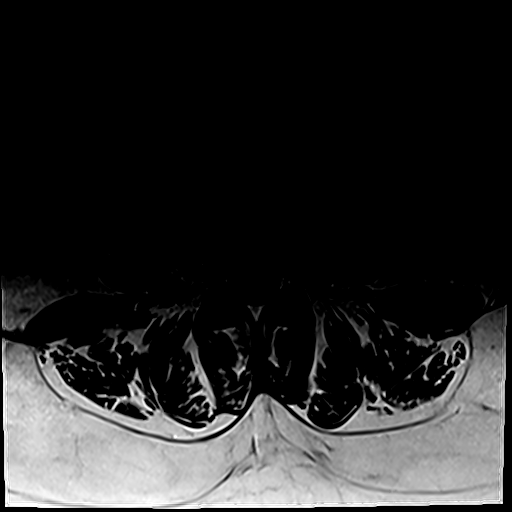
[im 20/36]
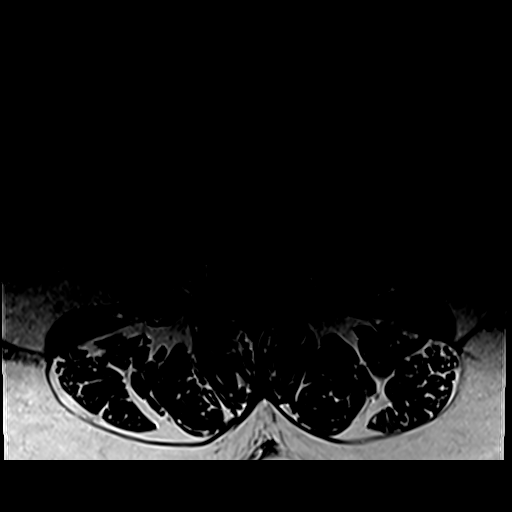
[im 24/36]
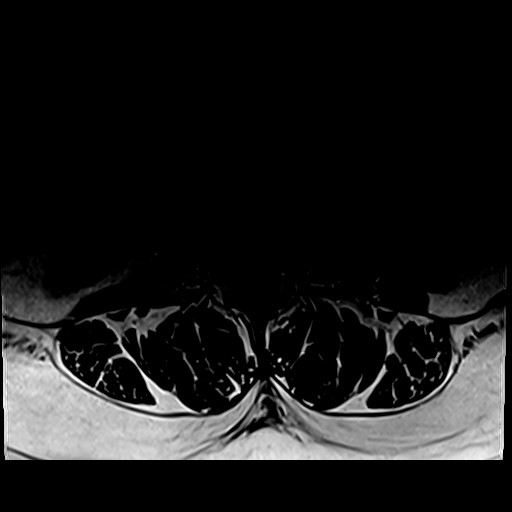
[im 32/36]
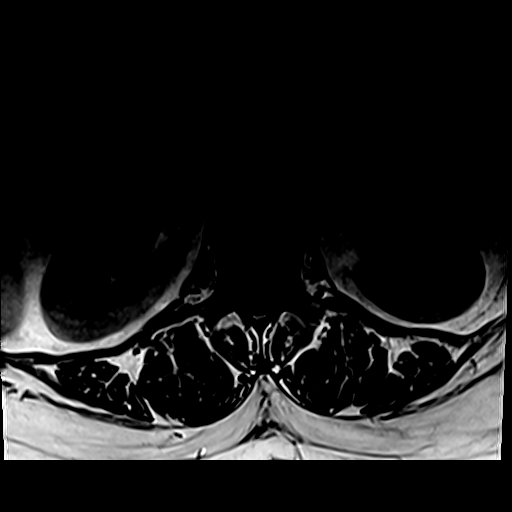
[im 36/36]
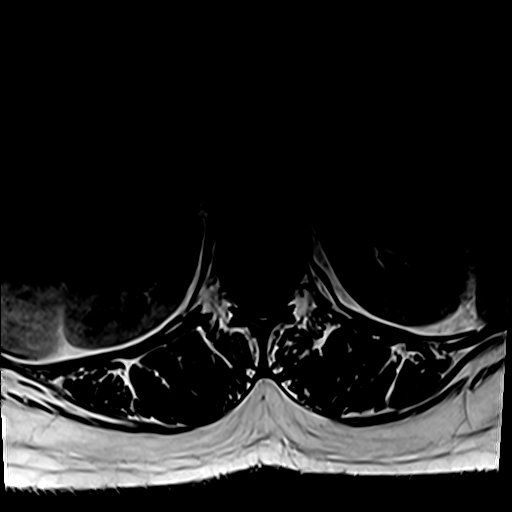

[Series 10: T2 post-contrast · sagittal · 4.0mm · 0.81mm/px · 4 of 15 slices shown]
[im 1/15]
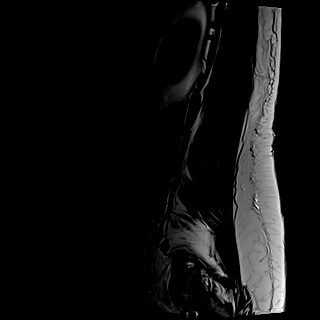
[im 5/15]
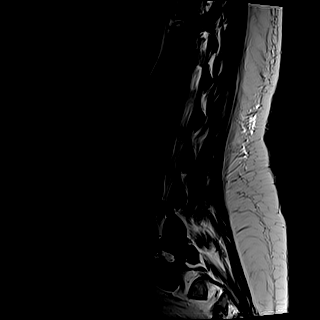
[im 10/15]
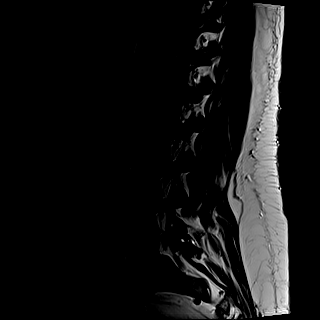
[im 15/15]
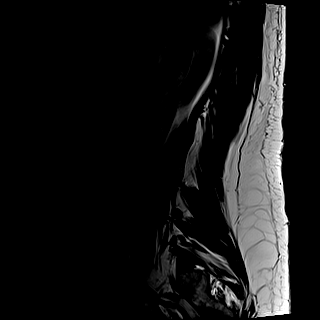

[Series 11: T1 fat-sat post-contrast · sagittal · 4.0mm · 0.81mm/px · 4 of 15 slices shown]
[im 1/15]
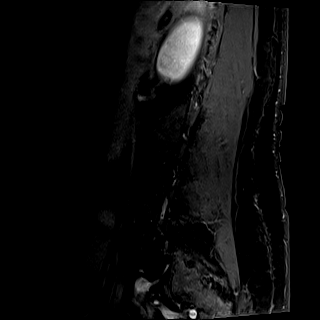
[im 5/15]
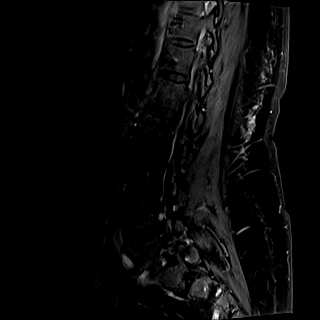
[im 10/15]
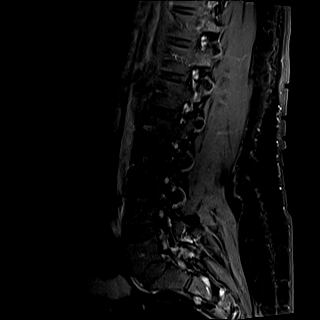
[im 15/15]
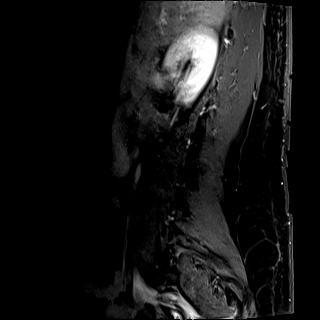

[32 of 48 positions shown; findings below may reference images not displayed]

FINDINGS: Segmentation: Standard segmentation is assumed. The inferior-most
fully formed intervertebral disc is labeled L5-S1. In

Alignment: Grade 1 anterolisthesis of L5 on S1.

Vertebrae: Vertebral body heights are maintained. No specific
evidence of acute fracture, discitis/osteomyelitis, or suspicious
bone lesion. No abnormal enhancing lesion identified. Bilateral L5
pars defects, confirmed on prior CT abdomen and pelvis.

Conus medullaris and cauda equina: Conus extends to the superior L2
level. Conus appears normal. No evidence of abnormal enhancement of
the conus or cauda quinine

Paraspinal and other soft tissues: Mild perifacet
inflammatory/degenerative arthropathy at L5-S1. Otherwise,
unremarkable.

Disc levels:

T11-T12: Only imaged sagittally without significant canal or
foraminal stenosis.

T12-L1: No significant disc protrusion, foraminal stenosis, or canal
stenosis.

L1-L2: No significant disc protrusion, foraminal stenosis, or canal
stenosis.

L2-L3: No significant disc protrusion, foraminal stenosis, or canal
stenosis.

L3-L4: No significant disc protrusion, foraminal stenosis, or canal
stenosis.

L4-L5: No significant disc protrusion, foraminal stenosis, or canal
stenosis.

L5-S1: Bilateral pars defects. Grade 1 anterolisthesis of L5 on S1.
Disc desiccation. Bilateral facet hypertrophy/arthropathy. Mildly
prominent dorsal epidural. Uncovering of the disc with superimposed
broad disc bulge and small left foraminal annular fissure. Resulting
mild bilateral foraminal stenosis. The canal is mildly narrowed in
transverse dimension.
IMPRESSION: 1. No evidence of osseous metastatic disease in the lumbar spine.
2. Bilateral L5 pars defects with grade 1 anterolisthesis of L5 on
S1. Resulting degenerative disc disease and facet arthropathy at
this level with mild canal and bilateral foraminal stenosis.

## 2020-09-24 IMAGING — MR MR SACRUM / SI JOINTS WO/W CM
8 series · 47 of 48 positions shown · IV contrast (gadavist)
Comparison: CT pelvis [DATE]

CLINICAL DATA: Low back pain, breast cancer.

EXAM:
MRI SACRUM WITH AND WITHOUT CONTRAST
TECHNIQUE: Multiplanar multi-sequence MR imaging of the sacrum was performed
before and after IV contrast administration.
CONTRAST:  CONTRAST
7 cc Gadavist

[Series 4: T1 · coronal · 3.0mm · 0.86mm/px · 6 of 41 slices shown (1 of 2)]
[im 1/41]
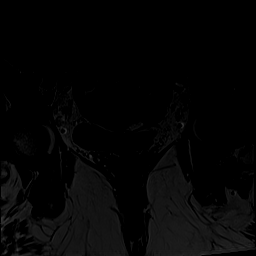
[im 9/41]
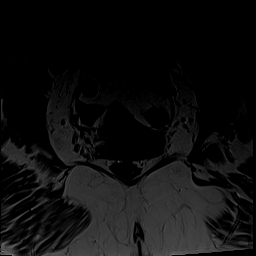
[im 17/41]
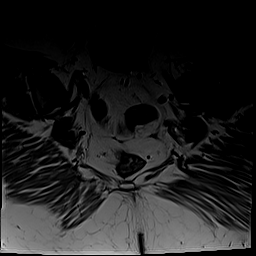
[im 25/41]
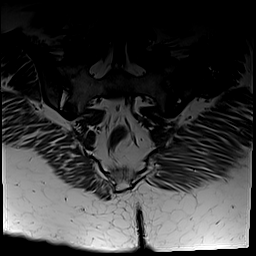
[im 33/41]
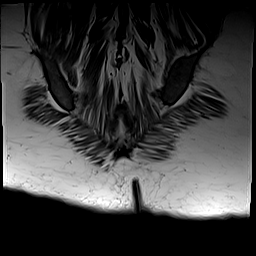
[im 41/41]
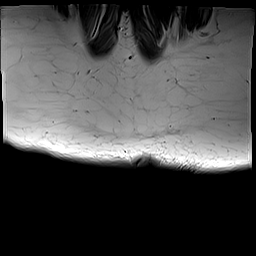

[Series 5: T2 fat-sat · axial · 4.0mm · 1.17mm/px · z∈[-178,-28]mm · 5 of 34 slices shown (1 of 2)]
[im 1/34]
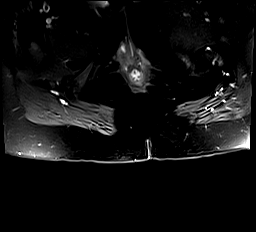
[im 9/34]
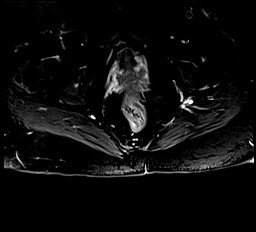
[im 17/34]
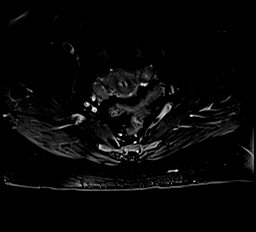
[im 25/34]
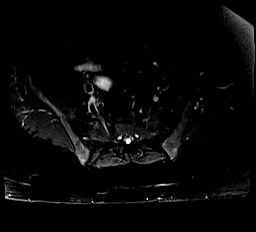
[im 34/34]
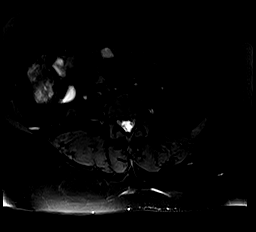

[Series 6: T1 · axial · 4.0mm · 1.17mm/px · z∈[-178,-28]mm · 6 of 34 slices shown (2 of 2)]
[im 1/34]
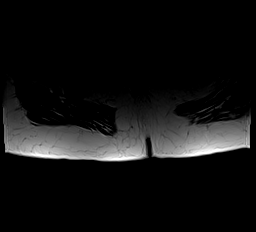
[im 7/34]
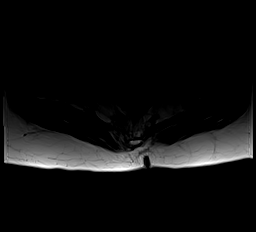
[im 14/34]
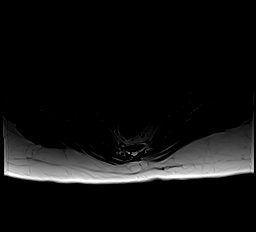
[im 20/34]
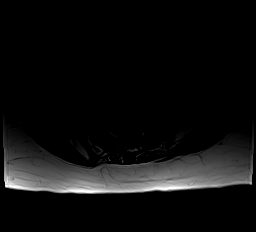
[im 27/34]
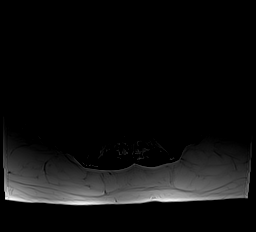
[im 34/34]
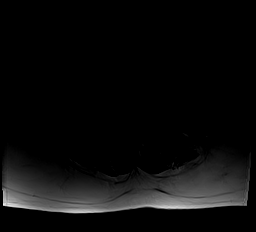

[Series 7: STIR · coronal · 3.0mm · 0.43mm/px · 7 of 41 slices shown]
[im 1/41]
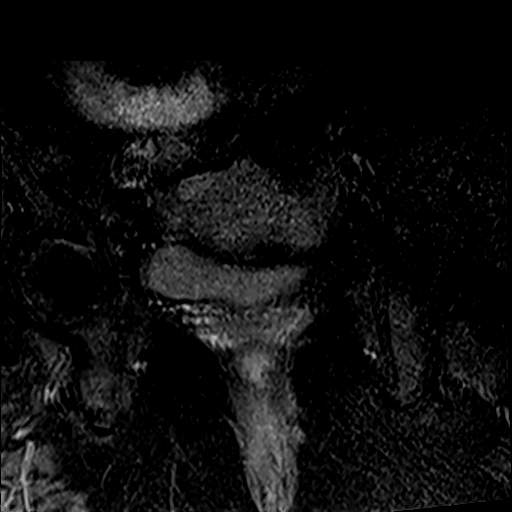
[im 7/41]
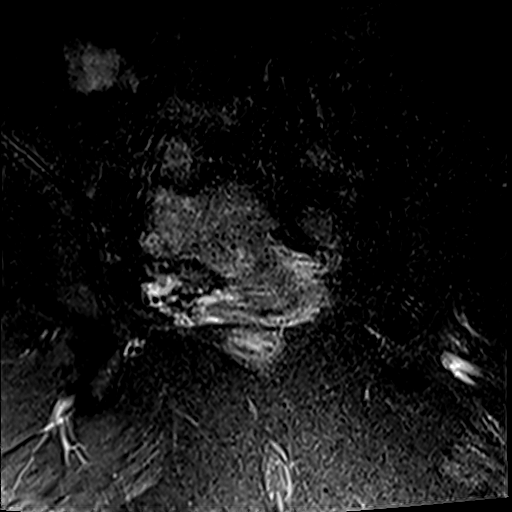
[im 14/41]
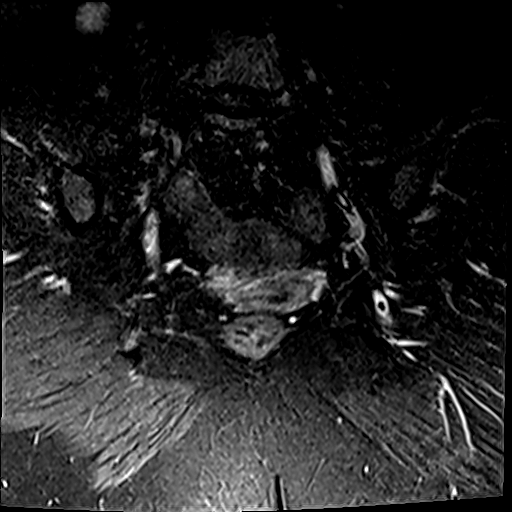
[im 21/41]
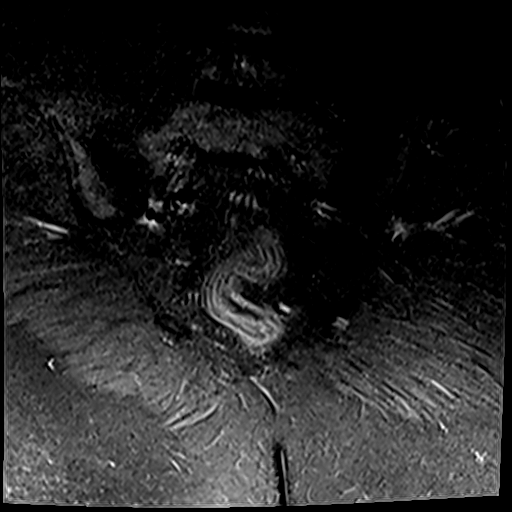
[im 27/41]
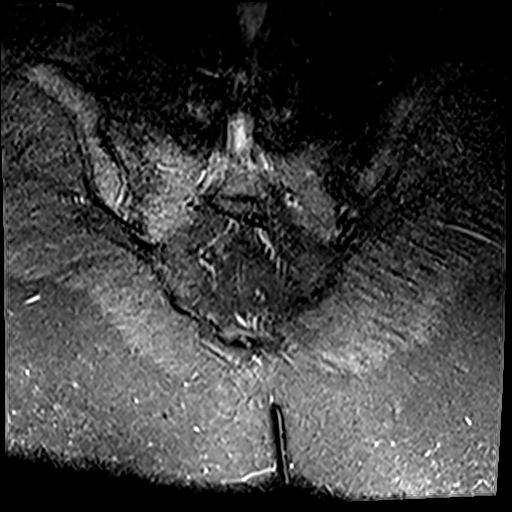
[im 34/41]
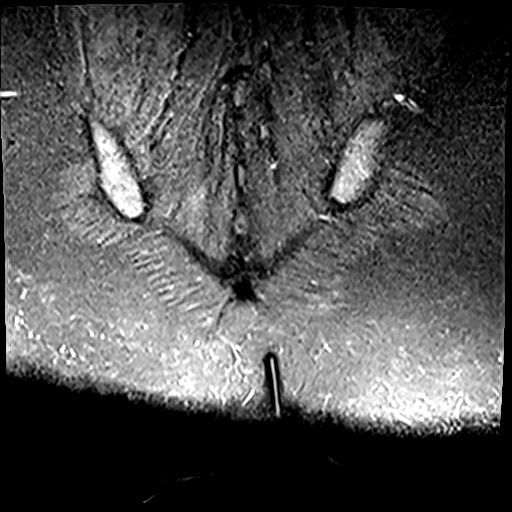
[im 41/41]
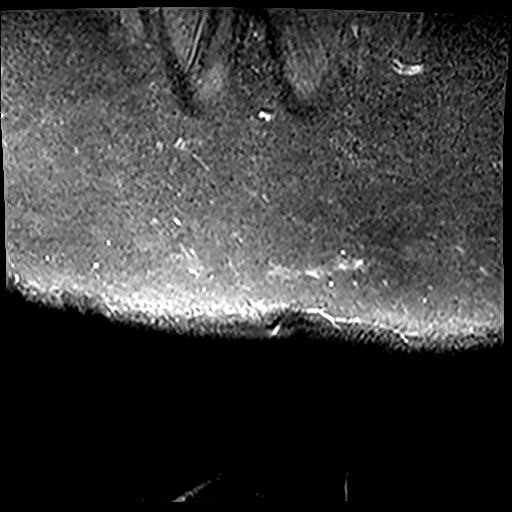

[Series 8: T2 fat-sat · sagittal · 4.0mm · 0.75mm/px · 5 of 28 slices shown (2 of 2)]
[im 1/28]
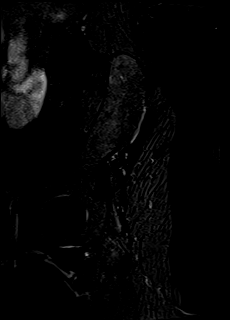
[im 7/28]
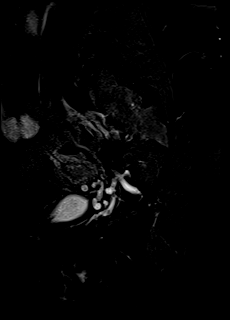
[im 14/28]
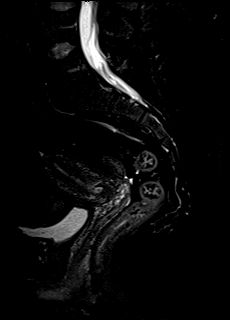
[im 21/28]
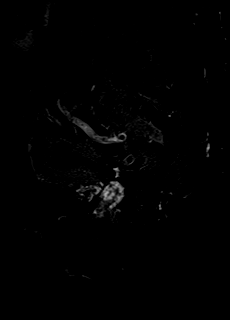
[im 28/28]
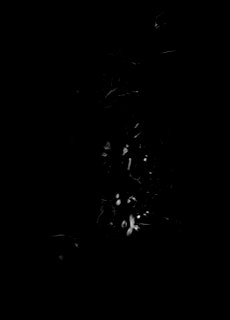

[Series 9: T1 fat-sat · axial · non-contrast · 4.0mm · 1.17mm/px · z∈[-181,-30]mm · 6 of 34 slices shown]
[im 1/34]
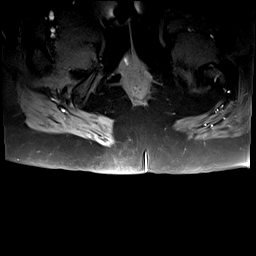
[im 7/34]
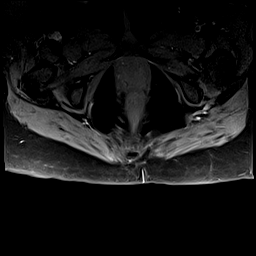
[im 14/34]
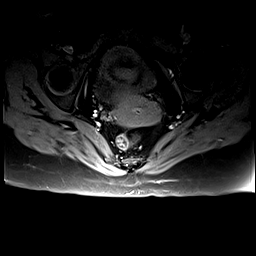
[im 20/34]
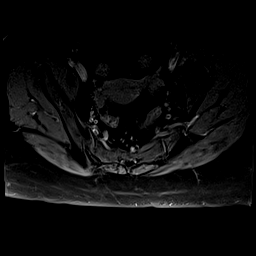
[im 27/34]
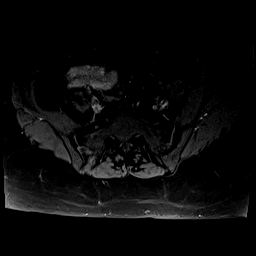
[im 34/34]
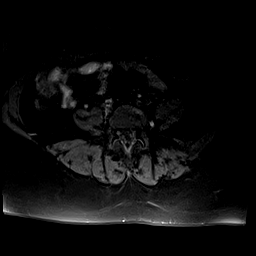

[Series 10: T1 fat-sat post-contrast · axial · 4.0mm · 1.17mm/px · z∈[-184,-34]mm · 6 of 34 slices shown (1 of 2)]
[im 1/34]
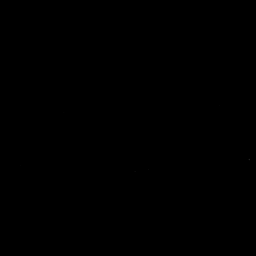
[im 7/34]
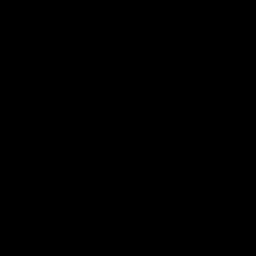
[im 14/34]
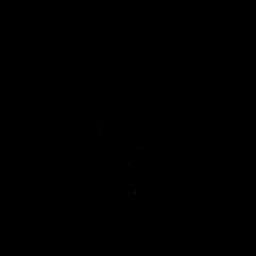
[im 20/34]
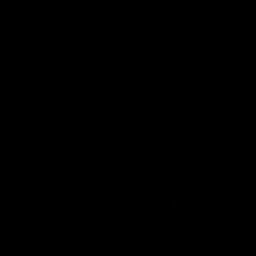
[im 27/34]
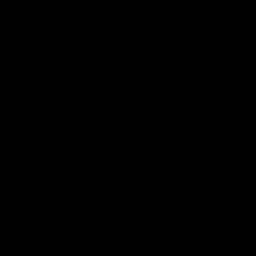
[im 34/34]
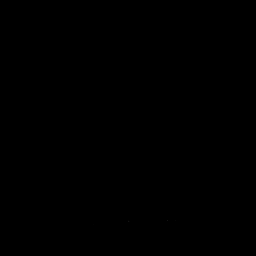

[Series 11: T1 fat-sat post-contrast · coronal · 3.0mm · 0.86mm/px · 6 of 41 slices shown (2 of 2)]
[im 1/41]
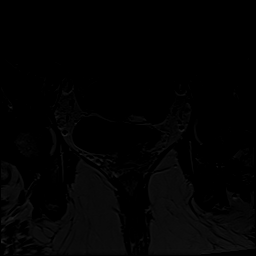
[im 7/41]
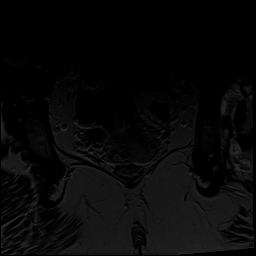
[im 14/41]
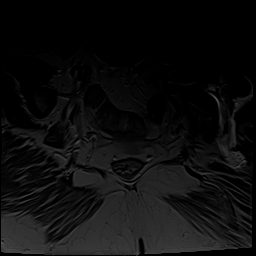
[im 21/41]
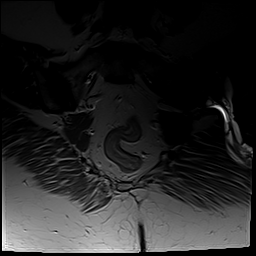
[im 27/41]
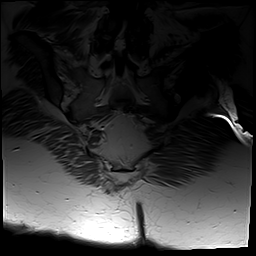
[im 34/41]
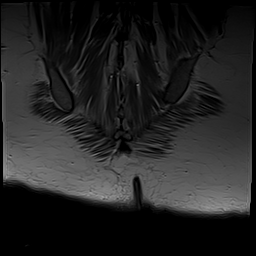

[47 of 48 positions shown; findings below may reference images not displayed]

FINDINGS: Urinary Tract:  Unremarkable

Bowel:  Unremarkable

Vascular/Lymphatic: Small bilateral inguinal lymph nodes are not
pathologically enlarged.

Reproductive: T-shaped IUD satisfactorily positioned along the
endometrium. Right Bartholin cyst noted.

Other:  No supplemental non-categorized findings.

Musculoskeletal: Symmetric reduced T1 and T2 signal along both
sacroiliac joints reflecting the sclerosis seen on CT, without
significant abnormal marrow edema or well-defined erosions. No
effusion of the SI joints noted. Typical enhancement of the synovium
observed.

Note is made of pars defects at L5.

There is no impinging lesion along the sacral plexus or visualized
proximal sciatic nerves. No abnormal enhancement or edema signal in
the sciatic nerves.

No findings of regional pelvic metastatic disease.
IMPRESSION: 1. Chronic bilateral sacroiliitis, with sclerosis along the SI
joints but no current edema, erosion, or effusion.
2. No findings of metastatic disease to the visualized pelvis.
3. Incidental IUD along the endometrium.  Incidental Bartholin cyst.
4. Pars defects at L5, better shown on recent lumbar spine MRI.

## 2020-09-24 MED ORDER — GADOBUTROL 1 MMOL/ML IV SOLN
7.0000 mL | Freq: Once | INTRAVENOUS | Status: AC | PRN
  Administered 2020-09-24: 7 mL via INTRAVENOUS

## 2020-09-26 ENCOUNTER — Ambulatory Visit
Admission: RE | Admit: 2020-09-26 | Discharge: 2020-09-26 | Disposition: A | Discharge status: Home / Self Care | Payer: No Typology Code available for payment source | Source: Ambulatory Visit | Attending: Radiation Oncology | Admitting: Radiation Oncology

## 2020-09-26 ENCOUNTER — Other Ambulatory Visit: Payer: Self-pay

## 2020-09-26 ENCOUNTER — Ambulatory Visit: Payer: No Typology Code available for payment source | Admitting: Radiation Oncology

## 2020-09-27 ENCOUNTER — Ambulatory Visit
Admission: RE | Admit: 2020-09-27 | Discharge: 2020-09-27 | Disposition: A | Discharge status: Home / Self Care | Payer: No Typology Code available for payment source | Source: Ambulatory Visit | Attending: Radiation Oncology | Admitting: Radiation Oncology

## 2020-09-27 ENCOUNTER — Ambulatory Visit: Payer: No Typology Code available for payment source | Admitting: Radiation Oncology

## 2020-09-27 NOTE — Progress Notes (Signed)
CARDIO-ONCOLOGY CLINIC NOTE  Referring Physician: Dr. Summer Cheadle Primary Care: Gildardo Pounds, NP  HPI:  Jill [name] is 45 y.o. female from [place] with right breast cancer referred by Dr. Jana Hakim for enrollment into the Cardio-Oncology program.  Oncology history:  (1) Diagnosed R breast CA 8/21. Status post right mastectomy and sentinel lymph node sampling 03/07/2020 for a pT2 pN1, stage IB invasive ductal carcinoma, grade 2, with negative margins.             (a) a total of 4 lymph nodes were removed, one positive  (2) adjuvant chemo immunotherapy with carboplatin, docetaxel, trastuzumab and pertuzumab every 21 days x 6 starting 03/29/2020             (a) pertuzumab discontinued after cycle 2 secondary to diarrhea  (3) trastuzumab to continue to complete a year  (4) XRT to finish 10/03/20  (5) tamoxifen started 02/12/2020 in anticipation of possible surgical delays, held during chemotherapy   She is here with Spanish interpreter. Doing well. Tolerating Herceptin well. Just about to finish XRT. Denies SOB, orthopnea or PND.   ECHO today 09/28/20: EF 55-60% Personally reviewed  ECHO 9/21: EF 60-65% ECHO 06/30/19 EF 65-70% Grade I DD. Reviewed personally.    Past Medical History:  Diagnosis Date  . Breast cancer ([place]) 03/07/2020  . Infected cyst of Bartholin's gland duct   . Medical history non-contributory     Current Outpatient Medications  Medication Sig Dispense Refill  . ibuprofen (ADVIL) 200 MG tablet Take 400 mg by mouth every 6 (six) hours as needed for headache or moderate pain.     No current facility-administered medications for this encounter.    No Known Allergies    Social History   Socioeconomic History  . Marital status: Single    Spouse name: Not on file  . Number of children: 4  . Years of education: Not on file  . Highest education level: 6th grade  Occupational History  . Not on file  Tobacco Use  . Smoking status: Never Smoker  .  Smokeless tobacco: Never Used  Vaping Use  . Vaping Use: Never used  Substance and Sexual Activity  . Alcohol use: Never  . Drug use: Never  . Sexual activity: Not Currently    Birth control/protection: I.U.D.  Other Topics Concern  . Not on file  Social History Narrative  . Not on file   Social Determinants of Health   Financial Resource Strain: Not on file  Food Insecurity: Not on file  Transportation Needs: No Transportation Needs  . Lack of Transportation (Medical): No  . Lack of Transportation (Non-Medical): No  Physical Activity: Not on file  Stress: Not on file  Social Connections: Not on file  Intimate Partner Violence: Not At Risk  . Fear of Current or Ex-Partner: No  . Emotionally Abused: No  . Physically Abused: No  . Sexually Abused: No      Family History  Problem Relation Age of Onset  . Diabetes Maternal Grandmother   . Hypertension Mother     Vitals:   09/28/20 1058  BP: 110/70  Pulse: 78  SpO2: 99%  Weight: 77 kg (169 lb 12.8 oz)    PHYSICAL EXAM: General:  Well appearing. No resp difficulty HEENT: normal Neck: supple. no JVD. + LIJ port Carotids 2+ bilat; no bruits. No lymphadenopathy or thryomegaly appreciated. Cor: PMI nondisplaced. Regular rate & rhythm. No rubs, gallops or murmurs. + radiation burn  Lungs: clear Abdomen: soft,  nontender, nondistended. No hepatosplenomegaly. No bruits or masses. Good bowel sounds. Extremities: no cyanosis, clubbing, rash, edema Neuro: alert & orientedx3, cranial nerves grossly intact. moves all 4 extremities w/o difficulty. Affect pleasant  ASSESSMENT & PLAN:  1. R Breast Cancer - ECHO 9/21: EF 60-65% - ECHO 06/30/19 EF 65-70% Grade I DD.  - ECHO today 09/28/20: EF 55-60% Personally reviewed - I reviewed echos personally. EF and Doppler parameters stable. No HF on exam. Continue Herceptin.    Glori Bickers, MD  11:34 AM

## 2020-09-28 ENCOUNTER — Ambulatory Visit (HOSPITAL_COMMUNITY)
Admission: RE | Admit: 2020-09-28 | Discharge: 2020-09-28 | Disposition: A | Discharge status: Home / Self Care | Payer: Self-pay | Source: Ambulatory Visit | Attending: Nurse Practitioner | Admitting: Nurse Practitioner

## 2020-09-28 ENCOUNTER — Encounter (HOSPITAL_COMMUNITY): Payer: Self-pay | Admitting: Internal Medicine

## 2020-09-28 ENCOUNTER — Ambulatory Visit (HOSPITAL_BASED_OUTPATIENT_CLINIC_OR_DEPARTMENT_OTHER)
Admission: RE | Admit: 2020-09-28 | Discharge: 2020-09-28 | Disposition: A | Discharge status: Home / Self Care | Payer: No Typology Code available for payment source | Source: Ambulatory Visit | Attending: Internal Medicine | Admitting: Internal Medicine

## 2020-09-28 ENCOUNTER — Ambulatory Visit
Admission: RE | Admit: 2020-09-28 | Discharge: 2020-09-28 | Disposition: A | Discharge status: Home / Self Care | Payer: No Typology Code available for payment source | Source: Ambulatory Visit | Attending: Radiation Oncology | Admitting: Radiation Oncology

## 2020-09-28 ENCOUNTER — Other Ambulatory Visit: Payer: Self-pay

## 2020-09-28 VITALS — BP 110/70 | HR 78 | Wt 169.8 lb

## 2020-09-28 DIAGNOSIS — I071 Rheumatic tricuspid insufficiency: Secondary | ICD-10-CM | POA: Insufficient documentation

## 2020-09-28 DIAGNOSIS — Z170 Estrogen receptor positive status [ER+]: Secondary | ICD-10-CM

## 2020-09-28 DIAGNOSIS — C50411 Malignant neoplasm of upper-outer quadrant of right female breast: Secondary | ICD-10-CM | POA: Insufficient documentation

## 2020-09-28 DIAGNOSIS — Z0189 Encounter for other specified special examinations: Secondary | ICD-10-CM

## 2020-09-28 LAB — ECHOCARDIOGRAM COMPLETE
Area-P 1/2: 3.36 cm2
Calc EF: 55.9 %
S' Lateral: 2.6 cm
Single Plane A2C EF: 56.4 %
Single Plane A4C EF: 57.1 %

## 2020-09-28 NOTE — Progress Notes (Signed)
  Echocardiogram 2D Echocardiogram has been performed.  Jill [name] 09/28/2020, 10:40 AM

## 2020-09-28 NOTE — Patient Instructions (Signed)
Seguimiento en 3 meses con un ecocardiograma  Llame a nuestra oficina si tiene problemas o inquietudes al [phone]

## 2020-09-28 NOTE — Addendum Note (Signed)
Encounter addended by: Jolaine Artist, MD on: 09/28/2020 12:43 PM  Actions taken: Level of Service modified

## 2020-09-29 ENCOUNTER — Other Ambulatory Visit: Payer: Self-pay

## 2020-09-29 ENCOUNTER — Ambulatory Visit
Admission: RE | Admit: 2020-09-29 | Discharge: 2020-09-29 | Disposition: A | Discharge status: Home / Self Care | Payer: No Typology Code available for payment source | Source: Ambulatory Visit | Attending: Radiation Oncology | Admitting: Radiation Oncology

## 2020-09-29 DIAGNOSIS — Z170 Estrogen receptor positive status [ER+]: Secondary | ICD-10-CM

## 2020-09-29 NOTE — Progress Notes (Signed)
Referral placed to Dr [name] per Wilber Bihari, NP. This LPN contacted pt's interpreter, Almyra Free to have her call pt to inform her that her MRI was negative for cancer, and referral will be placed to sports medicine. Almyra Free states she will call pt to inform.

## 2020-09-30 ENCOUNTER — Other Ambulatory Visit: Payer: Self-pay

## 2020-09-30 ENCOUNTER — Ambulatory Visit: Payer: No Typology Code available for payment source

## 2020-09-30 ENCOUNTER — Ambulatory Visit
Admission: RE | Admit: 2020-09-30 | Discharge: 2020-09-30 | Disposition: A | Discharge status: Home / Self Care | Payer: No Typology Code available for payment source | Source: Ambulatory Visit | Attending: Radiation Oncology | Admitting: Radiation Oncology

## 2020-09-30 DIAGNOSIS — C50411 Malignant neoplasm of upper-outer quadrant of right female breast: Secondary | ICD-10-CM

## 2020-09-30 MED ORDER — RADIAPLEXRX EX GEL
Freq: Once | CUTANEOUS | Status: AC
Start: 2020-09-30 — End: 2020-09-30

## 2020-09-30 NOTE — Progress Notes (Signed)
I, [name], LAT, ATC acting as a scribe for [name], MD.  Subjective:    I'm seeing this patient as a consultation for [employer]. Note will be routed back to referring provider/PCP.  CC: Low back pain  HPI: Pt is a 45 y/o female c/o low back pain that radiates into bilat legs to feet and hands. Pt locates pain to midline of low back especially painful when seated. Of note, pt is currently being treated for R breast cancer. Pt notes she was diagnosed with bilateral pars defects and lumbar spine.  Additionally sacrum MRI showed sacroiliitis.  She notes pain in the low back and in the coccyx and sacrum midline.  This is worse with sitting and better with standing.  She denies radiating pain down her legs but does note pain in her feet.  LE numbness/tingling: no Aggravates: sitting, walking Treatments tried: none  Dx imaging: 09/24/20 L-spine & sacrum MRI  Past medical history, Surgical history, Family history, Social history, Allergies, and medications have been entered into the medical record, reviewed.   Review of Systems: No new headache, visual changes, nausea, vomiting, diarrhea, constipation, dizziness, abdominal pain, skin rash, fevers, chills, night sweats, weight loss, swollen lymph nodes, body aches, joint swelling, muscle aches, chest pain, shortness of breath, mood changes, visual or auditory hallucinations.   Objective:    Vitals:   10/03/20 1420  BP: 124/86   General: Well Developed, well nourished, and in no acute distress.  Neuro/Psych: Alert and oriented x3, extra-ocular muscles intact, able to move all 4 extremities, sensation grossly intact. Skin: Warm and dry, no rashes noted.  Respiratory: Not using accessory muscles, speaking in full sentences, trachea midline.  Cardiovascular: Pulses palpable, no extremity edema. Abdomen: Does not appear distended. MSK:  L-spine normal-appearing Nontender lumbar midline. Normal lumbar motion. Lower extremity  strength is intact. Reflexes are intact.  Sacrum and coccyx: Normal-appearing Tender palpation midline sacrum and coccyx. Mildly tender palpation SI joint. Hip motion is intact    Lab and Radiology Results  EXAM: MRI LUMBAR SPINE WITHOUT AND WITH CONTRAST  TECHNIQUE: Multiplanar and multiecho pulse sequences of the lumbar spine were obtained without and with intravenous contrast.  CONTRAST:  53mL GADAVIST GADOBUTROL 1 MMOL/ML IV SOLN  COMPARISON:  CT abdomen and pelvis from September 06, 2020.  FINDINGS: Segmentation: Standard segmentation is assumed. The inferior-most fully formed intervertebral disc is labeled L5-S1. In  Alignment: Grade 1 anterolisthesis of L5 on S1.  Vertebrae: Vertebral body heights are maintained. No specific evidence of acute fracture, discitis/osteomyelitis, or suspicious bone lesion. No abnormal enhancing lesion identified. Bilateral L5 pars defects, confirmed on prior CT abdomen and pelvis.  Conus medullaris and cauda equina: Conus extends to the superior L2 level. Conus appears normal. No evidence of abnormal enhancement of the conus or cauda quinine  Paraspinal and other soft tissues: Mild perifacet inflammatory/degenerative arthropathy at L5-S1. Otherwise, unremarkable.  Disc levels:  T11-T12: Only imaged sagittally without significant canal or foraminal stenosis.  T12-L1: No significant disc protrusion, foraminal stenosis, or canal stenosis.  L1-L2: No significant disc protrusion, foraminal stenosis, or canal stenosis.  L2-L3: No significant disc protrusion, foraminal stenosis, or canal stenosis.  L3-L4: No significant disc protrusion, foraminal stenosis, or canal stenosis.  L4-L5: No significant disc protrusion, foraminal stenosis, or canal stenosis.  L5-S1: Bilateral pars defects. Grade 1 anterolisthesis of L5 on S1. Disc desiccation. Bilateral facet hypertrophy/arthropathy. Mildly prominent dorsal epidural.  Uncovering of the disc with superimposed broad disc bulge and small  left foraminal annular fissure. Resulting mild bilateral foraminal stenosis. The canal is mildly narrowed in transverse dimension.  IMPRESSION: 1. No evidence of osseous metastatic disease in the lumbar spine. 2. 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 Resulting degenerative disc disease and facet arthropathy at this level with mild canal and bilateral foraminal stenosis.   Electronically Signed   By: Margaretha Sheffield MD   On: 09/26/2020 10:26  EXAM: MRI SACRUM WITH AND WITHOUT CONTRAST  TECHNIQUE: Multiplanar multi-sequence MR imaging of the sacrum was performed before and after IV contrast administration.  CONTRAST:  CONTRAST 7 cc Gadavist  COMPARISON:  CT pelvis 09/06/2020  FINDINGS: Urinary Tract:  Unremarkable  Bowel:  Unremarkable  Vascular/Lymphatic: Small bilateral inguinal lymph nodes are not pathologically enlarged.  Reproductive: T-shaped IUD satisfactorily positioned along the endometrium. Right Bartholin cyst noted.  Other:  No supplemental non-categorized findings.  Musculoskeletal: Symmetric reduced T1 and T2 signal along both sacroiliac joints reflecting the sclerosis seen on CT, without significant abnormal marrow edema or well-defined erosions. No effusion of the SI joints noted. Typical enhancement of the synovium observed.  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  There is no impinging lesion along the sacral plexus or visualized proximal sciatic nerves. No abnormal enhancement or edema signal in the sciatic nerves.  No findings of regional pelvic metastatic disease.  IMPRESSION: 1. Chronic bilateral sacroiliitis, with sclerosis along the SI joints but no current edema, erosion, or effusion. 2. No findings of metastatic disease to the visualized pelvis. 3. Incidental IUD along the endometrium.  Incidental Bartholin cyst. 4. Pars  defects at L5, better shown on recent lumbar spine MRI.   Electronically Signed   By: Van Clines M.D.   On: 09/26/2020 19:34  I, [name], personally (independently) visualized and performed the interpretation of the images attached in this note.  Procedure: Real-time Ultrasound Guided Injection of right SI joint Device: Philips Affiniti 50G Images permanently stored and available for review in PACS Verbal informed consent obtained.  Discussed risks and benefits of procedure. Warned about infection bleeding damage to structures skin hypopigmentation and fat atrophy among others. Patient expresses understanding and agreement Time-out conducted.   Noted no overlying erythema, induration, or other signs of local infection.   Skin prepped in a sterile fashion.   Local anesthesia: Topical Ethyl chloride.   With sterile technique and under real time ultrasound guidance:  40 mg of Kenalog and 2 L of Marcaine injected into SI joint. Fluid seen entering the joint capsule.   Completed without difficulty   Pain immediately resolved suggesting accurate placement of the medication.   Advised to call if fevers/chills, erythema, induration, drainage, or persistent bleeding.   Images permanently stored and available for review in the ultrasound unit.  Impression: Technically successful ultrasound guided injection.   Procedure: Real-time Ultrasound Guided Injection of left SI joint Device: Philips Affiniti 50G Images permanently stored and available for review in PACS Verbal informed consent obtained.  Discussed risks and benefits of procedure. Warned about infection bleeding damage to structures skin hypopigmentation and fat atrophy among others. Patient expresses understanding and agreement Time-out conducted.   Noted no overlying erythema, induration, or other signs of local infection.   Skin prepped in a sterile fashion.   Local anesthesia: Topical Ethyl chloride.   With sterile  technique and under real time ultrasound guidance:  40 mg of Kenalog and 2 mL of Marcaine injected into SI joint. Fluid seen entering the joint  capsule.   Completed without difficulty   Pain immediately resolved suggesting accurate placement of the medication.   Advised to call if fevers/chills, erythema, induration, drainage, or persistent bleeding.   Images permanently stored and available for review in the ultrasound unit.  Impression: Technically successful ultrasound guided injection.          Impression and Recommendations:    Assessment and Plan: 45 y.o. female with multifactorial low back and sacral pain.  Predominant pain seems to be coxalgia.  Discussed sacrum and coccyx cushions.  Additionally patient has sacroiliitis seen on MRI.  She is tender in this region and that was treated with SI joint injections.  She does have spondylolisthesis with pars defect seen on MRI.  It is debatable how much of this is actually causing her back pain.  Usually this would produce pain that is worse with standing and better with sitting.  Her pain pattern is the opposite.  We will address this with physical therapy referral.  Recheck in 1 month.  Of note today's visit conducted using a Spanish interpreter.  PDMP not reviewed this encounter. Orders Placed This Encounter  Procedures  . [place] LIMITED JOINT SPACE STRUCTURES LOW BILAT(NO LINKED CHARGES)    Standing Status:   Future    Number of Occurrences:   1    Standing Expiration Date:   04/04/2021    Order Specific Question:   Reason for Exam (SYMPTOM  OR DIAGNOSIS REQUIRED)    Answer:   low back pain    Order Specific Question:   Preferred imaging location?    Answer:   [facility]  . Ambulatory referral to Physical Therapy    Referral Priority:   Routine    Referral Type:   Physical Medicine    Referral Reason:   Specialty Services Required    Requested Specialty:   Physical Therapy   No orders of the  defined types were placed in this encounter.   Discussed warning signs or symptoms. Please see discharge instructions. Patient expresses understanding.   The above documentation has been reviewed and is accurate and complete [name], M.D.

## 2020-10-03 ENCOUNTER — Encounter: Payer: Self-pay | Admitting: *Deleted

## 2020-10-03 ENCOUNTER — Ambulatory Visit: Payer: Self-pay

## 2020-10-03 ENCOUNTER — Ambulatory Visit (INDEPENDENT_AMBULATORY_CARE_PROVIDER_SITE_OTHER): Payer: Self-pay | Admitting: Family Medicine

## 2020-10-03 ENCOUNTER — Ambulatory Visit
Admission: RE | Admit: 2020-10-03 | Discharge: 2020-10-03 | Disposition: A | Discharge status: Home / Self Care | Payer: No Typology Code available for payment source | Source: Ambulatory Visit | Attending: Radiation Oncology | Admitting: Radiation Oncology

## 2020-10-03 ENCOUNTER — Encounter: Payer: Self-pay | Admitting: Radiation Oncology

## 2020-10-03 ENCOUNTER — Other Ambulatory Visit: Payer: Self-pay

## 2020-10-03 VITALS — BP 124/86 | Ht <= 58 in | Wt 168.2 lb

## 2020-10-03 DIAGNOSIS — M4306 Spondylolysis, lumbar region: Secondary | ICD-10-CM

## 2020-10-03 DIAGNOSIS — M5441 Lumbago with sciatica, right side: Secondary | ICD-10-CM

## 2020-10-03 DIAGNOSIS — M533 Sacrococcygeal disorders, not elsewhere classified: Secondary | ICD-10-CM

## 2020-10-03 DIAGNOSIS — M5442 Lumbago with sciatica, left side: Secondary | ICD-10-CM

## 2020-10-03 NOTE — Patient Instructions (Addendum)
Thank you for coming in today.  Regresse con 1 mese  (Return in 1 month)  Coccyx Cushion   Lesin en el coxis Tailbone Injury El coxis es el hueso pequeo que se encuentra en el extremo inferior de la columna vertebral. El coxis puede lesionarse debido a:  Engineer, manufacturing.  Estar sentado para Product/process development scientist o andar en bicicleta durante mucho tiempo.  Tener un beb. Este tipo de lesin puede ser dolorosa. La mayora de las lesiones en el coxis mejoran por s solas en el trmino de 4 a 6semanas. Siga estas indicaciones en su casa: Actividad  Government social research officer sentado en la misma posicin durante [employer].  Use protectores y equipo deportivo adecuado cuando ande en bicicleta o reme.  Aumente la actividad a medida que el dolor se lo permita.  Haga ejercicios como se lo hayan indicado el mdico o el fisioterapeuta. Control del dolor, la rigidez y la hinchazn  Para aliviar el dolor: ? Sintese sobre un aro o un almohadn grande de goma o inflable. ? Inclnese hacia adelante al sentarse.  Si se lo indican, aplique hielo sobre la zona lesionada. ? Ponga el hielo en una bolsa plstica. ? Coloque una [employer] piel y la bolsa de hielo. ? Coloque el hielo durante 55minutos, de 2a3veces por da. Hgalo durante los primeros 1 o 2das.  Aplique calor sobre la zona lesionada, si se lo indican. Hgalo con la frecuencia que le haya indicado el mdico. Use la fuente de calor que el mdico le recomiende, como una compresa de calor hmedo o una almohadilla trmica. ? Coloque una [employer] piel y la fuente de Freight forwarder. ? Aplique el calor durante 20 a 54minutos. ? Retire la fuente de calor si la piel se pone de color rojo brillante. Esto es muy importante si no puede Education officer, environmental, calor o fro. Puede correr un riesgo mayor de sufrir quemaduras. Instrucciones generales  [employer] de venta libre y los recetados solamente como se lo haya indicado el mdico.  Para prevenir o  tratar problemas para defecar (estreimiento) o dolor al defecar, el mdico puede indicarle que: ? Beba suficiente lquido para Theatre manager la orina de color amarillo plido. ? Coma alimentos ricos en fibra. Entre ellos, frutas y verduras frescas, cereales integrales y frijoles. ? Limite los alimentos con alto contenido de [place] y Location manager. Estos incluyen alimentos fritos y dulces. ? Tome un medicamento de venta libre o recetado a fin de tratar el problema para defecar.  Concurra a todas las visitas de seguimiento como se lo haya indicado el mdico. Esto es importante. Comunquese con un mdico si:  El Holiday representative.  Defecar le Nurse, mental health.  No puede defecar luego de 4 das.  Siente dolor durante las [employer]. Resumen  Una lesin en el coxis puede ocurrir por una cada, por estar sentado durante mucho tiempo para remar o Catering manager, o despus de tener un beb.  Estas lesiones pueden ser dolorosas. La mayora de las lesiones en el coxis mejoran por s solas en el trmino de 4 a 6semanas.  Sintese sobre un aro de goma o inflable o un cojn grande para Best boy.  Evite permanecer sentado en la misma posicin durante [employer].  Siga las sugerencias del mdico en lo que respecta a prevenir o tratar los problemas para Landscape architect. Esta informacin no tiene Marine scientist el consejo del mdico. Asegrese de hacerle al mdico cualquier pregunta que tenga. Document Revised: 08/21/2017 Document  Reviewed: 08/21/2017 Elsevier Patient Education  Klein Spondylolysis  La espondilolisis es una pequea rotura o grieta (fractura por sobrecarga) en un hueso de la columna (vrtebra) en la parte inferior de la espalda (columna lumbar). La fractura por sobrecarga ocurre en la masa sea entre y detrs de las vrtebras. La causa de la espondilolisis puede ser una lesin (traumatismo) o el uso excesivo. Debido a que la parte inferior de la  espalda est casi siempre sometida a la presin del diario vivir, esta fractura por sobrecarga, por lo general, no se cura normalmente. Con el tiempo, la espondilolisis puede provocar que una vrtebra se deslice hacia adelante y fuera de Environmental consultant (espndilolistesis). Cules son las causas? Esta afeccin puede ser causada por lo siguiente:  Traumatismo, por ejemplo, una cada.  Desgaste por el uso excesivo. A menudo, esto es consecuencia de Retail buyer o actividades fsicas que implican un estiramiento excesivo y repetitivo (hiperextensin), y rotacin de la columna. Qu incrementa el riesgo?  Es ms probable que tenga esta afeccin si presenta: ? Antecedentes familiares de esta afeccin. ? Una curvatura hacia adentro de la columna (lordosis). ? Una afeccin que involucra la columna vertebral, como espina bfida. Es ms probable que desarrolle esta afeccin si practica:  Gimnasia.  Baile.  Ftbol americano.  Lucha.  Artes marciales.  Levantar peso.  Tenis.  Natacin. Cules son los signos o los sntomas? Los sntomas de esta afeccin pueden incluir los siguientes:  Dolor prolongado (crnico) en la parte inferior de la espalda.  Rigidez en la espalda o las piernas.  Tirantez en los msculos isquiotibiales, que se encuentran en la parte posterior de los muslos. En algunos casos, puede no haber sntomas de esta afeccin. Cmo se diagnostica? Esta afeccin se puede diagnosticar en funcin de lo siguiente:  Sus sntomas.  Sus antecedentes mdicos.  Un examen fsico.  Pruebas de diagnstico por imgenes, por ejemplo: ? Radiografas. ? Exploracin por tomografa computarizada (TC). ? Una resonancia magntica (RM). Cmo se trata? El tratamiento para esta afeccin puede incluir lo siguiente:  Hacer reposo. Posiblemente le pidan que evite o modifique actividades que sobrecargan la espalda hasta que los sntomas mejoren.  Medicamentos para Psychologist, prison and probation services.  Tomar antiinflamatorios no esteroideos Dayna Ramus) para reducir la hinchazn y [employer].  Recibir inyecciones de medicamentos (corticoides) en la espalda. Estas inyecciones pueden ayudar a Best boy y el adormecimiento.  Usar un dispositivo ortopdico para Passenger transport manager y Teacher, music.  Realizar fisioterapia. Puede trabajar con un terapeuta ocupacional o un fisioterapeuta, que pueden ensearle a reducir la presin en la espalda mientras hace las actividades cotidianas.  Clementeen Hoof. Esta puede ser necesaria en las siguientes situaciones: ? Lesin grave. ? Dolor que persiste durante ms de 21meses. ? Adormecimiento en la regin plvica. ? Cambios en el control de las heces o la orina. Siga estas indicaciones en su casa: Medicamentos  [employer] de venta libre y los recetados solamente como se lo haya indicado el mdico.  Pregntele al mdico si el medicamento recetado: ? Hace que sea necesario que evite conducir o usar maquinaria pesada. ? Puede causarle estreimiento. Es posible que tenga que tomar estas medidas para prevenir o tratar el estreimiento:  Beba suficiente lquido como para Theatre manager la orina de color amarillo plido.  Tome medicamentos recetados o de venta [place].  Consuma alimentos ricos en fibra, como frijoles, cereales integrales, y frutas y verduras frescas.  Limite el consumo de alimentos ricos en  grasa y azcares procesados, como los alimentos fritos o dulces. Si tiene un dispositivo ortopdico:  selo como se lo haya indicado el mdico. Quteselo solamente como se lo haya indicado el mdico.  Mantenga limpio el dispositivo ortopdico.  Si el dispositivo ortopdico no es impermeable: ? No deje que se moje. ? Cbralo con un envoltorio hermtico cuando tome un bao de inmersin o [place]. Actividad  Descanse y retome sus actividades normales como se lo haya indicado el mdico. Pregntele al mdico qu actividades son  seguras para usted.  Pregntele al mdico cundo puede volver a Forensic psychologist, si tiene un dispositivo ortopdico en la espalda.  Trabaje con un fisioterapeuta para crear un programa de ejercicios seguros, segn lo recomiende el mdico. Haga ejercicios como se lo haya indicado el fisioterapeuta. Esto puede incluir ejercicios para fortalecer la espalda y los msculos abdominales (ejercicios para fortalecer los msculos del tronco). Control del dolor, la rigidez y la hinchazn  Si se lo indican, aplique hielo sobre la zona afectada. ? Si tiene un dispositivo ortopdico desmontable, quteselo como se lo haya indicado el mdico. ? Ponga el hielo en una bolsa plstica. ? Coloque una [employer] piel y Therapist, nutritional. ? Coloque el hielo durante 85minutos, 2 a 3veces por da.  Si se lo indican, aplique calor en la zona afectada con la frecuencia que le haya indicado el mdico. Use la fuente de calor que el mdico le recomiende, como una compresa de calor hmedo o una almohadilla trmica. ? Si tiene un dispositivo ortopdico desmontable, quteselo como se lo haya indicado el mdico. ? Colquese una toalla entre la piel y la fuente de Freight forwarder. ? Aplique calor durante 20 a 25minutos. ? Retire la fuente de calor si la piel se pone de color rojo brillante. Esto es especialmente importante si no puede sentir dolor, calor o fro. Puede correr un riesgo mayor de sufrir quemaduras.      Indicaciones generales  No consuma ningn producto que contenga nicotina o tabaco, como cigarrillos, cigarrillos electrnicos y tabaco de Higher education careers adviser. Estos pueden retrasar la consolidacin del [place]. Si necesita ayuda para dejar de fumar, consulte al mdico.  Mantenga un peso saludable. El peso excesivo ejerce tensin sobre la espalda.  Concurra a todas las visitas de seguimiento como se lo haya indicado el mdico. Esto es importante. Comunquese con un mdico si:  Tiene un dolor que empeora o que no mejora. Solicite ayuda  inmediatamente si:  Siente dolor intenso en la espalda.  Tiene cambios en el control de las heces o la orina.  Siente debilidad o adormecimiento en las piernas.  Nota que no puede pararse o caminar. Resumen  La espondilolisis es una pequea rotura o grieta (fractura por sobrecarga) en un hueso de la columna (vrtebra) en la parte inferior de la espalda (columna lumbar).  Esta afeccin puede tratarse con reposo, medicamentos, fisioterapia, el uso de un dispositivo ortopdico o [place].  Descanse y retome sus actividades normales como se lo haya indicado el mdico. Pregntele al mdico qu actividades son seguras para usted.  Comunquese con el mdico si tiene dolor que empeora o no mejora. Esta informacin no tiene Marine scientist el consejo del mdico. Asegrese de hacerle al mdico cualquier pregunta que tenga. Document Revised: 02/11/2018 Document Reviewed: 02/11/2018 Elsevier Patient Education  2021 [employer].

## 2020-10-03 NOTE — Progress Notes (Signed)
                                                                                                                                                             Patient Name: Jill [name] MRN: [id number] DOB: Jul 22, 1975 Referring Physician: Lurline Del (Profile Not Attached) Date of Service: 10/03/2020 [facility], [place]                                                        End Of Treatment Note  Diagnoses: C50.411-Malignant neoplasm of upper-outer quadrant of right female breast  Cancer Staging: Stage IB, [id number] grade 2, triple positive invasive ductal carcinoma of the right breast   Intent: Curative  Radiation Treatment Dates: 08/17/2020 through 10/03/2020 Site Technique Total Dose (Gy) Dose per Fx (Gy) Completed Fx Beam Energies  Chest Wall, Right: CW_Rt 3D 50.4/50.4 1.8 28/28 6X, 10X  Chest Wall, Right: CW_Rt_SCLV 3D 50.4/50.4 1.8 28/28 6X, 10X  Chest Wall, Right: CW_Rt_Bst Electron 10/10 2 5/5 6E   Narrative: The patient tolerated radiation therapy relatively well. She noted tenderness and burning sensation as well as mild skin peeling at the conclusion of treatment as well as fatigue.    Plan: The patient will receive a call in about one month from the radiation oncology department. She will continue follow up with Dr. Jana Hakim as well.   ________________________________________________    Carola Rhine, [facility]

## 2020-10-05 ENCOUNTER — Inpatient Hospital Stay: Payer: No Typology Code available for payment source | Attending: Oncology

## 2020-10-05 ENCOUNTER — Inpatient Hospital Stay: Payer: No Typology Code available for payment source

## 2020-10-05 ENCOUNTER — Other Ambulatory Visit: Payer: Self-pay

## 2020-10-05 VITALS — BP 113/74 | HR 63 | Temp 98.6°F | Resp 18

## 2020-10-05 DIAGNOSIS — Z79810 Long term (current) use of selective estrogen receptor modulators (SERMs): Secondary | ICD-10-CM | POA: Insufficient documentation

## 2020-10-05 DIAGNOSIS — C50411 Malignant neoplasm of upper-outer quadrant of right female breast: Secondary | ICD-10-CM | POA: Insufficient documentation

## 2020-10-05 DIAGNOSIS — Z5112 Encounter for antineoplastic immunotherapy: Secondary | ICD-10-CM | POA: Insufficient documentation

## 2020-10-05 DIAGNOSIS — Z79899 Other long term (current) drug therapy: Secondary | ICD-10-CM | POA: Insufficient documentation

## 2020-10-05 DIAGNOSIS — Z9011 Acquired absence of right breast and nipple: Secondary | ICD-10-CM | POA: Insufficient documentation

## 2020-10-05 DIAGNOSIS — Z170 Estrogen receptor positive status [ER+]: Secondary | ICD-10-CM | POA: Insufficient documentation

## 2020-10-05 LAB — COMPREHENSIVE METABOLIC PANEL
ALT: 21 U/L (ref 0–44)
AST: 20 U/L (ref 15–41)
Albumin: 4.3 g/dL (ref 3.5–5.0)
Alkaline Phosphatase: 95 U/L (ref 38–126)
Anion gap: 13 (ref 5–15)
BUN: 11 mg/dL (ref 6–20)
CO2: 24 mmol/L (ref 22–32)
Calcium: 9.3 mg/dL (ref 8.9–10.3)
Chloride: 107 mmol/L (ref 98–111)
Creatinine, Ser: 0.68 mg/dL (ref 0.44–1.00)
GFR, Estimated: >60 mL/min (ref >60)
Glucose, Bld: 129 mg/dL — ABNORMAL HIGH (ref 70–99)
Potassium: 3.7 mmol/L (ref 3.5–5.1)
Sodium: 144 mmol/L (ref 135–145)
Total Bilirubin: 0.4 mg/dL (ref 0.3–1.2)
Total Protein: 7.6 g/dL (ref 6.5–8.1)

## 2020-10-05 LAB — CBC WITH DIFFERENTIAL/PLATELET
Abs Immature Granulocytes: 0.01 10*3/uL (ref 0.00–0.07)
Basophils Absolute: 0 10*3/uL (ref 0.0–0.1)
Basophils Relative: 1 %
Eosinophils Absolute: 0 10*3/uL (ref 0.0–0.5)
Eosinophils Relative: 0 %
HCT: 38.7 % (ref 36.0–46.0)
Hemoglobin: 13.3 g/dL (ref 12.0–15.0)
Immature Granulocytes: 0 %
Lymphocytes Relative: 14 %
Lymphs Abs: 0.5 10*3/uL — ABNORMAL LOW (ref 0.7–4.0)
MCH: 31.5 pg (ref 26.0–34.0)
MCHC: 34.4 g/dL (ref 30.0–36.0)
MCV: 91.7 fL (ref 80.0–100.0)
Monocytes Absolute: 0.2 10*3/uL (ref 0.1–1.0)
Monocytes Relative: 4 %
Neutro Abs: 3 10*3/uL (ref 1.7–7.7)
Neutrophils Relative %: 81 %
Platelets: 307 10*3/uL (ref 150–400)
RBC: 4.22 MIL/uL (ref 3.87–5.11)
RDW: 11.7 % (ref 11.5–15.5)
WBC: 3.7 10*3/uL — ABNORMAL LOW (ref 4.0–10.5)
nRBC: 0 % (ref 0.0–0.2)

## 2020-10-05 MED ORDER — HEPARIN SOD (PORK) LOCK FLUSH 100 UNIT/ML IV SOLN
500.0000 [IU] | Freq: Once | INTRAVENOUS | Status: AC | PRN
  Administered 2020-10-05: 500 [IU]
  Filled 2020-10-05: qty 5

## 2020-10-05 MED ORDER — ACETAMINOPHEN 325 MG PO TABS
ORAL_TABLET | ORAL | Status: AC
  Filled 2020-10-05: qty 2

## 2020-10-05 MED ORDER — TRASTUZUMAB-ANNS CHEMO 150 MG IV SOLR
6.0000 mg/kg | Freq: Once | INTRAVENOUS | Status: AC
  Administered 2020-10-05: 420 mg via INTRAVENOUS
  Filled 2020-10-05: qty 20

## 2020-10-05 MED ORDER — ACETAMINOPHEN 325 MG PO TABS
650.0000 mg | ORAL_TABLET | Freq: Once | ORAL | Status: AC
  Administered 2020-10-05: 650 mg via ORAL

## 2020-10-05 MED ORDER — DIPHENHYDRAMINE HCL 25 MG PO CAPS
ORAL_CAPSULE | ORAL | Status: AC
  Filled 2020-10-05: qty 2

## 2020-10-05 MED ORDER — DIPHENHYDRAMINE HCL 25 MG PO CAPS
25.0000 mg | ORAL_CAPSULE | Freq: Once | ORAL | Status: AC
  Administered 2020-10-05: 25 mg via ORAL

## 2020-10-05 MED ORDER — SODIUM CHLORIDE 0.9 % IV SOLN
Freq: Once | INTRAVENOUS | Status: AC
  Filled 2020-10-05: qty 250

## 2020-10-05 MED ORDER — SODIUM CHLORIDE 0.9% FLUSH
10.0000 mL | INTRAVENOUS | Status: DC | PRN
  Administered 2020-10-05: 10 mL
  Filled 2020-10-05: qty 10

## 2020-10-05 NOTE — Patient Instructions (Signed)
[facility] Discharge Instructions for Patients Receiving Chemotherapy  Today you received the following chemotherapy agents trastuzumab.  To help prevent nausea and vomiting after your treatment, we encourage you to take your nausea medication as directed.    If you develop nausea and vomiting that is not controlled by your nausea medication, call the clinic.   BELOW ARE SYMPTOMS THAT SHOULD BE REPORTED IMMEDIATELY:  *FEVER GREATER THAN 100.5 F  *CHILLS WITH OR WITHOUT FEVER  NAUSEA AND VOMITING THAT IS NOT CONTROLLED WITH YOUR NAUSEA MEDICATION  *UNUSUAL SHORTNESS OF BREATH  *UNUSUAL BRUISING OR BLEEDING  TENDERNESS IN MOUTH AND THROAT WITH OR WITHOUT PRESENCE OF ULCERS  *URINARY PROBLEMS  *BOWEL PROBLEMS  UNUSUAL RASH Items with * indicate a potential emergency and should be followed up as soon as possible.  Feel free to call the clinic should you have any questions or concerns. The clinic phone number is [phone].  Please show the CHEMO ALERT CARD at check-in to the Emergency Department and triage nurse.   

## 2020-10-05 NOTE — Patient Instructions (Signed)

## 2020-10-18 ENCOUNTER — Encounter: Payer: Self-pay | Admitting: Oncology

## 2020-10-18 NOTE — Progress Notes (Signed)
GFE(Good Faith Estimate) created and printed to provide to patient. Patient has 100% discount 09/29/20 - 03/31/21 per hospital account notes.

## 2020-10-26 ENCOUNTER — Other Ambulatory Visit: Payer: No Typology Code available for payment source

## 2020-10-26 ENCOUNTER — Inpatient Hospital Stay: Payer: Self-pay

## 2020-10-26 ENCOUNTER — Encounter: Payer: Self-pay | Admitting: Oncology

## 2020-10-26 ENCOUNTER — Other Ambulatory Visit: Payer: Self-pay

## 2020-10-26 ENCOUNTER — Inpatient Hospital Stay: Payer: Self-pay | Attending: Oncology

## 2020-10-26 VITALS — BP 95/64 | HR 78 | Temp 98.9°F | Resp 18 | Ht <= 58 in | Wt 166.5 lb

## 2020-10-26 DIAGNOSIS — C50411 Malignant neoplasm of upper-outer quadrant of right female breast: Secondary | ICD-10-CM

## 2020-10-26 DIAGNOSIS — Z170 Estrogen receptor positive status [ER+]: Secondary | ICD-10-CM

## 2020-10-26 DIAGNOSIS — Z79899 Other long term (current) drug therapy: Secondary | ICD-10-CM | POA: Insufficient documentation

## 2020-10-26 DIAGNOSIS — Z79810 Long term (current) use of selective estrogen receptor modulators (SERMs): Secondary | ICD-10-CM | POA: Insufficient documentation

## 2020-10-26 DIAGNOSIS — C773 Secondary and unspecified malignant neoplasm of axilla and upper limb lymph nodes: Secondary | ICD-10-CM | POA: Insufficient documentation

## 2020-10-26 DIAGNOSIS — Z5112 Encounter for antineoplastic immunotherapy: Secondary | ICD-10-CM | POA: Insufficient documentation

## 2020-10-26 DIAGNOSIS — Z9011 Acquired absence of right breast and nipple: Secondary | ICD-10-CM | POA: Insufficient documentation

## 2020-10-26 LAB — COMPREHENSIVE METABOLIC PANEL
ALT: 29 U/L (ref 0–44)
AST: 26 U/L (ref 15–41)
Albumin: 3.9 g/dL (ref 3.5–5.0)
Alkaline Phosphatase: 84 U/L (ref 38–126)
Anion gap: 8 (ref 5–15)
BUN: 12 mg/dL (ref 6–20)
CO2: 29 mmol/L (ref 22–32)
Calcium: 9.2 mg/dL (ref 8.9–10.3)
Chloride: 105 mmol/L (ref 98–111)
Creatinine, Ser: 0.72 mg/dL (ref 0.44–1.00)
GFR, Estimated: >60 mL/min (ref >60)
Glucose, Bld: 87 mg/dL (ref 70–99)
Potassium: 4.2 mmol/L (ref 3.5–5.1)
Sodium: 142 mmol/L (ref 135–145)
Total Bilirubin: 0.5 mg/dL (ref 0.3–1.2)
Total Protein: 6.9 g/dL (ref 6.5–8.1)

## 2020-10-26 LAB — CBC WITH DIFFERENTIAL/PLATELET
Abs Immature Granulocytes: 0.01 10*3/uL (ref 0.00–0.07)
Basophils Absolute: 0 10*3/uL (ref 0.0–0.1)
Basophils Relative: 1 %
Eosinophils Absolute: 0 10*3/uL (ref 0.0–0.5)
Eosinophils Relative: 1 %
HCT: 40.3 % (ref 36.0–46.0)
Hemoglobin: 13.7 g/dL (ref 12.0–15.0)
Immature Granulocytes: 0 %
Lymphocytes Relative: 26 %
Lymphs Abs: 0.8 10*3/uL (ref 0.7–4.0)
MCH: 31.1 pg (ref 26.0–34.0)
MCHC: 34 g/dL (ref 30.0–36.0)
MCV: 91.6 fL (ref 80.0–100.0)
Monocytes Absolute: 0.2 10*3/uL (ref 0.1–1.0)
Monocytes Relative: 8 %
Neutro Abs: 2 10*3/uL (ref 1.7–7.7)
Neutrophils Relative %: 64 %
Platelets: 320 10*3/uL (ref 150–400)
RBC: 4.4 MIL/uL (ref 3.87–5.11)
RDW: 12.5 % (ref 11.5–15.5)
WBC: 3.1 10*3/uL — ABNORMAL LOW (ref 4.0–10.5)
nRBC: 0 % (ref 0.0–0.2)

## 2020-10-26 MED ORDER — HEPARIN SOD (PORK) LOCK FLUSH 100 UNIT/ML IV SOLN
500.0000 [IU] | Freq: Once | INTRAVENOUS | Status: AC | PRN
  Administered 2020-10-26: 500 [IU]
  Filled 2020-10-26: qty 5

## 2020-10-26 MED ORDER — DIPHENHYDRAMINE HCL 25 MG PO CAPS
25.0000 mg | ORAL_CAPSULE | Freq: Once | ORAL | Status: AC
  Administered 2020-10-26: 25 mg via ORAL

## 2020-10-26 MED ORDER — TRASTUZUMAB-ANNS CHEMO 150 MG IV SOLR
6.0000 mg/kg | Freq: Once | INTRAVENOUS | Status: AC
  Administered 2020-10-26: 420 mg via INTRAVENOUS
  Filled 2020-10-26: qty 20

## 2020-10-26 MED ORDER — ACETAMINOPHEN 325 MG PO TABS
650.0000 mg | ORAL_TABLET | Freq: Once | ORAL | Status: AC
  Administered 2020-10-26: 650 mg via ORAL

## 2020-10-26 MED ORDER — SODIUM CHLORIDE 0.9 % IV SOLN
Freq: Once | INTRAVENOUS | Status: AC
  Filled 2020-10-26: qty 250

## 2020-10-26 MED ORDER — ACETAMINOPHEN 325 MG PO TABS
ORAL_TABLET | ORAL | Status: AC
  Filled 2020-10-26: qty 2

## 2020-10-26 MED ORDER — ALTEPLASE 2 MG IJ SOLR
2.0000 mg | Freq: Once | INTRAMUSCULAR | Status: AC | PRN
  Administered 2020-10-26: 2 mg
  Filled 2020-10-26: qty 2

## 2020-10-26 MED ORDER — DIPHENHYDRAMINE HCL 25 MG PO CAPS
ORAL_CAPSULE | ORAL | Status: AC
  Filled 2020-10-26: qty 1

## 2020-10-26 MED ORDER — ALTEPLASE 2 MG IJ SOLR
INTRAMUSCULAR | Status: AC
  Filled 2020-10-26: qty 2

## 2020-10-26 MED ORDER — SODIUM CHLORIDE 0.9% FLUSH
10.0000 mL | INTRAVENOUS | Status: DC | PRN
  Administered 2020-10-26: 10 mL
  Filled 2020-10-26: qty 10

## 2020-10-26 NOTE — Progress Notes (Signed)
Good Faith Estimate given to patient and explained in Spanish via [place] at front desk. Advised this is just an estimate of what the treatment would cost if she did not have the 100% discount. She verbalized understanding.  She has my card for any additional financial questions or concerns.

## 2020-10-26 NOTE — Patient Instructions (Signed)
[facility]  Discharge Instructions: ?Thank you for choosing [facility] to provide your oncology and hematology care.  ? ?If you have a lab appointment with the Cancer Center, please go directly to the Cancer Center and check in at the registration area. ?  ?Wear comfortable clothing and clothing appropriate for easy access to any Portacath or PICC line.  ? ?We strive to give you quality time with your provider. You may need to reschedule your appointment if you arrive late (15 or more minutes).  Arriving late affects you and other patients whose appointments are after yours.  Also, if you miss three or more appointments without notifying the office, you may be dismissed from the clinic at the provider?s discretion.    ?  ?For prescription refill requests, have your pharmacy contact our office and allow 72 hours for refills to be completed.   ? ?Today you received the following chemotherapy and/or immunotherapy agents: Trastuzumab    ?  ?To help prevent nausea and vomiting after your treatment, we encourage you to take your nausea medication as directed. ? ?BELOW ARE SYMPTOMS THAT SHOULD BE REPORTED IMMEDIATELY: ?*FEVER GREATER THAN 100.4 F (38 ?C) OR HIGHER ?*CHILLS OR SWEATING ?*NAUSEA AND VOMITING THAT IS NOT CONTROLLED WITH YOUR NAUSEA MEDICATION ?*UNUSUAL SHORTNESS OF BREATH ?*UNUSUAL BRUISING OR BLEEDING ?*URINARY PROBLEMS (pain or burning when urinating, or frequent urination) ?*BOWEL PROBLEMS (unusual diarrhea, constipation, pain near the anus) ?TENDERNESS IN MOUTH AND THROAT WITH OR WITHOUT PRESENCE OF ULCERS (sore throat, sores in mouth, or a toothache) ?UNUSUAL RASH, SWELLING OR PAIN  ?UNUSUAL VAGINAL DISCHARGE OR ITCHING  ? ?Items with * indicate a potential emergency and should be followed up as soon as possible or go to the Emergency Department if any problems should occur. ? ?Please show the CHEMOTHERAPY ALERT CARD or IMMUNOTHERAPY ALERT CARD at check-in  to the Emergency Department and triage nurse. ? ?Should you have questions after your visit or need to cancel or reschedule your appointment, please contact [facility]  Dept: [phone]  and follow the prompts.  Office hours are 8:00 a.m. to 4:30 p.m. Monday - Friday. Please note that voicemails left after 4:00 p.m. may not be returned until the following business day.  We are closed weekends and major holidays. You have access to a nurse at all times for urgent questions. Please call the main number to the clinic Dept: [phone] and follow the prompts. ? ? ?For any non-urgent questions, you may also contact your provider using MyChart. We now offer e-Visits for anyone 18 and older to request care online for non-urgent symptoms. For details visit [email or web address]. ?  ?Also download the MyChart app! Go to the app store, search "MyChart", open the app, select [facility], and log in with your MyChart username and password. ? ?Due to Covid, a mask is required upon entering the hospital/clinic. If you do not have a mask, one will be given to you upon arrival. For doctor visits, patients may have 1 support person aged 18 or older with them. For treatment visits, patients cannot have anyone with them due to current Covid guidelines and our immunocompromised population.  ? ?

## 2020-11-01 ENCOUNTER — Other Ambulatory Visit: Payer: Self-pay

## 2020-11-01 ENCOUNTER — Ambulatory Visit: Payer: Self-pay | Attending: Family Medicine | Admitting: Physical Therapy

## 2020-11-01 DIAGNOSIS — G8929 Other chronic pain: Secondary | ICD-10-CM | POA: Insufficient documentation

## 2020-11-01 DIAGNOSIS — M5441 Lumbago with sciatica, right side: Secondary | ICD-10-CM | POA: Insufficient documentation

## 2020-11-01 DIAGNOSIS — M5442 Lumbago with sciatica, left side: Secondary | ICD-10-CM | POA: Insufficient documentation

## 2020-11-01 DIAGNOSIS — M533 Sacrococcygeal disorders, not elsewhere classified: Secondary | ICD-10-CM | POA: Insufficient documentation

## 2020-11-01 NOTE — Patient Instructions (Signed)
Access Code: [email or web address] URL: [email or web address] Date: 11/01/2020 Prepared by: Raeford Razor  Exercises Supine Lower Trunk Rotation - 2 x daily - 7 x weekly - 2 sets - 10 reps - 10 hold Supine Posterior Pelvic Tilt - 2 x daily - 7 x weekly - 2 sets - 10 reps - 5 hold Supine Bridge - 2 x daily - 7 x weekly - 2 sets - 10 reps - 5 hold Supine Piriformis Stretch with Foot on Ground - 2 x daily - 7 x weekly - 1 sets - 5 reps - 30 hold Child's Pose Stretch - 2 x daily - 7 x weekly - 1 sets - 5 reps - 30 hold

## 2020-11-01 NOTE — Progress Notes (Signed)
I, Jill [name], LAT, ATC acting as a scribe for Jill Leader, MD.  Jill [name] Adjutant is a 45 y.o. female who presents to [place] at [facility] today for f/u low back and sacral/coccyx pain. Pt was last seen by Dr. Georgina Snell on 10/03/20 and was given bilat SIJ steroid injections and was advised use a cushion. Pt was also referred to PT of which she's completed 1 visit. Today, pt reports has gotten a little better the injection helped a lot, but still feeling pain. PT did give exercises to take home but since the visit was yesterday is unable to tell if that is making a difference.    Dx imaging: 09/24/20 L-spine & sacrum/SIJ MRI  Pertinent review of systems: No fevers or chills  Relevant historical information: Breast cancer   Exam:  BP 110/70 (BP Location: Left Arm, Patient Position: Sitting, Cuff Size: Normal)   Pulse 84   Ht 4\' 10"  (1.473 m)   Wt 166 lb (75.3 kg)   SpO2 98%   BMI 34.69 kg/m  General: Well Developed, well nourished, and in no acute distress.   MSK: L-spine normal-appearing-nontender midline. Tender to palpation lumbar paraspinal musculature and SI joint mildly.    Lab and Radiology Results  EXAM: MRI LUMBAR SPINE WITHOUT AND WITH CONTRAST  TECHNIQUE: Multiplanar and multiecho pulse sequences of the lumbar spine were obtained without and with intravenous contrast.  CONTRAST: 35mL GADAVIST GADOBUTROL 1 MMOL/ML IV SOLN  COMPARISON: CT abdomen and pelvis from September 06, 2020.  FINDINGS: Segmentation: Standard segmentation is assumed. The inferior-most fully formed intervertebral disc is labeled L5-S1. In  Alignment: Grade 1 anterolisthesis of L5 on S1.  Vertebrae: Vertebral body heights are maintained. No specific evidence of acute fracture, discitis/osteomyelitis, or suspicious bone lesion. No abnormal enhancing lesion identified. Bilateral L5 pars defects, confirmed on prior CT abdomen and pelvis.  Conus  medullaris and cauda equina: Conus extends to the superior L2 level. Conus appears normal. No evidence of abnormal enhancement of the conus or cauda quinine  Paraspinal and other soft tissues: Mild perifacet inflammatory/degenerative arthropathy at L5-S1. Otherwise, unremarkable.  Disc levels:  T11-T12: Only imaged sagittally without significant canal or foraminal stenosis.  T12-L1: No significant disc protrusion, foraminal stenosis, or canal stenosis.  L1-L2: No significant disc protrusion, foraminal stenosis, or canal stenosis.  L2-L3: No significant disc protrusion, foraminal stenosis, or canal stenosis.  L3-L4: No significant disc protrusion, foraminal stenosis, or canal stenosis.  L4-L5: No significant disc protrusion, foraminal stenosis, or canal stenosis.  L5-S1: Bilateral pars defects. Grade 1 anterolisthesis of L5 on S1. Disc desiccation. Bilateral facet hypertrophy/arthropathy. Mildly prominent dorsal epidural. Uncovering of the disc with superimposed broad disc bulge and small left foraminal annular fissure. Resulting mild bilateral foraminal stenosis. The canal is mildly narrowed in transverse dimension.  IMPRESSION: 1. No evidence of osseous metastatic disease in the lumbar spine. 2. 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 Resulting degenerative disc disease and facet arthropathy at this level with mild canal and bilateral foraminal stenosis.   Electronically Signed By: Margaretha Sheffield MD On: 09/26/2020 10:26  EXAM: MRI SACRUM WITH AND WITHOUT CONTRAST  TECHNIQUE: Multiplanar multi-sequence MR imaging of the sacrum was performed before and after IV contrast administration.  CONTRAST: CONTRAST 7 cc Gadavist  COMPARISON: CT pelvis 09/06/2020  FINDINGS: Urinary Tract: Unremarkable  Bowel: Unremarkable  Vascular/Lymphatic: Small bilateral inguinal lymph nodes are not pathologically  enlarged.  Reproductive: T-shaped IUD satisfactorily  positioned along the endometrium. Right Bartholin cyst noted.  Other: No supplemental non-categorized findings.  Musculoskeletal: Symmetric reduced T1 and T2 signal along both sacroiliac joints reflecting the sclerosis seen on CT, without significant abnormal marrow edema or well-defined erosions. No effusion of the SI joints noted. Typical enhancement of the synovium observed.  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  There is no impinging lesion along the sacral plexus or visualized proximal sciatic nerves. No abnormal enhancement or edema signal in the sciatic nerves.  No findings of regional pelvic metastatic disease.  IMPRESSION: 1. Chronic bilateral sacroiliitis, with sclerosis along the SI joints but no current edema, erosion, or effusion. 2. No findings of metastatic disease to the visualized pelvis. 3. Incidental IUD along the endometrium. Incidental Bartholin cyst. 4. 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   Electronically Signed By: Van Clines M.D. On: 09/26/2020 19:34  I, Jill [name], personally (independently) visualized and performed the interpretation of the images attached in this note.     Assessment and Plan: 45 y.o. female with low back pain/SI joint pain.  Patient had great response to bilateral SI joint injections in clinic last month.  She still has great pain response.  Hopeful that this shot will last a long time and will need repeated anytime soon.  Can do this shot every 3 months and if they do not last very long next step would be referral to pain management for SI joint nerve ablation.  Additionally the other source of low back pain are the pars defects.  She just had her first session with physical therapy yesterday so it is too early to tell hello this is going to work but core strengthening is the foundation of treatment for back pain from pars defects.   Plan for course of PT and recheck in 6 weeks.   Visit conducted today using Spanish interpreter.  Discussed warning signs or symptoms. Please see discharge instructions. Patient expresses understanding.   The above documentation has been reviewed and is accurate and complete Jill [name], M.D.

## 2020-11-01 NOTE — Therapy (Signed)
[facility] [place], [place], [address] Phone: [phone]   Fax:  [phone]  Physical Therapy Evaluation  Patient Details  Name: Jill [name] MRN: [id number] Date of Birth: [birth year] Referring Provider (PT): Dr. [name]   Encounter Date: 11/01/2020   PT End of Session - 11/01/20 1634    Visit Number 1    Number of Visits 8    Date for PT Re-Evaluation 12/27/20    Authorization Type self pay, CAFA    Authorization Time Period 10/22    PT Start Time 1136    PT Stop Time 1217    PT Time Calculation (min) 41 min    Activity Tolerance Patient tolerated treatment well    Behavior During Therapy [facility] for tasks assessed/performed           Past Medical History:  Diagnosis Date  . Breast cancer ([place]) 03/07/2020  . Infected cyst of Bartholin's gland duct   . Medical history non-contributory     Past Surgical History:  Procedure Laterality Date  . IR IMAGING GUIDED PORT INSERTION  03/02/2020  . MASTECTOMY W/ SENTINEL NODE BIOPSY Right 03/07/2020   Procedure: RIGHT MASTECTOMY WITH SENTINEL LYMPH NODE BIOPSY;  Surgeon: Jovita Kussmaul, MD;  Location: [facility];  Service: General;  Laterality: Right;  . NO PAST SURGERIES    . WISDOM TOOTH EXTRACTION      There were no vitals filed for this visit.    Subjective Assessment - 11/01/20 1141    Subjective Pt began to have low back pain/tailbone pain when she was 6 mos pregnant.  That was June 2020.  There was some time when the pain resolved.  She thinks her pain may be related to the chemotherapy. she has pain especially when sitting too long or standing too long. It feels like something is pressing or inflamed.  She denies weakness, sensory changes.  She is no longer working as much as she used to since her cancer diagnosis.  She is done with her chemo treatment.    Pertinent History Breast CA with chemo, mastecomy    Limitations  Sitting;House hold activities;Lifting;Standing;Walking    How long can you sit comfortably? < 10 min    Diagnostic tests Grade 1 anterolisthesis of L5 on S1.Disc desiccation. Bilateral facet hypertrophy/arthropathy.Chronic bilateral sacroiliitis    Patient Stated Goals Patient would like to get exercises to help her feel better.    Currently in Pain? Yes    Pain Score 6     Pain Location Back    Pain Orientation Lower    Pain Descriptors / Indicators Other (Comment)   small area in the bone   Pain Type Chronic pain    Pain Onset More than a month ago    Pain Frequency Intermittent    Aggravating Factors  sitting on her tailbone    Pain Relieving Factors changing positions    Effect of Pain on Daily Activities limits her work and abiltiy to sit, hold baby              [facility] PT Assessment - 11/01/20 0001      Assessment   Medical Diagnosis low back pain , coccyx pain    Referring Provider (PT) Dr. [name]    Onset Date/Surgical Date --   June 2020   Prior Therapy No      Precautions   Precaution Comments Cancer      Restrictions  Weight Bearing Restrictions No      Balance Screen   Has the patient fallen in the past 6 months No      [place] residence      Prior Function   Level of Independence Independent    Vocation Part time employment    Teacher, adult education    Leisure limited      Cognition   Overall Cognitive Status Within Functional Limits for tasks assessed      Observation/Other Assessments   Focus on Therapeutic Outcomes (FOTO)  NT      Sensation   Light Touch Appears Intact      Coordination   Gross Motor Movements are Fluid and Coordinated Not tested      Squat   Comments painful      Posture/Postural Control   Posture/Postural Control Postural limitations    Postural Limitations Rounded Shoulders;Forward head;Increased thoracic kyphosis      AROM   Overall AROM Comments trunk WFL       Strength   Overall Strength Comments knees 5/5    Right Hip Flexion 4/5    Right Hip Extension 4/5    Right Hip ABduction 4/5    Left Hip Flexion 4/5    Left Hip Extension 4/5    Left Hip ABduction 4/5      Palpation   Spinal mobility NT    Palpation comment pain central lumbar and lateral SI border bilaterally. Pain in sacrum and coccyx      Special Tests    Special Tests Lumbar    Other special tests PROM of hips felt good to patient      Straight Leg Raise   Findings Positive    Comment contralateral pain in back with SLR              Objective measurements completed on examination: See above findings.       PT Short Term Goals - 11/01/20 1638      PT SHORT TERM GOAL #1   Title Pt will be I with HEP for low back pain    Time 4    Period Weeks    Status New    Target Date 11/29/20      PT SHORT TERM GOAL #2   Title Pt will be able to acquire seat cushion for more comfort at rest, seated.    Time 4    Period Weeks    Status New    Target Date 11/29/20             PT Long Term Goals - 11/01/20 1639      PT LONG TERM GOAL #1   Title Pt will be able to report ability to sit for transportation with min pain overall to and from therapy.    Time 8    Period Weeks    Status New    Target Date 12/27/20      PT LONG TERM GOAL #2   Title Pt will be I with HEP for core and hips, trunk flexibility    Time 8    Period Weeks    Status New    Target Date 12/27/20      PT LONG TERM GOAL #3   Title Pt will be able to properly lift and squat without increase in pain for work and childcare duties    Time 8    Period Weeks    Status New  Target Date 12/27/20      PT LONG TERM GOAL #4   Title Pt will be able to increase hip strength to 5/5 in al planes in order to support posture and balance    Time 8    Period Weeks    Status New    Target Date 12/27/20                  Plan - 11/01/20 1638    Clinical Impression Statement Patient presents  for mod complexity eval for low back and coccyx pain which has become chronic and more severe over the past 6 mos.  Her pain is consistent with MRI findings.  Took time to explain the anatomy of her lumbosacral spine and coccyx.  SHe has some hip and core weakness.  She may have lost strength as she was undergoing treatment for breast cancer and now she has increased pain. SHe will benefit from skilled PT to improve hip mobility and overall functional strength to allow her to continue working and be more comfortable at rest, and performing childcare .    Personal Factors and Comorbidities Comorbidity 1;Time since onset of injury/illness/exacerbation;Transportation    Comorbidities cancer    Examination-Activity Limitations Squat;Bed Mobility;Lift;Stairs;Bend;Locomotion Level;Stand;Toileting;Transfers;Carry;Sit    Examination-Participation Restrictions Community Activity;Occupation;Interpersonal Relationship;Cleaning;Laundry    Stability/Clinical Decision Making Evolving/Moderate complexity    Clinical Decision Making Moderate    Rehab Potential Good    PT Frequency 1x / week    PT Duration 8 weeks    PT Treatment/Interventions ADLs/Self Care Home Management;Therapeutic activities;Manual techniques;Patient/family education;Therapeutic exercise;Moist Heat;Cryotherapy;Functional mobility training;Passive range of motion    PT Next Visit Plan cont assessing hip and trunk, check HEP , lumbopelvic stability    PT Home Exercise Plan [email or web address]    Recommended Other Services cancer rehab, consider pelvic floor    Consulted and Agree with Plan of Care Patient           Patient will benefit from skilled therapeutic intervention in order to improve the following deficits and impairments:  Decreased mobility,Hypomobility,Decreased range of motion,Decreased strength,Increased fascial restricitons,Impaired flexibility,Postural dysfunction,Pain  Visit Diagnosis: Coccyx pain  Chronic midline low back pain with  bilateral sciatica     Problem List Patient Active Problem List   Diagnosis Date Noted  . Port-A-Cath in place 05/11/2020  . Genetic testing 03/04/2020  . Malignant neoplasm of upper-outer quadrant of right breast in female, estrogen receptor positive ([place]) 02/10/2020  . [redacted] weeks gestation of pregnancy 03/16/2019  . Normal labor 03/15/2019    Renel Ende 11/01/2020, 4:49 PM  [facility] [address] [place], [place], [address] Phone: [phone]   Fax:  [phone]  Name: Anett Ranker MRN: [id number] Date of Birth: 11/10/1975   Raeford Razor, PT 11/01/20 4:50 PM Phone: [phone] Fax: [phone]

## 2020-11-02 ENCOUNTER — Ambulatory Visit (INDEPENDENT_AMBULATORY_CARE_PROVIDER_SITE_OTHER): Payer: Self-pay | Admitting: Family Medicine

## 2020-11-02 ENCOUNTER — Encounter: Payer: Self-pay | Admitting: Family Medicine

## 2020-11-02 VITALS — BP 110/70 | HR 84 | Ht <= 58 in | Wt 166.0 lb

## 2020-11-02 DIAGNOSIS — M4306 Spondylolysis, lumbar region: Secondary | ICD-10-CM

## 2020-11-02 DIAGNOSIS — M533 Sacrococcygeal disorders, not elsewhere classified: Secondary | ICD-10-CM

## 2020-11-02 NOTE — Patient Instructions (Signed)
Thank you for coming in today.  Return in 6 weeks.   Schedule interpreter please.

## 2020-11-08 ENCOUNTER — Other Ambulatory Visit: Payer: Self-pay

## 2020-11-08 ENCOUNTER — Ambulatory Visit: Payer: Self-pay | Admitting: Physical Therapy

## 2020-11-08 ENCOUNTER — Encounter: Payer: Self-pay | Admitting: Physical Therapy

## 2020-11-08 DIAGNOSIS — M533 Sacrococcygeal disorders, not elsewhere classified: Secondary | ICD-10-CM

## 2020-11-08 DIAGNOSIS — M5442 Lumbago with sciatica, left side: Secondary | ICD-10-CM

## 2020-11-08 DIAGNOSIS — G8929 Other chronic pain: Secondary | ICD-10-CM

## 2020-11-08 NOTE — Therapy (Signed)
[place], [place], [address] Phone: [phone]   Fax:  [phone]  Physical Therapy Treatment  Patient Details  Name: Jill [name] MRN: [id number] Date of Birth: January 25, 1976 Referring Provider (PT): Dr. [name]   Encounter Date: 11/08/2020   PT End of Session - 11/08/20 1325    Visit Number 2    Number of Visits 8    Date for PT Re-Evaluation 12/27/20    Authorization Type self pay, CAFA    PT Start Time 1133    PT Stop Time 1214    PT Time Calculation (min) 41 min    Activity Tolerance Patient tolerated treatment well    Behavior During Therapy [facility] for tasks assessed/performed           Past Medical History:  Diagnosis Date  . Breast cancer ([place]) 03/07/2020  . Infected cyst of Bartholin's gland duct   . Medical history non-contributory     Past Surgical History:  Procedure Laterality Date  . IR IMAGING GUIDED PORT INSERTION  03/02/2020  . MASTECTOMY W/ SENTINEL NODE BIOPSY Right 03/07/2020   Procedure: RIGHT MASTECTOMY WITH SENTINEL LYMPH NODE BIOPSY;  Surgeon: Jovita Kussmaul, MD;  Location: [place];  Service: General;  Laterality: Right;  . NO PAST SURGERIES    . WISDOM TOOTH EXTRACTION      There were no vitals filed for this visit.   Subjective Assessment - 11/08/20 1319    Subjective Patient reports moderate compliance with HEP, had trouble understanding some of them.    Patient is accompained by: Interpreter   Video with Xochitl [phone]   Pertinent History Breast CA with chemo, mastecomy    Limitations Sitting;House hold activities;Lifting;Standing;Walking    Currently in Pain? No/denies   No pain at rest, Max=6/10   Pain Location Back    Pain Orientation Upper;Lower              [facility] PT Assessment - 11/08/20 0001      Assessment   Medical Diagnosis low back pain , coccyx pain    Referring Provider (PT) Dr. [name]                          [facility] Adult PT Treatment/Exercise - 11/08/20 0001      Transfers   Five time sit to stand comments  Taps to chair 2x10      Lumbar Exercises: Stretches   Hip Flexor Stretch 2 reps;30 seconds    Hip Flexor Stretch Limitations Thomas test stretch    Piriformis Stretch Limitations Seated R/L x30sec ea      Lumbar Exercises: Standing   Heel Raises 20 reps      Lumbar Exercises: Supine   Pelvic Tilt 10 reps    Bridge 10 reps    Other Supine Lumbar Exercises Lower trunk rotation x10 ea dir      Lumbar Exercises: Sidelying   Clam 10 reps;Both    Hip Abduction 10 reps;Both      Manual Therapy   Manual Therapy Soft tissue mobilization    Manual therapy comments B UTs/levators followed by self stretch                  PT Education - 11/08/20 1323    Education Details Reviewed initial HEP with verbal cues, addition of Thomas stretch and replaced supine piriformis stretch with seated. Public house manager)    Methods  Explanation;Demonstration;Handout;Verbal cues    Comprehension Verbalized understanding;Returned demonstration;Verbal cues required            PT Short Term Goals - 11/01/20 1638      PT SHORT TERM GOAL #1   Title Pt will be I with HEP for low back pain    Time 4    Period Weeks    Status New    Target Date 11/29/20      PT SHORT TERM GOAL #2   Title Pt will be able to acquire seat cushion for more comfort at rest, seated.    Time 4    Period Weeks    Status New    Target Date 11/29/20             PT Long Term Goals - 11/01/20 1639      PT LONG TERM GOAL #1   Title Pt will be able to report ability to sit for transportation with min pain overall to and from therapy.    Time 8    Period Weeks    Status New    Target Date 12/27/20      PT LONG TERM GOAL #2   Title Pt will be I with HEP for core and hips, trunk flexibility    Time 8    Period Weeks    Status New    Target Date 12/27/20      PT LONG TERM GOAL #3    Title Pt will be able to properly lift and squat without increase in pain for work and childcare duties    Time 8    Period Weeks    Status New    Target Date 12/27/20      PT LONG TERM GOAL #4   Title Pt will be able to increase hip strength to 5/5 in al planes in order to support posture and balance    Time 8    Period Weeks    Status New    Target Date 12/27/20                 Plan - 11/08/20 1326    Clinical Impression Statement Patient presented to PT eval without low back coccyx pain, continues to c/o 6/10 upper and lower back pain at times.  Patient required review of HEP for clerification.  Patient responded well to [facility] and stretch for UT/Levators.  Patient able to perform exercises without increased pain today, decreased upper back stiffness.  Patient will benefit from continued skilled PT to address deficits and maximize home chores and employment without incrased pain.    Personal Factors and Comorbidities Comorbidity 1;Time since onset of injury/illness/exacerbation;Transportation    Examination-Activity Limitations Squat;Bed Mobility;Lift;Stairs;Bend;Locomotion Level;Stand;Toileting;Transfers;Carry;Sit    Examination-Participation Restrictions Community Activity;Occupation;Interpersonal Relationship;Cleaning;Laundry    PT Treatment/Interventions ADLs/Self Care Home Management;Therapeutic activities;Manual techniques;Patient/family education;Therapeutic exercise;Moist Heat;Cryotherapy;Functional mobility training;Passive range of motion    PT Next Visit Plan cont assessing hip and trunk, check HEP, lumbopelvic stability and generalized core stability.    PT Home Exercise Plan [id number]    Consulted and Agree with Plan of Care Patient           Patient will benefit from skilled therapeutic intervention in order to improve the following deficits and impairments:  Decreased mobility,Hypomobility,Decreased range of motion,Decreased strength,Increased fascial  restricitons,Impaired flexibility,Postural dysfunction,Pain  Visit Diagnosis: Coccyx pain  Chronic midline low back pain with bilateral sciatica     Problem List Patient Active Problem List   Diagnosis Date Noted  .  Port-A-Cath in place 05/11/2020  . Genetic testing 03/04/2020  . Malignant neoplasm of upper-outer quadrant of right breast in female, estrogen receptor positive ([place]) 02/10/2020  . [redacted] weeks gestation of pregnancy 03/16/2019  . Normal labor 03/15/2019    Pollyann Samples, PT 11/08/2020, 1:33 PM  [facility] [address] [place], [place], [address] Phone: [phone]   Fax:  [phone]  Name: Jill [name] MRN: [id number] Date of Birth: [birth year]

## 2020-11-15 NOTE — Progress Notes (Signed)
[place]  Telephone:([phone] Fax:(336) [phone]     ID: Jill [name] DOB: [birth year]  MR#: [id number]  JAS#:[id number]  Patient Care Team: Gildardo Pounds, NP as PCP - General (Nurse Practitioner) Sung Renton, Virgie Dad, MD as Consulting Physician (Oncology) Jovita Kussmaul, MD as Consulting Physician (General Surgery) Dillingham, Loel Lofty, DO as Attending Physician (Plastic Surgery) Mauro Kaufmann, RN as Oncology Nurse Navigator Rockwell Germany, RN as Oncology Nurse Navigator Chauncey Cruel, MD OTHER MD:  CHIEF COMPLAINT: triple positive breast cancer (s/p right mastectomy)  CURRENT TREATMENT: trastuzumab   INTERVAL HISTORY: Jill [name] returns today for follow up of her triple positive breast cancer.    She completed radiation therapy on 09/30/2020.  She tolerated that well.  Her skin is beginning to return to normal color.  She continues on trastuzumab.  She has absolutely no side effects from these treatments.  Since her last visit, she underwent lumbar spine and sacrum MRI on 09/24/2020. Both scans were negative for metastatic disease.  She had to SI joint injection in April as well as some rehab and that has made an enormous difference to her.  She is essentially pain-free at present  She also underwent repeat echocardiogram on 09/28/2020 showing an ejection fraction of 55-60%.   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  She is also busy with her family and she is continuing to rehab her hips.  She is tolerating tamoxifen well with no unusual side effects.  She requested a physical therapy referral for right upper extremity range of motion.  A detailed review of systems today was otherwise benign  COVID 19 VACCINATION STATUS: fully vaccinated [employer]), no booster as of December 2021   HISTORY OF CURRENT ILLNESS: From the original intake note:  "Jill [name]" herself palpated a right breast abnormality sometime in  2019.,  She did not pay much mind.  This was noted again during her pregnancy and she underwent bilateral diagnostic mammography with tomography and right breast ultrasonography at [facility] on 01/21/2020 showing: breast density category C; 4.1 cm irregular mass with associated group of pleomorphic calcifications measuring approximately 4.9 cm, located at site of palpable concern in upper-outer right breast at 9:30; no right axillary lymphadenopathy.  Accordingly on 02/04/2020 she proceeded to biopsy of the right breast area in question. The pathology from this procedure ([id number]) showed: invasive and in situ mammary carcinoma, e-cadherin positive, grade 2. Prognostic indicators significant for: estrogen receptor, 100% positive and progesterone receptor, 100% positive, both with strong staining intensity. Proliferation marker Ki67 at 20%. HER2 equivocal by immunohistochemistry (2+), but positive by fluorescent in situ hybridization with a signals ratio 5.39 and number per cell 11.05.  The patient's subsequent history is as detailed below.   PAST MEDICAL HISTORY: Past Medical History:  Diagnosis Date  . Breast cancer ([place]) 03/07/2020  . Infected cyst of Bartholin's gland duct   . Medical history non-contributory     PAST SURGICAL HISTORY: Past Surgical History:  Procedure Laterality Date  . IR IMAGING GUIDED PORT INSERTION  03/02/2020  . MASTECTOMY W/ SENTINEL NODE BIOPSY Right 03/07/2020   Procedure: RIGHT MASTECTOMY WITH SENTINEL LYMPH NODE BIOPSY;  Surgeon: Jovita Kussmaul, MD;  Location: [place];  Service: General;  Laterality: Right;  . NO PAST SURGERIES    . WISDOM TOOTH EXTRACTION      FAMILY HISTORY: Family History  Problem Relation Age of Onset  . Diabetes Maternal  Grandmother   . Hypertension Mother   The patient's father is 33 and her mother 66 as of August 2021.  The patient is 3 brothers and 2 sisters.  There is no history of cancer in the family to  her knowledge   GYNECOLOGIC HISTORY:  No LMP recorded. (Menstrual status: IUD). Menarche: 45 years old Age at first live birth: 45 years old [place] P 4 LMP irregular Contraceptive: Mirena IUD in place HRT n/a  Hysterectomy? no BSO? 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  Her significant other [name] works Occupational hygienist.  They are both from [place].  The patient's children are ages 32, 23, 34, and 28 months as of August 2021.  The older 3 children are from a different father and their last name is [name].  (The patient was not married to Mr. [name] so there is no legal issue regarding access to medical records, etc.). The 19-monthold is a child of Jill [name] and [place]  The patient attends a local EPortisDIRECTIVES: Not in place   HEALTH MAINTENANCE: Social History   Tobacco Use  . Smoking status: Never Smoker  . Smokeless tobacco: Never Used  Vaping Use  . Vaping Use: Never used  Substance Use Topics  . Alcohol use: Never  . Drug use: Never     Colonoscopy: n/a (age)  PAP: 10/2018, negative  Bone density: n/a (age)   No Known Allergies  Current Outpatient Medications  Medication Sig Dispense Refill  . calcium carbonate (TUMS - DOSED IN MG ELEMENTAL CALCIUM) 500 MG chewable tablet CHEW 1 TABLET BY MOUTH THREE TIMES DAILY AS NEEDED FOR INDIGESTION OR HEARTBURN (Patient not taking: No sig reported) 150 tablet 0  . dexamethasone (DECADRON) 4 MG tablet TAKE 2 TABLETS BY MOUTH 2 TIMES DAILY THE DAY BEFORE TAXOTERE THEN DAILY FOR 3 DAYS AFTER CARBOPLATIN CHEMO (Patient not taking: No sig reported) 30 tablet 1  . dexamethasone (DECADRON) 4 MG tablet TAKE 2 TABLETS BY MOUTH 2 TIMES DAILY. TAKE DEXAMETHASONE TWICE DAILY THE DAY BEFORE TAXOTERE THEN DAILY X 3 DAYS AFTER CARBOPLATIN CHEMO (Patient not taking: No sig reported) 30 tablet  1  . dexamethasone (DECADRON) 4 MG tablet TAKE 2 TABLETS BY MOUTH 2 TIMES DAILY THE DAY BEFORE TREATMENT, NONE THE DAY OF, THEN 2 TABLETS EVERY MORNING FOR 3 DAYS FOLLOWING TREATMENT. (Patient not taking: No sig reported) 30 tablet 1  . HYDROcodone-acetaminophen (NORCO/VICODIN) 5-325 MG tablet TAKE 1 TO 2 TABLETS BY MOUTH EVERY 6 HOURS AS NEEDED FOR MODERATE OR SEVERE PAIN (Patient not taking: No sig reported) 15 tablet 0  . HYDROcodone-acetaminophen (NORCO/VICODIN) 5-325 MG tablet TAKE 1 TO 2 TABLETS BY MOUTH EVERY 6 HOURS AS NEEDED FOR MODERATE TO SEVERE PAIN (Patient not taking: No sig reported) 15 tablet 0  . ibuprofen (ADVIL) 200 MG tablet Take 400 mg by mouth every 6 (six) hours as needed for headache or moderate pain.    .Marland Kitchenlidocaine-prilocaine (EMLA) cream APPLY TO AFFECTED AREA ONCE AS DIRECTED (Patient not taking: No sig reported) 30 g 3  . methocarbamol (ROBAXIN) 750 MG tablet TAKE 1 TABLET BY MOUTH 4 TIMES DAILY AS NEEDED (USE FOR MUSCLE CRAMPS/PAIN). (Patient not taking: No sig reported) 30 tablet 2  . methocarbamol (ROBAXIN) 750 MG tablet TAKE 1 TABLET (750 MG TOTAL) BY MOUTH 4 (FOUR) TIMES DAILY AS NEEDED (  USE FOR MUSCLE CRAMPS/PAIN). (Patient not taking: No sig reported) 30 tablet 2  . omeprazole (PRILOSEC) 40 MG capsule TAKE 1 CAPSULE BY MOUTH DAILY. START THE DAY BEFORE CHEMO AND CONTINUE FOR 5 DAYS. (Patient not taking: No sig reported) 30 capsule 0  . omeprazole (PRILOSEC) 40 MG capsule TAKE 1 CAPSULE BY MOUTH DAILY START DAY BEFORE CHEMO AND CONTINUE FOR 5 DAYS AS DIRECTED (Patient not taking: No sig reported) 30 capsule 0  . prochlorperazine (COMPAZINE) 10 MG tablet TAKE 1 TABLET BY MOUTH EVERY 6 HOURS AS NEEDED (NAUSEA OR VOMITING). (Patient not taking: No sig reported) 30 tablet 1  . prochlorperazine (COMPAZINE) 10 MG tablet TAKE 1 TABLET BY MOUTH EVERY 6 HOURS AS NEEDED FOR NAUSEA OR VOMITING. (Patient not taking: No sig reported) 30 tablet 1  . prochlorperazine (COMPAZINE) 10 MG  tablet TAKE 1 TABLET BY MOUTH EVERY 6 HOURS AS NEEDED (NAUSEA OR VOMITING). (Patient not taking: No sig reported) 30 tablet 1  . valACYclovir (VALTREX) 1000 MG tablet TAKE 1 TABLET (1,000 MG TOTAL) BY MOUTH DAILY. TAKE DAYS 1 THROUGH 7 OF EACH CHEMOTHERAPY CYCLE (Patient not taking: No sig reported) 30 tablet 0   No current facility-administered medications for this visit.    OBJECTIVE: Spanish speaker who appears stated age  14:   11/16/20 1313  BP: (!) 102/53  Pulse: 81  Resp: 20  Temp: 97.7 F (36.5 C)  SpO2: 100%     Body mass index is 34.36 kg/m.   Wt Readings from Last 3 Encounters:  11/16/20 164 lb 6.4 oz (74.6 kg)  11/02/20 166 lb (75.3 kg)  10/26/20 166 lb 8 oz (75.5 kg)      ECOG FS:1 - Symptomatic but completely ambulatory  Sclerae unicteric, EOMs intact Wearing a mask No cervical or supraclavicular adenopathy Lungs no rales or rhonchi Heart regular rate and rhythm Abd soft, nontender, positive bowel sounds MSK no focal spinal tenderness, no upper extremity lymphedema Neuro: nonfocal, well oriented, appropriate affect Breasts: The right breast is status post mastectomy and radiation.  There is mild hyperpigmentation.  There is no residual or recurrent disease.  Left breast and both axillae are benign.   LAB RESULTS:  CMP     Component Value Date/Time   NA 142 10/26/2020 0928   NA 138 12/15/2019 1543   K 4.2 10/26/2020 0928   CL 105 10/26/2020 0928   CO2 29 10/26/2020 0928   GLUCOSE 87 10/26/2020 0928   BUN 12 10/26/2020 0928   BUN 10 12/15/2019 1543   CREATININE 0.72 10/26/2020 0928   CREATININE 0.77 02/22/2020 1353   CALCIUM 9.2 10/26/2020 0928   PROT 6.9 10/26/2020 0928   PROT 7.3 12/15/2019 1543   ALBUMIN 3.9 10/26/2020 0928   ALBUMIN 4.5 12/15/2019 1543   AST 26 10/26/2020 0928   AST 13 (L) 02/22/2020 1353   ALT 29 10/26/2020 0928   ALT 8 02/22/2020 1353   ALKPHOS 84 10/26/2020 0928   BILITOT 0.5 10/26/2020 0928   BILITOT 0.3  02/22/2020 1353   GFRNONAA >60 10/26/2020 0928   GFRNONAA >60 02/22/2020 1353   GFRAA >60 03/28/2020 0813   GFRAA >60 02/22/2020 1353    No results found for: TOTALPROTELP, ALBUMINELP, A1GS, A2GS, BETS, BETA2SER, GAMS, MSPIKE, SPEI  Lab Results  Component Value Date   WBC 3.6 (L) 11/16/2020   NEUTROABS 2.4 11/16/2020   HGB 13.4 11/16/2020   HCT 39.8 11/16/2020   MCV 91.5 11/16/2020   PLT 321 11/16/2020  No results found for: LABCA2  No components found for: QIONGE952  No results for input(s): INR in the last 168 hours.  No results found for: LABCA2  No results found for: WUX324  No results found for: MWN027  No results found for: OZD664  No results found for: CA2729  No components found for: HGQUANT  No results found for: CEA1 / No results found for: CEA1   No results found for: AFPTUMOR  No results found for: CHROMOGRNA  No results found for: KPAFRELGTCHN, LAMBDASER, KAPLAMBRATIO (kappa/lambda light chains)  Lab Results  Component Value Date   HGBA 97.0 10/10/2018   (Hemoglobinopathy evaluation)   No results found for: LDH  No results found for: IRON, TIBC, IRONPCTSAT (Iron and TIBC)  No results found for: FERRITIN  Urinalysis    Component Value Date/Time   COLORURINE YELLOW 12/31/2017 2039   APPEARANCEUR CLOUDY (A) 12/31/2017 2039   LABSPEC 1.015 12/31/2017 2039   PHURINE 5.0 12/31/2017 2039   GLUCOSEU NEGATIVE 12/31/2017 2039   HGBUR MODERATE (A) 12/31/2017 2039   BILIRUBINUR NEGATIVE 12/31/2017 2039   Parole NEGATIVE 12/31/2017 2039   PROTEINUR NEGATIVE 12/31/2017 2039   NITRITE NEGATIVE 12/31/2017 2039   LEUKOCYTESUR LARGE (A) 12/31/2017 2039    STUDIES: No results found.   ELIGIBLE FOR AVAILABLE RESEARCH PROTOCOL: AET  ASSESSMENT: 45 y.o. [place] woman status post right breast upper outer quadrant biopsy 02/04/2020 for a clinical T2 N0, stage IB invasive ductal carcinoma, grade 2, triple positive, with an MIB-1 of  20%  (1) status post right mastectomy and sentinel lymph node sampling 03/07/2020 for a pT2 pN1, stage IB invasive ductal carcinoma, grade 2, with negative margins.  (a) a total of 4 lymph nodes were removed, one positive  (2) adjuvant chemo immunotherapy will consist of carboplatin, docetaxel, trastuzumab and pertuzumab every 21 days x 6 starting 03/29/2020, completed 07/18/2020  (a) pertuzumab discontinued after cycle 2 secondary to diarrhea  (3) trastuzumab continuing to complete a year (through October 2022)  (a) echo 03/18/2020 shows an ejection fraction in the 60-65% range  (b) echo 06/29/2020 showed an EF in the 65-70% range  (c) echo 09/28/2020 showed an EF of 55-60%  (4) adjuvant radiation 08/17/2020 through 10/03/2020 Site Technique Total Dose (Gy) Dose per Fx (Gy) Completed Fx Beam Energies  Chest Wall, Right: CW_Rt 3D 50.4/50.4 1.8 28/28 6X, 10X  Chest Wall, Right: CW_Rt_SCLV 3D 50.4/50.4 1.8 28/28 6X, 10X  Chest Wall, Right: CW_Rt_Bst Electron 10/10 2 5/5 6E   (5) genetics testing 03/02/2020 through the [facility] Common Hereditary Cancers Panel. . The Common Hereditary Cancers Panel found no deleterious mutations in APC, ATM, AXIN2, BARD1, BMPR1A, BRCA1, BRCA2, BRIP1, CDH1, CDK4, CDKN2A (p14ARF), CDKN2A (p16INK4a), CHEK2, CTNNA1, DICER1, EPCAM (Deletion/duplication testing only), GREM1 (promoter region deletion/duplication testing only), KIT, MEN1, MLH1, MSH2, MSH3, MSH6, MUTYH, NBN, NF1, NHTL1, PALB2, PDGFRA, PMS2, POLD1, POLE, PTEN, RAD50, RAD51C, RAD51D, RNF43, SDHB, SDHC, SDHD, SMAD4, SMARCA4. STK11, TP53, TSC1, TSC2, and VHL.  The following genes were evaluated for sequence changes only: SDHA and HOXB13 c.251G>A variant only.   (a)  a Variant of uncertain significance (VUS) in DICER1 at c.3380T>G (p.Ile1127Ser) was noted  (6) tamoxifen started 02/12/2020 in anticipation of possible surgical delays, held during chemotherapy, resumed February 2022  PLAN: Jill [name] will soon be a year  out from definitive surgery for her breast cancer with no evidence of disease recurrence.  This is favorable.  She is tolerating tamoxifen well the plan is to continue that a minimum of  5 years, possibly out to 10 years.  She has benefited from injections to her hip and the rehab she is receiving for that.  I have placed a referral for rehab of her right upper extremity.  I also wrote her a prescription for bras and prostheses and explained where she can go to get that filled.  Finally I put her in for a left mammogram to be done in August.  We will see her again in September.  She knows to call for any other issue that may develop before then  Total encounter time 25 minutes.Sarajane Jews C. Maritza Goldsborough, MD 11/16/20 1:28 PM Medical Oncology and Hematology [facility] [place],  [address] Tel. [phone]    Fax. [phone]   I, Wilburn Mylar, am acting as scribe for Dr. Virgie Dad. Fran Mcree.  I, Lurline Del MD, have reviewed the above documentation for accuracy and completeness, and I agree with the above.    *Total Encounter Time as defined by the Centers for Medicare and Medicaid Services includes, in addition to the face-to-face time of a patient visit (documented in the note above) non-face-to-face time: obtaining and reviewing outside history, ordering and reviewing medications, tests or procedures, care coordination (communications with other health care professionals or caregivers) and documentation in the medical record.

## 2020-11-16 ENCOUNTER — Inpatient Hospital Stay (HOSPITAL_BASED_OUTPATIENT_CLINIC_OR_DEPARTMENT_OTHER): Payer: Self-pay | Admitting: Oncology

## 2020-11-16 ENCOUNTER — Other Ambulatory Visit: Payer: Self-pay

## 2020-11-16 ENCOUNTER — Other Ambulatory Visit (HOSPITAL_COMMUNITY): Payer: Self-pay

## 2020-11-16 ENCOUNTER — Other Ambulatory Visit: Payer: No Typology Code available for payment source

## 2020-11-16 ENCOUNTER — Inpatient Hospital Stay: Payer: Self-pay

## 2020-11-16 ENCOUNTER — Telehealth: Payer: Self-pay | Admitting: Oncology

## 2020-11-16 ENCOUNTER — Encounter: Payer: Self-pay | Admitting: Oncology

## 2020-11-16 VITALS — BP 102/53 | HR 81 | Temp 97.7°F | Resp 20 | Ht <= 58 in | Wt 164.4 lb

## 2020-11-16 DIAGNOSIS — Z170 Estrogen receptor positive status [ER+]: Secondary | ICD-10-CM

## 2020-11-16 DIAGNOSIS — C50411 Malignant neoplasm of upper-outer quadrant of right female breast: Secondary | ICD-10-CM

## 2020-11-16 DIAGNOSIS — Z95828 Presence of other vascular implants and grafts: Secondary | ICD-10-CM

## 2020-11-16 LAB — CBC WITH DIFFERENTIAL/PLATELET
Abs Immature Granulocytes: 0 10*3/uL (ref 0.00–0.07)
Basophils Absolute: 0 10*3/uL (ref 0.0–0.1)
Basophils Relative: 1 %
Eosinophils Absolute: 0 10*3/uL (ref 0.0–0.5)
Eosinophils Relative: 1 %
HCT: 39.8 % (ref 36.0–46.0)
Hemoglobin: 13.4 g/dL (ref 12.0–15.0)
Immature Granulocytes: 0 %
Lymphocytes Relative: 24 %
Lymphs Abs: 0.9 10*3/uL (ref 0.7–4.0)
MCH: 30.8 pg (ref 26.0–34.0)
MCHC: 33.7 g/dL (ref 30.0–36.0)
MCV: 91.5 fL (ref 80.0–100.0)
Monocytes Absolute: 0.3 10*3/uL (ref 0.1–1.0)
Monocytes Relative: 8 %
Neutro Abs: 2.4 10*3/uL (ref 1.7–7.7)
Neutrophils Relative %: 66 %
Platelets: 321 10*3/uL (ref 150–400)
RBC: 4.35 MIL/uL (ref 3.87–5.11)
RDW: 13.1 % (ref 11.5–15.5)
WBC: 3.6 10*3/uL — ABNORMAL LOW (ref 4.0–10.5)
nRBC: 0 % (ref 0.0–0.2)

## 2020-11-16 LAB — COMPREHENSIVE METABOLIC PANEL
ALT: 26 U/L (ref 0–44)
AST: 25 U/L (ref 15–41)
Albumin: 3.7 g/dL (ref 3.5–5.0)
Alkaline Phosphatase: 81 U/L (ref 38–126)
Anion gap: 8 (ref 5–15)
BUN: 9 mg/dL (ref 6–20)
CO2: 28 mmol/L (ref 22–32)
Calcium: 9 mg/dL (ref 8.9–10.3)
Chloride: 107 mmol/L (ref 98–111)
Creatinine, Ser: 0.71 mg/dL (ref 0.44–1.00)
GFR, Estimated: >60 mL/min (ref >60)
Glucose, Bld: 109 mg/dL — ABNORMAL HIGH (ref 70–99)
Potassium: 3.7 mmol/L (ref 3.5–5.1)
Sodium: 143 mmol/L (ref 135–145)
Total Bilirubin: 0.4 mg/dL (ref 0.3–1.2)
Total Protein: 6.5 g/dL (ref 6.5–8.1)

## 2020-11-16 MED ORDER — DIPHENHYDRAMINE HCL 25 MG PO CAPS
25.0000 mg | ORAL_CAPSULE | Freq: Once | ORAL | Status: AC
  Administered 2020-11-16: 25 mg via ORAL

## 2020-11-16 MED ORDER — ACETAMINOPHEN 325 MG PO TABS
ORAL_TABLET | ORAL | Status: AC
  Filled 2020-11-16: qty 2

## 2020-11-16 MED ORDER — TRASTUZUMAB-ANNS CHEMO 150 MG IV SOLR
6.0000 mg/kg | Freq: Once | INTRAVENOUS | Status: AC
  Administered 2020-11-16: 420 mg via INTRAVENOUS
  Filled 2020-11-16: qty 20

## 2020-11-16 MED ORDER — SODIUM CHLORIDE 0.9% FLUSH
10.0000 mL | INTRAVENOUS | Status: DC | PRN
  Administered 2020-11-16: 10 mL
  Filled 2020-11-16: qty 10

## 2020-11-16 MED ORDER — TAMOXIFEN CITRATE 20 MG PO TABS
20.0000 mg | ORAL_TABLET | Freq: Every day | ORAL | 12 refills | Status: AC
  Filled 2020-11-16: qty 90, 90d supply, fill #0

## 2020-11-16 MED ORDER — SODIUM CHLORIDE 0.9 % IV SOLN
Freq: Once | INTRAVENOUS | Status: AC
  Filled 2020-11-16: qty 250

## 2020-11-16 MED ORDER — ACETAMINOPHEN 325 MG PO TABS
650.0000 mg | ORAL_TABLET | Freq: Once | ORAL | Status: AC
  Administered 2020-11-16: 650 mg via ORAL

## 2020-11-16 MED ORDER — DIPHENHYDRAMINE HCL 25 MG PO CAPS
ORAL_CAPSULE | ORAL | Status: AC
  Filled 2020-11-16: qty 1

## 2020-11-16 MED ORDER — SODIUM CHLORIDE 0.9% FLUSH
10.0000 mL | Freq: Once | INTRAVENOUS | Status: AC
Start: 2020-11-16 — End: 2020-11-16
  Administered 2020-11-16: 10 mL
  Filled 2020-11-16: qty 10

## 2020-11-16 MED ORDER — HEPARIN SOD (PORK) LOCK FLUSH 100 UNIT/ML IV SOLN
500.0000 [IU] | Freq: Once | INTRAVENOUS | Status: AC | PRN
  Administered 2020-11-16: 500 [IU]
  Filled 2020-11-16: qty 5

## 2020-11-16 NOTE — Patient Instructions (Addendum)
Trastuzumab injection for infusion Qu es este medicamento? El TRASTUZUMAB es un anticuerpo monoclonal. Genene Churn para tratar el cncer de mama y de [place]. Este medicamento puede ser utilizado para otros usos; si tiene alguna pregunta consulte con su proveedor de atencin mdica o con su farmacutico. MARCAS COMUNES: Herceptin, Belenda Cruise, Og[place]ri, Ontruzant, Trazimera Qu le debo informar a mi profesional de la salud antes de tomar este medicamento? Necesitan saber si usted presenta alguno de los siguientes problemas o situaciones: enfermedad cardiaca insuficiencia cardiaca enfermedad pulmonar o respiratoria, como asma una reaccin alrgica o inusual al trastuzumab, al alcohol benclico, a otros medicamentos, alimentos, colorantes o conservantes si est embarazada o buscando quedar embarazada si est amamantando a un beb Cmo debo utilizar este medicamento? Este medicamento se administra mediante infusin por va intravenosa. Lo administra un profesional de la salud calificado en un hospital o en un entorno clnico. Hable con su pediatra para informarse acerca del uso de este medicamento en nios. Este medicamento no est aprobado para uso en nios. Sobredosis: Pngase en contacto inmediatamente con un centro toxicolgico o una sala de urgencia si usted cree que haya tomado demasiado medicamento. ATENCIN: [employer] es solo para usted. No comparta este medicamento con nadie. Qu sucede si me olvido de una dosis? Es importante no olvidar ninguna dosis. Informe a su mdico o a su profesional de la salud si no puede asistir a Photographer. Qu puede interactuar con este medicamento? Este medicamento podra interactuar con los siguientes frmacos: ciertos tipos de quimioterapia, tales como daunorubicina, [place], [place], e idarubicina Puede ser que esta lista no menciona todas las posibles interacciones. Informe a su profesional de [employer]	[place] de [employer] productos a base  de hierbas, medicamentos de [place] o suplementos nutritivos que est tomando. Si usted fuma, consume bebidas alcohlicas o si utiliza drogas ilegales, indqueselo tambin a su profesional de [employer]	[place]. Algunas sustancias pueden interactuar con su medicamento. A qu debo estar atento al usar [employer]? Visite a su mdico para chequear su evolucin peridicamente. Si presenta algn efecto secundario, infrmelo. Sin embargo, contine con el tratamiento aun si se siente enfermo, a menos que su mdico le indique que lo suspenda. Consulte a su mdico o a su profesional de la salud si tiene fiebre, escalofros, dolor de garganta, o cualquier otro sntoma de resfro o gripe. No se trate usted mismo. Trate de no acercarse a personas que estn enfermas. Es posible que tenga [place], escalofros y temblores durante su primera infusin. Estos efectos generalmente son leves y pueden tratarse con otros medicamentos. Informe cualquier efecto secundario durante la infusin a su profesional de [employer]	[place]. La fiebre y los escalofros por lo general no suceden con las infusiones posteriores. No debe quedar embarazada mientras est tomando este medicamento o por 7 meses despus de dejar de usarlo. Las mujeres deben informar a su mdico si estn buscando quedar embarazadas o si creen que estn embarazadas. Las mujeres con la posibilidad de Best boy nios deben tener una prueba de embarazo negativa antes de Art gallery manager a tomar este medicamento. Existe la posibilidad de que ocurran efectos secundarios graves a un beb sin nacer. Para ms informacin hable con su profesional de la salud o su farmacutico. No debe amamantar a un beb mientras est tomando este medicamento o por 7 meses despus de dejar de usarlo. Las [employer] deben usar un mtodo anticonceptivo eficaz con este medicamento. Qu efectos secundarios puedo tener al [employer] este medicamento? Efectos secundarios que debe informar a su mdico o  a su profesional de la  salud tan pronto como sea posible: Chief of Staff, como erupcin cutnea, comezn/picazn o urticaria, e hinchazn de la cara, los labios o la lengua dolor en el pecho o palpitaciones tos mareos sensacin de Youth worker o aturdimiento, cadas fiebre sensacin general de estar enfermo o sntomas gripales signos de empeoramiento de la insuficiencia cardiaca, tales como problemas respiratorios; hinchazn en las piernas y los pies cansancio o debilidad inusual Efectos secundarios que generalmente no requieren atencin mdica (infrmelos a su mdico o a Barrister's clerk de la salud si persisten o si son molestos): dolor de Psychiatric nurse sentido del gusto diarrea dolor en las articulaciones nuseas, vmito prdida de peso Puede ser que esta lista no menciona todos los posibles efectos secundarios. Comunquese a su mdico por asesoramiento mdico [employer] Usted puede informar los efectos secundarios a la FDA por telfono al 1-800-FDA-1088. Dnde debo guardar mi medicina? Este medicamento se administra en hospitales o clnicas y no necesitar guardarlo en su domicilio. ATENCIN: Este folleto es un resumen. Puede ser que no cubra toda la posible informacin. Si usted tiene preguntas acerca de esta medicina, consulte con su mdico, su farmacutico o su profesional de Technical sales engineer.  2021 Elsevier/Gold Standard (2019-12-15 00:00:00)

## 2020-11-16 NOTE — Telephone Encounter (Signed)
Scheduled appointment per 05/25 los. Patient will receive updated calender.

## 2020-11-17 ENCOUNTER — Ambulatory Visit: Payer: Self-pay | Admitting: Physical Therapy

## 2020-11-17 ENCOUNTER — Encounter: Payer: Self-pay | Admitting: Physical Therapy

## 2020-11-17 DIAGNOSIS — G8929 Other chronic pain: Secondary | ICD-10-CM

## 2020-11-17 DIAGNOSIS — M533 Sacrococcygeal disorders, not elsewhere classified: Secondary | ICD-10-CM

## 2020-11-17 NOTE — Therapy (Signed)
[place], [place], [address] Phone: [phone]   Fax:  [phone]  Physical Therapy Treatment  Patient Details  Name: Jill [name] MRN: [id number] Date of Birth: [birth year] Referring Provider (PT): Dr. [name]   Encounter Date: 11/17/2020   PT End of Session - 11/17/20 1047    Visit Number 3    Number of Visits 8    Date for PT Re-Evaluation 12/27/20    Authorization Type self pay, CAFA    Authorization Time Period 10/22    PT Start Time 1047    PT Stop Time 1125    PT Time Calculation (min) 38 min    Activity Tolerance Patient tolerated treatment well    Behavior During Therapy [facility] for tasks assessed/performed           Past Medical History:  Diagnosis Date  . Breast cancer ([place]) 03/07/2020  . Infected cyst of Bartholin's gland duct   . Medical history non-contributory     Past Surgical History:  Procedure Laterality Date  . IR IMAGING GUIDED PORT INSERTION  03/02/2020  . MASTECTOMY W/ SENTINEL NODE BIOPSY Right 03/07/2020   Procedure: RIGHT MASTECTOMY WITH SENTINEL LYMPH NODE BIOPSY;  Surgeon: Jovita Kussmaul, MD;  Location: [place];  Service: General;  Laterality: Right;  . NO PAST SURGERIES    . WISDOM TOOTH EXTRACTION      There were no vitals filed for this visit.   Subjective Assessment - 11/17/20 1051    Subjective Patient reports a lot better today, good compiance with HEP.    Patient is accompained by: Interpreter   edwardo #[id number]   Limitations Sitting;House hold activities;Lifting;Standing;Walking    Diagnostic tests Grade 1 anterolisthesis of L5 on S1.Disc desiccation. Bilateral facet hypertrophy/arthropathy.Chronic bilateral sacroiliitis    Patient Stated Goals Patient would like to get exercises to help her feel better.    Currently in Pain? Yes    Pain Score 0-No pain    Pain Location Back    Multiple Pain Sites Yes    Pain Score  4    Pain Location Other (Comment)   B sits bones   Pain Orientation Left              [facility] PT Assessment - 11/17/20 0001      Assessment   Medical Diagnosis low back pain , coccyx pain    Referring Provider (PT) Dr. [name]                         [facility] Adult PT Treatment/Exercise - 11/17/20 0001      Lumbar Exercises: Standing   Wall Slides 10 reps    Wall Slides Limitations with BALL 2sets with thor ext on ball between    Scapular Retraction 10 reps   3sec   Other Standing Lumbar Exercises Pallof Press GREEN 2x10 ea side      Lumbar Exercises: Seated   Long Arc Quad on [place] 10 reps    Other Seated Lumbar Exercises Marching on ball x10 after weight shift R/L    Other Seated Lumbar Exercises Kegals on ball x10      Lumbar Exercises: Supine   Bridge with Ball Squeeze 10 reps;2 seconds    Bridge with [employer] Limitations 2 sets    Other Supine Lumbar Exercises Lower trunk rotation B LEs on ball x10 ea dir      Lumbar  Exercises: Sidelying   Clam --    Hip Abduction --      Manual Therapy   Manual Therapy Soft tissue mobilization    Manual therapy comments B UTs/levators followed by self stretch    Soft tissue mobilization Hooklying press into Tball 10x 2sec                  PT Education - 11/17/20 1128    Education Details Recommended patient perform Kegals 5x/day, any position.  Procure seat cusion.    Person(s) Educated Patient    Methods Explanation;Demonstration;Verbal cues    Comprehension Verbalized understanding;Returned demonstration;Verbal cues required            PT Short Term Goals - 11/01/20 1638      PT SHORT TERM GOAL #1   Title Pt will be I with HEP for low back pain    Time 4    Period Weeks    Status New    Target Date 11/29/20      PT SHORT TERM GOAL #2   Title Pt will be able to acquire seat cushion for more comfort at rest, seated.    Time 4    Period Weeks    Status New    Target Date 11/29/20              PT Long Term Goals - 11/01/20 1639      PT LONG TERM GOAL #1   Title Pt will be able to report ability to sit for transportation with min pain overall to and from therapy.    Time 8    Period Weeks    Status New    Target Date 12/27/20      PT LONG TERM GOAL #2   Title Pt will be I with HEP for core and hips, trunk flexibility    Time 8    Period Weeks    Status New    Target Date 12/27/20      PT LONG TERM GOAL #3   Title Pt will be able to properly lift and squat without increase in pain for work and childcare duties    Time 8    Period Weeks    Status New    Target Date 12/27/20      PT LONG TERM GOAL #4   Title Pt will be able to increase hip strength to 5/5 in al planes in order to support posture and balance    Time 8    Period Weeks    Status New    Target Date 12/27/20                 Plan - 11/17/20 1129    Clinical Impression Statement Patient intensity and frequency continues to decrease, patient reports no current back pain but continues with B ischial tuberosity pain with prolonged sitting.  Patient given addition of pelvic floor exercise for home.  Patient will benefit from continued skilled PT to address deficits and maximzie safe funcitonal mobility    Personal Factors and Comorbidities Comorbidity 1;Time since onset of injury/illness/exacerbation;Transportation    Comorbidities cancer    Examination-Activity Limitations Squat;Bed Mobility;Lift;Stairs;Bend;Locomotion Level;Stand;Toileting;Transfers;Carry;Sit    Examination-Participation Restrictions Community Activity;Occupation;Interpersonal Relationship;Cleaning;Laundry    PT Treatment/Interventions ADLs/Self Care Home Management;Therapeutic activities;Manual techniques;Patient/family education;Therapeutic exercise;Moist Heat;Cryotherapy;Functional mobility training;Passive range of motion    PT Next Visit Plan Assess effectiveness of HEP and progress as appropriate. Continue pelvic and  core stability and shoulder stability.    PT  Home Exercise Plan [id number]    Consulted and Agree with Plan of Care Patient           Patient will benefit from skilled therapeutic intervention in order to improve the following deficits and impairments:  Decreased mobility,Hypomobility,Decreased range of motion,Decreased strength,Increased fascial restricitons,Impaired flexibility,Postural dysfunction,Pain  Visit Diagnosis: Coccyx pain  Chronic midline low back pain with bilateral sciatica     Problem List Patient Active Problem List   Diagnosis Date Noted  . Port-A-Cath in place 05/11/2020  . Genetic testing 03/04/2020  . Malignant neoplasm of upper-outer quadrant of right breast in female, estrogen receptor positive ([place]) 02/10/2020  . [redacted] weeks gestation of pregnancy 03/16/2019  . Normal labor 03/15/2019    Pollyann Samples, PT 11/17/2020, 12:55 PM  [facility] [address] [place], [place], [address] Phone: [phone]   Fax:  [phone]  Name: Jill [name] MRN: [id number] Date of Birth: 05/19/1976

## 2020-11-17 NOTE — Patient Instructions (Signed)
Addition of Kegals 5x/day without handout.

## 2020-11-24 ENCOUNTER — Telehealth: Payer: Self-pay | Admitting: Physical Therapy

## 2020-11-24 ENCOUNTER — Ambulatory Visit: Payer: Self-pay | Attending: Family Medicine | Admitting: Physical Therapy

## 2020-11-24 DIAGNOSIS — M5442 Lumbago with sciatica, left side: Secondary | ICD-10-CM | POA: Insufficient documentation

## 2020-11-24 DIAGNOSIS — G8929 Other chronic pain: Secondary | ICD-10-CM | POA: Insufficient documentation

## 2020-11-24 DIAGNOSIS — M5441 Lumbago with sciatica, right side: Secondary | ICD-10-CM | POA: Insufficient documentation

## 2020-11-24 DIAGNOSIS — M533 Sacrococcygeal disorders, not elsewhere classified: Secondary | ICD-10-CM | POA: Insufficient documentation

## 2020-11-24 NOTE — Telephone Encounter (Signed)
Called patient using interpreter service Jill [name] [phone] following missed visit this morning.  Patient reports her [facility] transportation did not show, that she called our office and was rescheduled for next [place] 12/01/2020 at 10:45 and she has arranged transportation, is planning to attend.

## 2020-11-27 NOTE — Progress Notes (Signed)
  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 During treatment she did very well with radiotherapy and did not have significant desquamation.   Impression/Plan: 1. 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 I attempted to call with the interpretor line but was disconnected. We will be happy to continue to follow her as needed, but she will also continue to follow up with Dr. Jana Hakim in medical oncology.       Carola Rhine, PAC

## 2020-11-28 ENCOUNTER — Ambulatory Visit
Admission: RE | Admit: 2020-11-28 | Discharge: 2020-11-28 | Disposition: A | Discharge status: Home / Self Care | Payer: Self-pay | Source: Ambulatory Visit | Attending: Oncology | Admitting: Oncology

## 2020-11-28 DIAGNOSIS — C50411 Malignant neoplasm of upper-outer quadrant of right female breast: Secondary | ICD-10-CM

## 2020-11-28 DIAGNOSIS — Z170 Estrogen receptor positive status [ER+]: Secondary | ICD-10-CM

## 2020-12-01 ENCOUNTER — Ambulatory Visit: Payer: Self-pay | Admitting: Physical Therapy

## 2020-12-06 ENCOUNTER — Other Ambulatory Visit: Payer: Self-pay | Admitting: Oncology

## 2020-12-06 ENCOUNTER — Ambulatory Visit: Payer: Self-pay | Attending: Oncology | Admitting: Physical Therapy

## 2020-12-06 ENCOUNTER — Encounter: Payer: Self-pay | Admitting: Physical Therapy

## 2020-12-06 ENCOUNTER — Other Ambulatory Visit: Payer: Self-pay

## 2020-12-06 ENCOUNTER — Encounter: Payer: Self-pay | Admitting: *Deleted

## 2020-12-06 DIAGNOSIS — M25611 Stiffness of right shoulder, not elsewhere classified: Secondary | ICD-10-CM | POA: Insufficient documentation

## 2020-12-06 DIAGNOSIS — Z483 Aftercare following surgery for neoplasm: Secondary | ICD-10-CM | POA: Insufficient documentation

## 2020-12-06 DIAGNOSIS — L599 Disorder of the skin and subcutaneous tissue related to radiation, unspecified: Secondary | ICD-10-CM | POA: Insufficient documentation

## 2020-12-06 NOTE — Therapy (Signed)
[place], [place], [address] Phone: [phone]   Fax:  [phone]  Physical Therapy Evaluation  Patient Details  Name: Jill [name] MRN: [id number] Date of Birth: [birth year] Referring Provider (PT): Magrinat   Encounter Date: 12/06/2020   PT End of Session - 12/06/20 1605     Visit Number 1   1 of 9 for cancer rehab   Number of Visits 9    Date for PT Re-Evaluation 01/03/21    Authorization Type self pay, CAFA    PT Start Time 1503    PT Stop Time 1604    PT Time Calculation (min) 61 min    Activity Tolerance Patient tolerated treatment well    Behavior During Therapy [facility] for tasks assessed/performed             Past Medical History:  Diagnosis Date   Breast cancer ([place]) 03/07/2020   Infected cyst of Bartholin's gland duct    Medical history non-contributory     Past Surgical History:  Procedure Laterality Date   IR IMAGING GUIDED PORT INSERTION  03/02/2020   MASTECTOMY W/ SENTINEL NODE BIOPSY Right 03/07/2020   Procedure: RIGHT MASTECTOMY WITH SENTINEL LYMPH NODE BIOPSY;  Surgeon: Jovita Kussmaul, MD;  Location: [facility];  Service: General;  Laterality: Right;   NO PAST SURGERIES     WISDOM TOOTH EXTRACTION      There were no vitals filed for this visit.    Subjective Assessment - 12/06/20 1505     Subjective My surgery was Sept 13, 2021. At the beginning my shoulder was worse. I can do more now but it is still limited. I can not reach above my head. I don't know if it is my bone or my skin that limits me.    Pertinent History Breast CA 03/07/20, with chemo and radiation, mastecomy with SLNB (1/3)  03/07/20    Patient Stated Goals Patient would like to get exercises to help her feel better, to feel well, be able to raise arm all the way    Currently in Pain? Yes    Pain Score 5     Pain Location Axilla    Pain Orientation Right    Pain  Descriptors / Indicators Burning    Pain Type Chronic pain    Pain Onset More than a month ago    Pain Frequency Intermittent    Aggravating Factors  moving the arm    Effect of Pain on Daily Activities does not effect daily activites much                [facility] PT Assessment - 12/06/20 0001       Assessment   Medical Diagnosis R breast cancer    Referring Provider (PT) Magrinat    Onset Date/Surgical Date 03/07/20    Hand Dominance Right    Prior Therapy currently receiving therapy for back and coccyx pain      Precautions   Precautions Other (comment)    Precaution Comments at risk for lymphedema      Restrictions   Weight Bearing Restrictions No      Balance Screen   Has the patient fallen in the past 6 months No    Has the patient had a decrease in activity level because of a fear of falling?  No    Is the patient reluctant to leave their home because of a fear of falling?  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    Leisure pt does PT exercises for back rehab      Cognition   Overall Cognitive Status Within Functional Limits for tasks assessed      Observation/Other Assessments   Observations swelling on R lateral trunk and upper chest, tightness palpable across R chest, small nodule palpable under fold anterior to axilla      Posture/Postural Control   Posture/Postural Control Postural limitations    Postural Limitations Rounded Shoulders;Forward head      ROM / Strength   AROM / PROM / Strength AROM      AROM   AROM Assessment Site Shoulder    Right/Left Shoulder Right;Left    Right Shoulder Extension 60  Degrees    Right Shoulder Flexion 145 Degrees    Right Shoulder ABduction 153 Degrees    Right Shoulder Internal Rotation 49 Degrees    Right Shoulder External Rotation 82 Degrees    Left Shoulder Extension 71 Degrees    Left Shoulder Flexion 167 Degrees    Left Shoulder ABduction 179 Degrees    Left Shoulder Internal Rotation 56 Degrees    Left Shoulder External Rotation 86 Degrees               LYMPHEDEMA/ONCOLOGY QUESTIONNAIRE - 12/06/20 0001       Type   Cancer Type right breast cancer      Surgeries   Mastectomy Date 03/07/20    Sentinel Lymph Node Biopsy Date 03/07/20    Number Lymph Nodes Removed 3   1 was positive     Treatment   Past Chemotherapy Treatment Yes    Past Radiation Treatment Yes      What other symptoms do you have   Are you Having Heaviness or Tightness Yes    Are you having Pain Yes    Are you having pitting edema No    Is it Hard or Difficult finding clothes that fit No    Do you have infections No    Is there Decreased scar mobility Yes      Lymphedema Assessments   Lymphedema Assessments Upper extremities      Right Upper Extremity Lymphedema   15 cm Proximal to Olecranon Process 34.7 cm    Olecranon Process 25.6 cm    15 cm Proximal to Ulnar Styloid Process 25.5 cm    Just Proximal to Ulnar Styloid Process 16.3 cm    Across Hand at [employer] 19 cm    At [place] of 2nd Digit 6.3 cm      Left Upper Extremity Lymphedema   15 cm Proximal to Olecranon Process 33.5 cm    Olecranon Process 24.2 cm    15 cm Proximal to Ulnar Styloid Process 24.2 cm    Just Proximal to Ulnar Styloid Process 15.4 cm    Across Hand at [employer] 18.7 cm    At Home of 2nd Digit 5.8 cm                     Objective measurements completed on  examination: See above findings.                 PT Short Term Goals - 11/01/20 1638       PT SHORT TERM GOAL #1   Title Pt will be I with HEP for low back pain    Time 4     Period Weeks    Status New    Target Date 11/29/20      PT SHORT TERM GOAL #2   Title Pt will be able to acquire seat cushion for more comfort at rest, seated.    Time 4    Period Weeks    Status New    Target Date 11/29/20               PT Long Term Goals - 12/06/20 1614       PT LONG TERM GOAL #5   Title Pt will demonstrate 165 degrees of R shoulder flexion to allow her to reach overhead.    Baseline 145    Time 4    Period Weeks    Status New    Target Date 01/03/21      Additional Long Term Goals   Additional Long Term Goals Yes      PT LONG TERM GOAL #6   Title Pt will demonstrate 165 degrees of R shoulder abduction to allow her to reach out to the side.    Baseline 153    Time 4    Period Weeks    Status New    Target Date 01/03/21      PT LONG TERM GOAL #7   Title Pt will be independent in a home exercise program for continued stretching and strengthening.    Time 4    Period Weeks    Status New    Target Date 01/03/21      PT LONG TERM GOAL #8   Title Pt will report a 50% improvement in pain across R chest to allow improved comfort.    Time 4    Period Weeks    Status New    Target Date 01/03/21                    Plan - 12/06/20 1607     Clinical Impression Statement Pt presents to PT with decreased R shoulder ROM following a R mastectomy and R SLNB (1/3) on 03/07/20. Pt has completed chemo and radiation. She has tightness across R mastectomy scar with swelling present in R anterior axilla and R lateral trunk. She has difficulty lifting her arm overhead and carrying her child due to discomfort across her chest. Pt would benefit from skilled PT services to improve R shoulder ROM, decrease tightness/fibrosis in R axilla and decrease swelling. During exam today a small nodule was palpated under fold anterior to R axilla so sent an inbasket message to pt's oncologist and also let him know that pt reports blurry vision since chemo.    Personal  Factors and Comorbidities Comorbidity 1;Time since onset of injury/illness/exacerbation;Transportation    Examination-Activity Limitations Carry;Caring for Others;Reach Overhead;Lift    Stability/Clinical Decision Making Stable/Uncomplicated    Clinical Decision Making Low    Rehab Potential Good    PT Frequency 2x / week    PT Duration 4 weeks    PT Treatment/Interventions ADLs/Self Care Home Management;Therapeutic exercise;Patient/family education;Manual techniques;Manual lymph drainage;Compression bandaging;Scar mobilization;Passive range of motion;Taping    PT Next Visit Plan begin PROM to R shoulder,  MLD to R lateral trunk and anterior axilla, AAROM exercises    Consulted and Agree with Plan of Care Patient             Patient will benefit from skilled therapeutic intervention in order to improve the following deficits and impairments:  Decreased range of motion, Decreased strength, Increased fascial restricitons, Impaired flexibility, Postural dysfunction, Pain, Decreased scar mobility, Decreased knowledge of precautions, Increased edema  Visit Diagnosis: Stiffness of right shoulder, not elsewhere classified  Disorder of the skin and subcutaneous tissue related to radiation, unspecified  Aftercare following surgery for neoplasm     Problem List Patient Active Problem List   Diagnosis Date Noted   Port-A-Cath in place 05/11/2020   Genetic testing 03/04/2020   Malignant neoplasm of upper-outer quadrant of right breast in female, estrogen receptor positive ([place]) 02/10/2020   [redacted] weeks gestation of pregnancy 03/16/2019   Normal labor 03/15/2019    Allyson Sabal [facility] 12/06/2020, 4:17 PM  [place], [place], [address] Phone: [phone]   Fax:  [phone]  Name: Jill [name] MRN: [id number] Date of Birth: February 09, 1976  Manus Gunning, PT 12/06/20 4:17 PM

## 2020-12-07 ENCOUNTER — Encounter: Payer: Self-pay | Admitting: Licensed Clinical Social Worker

## 2020-12-07 ENCOUNTER — Other Ambulatory Visit: Payer: Self-pay

## 2020-12-07 ENCOUNTER — Inpatient Hospital Stay: Payer: Self-pay | Attending: Oncology

## 2020-12-07 ENCOUNTER — Other Ambulatory Visit: Payer: No Typology Code available for payment source

## 2020-12-07 ENCOUNTER — Other Ambulatory Visit: Payer: Self-pay | Admitting: Oncology

## 2020-12-07 ENCOUNTER — Inpatient Hospital Stay: Payer: Self-pay

## 2020-12-07 VITALS — BP 122/80 | HR 79 | Temp 98.6°F | Wt 160.3 lb

## 2020-12-07 DIAGNOSIS — C50411 Malignant neoplasm of upper-outer quadrant of right female breast: Secondary | ICD-10-CM | POA: Insufficient documentation

## 2020-12-07 DIAGNOSIS — Z9011 Acquired absence of right breast and nipple: Secondary | ICD-10-CM | POA: Insufficient documentation

## 2020-12-07 DIAGNOSIS — Z170 Estrogen receptor positive status [ER+]: Secondary | ICD-10-CM

## 2020-12-07 DIAGNOSIS — C773 Secondary and unspecified malignant neoplasm of axilla and upper limb lymph nodes: Secondary | ICD-10-CM | POA: Insufficient documentation

## 2020-12-07 DIAGNOSIS — Z5112 Encounter for antineoplastic immunotherapy: Secondary | ICD-10-CM | POA: Insufficient documentation

## 2020-12-07 DIAGNOSIS — Z79810 Long term (current) use of selective estrogen receptor modulators (SERMs): Secondary | ICD-10-CM | POA: Insufficient documentation

## 2020-12-07 DIAGNOSIS — Z95828 Presence of other vascular implants and grafts: Secondary | ICD-10-CM

## 2020-12-07 DIAGNOSIS — Z79899 Other long term (current) drug therapy: Secondary | ICD-10-CM | POA: Insufficient documentation

## 2020-12-07 LAB — COMPREHENSIVE METABOLIC PANEL
ALT: 25 U/L (ref 0–44)
AST: 23 U/L (ref 15–41)
Albumin: 3.9 g/dL (ref 3.5–5.0)
Alkaline Phosphatase: 82 U/L (ref 38–126)
Anion gap: 9 (ref 5–15)
BUN: 8 mg/dL (ref 6–20)
CO2: 26 mmol/L (ref 22–32)
Calcium: 9.5 mg/dL (ref 8.9–10.3)
Chloride: 107 mmol/L (ref 98–111)
Creatinine, Ser: 0.7 mg/dL (ref 0.44–1.00)
GFR, Estimated: >60 mL/min (ref >60)
Glucose, Bld: 99 mg/dL (ref 70–99)
Potassium: 4 mmol/L (ref 3.5–5.1)
Sodium: 142 mmol/L (ref 135–145)
Total Bilirubin: 0.5 mg/dL (ref 0.3–1.2)
Total Protein: 6.9 g/dL (ref 6.5–8.1)

## 2020-12-07 LAB — CBC WITH DIFFERENTIAL/PLATELET
Abs Immature Granulocytes: 0.01 10*3/uL (ref 0.00–0.07)
Basophils Absolute: 0 10*3/uL (ref 0.0–0.1)
Basophils Relative: 1 %
Eosinophils Absolute: 0 10*3/uL (ref 0.0–0.5)
Eosinophils Relative: 1 %
HCT: 38.9 % (ref 36.0–46.0)
Hemoglobin: 13.5 g/dL (ref 12.0–15.0)
Immature Granulocytes: 0 %
Lymphocytes Relative: 24 %
Lymphs Abs: 0.7 10*3/uL (ref 0.7–4.0)
MCH: 31.3 pg (ref 26.0–34.0)
MCHC: 34.7 g/dL (ref 30.0–36.0)
MCV: 90 fL (ref 80.0–100.0)
Monocytes Absolute: 0.3 10*3/uL (ref 0.1–1.0)
Monocytes Relative: 8 %
Neutro Abs: 2 10*3/uL (ref 1.7–7.7)
Neutrophils Relative %: 66 %
Platelets: 321 10*3/uL (ref 150–400)
RBC: 4.32 MIL/uL (ref 3.87–5.11)
RDW: 13.2 % (ref 11.5–15.5)
WBC: 3.1 10*3/uL — ABNORMAL LOW (ref 4.0–10.5)
nRBC: 0 % (ref 0.0–0.2)

## 2020-12-07 MED ORDER — SODIUM CHLORIDE 0.9 % IV SOLN
Freq: Once | INTRAVENOUS | Status: AC
  Filled 2020-12-07: qty 250

## 2020-12-07 MED ORDER — SODIUM CHLORIDE 0.9% FLUSH
10.0000 mL | INTRAVENOUS | Status: DC | PRN
  Administered 2020-12-07: 10 mL
  Filled 2020-12-07: qty 10

## 2020-12-07 MED ORDER — DIPHENHYDRAMINE HCL 25 MG PO CAPS
25.0000 mg | ORAL_CAPSULE | Freq: Once | ORAL | Status: AC
Start: 2020-12-07 — End: 2020-12-07
  Administered 2020-12-07: 25 mg via ORAL

## 2020-12-07 MED ORDER — TRASTUZUMAB-ANNS CHEMO 150 MG IV SOLR
6.0000 mg/kg | Freq: Once | INTRAVENOUS | Status: AC
  Administered 2020-12-07: 420 mg via INTRAVENOUS
  Filled 2020-12-07: qty 20

## 2020-12-07 MED ORDER — ACETAMINOPHEN 325 MG PO TABS
ORAL_TABLET | ORAL | Status: AC
  Filled 2020-12-07: qty 2

## 2020-12-07 MED ORDER — DIPHENHYDRAMINE HCL 25 MG PO CAPS
ORAL_CAPSULE | ORAL | Status: AC
  Filled 2020-12-07: qty 1

## 2020-12-07 MED ORDER — ACETAMINOPHEN 325 MG PO TABS
650.0000 mg | ORAL_TABLET | Freq: Once | ORAL | Status: AC
Start: 2020-12-07 — End: 2020-12-07
  Administered 2020-12-07: 650 mg via ORAL

## 2020-12-07 MED ORDER — SODIUM CHLORIDE 0.9% FLUSH
10.0000 mL | Freq: Once | INTRAVENOUS | Status: AC
  Administered 2020-12-07: 10 mL
  Filled 2020-12-07: qty 10

## 2020-12-07 MED ORDER — HEPARIN SOD (PORK) LOCK FLUSH 100 UNIT/ML IV SOLN
500.0000 [IU] | Freq: Once | INTRAVENOUS | Status: AC | PRN
  Administered 2020-12-07: 500 [IU]
  Filled 2020-12-07: qty 5

## 2020-12-07 NOTE — Patient Instructions (Signed)
[facility]COL[facility]  Discharge Instructions: °Thank you for choosing [facility] to provide your oncology and hematology care.  ° °If you have a lab appointment with the Cancer Center, please go directly to the Cancer Center and check in at the registration area. °  °Wear comfortable clothing and clothing appropriate for easy access to any Portacath or PICC line.  ° °We strive to give you quality time with your provider. You may need to reschedule your appointment if you arrive late (15 or more minutes).  Arriving late affects you and other patients whose appointments are after yours.  Also, if you miss three or more appointments without notifying the office, you may be dismissed from the clinic at the provider’s discretion.    °  °For prescription refill requests, have your pharmacy contact our office and allow 72 hours for refills to be completed.   ° °Today you received the following chemotherapy and/or immunotherapy agents: Kanjinti °  °To help prevent nausea and vomiting after your treatment, we encourage you to take your nausea medication as directed. ° °BELOW ARE SYMPTOMS THAT SHOULD BE REPORTED IMMEDIATELY: °*FEVER GREATER THAN 100.4 F (38 °C) OR HIGHER °*CHILLS OR SWEATING °*NAUSEA AND VOMITING THAT IS NOT CONTROLLED WITH YOUR NAUSEA MEDICATION °*UNUSUAL SHORTNESS OF BREATH °*UNUSUAL BRUISING OR BLEEDING °*URINARY PROBLEMS (pain or burning when urinating, or frequent urination) °*BOWEL PROBLEMS (unusual diarrhea, constipation, pain near the anus) °TENDERNESS IN MOUTH AND THROAT WITH OR WITHOUT PRESENCE OF ULCERS (sore throat, sores in mouth, or a toothache) °UNUSUAL RASH, SWELLING OR PAIN  °UNUSUAL VAGINAL DISCHARGE OR ITCHING  ° °Items with * indicate a potential emergency and should be followed up as soon as possible or go to the Emergency Department if any problems should occur. ° °Please show the CHEMOTHERAPY ALERT CARD or IMMUNOTHERAPY ALERT CARD at check-in to the  Emergency Department and triage nurse. ° °Should you have questions after your visit or need to cancel or reschedule your appointment, please contact [facility]  Dept: [phone]  and follow the prompts.  Office hours are 8:00 a.m. to 4:30 p.m. Monday - Friday. Please note that voicemails left after 4:00 p.m. may not be returned until the following business day.  We are closed weekends and major holidays. You have access to a nurse at all times for urgent questions. Please call the main number to the clinic Dept: [phone] and follow the prompts. ° ° °For any non-urgent questions, you may also contact your provider using MyChart. We now offer e-Visits for anyone 18 and older to request care online for non-urgent symptoms. For details visit [email or web address]. °  °Also download the MyChart app! Go to the app store, search "MyChart", open the app, select [place], and log in with your MyChart username and password. ° °Due to Covid, a mask is required upon entering the hospital/clinic. If you do not have a mask, one will be given to you upon arrival. For doctor visits, patients may have 1 support person aged 18 or older with them. For treatment visits, patients cannot have anyone with them due to current Covid guidelines and our immunocompromised population.  ° °

## 2020-12-07 NOTE — Progress Notes (Signed)
[place] Work  Clinical Social Work was referred by PT for assessment of psychosocial needs.  Clinical Social Worker met with patient  to offer support and assess for needs.  Pt reports issues at home with psychological stress. Requesting resources today for support and possibly counseling. CSW provided information on [facility] and, with patient's permission, also called [facility] and spoke with spanish-speaking navigator Fabio Bering who will reach out to patient directly.     [place], [place] Worker [employer]

## 2020-12-07 NOTE — Progress Notes (Signed)
Jill [name] has been having some visual changes.  I spoke with her today in infusion.  She is not having any headaches, double vision, blacking out, nausea, vomiting, or balance issues.  Very likely she is having accommodation problems from the chemotherapy.  This should resolve with time.  She was told by her physical therapist that there was an irregularity in the right chest wall area.  She came back to her room when I examined her.  There is a slight irregularity there which seems to me to be a fluid pocket but I am setting her up for an ultrasound to confirm.

## 2020-12-08 ENCOUNTER — Ambulatory Visit: Payer: Self-pay | Admitting: Physical Therapy

## 2020-12-08 ENCOUNTER — Encounter: Payer: Self-pay | Admitting: Physical Therapy

## 2020-12-08 DIAGNOSIS — M5442 Lumbago with sciatica, left side: Secondary | ICD-10-CM

## 2020-12-08 DIAGNOSIS — M533 Sacrococcygeal disorders, not elsewhere classified: Secondary | ICD-10-CM

## 2020-12-08 NOTE — Therapy (Addendum)
[place], [place], [address] Phone: [phone]   Fax:  [phone]  Physical Therapy Treatment/DISCHARGE SUMMARY  Patient Details  Name: Jill [name] MRN: [id number] Date of Birth: 23-Nov-1975 Referring Provider (PT): Magrinat   Encounter Date: 12/08/2020   PT End of Session - 12/08/20 0747     Visit Number 4   back   Authorization Type self pay, CAFA    PT Start Time 0742    PT Stop Time 0830    PT Time Calculation (min) 48 min    Activity Tolerance Patient tolerated treatment well    Behavior During Therapy [facility] for tasks assessed/performed             Past Medical History:  Diagnosis Date   Breast cancer ([place]) 03/07/2020   Infected cyst of Bartholin's gland duct    Medical history non-contributory     Past Surgical History:  Procedure Laterality Date   IR IMAGING GUIDED PORT INSERTION  03/02/2020   MASTECTOMY W/ SENTINEL NODE BIOPSY Right 03/07/2020   Procedure: RIGHT MASTECTOMY WITH SENTINEL LYMPH NODE BIOPSY;  Surgeon: Jovita Kussmaul, MD;  Location: [facility];  Service: General;  Laterality: Right;   NO PAST SURGERIES     WISDOM TOOTH EXTRACTION      There were no vitals filed for this visit.   Subjective Assessment - 12/08/20 0744     Subjective Pt arrives with fatigue and pain in low back and tailbone.  If I am sitting for a long time my tailbone hurts more.  I have to sit a correct way or else it will bother me.    Currently in Pain? Yes    Pain Score 4     Pain Location Back    Pain Orientation Lower;Upper    Pain Descriptors / Indicators Aching    Pain Type Chronic pain    Pain Radiating Towards upper back    Pain Onset More than a month ago    Pain Frequency Intermittent    Aggravating Factors  sitting (back)    Pain Relieving Factors changing positions , standing , stretches                               [facility]  Adult PT Treatment/Exercise - 12/08/20 0001       Lumbar Exercises: Stretches   Single Knee to Chest Stretch Left;Right;3 reps;30 seconds    Lower Trunk Rotation 10 seconds    Lower Trunk Rotation Limitations x 10    Pelvic Tilt 10 reps    Piriformis Stretch 3 reps;60 seconds      Lumbar Exercises: Standing   Heel Raises 15 reps    Functional Squats 15 reps    Row Strengthening;Both;20 reps;Theraband    Theraband Level (Row) Level 4 (Blue)    Shoulder Extension Strengthening;Both;15 reps    Theraband Level (Shoulder Extension) Level 2 (Red)    Other Standing Lumbar Exercises horizontal abd red band x 10      Lumbar Exercises: Seated   Sit to Stand 15 reps    Sit to Stand Limitations 10 lb      Lumbar Exercises: Sidelying   Clam 15 reps    Clam Limitations green band    Hip Abduction 15 reps    Hip Abduction Weights (lbs) green band      Knee/Hip Exercises: Supine   Bridges Both;1  set;15 reps    Bridges Limitations green band    Bridges with Clamshell Both;1 set   x 10                     PT Short Term Goals - 12/08/20 0748       PT SHORT TERM GOAL #1   Title Pt will be I with HEP for low back pain    Status Achieved      PT SHORT TERM GOAL #2   Title Pt will be able to acquire seat cushion for more comfort at rest, seated.    Status Achieved               PT Long Term Goals - 12/08/20 0815       PT LONG TERM GOAL #1   Title Pt will be able to report ability to sit for transportation with min pain overall to and from therapy.    Baseline 4/10 today    Status On-going      PT LONG TERM GOAL #2   Title Pt will be I with HEP for core and hips, trunk flexibility    Baseline up to date    Status On-going      PT LONG TERM GOAL #3   Title Pt will be able to properly lift and squat without increase in pain for work and childcare duties    Status Achieved      PT LONG TERM GOAL #4   Title Pt will be able to increase hip strength to 5/5 in al  planes in order to support posture and balance    Status Unable to assess                   Plan - 12/08/20 0740     Clinical Impression Statement Patient with min pain in low back only with sitting extended periods of time.  She arrived 45 min early today and pain was 4/10. She can releive her back pain with changing positions and stretching . Sees Dr. Georgina Snell next week.  Will see what the MD says and likely DC from PT at that point for back as she continues CA rehab for shoulder, UE. She is reportedly not limited in sleeping, lifting or walking.    PT Treatment/Interventions ADLs/Self Care Home Management;Therapeutic exercise;Patient/family education;Manual techniques;Manual lymph drainage;Compression bandaging;Scar mobilization;Passive range of motion;Taping    PT Next Visit Plan begin PROM to R shoulder, MLD to R lateral trunk and anterior axilla, AAROM exercises    PT Home Exercise Plan [id number]    Consulted and Agree with Plan of Care Patient             Patient will benefit from skilled therapeutic intervention in order to improve the following deficits and impairments:  Decreased range of motion, Decreased strength, Increased fascial restricitons, Impaired flexibility, Postural dysfunction, Pain, Decreased scar mobility, Decreased knowledge of precautions, Increased edema  Visit Diagnosis: Coccyx pain  Chronic midline low back pain with bilateral sciatica     Problem List Patient Active Problem List   Diagnosis Date Noted   Port-A-Cath in place 05/11/2020   Genetic testing 03/04/2020   Malignant neoplasm of upper-outer quadrant of right breast in female, estrogen receptor positive ([place]) 02/10/2020   [redacted] weeks gestation of pregnancy 03/16/2019   Normal labor 03/15/2019    Jill [name] 12/08/2020, 9:13 AM  [place], [place], [address] Phone: [phone]  Fax:  [phone]  Name:  Jill [name] MRN: [id number] Date of Birth: 07-25-1975  Raeford Razor, PT 12/08/20 9:13 AM Phone: [phone] Fax: [phone]   PHYSICAL THERAPY DISCHARGE SUMMARY  Visits from Start of Care: 4 for back pain (also seeing Cancer Rehab)  Current functional level related to goals / functional outcomes: Continues with some activity limitations secondary back/tailbone pain at last visit.   Remaining deficits: Pain   Education / Equipment: HEP   Patient agrees to discharge. Patient goals were not met. Patient is being discharged due to not returning since the last visit. Patient has ongoing Cancer Rehab treatment, has not followed-up for orthopedic rehab for back pain >1 month and is not out of certification.  Patient will need new referral to return to Ortho Rehab.   Pollyann Samples, PT

## 2020-12-13 NOTE — Progress Notes (Signed)
I, Jill [name], LAT, ATC, am serving as scribe for Dr. [name].  Jill [name] Adjutant is a 45 y.o. female who presents to [place] at [facility] today for f/u of LBP and sacral/coccyx pain.  She was last seen by Dr. Georgina Snell on 11/02/20 and was advised to con't PT of which she has completed 4 sessions.  She previously had B SIJ injections on 10/03/20.  Today, pt reports pain is much improved, 60% better. Pt notes much relief from PT. No numbness/tingling noted.  Diagnostic imaging: L-spine and sacrum MRI- 09/24/20  Pertinent review of systems: No fevers or chills  Relevant historical information: History of breast cancer   Exam:  BP 116/80 (BP Location: Left Arm, Patient Position: Sitting, Cuff Size: Normal)   Pulse 93   Ht 4\' 10"  (1.473 m)   Wt 162 lb 12.8 oz (73.8 kg)   SpO2 98%   BMI 34.03 kg/m  General: Well Developed, well nourished, and in no acute distress.   MSK: L-spine nontender. Nontender SI joints.  Tender palpation coccyx.    Lab and Radiology Results    EXAM: MRI SACRUM WITH AND WITHOUT CONTRAST   TECHNIQUE: Multiplanar multi-sequence MR imaging of the sacrum was performed before and after IV contrast administration.   CONTRAST:  CONTRAST 7 cc Gadavist   COMPARISON:  CT pelvis 09/06/2020   FINDINGS: Urinary Tract:  Unremarkable   Bowel:  Unremarkable   Vascular/Lymphatic: Small bilateral inguinal lymph nodes are not pathologically enlarged.   Reproductive: T-shaped IUD satisfactorily positioned along the endometrium. Right Bartholin cyst noted.   Other:  No supplemental non-categorized findings.   Musculoskeletal: Symmetric reduced T1 and T2 signal along both sacroiliac joints reflecting the sclerosis seen on CT, without significant abnormal marrow edema or well-defined erosions. No effusion of the SI joints noted. Typical enhancement of the synovium observed.   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   There  is no impinging lesion along the sacral plexus or visualized proximal sciatic nerves. No abnormal enhancement or edema signal in the sciatic nerves.   No findings of regional pelvic metastatic disease.   IMPRESSION: 1. Chronic bilateral sacroiliitis, with sclerosis along the SI joints but no current edema, erosion, or effusion. 2. No findings of metastatic disease to the visualized pelvis. 3. Incidental IUD along the endometrium.  Incidental Bartholin cyst. 4. 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     Electronically Signed   By: Van Clines M.D.   On: 09/26/2020 19:34   I, [name], personally (independently) visualized and performed the interpretation of the images attached in this note.      Assessment and Plan: 45 y.o. female with burning pain in the coccyx region.  The SI joint pain and the low back pain has improved with SI joint injections on April 11 and physical therapy since then.  Now she is left with a bit of coccydynia.  This improved but not resolved with physical therapy.  Discussed options.  Plan for trial of pelvic physical therapy.  Additionally recommend trial of gabapentin at bedtime as she does have some burning pain when she sleeps is interfering with sleep.  Plan to recheck in 2 months.  If not better at that time would consider repeat Pete injection SI joint or possibly even injection coccyx itself.  Continue home exercise program to stabilize pars defects in  lumbar spine.   PDMP not reviewed this encounter. Orders Placed  This Encounter  Procedures   Ambulatory referral to Physical Therapy    Referral Priority:   Routine    Referral Type:   Physical Medicine    Referral Reason:   Specialty Services Required    Requested Specialty:   Physical Therapy    Number of Visits Requested:   1   Meds ordered this encounter  Medications   gabapentin (NEURONTIN) 300 MG capsule    Sig: Take 1 capsule (300 mg total) by mouth at  bedtime as needed (burning pain). Instructions in spanish    Dispense:  90 capsule    Refill:  1     Discussed warning signs or symptoms. Please see discharge instructions. Patient expresses understanding.   The above documentation has been reviewed and is accurate and complete [name], M.D.  Total encounter time 30 minutes including face-to-face time with the patient and, reviewing past medical record, and charting on the date of service.   Discussed treatment plan and options  Spanish interpreter used

## 2020-12-14 ENCOUNTER — Ambulatory Visit (INDEPENDENT_AMBULATORY_CARE_PROVIDER_SITE_OTHER): Payer: Self-pay | Admitting: Family Medicine

## 2020-12-14 ENCOUNTER — Other Ambulatory Visit: Payer: Self-pay

## 2020-12-14 ENCOUNTER — Encounter: Payer: Self-pay | Admitting: Oncology

## 2020-12-14 VITALS — BP 116/80 | HR 93 | Ht <= 58 in | Wt 162.8 lb

## 2020-12-14 DIAGNOSIS — M533 Sacrococcygeal disorders, not elsewhere classified: Secondary | ICD-10-CM

## 2020-12-14 DIAGNOSIS — M5442 Lumbago with sciatica, left side: Secondary | ICD-10-CM

## 2020-12-14 DIAGNOSIS — M4306 Spondylolysis, lumbar region: Secondary | ICD-10-CM

## 2020-12-14 DIAGNOSIS — M5441 Lumbago with sciatica, right side: Secondary | ICD-10-CM

## 2020-12-14 MED ORDER — GABAPENTIN 300 MG PO CAPS
300.0000 mg | ORAL_CAPSULE | Freq: Every evening | ORAL | 1 refills | Status: DC | PRN
  Filled 2020-12-14: qty 30, 30d supply, fill #0

## 2020-12-14 NOTE — Patient Instructions (Signed)
Thank you for coming in today.   Plan for Pelvic PT.   Regresse con 2 mesas

## 2020-12-15 ENCOUNTER — Other Ambulatory Visit: Payer: Self-pay

## 2020-12-23 ENCOUNTER — Other Ambulatory Visit: Payer: Self-pay | Admitting: Oncology

## 2020-12-28 ENCOUNTER — Inpatient Hospital Stay: Payer: Self-pay

## 2020-12-28 ENCOUNTER — Other Ambulatory Visit: Payer: Self-pay

## 2020-12-28 ENCOUNTER — Inpatient Hospital Stay: Payer: Self-pay | Attending: Oncology

## 2020-12-28 VITALS — BP 125/80 | HR 78 | Temp 98.3°F | Resp 18 | Wt 159.8 lb

## 2020-12-28 DIAGNOSIS — Z95828 Presence of other vascular implants and grafts: Secondary | ICD-10-CM

## 2020-12-28 DIAGNOSIS — Z5112 Encounter for antineoplastic immunotherapy: Secondary | ICD-10-CM | POA: Insufficient documentation

## 2020-12-28 DIAGNOSIS — C773 Secondary and unspecified malignant neoplasm of axilla and upper limb lymph nodes: Secondary | ICD-10-CM | POA: Insufficient documentation

## 2020-12-28 DIAGNOSIS — C50411 Malignant neoplasm of upper-outer quadrant of right female breast: Secondary | ICD-10-CM | POA: Insufficient documentation

## 2020-12-28 DIAGNOSIS — Z170 Estrogen receptor positive status [ER+]: Secondary | ICD-10-CM

## 2020-12-28 LAB — COMPREHENSIVE METABOLIC PANEL
ALT: 30 U/L (ref 0–44)
AST: 31 U/L (ref 15–41)
Albumin: 3.5 g/dL (ref 3.5–5.0)
Alkaline Phosphatase: 67 U/L (ref 38–126)
Anion gap: 8 (ref 5–15)
BUN: 11 mg/dL (ref 6–20)
CO2: 26 mmol/L (ref 22–32)
Calcium: 9 mg/dL (ref 8.9–10.3)
Chloride: 108 mmol/L (ref 98–111)
Creatinine, Ser: 0.68 mg/dL (ref 0.44–1.00)
GFR, Estimated: >60 mL/min (ref >60)
Glucose, Bld: 116 mg/dL — ABNORMAL HIGH (ref 70–99)
Potassium: 3.8 mmol/L (ref 3.5–5.1)
Sodium: 142 mmol/L (ref 135–145)
Total Bilirubin: 0.4 mg/dL (ref 0.3–1.2)
Total Protein: 6.7 g/dL (ref 6.5–8.1)

## 2020-12-28 LAB — CBC WITH DIFFERENTIAL/PLATELET
Abs Immature Granulocytes: 0.01 10*3/uL (ref 0.00–0.07)
Basophils Absolute: 0 10*3/uL (ref 0.0–0.1)
Basophils Relative: 1 %
Eosinophils Absolute: 0.1 10*3/uL (ref 0.0–0.5)
Eosinophils Relative: 1 %
HCT: 38 % (ref 36.0–46.0)
Hemoglobin: 13 g/dL (ref 12.0–15.0)
Immature Granulocytes: 0 %
Lymphocytes Relative: 23 %
Lymphs Abs: 0.8 10*3/uL (ref 0.7–4.0)
MCH: 31.5 pg (ref 26.0–34.0)
MCHC: 34.2 g/dL (ref 30.0–36.0)
MCV: 92 fL (ref 80.0–100.0)
Monocytes Absolute: 0.3 10*3/uL (ref 0.1–1.0)
Monocytes Relative: 7 %
Neutro Abs: 2.4 10*3/uL (ref 1.7–7.7)
Neutrophils Relative %: 68 %
Platelets: 313 10*3/uL (ref 150–400)
RBC: 4.13 MIL/uL (ref 3.87–5.11)
RDW: 12.6 % (ref 11.5–15.5)
WBC: 3.5 10*3/uL — ABNORMAL LOW (ref 4.0–10.5)
nRBC: 0 % (ref 0.0–0.2)

## 2020-12-28 MED ORDER — DIPHENHYDRAMINE HCL 25 MG PO CAPS
ORAL_CAPSULE | ORAL | Status: AC
  Filled 2020-12-28: qty 1

## 2020-12-28 MED ORDER — SODIUM CHLORIDE 0.9% FLUSH
10.0000 mL | INTRAVENOUS | Status: DC | PRN
  Administered 2020-12-28: 10 mL
  Filled 2020-12-28: qty 10

## 2020-12-28 MED ORDER — DIPHENHYDRAMINE HCL 25 MG PO CAPS
25.0000 mg | ORAL_CAPSULE | Freq: Once | ORAL | Status: AC
  Administered 2020-12-28: 25 mg via ORAL

## 2020-12-28 MED ORDER — SODIUM CHLORIDE 0.9% FLUSH
10.0000 mL | Freq: Once | INTRAVENOUS | Status: AC
  Administered 2020-12-28: 10 mL
  Filled 2020-12-28: qty 10

## 2020-12-28 MED ORDER — SODIUM CHLORIDE 0.9 % IV SOLN
Freq: Once | INTRAVENOUS | Status: AC
Start: 2020-12-28 — End: 2020-12-28
  Filled 2020-12-28: qty 250

## 2020-12-28 MED ORDER — ACETAMINOPHEN 325 MG PO TABS
ORAL_TABLET | ORAL | Status: AC
  Filled 2020-12-28: qty 2

## 2020-12-28 MED ORDER — ACETAMINOPHEN 325 MG PO TABS
650.0000 mg | ORAL_TABLET | Freq: Once | ORAL | Status: AC
  Administered 2020-12-28: 650 mg via ORAL

## 2020-12-28 MED ORDER — TRASTUZUMAB-ANNS CHEMO 150 MG IV SOLR
6.0000 mg/kg | Freq: Once | INTRAVENOUS | Status: AC
  Administered 2020-12-28: 420 mg via INTRAVENOUS
  Filled 2020-12-28: qty 20

## 2020-12-28 MED ORDER — HEPARIN SOD (PORK) LOCK FLUSH 100 UNIT/ML IV SOLN
500.0000 [IU] | Freq: Once | INTRAVENOUS | Status: AC | PRN
  Administered 2020-12-28: 500 [IU]
  Filled 2020-12-28: qty 5

## 2020-12-28 NOTE — Patient Instructions (Signed)
[facility]  Discharge Instructions: Thank you for choosing [facility] to provide your oncology and hematology care.   If you have a lab appointment with the Cancer Center, please go directly to the Cancer Center and check in at the registration area.   Wear comfortable clothing and clothing appropriate for easy access to any Portacath or PICC line.   We strive to give you quality time with your provider. You may need to reschedule your appointment if you arrive late (15 or more minutes).  Arriving late affects you and other patients whose appointments are after yours.  Also, if you miss three or more appointments without notifying the office, you may be dismissed from the clinic at the provider's discretion.      For prescription refill requests, have your pharmacy contact our office and allow 72 hours for refills to be completed.    Today you received the following chemotherapy and/or immunotherapy agents herceptin      To help prevent nausea and vomiting after your treatment, we encourage you to take your nausea medication as directed.  BELOW ARE SYMPTOMS THAT SHOULD BE REPORTED IMMEDIATELY: *FEVER GREATER THAN 100.4 F (38 C) OR HIGHER *CHILLS OR SWEATING *NAUSEA AND VOMITING THAT IS NOT CONTROLLED WITH YOUR NAUSEA MEDICATION *UNUSUAL SHORTNESS OF BREATH *UNUSUAL BRUISING OR BLEEDING *URINARY PROBLEMS (pain or burning when urinating, or frequent urination) *BOWEL PROBLEMS (unusual diarrhea, constipation, pain near the anus) TENDERNESS IN MOUTH AND THROAT WITH OR WITHOUT PRESENCE OF ULCERS (sore throat, sores in mouth, or a toothache) UNUSUAL RASH, SWELLING OR PAIN  UNUSUAL VAGINAL DISCHARGE OR ITCHING   Items with * indicate a potential emergency and should be followed up as soon as possible or go to the Emergency Department if any problems should occur.  Please show the CHEMOTHERAPY ALERT CARD or IMMUNOTHERAPY ALERT CARD at check-in to  the Emergency Department and triage nurse.  Should you have questions after your visit or need to cancel or reschedule your appointment, please contact [facility]  Dept: [phone]  and follow the prompts.  Office hours are 8:00 a.m. to 4:30 p.m. Monday - Friday. Please note that voicemails left after 4:00 p.m. may not be returned until the following business day.  We are closed weekends and major holidays. You have access to a nurse at all times for urgent questions. Please call the main number to the clinic Dept: [phone] and follow the prompts.   For any non-urgent questions, you may also contact your provider using MyChart. We now offer e-Visits for anyone 18 and older to request care online for non-urgent symptoms. For details visit [email or web address].   Also download the MyChart app! Go to the app store, search "MyChart", open the app, select [place], and log in with your MyChart username and password.  Due to Covid, a mask is required upon entering the hospital/clinic. If you do not have a mask, one will be given to you upon arrival. For doctor visits, patients may have 1 support person aged 18 or older with them. For treatment visits, patients cannot have anyone with them due to current Covid guidelines and our immunocompromised population.   

## 2020-12-29 ENCOUNTER — Ambulatory Visit: Payer: No Typology Code available for payment source | Attending: Oncology

## 2020-12-29 DIAGNOSIS — Z483 Aftercare following surgery for neoplasm: Secondary | ICD-10-CM | POA: Insufficient documentation

## 2020-12-29 DIAGNOSIS — R293 Abnormal posture: Secondary | ICD-10-CM | POA: Insufficient documentation

## 2020-12-29 DIAGNOSIS — R600 Localized edema: Secondary | ICD-10-CM | POA: Insufficient documentation

## 2020-12-29 DIAGNOSIS — M25611 Stiffness of right shoulder, not elsewhere classified: Secondary | ICD-10-CM | POA: Insufficient documentation

## 2020-12-29 DIAGNOSIS — I890 Lymphedema, not elsewhere classified: Secondary | ICD-10-CM | POA: Insufficient documentation

## 2020-12-29 DIAGNOSIS — M6281 Muscle weakness (generalized): Secondary | ICD-10-CM | POA: Insufficient documentation

## 2020-12-29 NOTE — Therapy (Signed)
[place], [place], [address] Phone: [phone]   Fax:  [phone]  Physical Therapy Treatment  Patient Details  Name: Jill [name] MRN: [id number] Date of Birth: Feb 21, 1976 Referring Provider (PT): Magrinat   Encounter Date: 12/29/2020   PT End of Session - 12/29/20 1459     Visit Number 2    Number of Visits 9   for Cancer Rehab   Date for PT Re-Evaluation 01/03/21    Authorization Type self pay, CAFA    Authorization Time Period 10/22    PT Start Time 1407    PT Stop Time 1455    PT Time Calculation (min) 48 min    Activity Tolerance Patient tolerated treatment well    Behavior During Therapy [facility] for tasks assessed/performed             Past Medical History:  Diagnosis Date   Breast cancer ([place]) 03/07/2020   Infected cyst of Bartholin's gland duct    Medical history non-contributory     Past Surgical History:  Procedure Laterality Date   IR IMAGING GUIDED PORT INSERTION  03/02/2020   MASTECTOMY W/ SENTINEL NODE BIOPSY Right 03/07/2020   Procedure: RIGHT MASTECTOMY WITH SENTINEL LYMPH NODE BIOPSY;  Surgeon: Jovita Kussmaul, MD;  Location: [place];  Service: General;  Laterality: Right;   NO PAST SURGERIES     WISDOM TOOTH EXTRACTION      There were no vitals filed for this visit.   Subjective Assessment - 12/29/20 1408     Subjective Having pain in both upper traps/upper back since surgery. At first I could not stretch my forearm but now I am able to .  It feels tight under the arm and at the right side.    Patient is accompained by: Interpreter   myra   Pertinent History Breast CA 03/07/20, with chemo and radiation, mastecomy with SLNB (1/3)  03/07/20    Limitations Sitting;House hold activities;Lifting;Standing;Walking    How long can you sit comfortably? < 10 min    Diagnostic tests Grade 1 anterolisthesis of L5 on S1.Disc desiccation.  Bilateral facet hypertrophy/arthropathy.Chronic bilateral sacroiliitis    Patient Stated Goals Patient would like to get exercises to help her feel better, to feel well, be able to raise arm all the way    Currently in Pain? Yes    Pain Score 8     Pain Location Neck    Pain Orientation Right;Left    Pain Descriptors / Indicators Aching;Spasm    Pain Onset More than a month ago    Pain Frequency Intermittent    Multiple Pain Sites Yes    Pain Score 6    Pain Location Head    Pain Orientation Right    Pain Descriptors / Indicators Aching    Pain Type Acute pain    Pain Onset Today    Pain Frequency Intermittent                               [facility] Adult PT Treatment/Exercise - 12/29/20 0001       Shoulder Exercises: Supine   Other Supine Exercises Supine wand flex, scaption x 5, stargazer x 5    Other Supine Exercises AROM shoulder flexion, scaption, horizontal abd x 5 ea      Manual Therapy   Soft tissue mobilization bilateral UT, right pectorals/lats, suboccipital release, and right  chest scar massage    Myofascial Release MFR to right chest incision area    Passive ROM PROM right shoulder flex, scaption, abd, ER                    PT Education - 12/29/20 1453     Education Details instructed in supine wand flex, scaption, stargazer    Person(s) Educated Patient    Methods Explanation;Demonstration    Comprehension Returned demonstration              PT Short Term Goals - 12/08/20 0748       PT SHORT TERM GOAL #1   Title Pt will be I with HEP for low back pain    Status Achieved      PT SHORT TERM GOAL #2   Title Pt will be able to acquire seat cushion for more comfort at rest, seated.    Status Achieved               PT Long Term Goals - 12/08/20 0815       PT LONG TERM GOAL #1   Title Pt will be able to report ability to sit for transportation with min pain overall to and from therapy.    Baseline 4/10 today     Status On-going      PT LONG TERM GOAL #2   Title Pt will be I with HEP for core and hips, trunk flexibility    Baseline up to date    Status On-going      PT LONG TERM GOAL #3   Title Pt will be able to properly lift and squat without increase in pain for work and childcare duties    Status Achieved      PT LONG TERM GOAL #4   Title Pt will be able to increase hip strength to 5/5 in al planes in order to support posture and balance    Status Unable to assess                   Plan - 12/29/20 1500     Clinical Impression Statement herapy consisted of soft tissue mobilization to bilateral UT, right pectorals and lats as well as incisional area, MFR to right chest area, PROM of right shoulder and AA,AROM exercises of the right shoulder.  Shoulder ROM with good improvement and pt especially liked stargazer stretch.  She felt much looser after treatment and her HA was better    Personal Factors and Comorbidities Comorbidity 1;Time since onset of injury/illness/exacerbation;Transportation    Comorbidities cancer    Examination-Activity Limitations Carry;Caring for Others;Reach Overhead;Lift    Examination-Participation Restrictions Community Activity;Occupation;Interpersonal Relationship;Cleaning;Laundry    Stability/Clinical Decision Making Stable/Uncomplicated    Rehab Potential Good    PT Frequency 2x / week    PT Duration 4 weeks    PT Treatment/Interventions ADLs/Self Care Home Management;Therapeutic exercise;Patient/family education;Manual techniques;Manual lymph drainage;Compression bandaging;Scar mobilization;Passive range of motion;Taping    PT Next Visit Plan cont STM, AAROM, AROM,MLD to lateral trunk and axilla,    Consulted and Agree with Plan of Care Patient             Patient will benefit from skilled therapeutic intervention in order to improve the following deficits and impairments:  Decreased range of motion, Decreased strength, Increased fascial  restricitons, Impaired flexibility, Postural dysfunction, Pain, Decreased scar mobility, Decreased knowledge of precautions, Increased edema  Visit Diagnosis: Abnormal posture  Aftercare following surgery for neoplasm  Localized edema  Lymphedema, not elsewhere classified  Muscle weakness (generalized)  Stiffness of right shoulder, not elsewhere classified     Problem List Patient Active Problem List   Diagnosis Date Noted   Port-A-Cath in place 05/11/2020   Genetic testing 03/04/2020   Malignant neoplasm of upper-outer quadrant of right breast in female, estrogen receptor positive ([place]) 02/10/2020   [redacted] weeks gestation of pregnancy 03/16/2019   Normal labor 03/15/2019    Claris Pong 12/29/2020, 5:28 PM  [place], [place], [address] Phone: [phone]   Fax:  [phone]  Name: Jill [name] MRN: [id number] Date of Birth: 1976-05-28 Cheral Almas, PT 12/29/20 5:30 PM

## 2020-12-29 NOTE — Patient Instructions (Signed)
SHOULDER: Flexion - Supine (Cane)        Cancer Rehab [id number]    Hold cane in both hands. Raise arms up overhead. Do not allow back to arch. Hold _5__ seconds. Do __5-10__ times; __1-2__ times a day. Hands shoulder width apart 2.hands wider apart;V position    Shoulder Blade Stretch    Clasp fingers behind head with elbows touching in front of face. Pull elbows back while pressing shoulder blades together. Relax and hold as tolerated, can place pillow under elbow here for comfort as needed and to allow for prolonged stretch.  Repeat __5__ times. Do __1-2__ sessions per day.     S

## 2020-12-30 ENCOUNTER — Other Ambulatory Visit: Payer: Self-pay

## 2020-12-30 DIAGNOSIS — N644 Mastodynia: Secondary | ICD-10-CM

## 2021-01-02 ENCOUNTER — Other Ambulatory Visit: Payer: Self-pay

## 2021-01-02 ENCOUNTER — Ambulatory Visit (HOSPITAL_COMMUNITY)
Admission: RE | Admit: 2021-01-02 | Discharge: 2021-01-02 | Disposition: A | Discharge status: Home / Self Care | Payer: Self-pay | Source: Ambulatory Visit | Attending: Nurse Practitioner | Admitting: Nurse Practitioner

## 2021-01-02 ENCOUNTER — Ambulatory Visit (HOSPITAL_BASED_OUTPATIENT_CLINIC_OR_DEPARTMENT_OTHER)
Admission: RE | Admit: 2021-01-02 | Discharge: 2021-01-02 | Disposition: A | Discharge status: Home / Self Care | Payer: Self-pay | Source: Ambulatory Visit | Attending: Internal Medicine | Admitting: Internal Medicine

## 2021-01-02 ENCOUNTER — Ambulatory Visit (HOSPITAL_COMMUNITY)
Admission: RE | Admit: 2021-01-02 | Discharge: 2021-01-02 | Disposition: A | Discharge status: Home / Self Care | Payer: Self-pay | Source: Ambulatory Visit | Attending: Internal Medicine | Admitting: Internal Medicine

## 2021-01-02 ENCOUNTER — Other Ambulatory Visit (HOSPITAL_COMMUNITY): Payer: Self-pay | Admitting: Internal Medicine

## 2021-01-02 VITALS — BP 112/84 | HR 73 | Ht <= 58 in | Wt 160.8 lb

## 2021-01-02 DIAGNOSIS — R002 Palpitations: Secondary | ICD-10-CM

## 2021-01-02 DIAGNOSIS — Z170 Estrogen receptor positive status [ER+]: Secondary | ICD-10-CM

## 2021-01-02 DIAGNOSIS — C50411 Malignant neoplasm of upper-outer quadrant of right female breast: Secondary | ICD-10-CM

## 2021-01-02 DIAGNOSIS — Z0189 Encounter for other specified special examinations: Secondary | ICD-10-CM

## 2021-01-02 DIAGNOSIS — I351 Nonrheumatic aortic (valve) insufficiency: Secondary | ICD-10-CM | POA: Insufficient documentation

## 2021-01-02 LAB — ECHOCARDIOGRAM COMPLETE
Area-P 1/2: 3.17 cm2
S' Lateral: 2.5 cm

## 2021-01-02 NOTE — Progress Notes (Signed)
Your provider has recommended that  you wear a Zio Patch for 7 days.  This monitor will record your heart rhythm for our review.  IF you have any symptoms while wearing the monitor please press the button.  If you have any issues with the patch or you notice a red or orange light on it please call the company at [phone].  Once you remove the patch please mail it back to the company as soon as possible so we can get the results.

## 2021-01-02 NOTE — Progress Notes (Signed)
  Echocardiogram 2D Echocardiogram has been performed.  Jill [name] 01/02/2021, 9:36 AM

## 2021-01-02 NOTE — Patient Instructions (Signed)
Su proveedor le ha recomendado que use un parche Zio durante 7 das. Este monitor registrar su ritmo cardaco para nuestra revisin. SI tiene algn sntoma mientras [place] el monitor, presione el botn. Si tiene algn problema con el parche o nota una luz roja o naranja, llame a la compaa al [phone]. Una vez que elimine el parche, envelo por correo a la empresa lo antes posible para que podamos [employer].  Su mdico le recomienda programar una cita de seguimiento en: 3 meses con un ecocardiograma  Si tiene alguna pregunta o inquietud antes de su prxima cita, envenos un mensaje a travs de mychart o llame a nuestra oficina al [phone].  PARA DEJAR UN MENSAJE PARA LA ENFERMERA SELECCIONE LA OPCIN 2, POR FAVOR DEJE UN MENSAJE QUE INCLUYA:  SU NOMBRE  FECHA DE NACIMIENTO  NMERO DE DEVOLUCIN DE LLAMADA  MOTIVO DE LA LLAMADA**esto es importante ya que damos prioridad a las devoluciones de llamadas  RECIBIR UNA LLAMADA EL MISMO DA SIEMPRE QUE LLAME ANTES DE LAS 4:00 PM

## 2021-01-02 NOTE — Progress Notes (Signed)
CARDIO-ONCOLOGY CLINIC NOTE  Referring Physician: Dr. Sumie Cheadle Primary Care: Gildardo Pounds, NP  HPI:  Jill [name] is 45 y.o. female from [place] with right breast cancer referred by Dr. Jana Hakim for enrollment into the Cardio-Oncology program.  Oncology history:  (1) status post right mastectomy and sentinel lymph node sampling 03/07/2020 for a pT2 pN1, stage IB invasive ductal carcinoma, grade 2, with negative margins.             (a) a total of 4 lymph nodes were removed, one positive   (2) adjuvant chemo immunotherapy will consist of carboplatin, docetaxel, trastuzumab and pertuzumab every 21 days x 6 starting 03/29/2020, completed 07/18/2020             (a) pertuzumab discontinued after cycle 2 secondary to diarrhea   (3) trastuzumab continuing to complete a year (through October 2022)   (4) XRT finished 10/03/20   (5) tamoxifen 5-10 years   She is here with [place] interpreter. Doing well. Tolerating Herceptin well. Last dose 12/29/2020. Denies SOB, orthopnea or PND.  Finishes chemotherapy in September  ECHO today 01/02/21 EF 60-65% GLS -17.0  ECHO  09/28/20: EF 55-60% Personally reviewed ECHO 9/21: EF 60-65% ECHO 06/30/19 EF 65-70% Grade I DD. Reviewed personally.    Past Medical History:  Diagnosis Date   Breast cancer ([place]) 03/07/2020   Infected cyst of Bartholin's gland duct    Medical history non-contributory     Current Outpatient Medications  Medication Sig Dispense Refill   gabapentin (NEURONTIN) 300 MG capsule Take 1 capsule (300 mg total) by mouth at bedtime as needed (burning pain). Instructions in spanish 90 capsule 1   ibuprofen (ADVIL) 200 MG tablet Take 400 mg by mouth every 6 (six) hours as needed for headache or moderate pain.     tamoxifen (NOLVADEX) 20 MG tablet Take 1 tablet (20 mg total) by mouth daily. 90 tablet 12   No current facility-administered medications for this encounter.    No Known Allergies    Social History   Socioeconomic  History   Marital status: Single    Spouse name: Not on file   Number of children: 4   Years of education: Not on file   Highest education level: 6th grade  Occupational History   Not on file  Tobacco Use   Smoking status: Never   Smokeless tobacco: Never  Vaping Use   Vaping Use: Never used  Substance and Sexual Activity   Alcohol use: Never   Drug use: Never   Sexual activity: Not Currently    Birth control/protection: I.U.D.  Other Topics Concern   Not on file  Social History Narrative   Not on file   Social Determinants of Health   Financial Resource Strain: Not on file  Food Insecurity: Not on file  Transportation Needs: No Transportation Needs   Lack of Transportation (Medical): No   Lack of Transportation (Non-Medical): No  Physical Activity: Not on file  Stress: Not on file  Social Connections: Not on file  Intimate Partner Violence: Not At Risk   Fear of Current or Ex-Partner: No   Emotionally Abused: No   Physically Abused: No   Sexually Abused: No      Family History  Problem Relation Age of Onset   Diabetes Maternal Grandmother    Hypertension Mother     Vitals:   01/02/21 1031  BP: 112/84  Pulse: 73  SpO2: 98%  Weight: 72.9 kg (160 lb 12.8 oz)  Height: 4'  10" (1.473 m)    PHYSICAL EXAM: General:  Well appearing. No resp difficulty HEENT: normal Neck: supple. no JVD. + LIJ port Carotids 2+ bilat; no bruits. No lymphadenopathy or thryomegaly appreciated. Cor: PMI nondisplaced. Regular rate & rhythm. No rubs, gallops or murmurs. + radiation burn  Lungs: clear Abdomen: soft, nontender, nondistended. No hepatosplenomegaly. No bruits or masses. Good bowel sounds. Extremities: no cyanosis, clubbing, rash, edema Neuro: alert & orientedx3, cranial nerves grossly intact. moves all 4 extremities w/o difficulty. Affect pleasant  ASSESSMENT & PLAN:  1. R Breast Cancer - ECHO 9/21: EF 60-65% GLS -17.0  - ECHO 06/30/19 EF 65-70% Grade I DD.  - ECHO   09/28/20: EF 55-60%  - ECHO  today 01/02/21 EF 60-65% - I reviewed echos personally. EF and Doppler parameters stable. No HF on exam. Continue Herceptin.  - FU with ECHO October   2. Palpitations -wakes up in the middle of the night heart racing, with palpitations -place 7 day zio monitor   Katherine Roan, MD  10:49 AM  Patient seen and examined with the above-signed Advanced Practice Provider and/or Housestaff. I personally reviewed laboratory data, imaging studies and relevant notes. I independently examined the patient and formulated the important aspects of the plan. I have edited the note to reflect any of my changes or salient points. I have personally discussed the plan with the patient and/or family.  Doing well. Tolerating Herceptin without issue. Having nocturnal palpitations. No syncope or presyncope.   General:  Well appearing. No resp difficulty HEENT: normal Neck: supple. no JVD. Carotids 2+ bilat; no bruits. No lymphadenopathy or thryomegaly appreciated. + port-a-cath Cor: PMI nondisplaced. Regular rate & rhythm. No rubs, gallops or murmurs. Lungs: clear Abdomen: soft, nontender, nondistended. No hepatosplenomegaly. No bruits or masses. Good bowel sounds. Extremities: no cyanosis, clubbing, rash, edema Neuro: alert & orientedx3, cranial nerves grossly intact. moves all 4 extremities w/o difficulty. Affect pleasant  Tolerating Herceptin well. I reviewed echos personally. EF and Doppler parameters stable. No HF on exam. Continue Herceptin. Place Zio to further evaluate palpitations. May have component of anxiety.   Glori Bickers, MD  11:38 PM

## 2021-01-03 ENCOUNTER — Ambulatory Visit: Payer: No Typology Code available for payment source

## 2021-01-03 DIAGNOSIS — M6281 Muscle weakness (generalized): Secondary | ICD-10-CM

## 2021-01-03 DIAGNOSIS — I890 Lymphedema, not elsewhere classified: Secondary | ICD-10-CM

## 2021-01-03 DIAGNOSIS — R600 Localized edema: Secondary | ICD-10-CM

## 2021-01-03 DIAGNOSIS — M25611 Stiffness of right shoulder, not elsewhere classified: Secondary | ICD-10-CM

## 2021-01-03 DIAGNOSIS — Z483 Aftercare following surgery for neoplasm: Secondary | ICD-10-CM

## 2021-01-03 DIAGNOSIS — R293 Abnormal posture: Secondary | ICD-10-CM

## 2021-01-03 NOTE — Therapy (Signed)
[place], [place], [address] Phone: [phone]   Fax:  [phone]  Physical Therapy Treatment  Patient Details  Name: Jill [name] MRN: [id number] Date of Birth: 06/14/1976 Referring Provider (PT): Magrinat   Encounter Date: 01/03/2021   PT End of Session - 01/03/21 1555     Visit Number 3    Number of Visits 9   for cancer rehab   Date for PT Re-Evaluation 01/31/21    Authorization Type self pay, CAFA    PT Start Time 1459    PT Stop Time 1553    PT Time Calculation (min) 54 min    Activity Tolerance Patient tolerated treatment well    Behavior During Therapy [facility] for tasks assessed/performed             Past Medical History:  Diagnosis Date   Breast cancer ([place]) 03/07/2020   Infected cyst of Bartholin's gland duct    Medical history non-contributory     Past Surgical History:  Procedure Laterality Date   IR IMAGING GUIDED PORT INSERTION  03/02/2020   MASTECTOMY W/ SENTINEL NODE BIOPSY Right 03/07/2020   Procedure: RIGHT MASTECTOMY WITH SENTINEL LYMPH NODE BIOPSY;  Surgeon: Jovita Kussmaul, MD;  Location: [place];  Service: General;  Laterality: Right;   NO PAST SURGERIES     WISDOM TOOTH EXTRACTION      There were no vitals filed for this visit.   Subjective Assessment - 01/03/21 1457     Subjective Feeling much better with improved ROM throughout.  Doesn't think she is swollen much. Back ache and HA much better after last visit as well    Patient is accompained by: Interpreter   Juliann Pulse   Pertinent History Breast CA 03/07/20, with chemo and radiation, mastecomy with SLNB (1/3)  03/07/20    How long can you sit comfortably? < 10 min    Diagnostic tests Grade 1 anterolisthesis of L5 on S1.Disc desiccation. Bilateral facet hypertrophy/arthropathy.Chronic bilateral sacroiliitis    Currently in Pain? Yes    Pain Score 3     Pain Location Neck    Pain  Orientation Right;Left    Pain Descriptors / Indicators Aching    Pain Onset More than a month ago    Pain Frequency Intermittent    Multiple Pain Sites No                [facility] PT Assessment - 01/03/21 0001       Assessment   Medical Diagnosis R breast cancer    Referring Provider (PT) Magrinat    Onset Date/Surgical Date 03/07/20    Hand Dominance Right    Prior Therapy currently receiving therapy for back and coccyx pain      Precautions   Precaution Comments at risk for lymphedema      Prior Function   Level of Independence Independent                           [facility] Adult PT Treatment/Exercise - 01/03/21 0001       Shoulder Exercises: Supine   Other Supine Exercises AROM shoulder flexion, scaption, horizontal abd x 5 ea      Shoulder Exercises: Pulleys   Flexion 1 minute    Flexion Limitations cues not to compensate with UT    Scaption 1 minute    Scaption Limitations cueing required    ABduction 1  minute      Shoulder Exercises: Therapy Ball   Flexion 10 reps    ABduction 5 reps      Manual Therapy   Soft tissue mobilization , right pectorals/lats,serratus supine,  right chest scar massage    Myofascial Release MFR to right chest incision area    Manual Lymphatic Drainage (MLD) Short neck, right and left axillary and right inguinal LN, right axillo inguinal pathway and posterior interaxillary pathway ending with LN's to decrease right trunk swelling    Passive ROM PROM right shoulder flex, scaption, abd, ER. VC's to relax shoulder                      PT Short Term Goals - 12/08/20 0748       PT SHORT TERM GOAL #1   Title Pt will be I with HEP for low back pain    Status Achieved      PT SHORT TERM GOAL #2   Title Pt will be able to acquire seat cushion for more comfort at rest, seated.    Status Achieved               PT Long Term Goals - 01/03/21 1601       PT LONG TERM GOAL #5   Title Pt will demonstrate  165 degrees of R shoulder flexion to allow her to reach overhead.    Baseline 145    Period Weeks    Status On-going    Target Date 01/31/21      PT LONG TERM GOAL #6   Title Pt will demonstrate 165 degrees of R shoulder abduction to allow her to reach out to the side.    Baseline 153    Time 4    Status On-going    Target Date 01/31/21      PT LONG TERM GOAL #7   Title Pt will be independent in a home exercise program for continued stretching and strengthening.    Time 4    Period Weeks    Status Achieved      PT LONG TERM GOAL #8   Title Pt will report a 50% improvement in pain across R chest to allow improved comfort.    Time 4    Status On-going                   Plan - 01/03/21 1558     Clinical Impression Statement Therapy consisted of Soft tissue mobilization, scar massage, MFR techniques to chest and MLD to swelling at right lateral trunk as well as PROM to right shoulder.  Pt also performed AAROM exs for ROM.  Pt noted good improvement in pain since last visit.  She is compliant with HEP.  She was given a form by Dr. Jana Hakim to purchase a bra but she did not understand it and was advised to bring it with her next visit. She does still have visible swelling at the right lateral trunk.   Personal Factors and Comorbidities Comorbidity 1;Time since onset of injury/illness/exacerbation;Transportation    Comorbidities cancer    Examination-Activity Limitations Carry;Caring for Others;Reach Overhead;Lift    Examination-Participation Restrictions Community Activity;Occupation;Interpersonal Relationship;Cleaning;Laundry    Stability/Clinical Decision Making Stable/Uncomplicated    Rehab Potential Good    PT Frequency 2x / week    PT Duration 4 weeks    PT Treatment/Interventions ADLs/Self Care Home Management;Therapeutic exercise;Patient/family education;Manual techniques;Manual lymph drainage;Compression bandaging;Scar mobilization;Passive range of motion;Taping    PT  Next Visit Plan cont STM, AAROM, AROM,MLD to lateral trunk and axilla,check pt paperwork from Dr. Jana Hakim for bra    Consulted and Agree with Plan of Care Patient             Patient will benefit from skilled therapeutic intervention in order to improve the following deficits and impairments:  Decreased range of motion, Decreased strength, Increased fascial restricitons, Impaired flexibility, Postural dysfunction, Pain, Decreased scar mobility, Decreased knowledge of precautions, Increased edema  Visit Diagnosis: Abnormal posture  Aftercare following surgery for neoplasm  Localized edema  Lymphedema, not elsewhere classified  Muscle weakness (generalized)  Stiffness of right shoulder, not elsewhere classified     Problem List Patient Active Problem List   Diagnosis Date Noted   Port-A-Cath in place 05/11/2020   Genetic testing 03/04/2020   Malignant neoplasm of upper-outer quadrant of right breast in female, estrogen receptor positive ([place]) 02/10/2020   [redacted] weeks gestation of pregnancy 03/16/2019   Normal labor 03/15/2019    Claris Pong 01/03/2021, 6:58 PM  [place], [place], [address] Phone: [phone]   Fax:  [phone]  Name: Jill [name] MRN: [id number] Date of Birth: 1975-12-27 Cheral Almas, PT 01/03/21 7:00 PM

## 2021-01-05 ENCOUNTER — Encounter: Payer: Self-pay | Admitting: Physical Therapy

## 2021-01-05 ENCOUNTER — Ambulatory Visit: Payer: No Typology Code available for payment source | Admitting: Physical Therapy

## 2021-01-05 ENCOUNTER — Other Ambulatory Visit: Payer: Self-pay

## 2021-01-05 DIAGNOSIS — M25611 Stiffness of right shoulder, not elsewhere classified: Secondary | ICD-10-CM

## 2021-01-05 DIAGNOSIS — Z483 Aftercare following surgery for neoplasm: Secondary | ICD-10-CM

## 2021-01-05 DIAGNOSIS — I890 Lymphedema, not elsewhere classified: Secondary | ICD-10-CM

## 2021-01-05 DIAGNOSIS — R293 Abnormal posture: Secondary | ICD-10-CM

## 2021-01-05 NOTE — Therapy (Signed)
[place], [place], [address] Phone: [phone]   Fax:  [phone]  Physical Therapy Treatment  Patient Details  Name: Jill [name] MRN: [id number] Date of Birth: 1976/02/21 Referring Provider (PT): Magrinat   Encounter Date: 01/05/2021   PT End of Session - 01/05/21 1557     Visit Number 4    Number of Visits 9   for cancer rehab   Authorization Type self pay, CAFA    Authorization Time Period 10/22    PT Start Time 1503   20 minutes not billable due to assisting pt with making appt with [facility] for mastectomy bra with help from interpreter   PT Stop Time 1554    PT Time Calculation (min) 51 min    Activity Tolerance Patient tolerated treatment well    Behavior During Therapy [facility] for tasks assessed/performed             Past Medical History:  Diagnosis Date   Breast cancer ([place]) 03/07/2020   Infected cyst of Bartholin's gland duct    Medical history non-contributory     Past Surgical History:  Procedure Laterality Date   IR IMAGING GUIDED PORT INSERTION  03/02/2020   MASTECTOMY W/ SENTINEL NODE BIOPSY Right 03/07/2020   Procedure: RIGHT MASTECTOMY WITH SENTINEL LYMPH NODE BIOPSY;  Surgeon: Jovita Kussmaul, MD;  Location: [facility];  Service: General;  Laterality: Right;   NO PAST SURGERIES     WISDOM TOOTH EXTRACTION      There were no vitals filed for this visit.   Subjective Assessment - 01/05/21 1504     Subjective My shoulder is feeling much better. I am having swelling still on the side.    Patient is accompained by: Interpreter    Pertinent History Breast CA 03/07/20, with chemo and radiation, mastecomy with SLNB (1/3)  03/07/20    Patient Stated Goals Patient would like to get exercises to help her feel better, to feel well, be able to raise arm all the way    Currently in Pain? No/denies    Pain Score 0-No pain                                [facility] Adult PT Treatment/Exercise - 01/05/21 0001       Manual Therapy   Manual Therapy Edema management    Edema Management created foam chip pack for pt to wear in her bra to help decrease axillary swelling    Soft tissue mobilization to right pectorals and serratus anterior    Manual Lymphatic Drainage (MLD) Short neck,5 diaphragmatic breaths, right inguinal LN, right axillo inguinal pathway, R axilla moving fluid towards pathway    Passive ROM PROM right shoulder flex and abduction while performing STM to R pec                      PT Short Term Goals - 12/08/20 0748       PT SHORT TERM GOAL #1   Title Pt will be I with HEP for low back pain    Status Achieved      PT SHORT TERM GOAL #2   Title Pt will be able to acquire seat cushion for more comfort at rest, seated.    Status Achieved               PT  Long Term Goals - 01/03/21 1601       PT LONG TERM GOAL #5   Title Pt will demonstrate 165 degrees of R shoulder flexion to allow her to reach overhead.    Baseline 145    Period Weeks    Status On-going    Target Date 01/31/21      PT LONG TERM GOAL #6   Title Pt will demonstrate 165 degrees of R shoulder abduction to allow her to reach out to the side.    Baseline 153    Time 4    Status On-going    Target Date 01/31/21      PT LONG TERM GOAL #7   Title Pt will be independent in a home exercise program for continued stretching and strengthening.    Time 4    Period Weeks    Status Achieved      PT LONG TERM GOAL #8   Title Pt will report a 50% improvement in pain across R chest to allow improved comfort.    Time 4    Status On-going                   Plan - 01/05/21 1601     Clinical Impression Statement First half of session was spent assisting pt with making an appointment for a mastectomy bra with interpreter present to assist. Then continued to focus on manual therapy to serratus  anterior and R pec to help decrease muscle tightness. Pt reports that she feels better after every session. Muscle tightness improved greatly with manual therapy today. Continued with MLD with focus on axilla. Created a foam chip pack for pt to wear in side of bra to provide compression to R axilla and lateral trunk to reduce edema.    PT Frequency 2x / week    PT Duration 4 weeks    PT Treatment/Interventions ADLs/Self Care Home Management;Therapeutic exercise;Patient/family education;Manual techniques;Manual lymph drainage;Compression bandaging;Scar mobilization;Passive range of motion;Taping    PT Next Visit Plan cont STM, AAROM, AROM,MLD to lateral trunk and axilla,check pt paperwork from Dr. Jana Hakim for bra    Consulted and Agree with Plan of Care Patient             Patient will benefit from skilled therapeutic intervention in order to improve the following deficits and impairments:  Decreased range of motion, Decreased strength, Increased fascial restricitons, Impaired flexibility, Postural dysfunction, Pain, Decreased scar mobility, Decreased knowledge of precautions, Increased edema  Visit Diagnosis: Lymphedema, not elsewhere classified  Aftercare following surgery for neoplasm  Abnormal posture  Stiffness of right shoulder, not elsewhere classified     Problem List Patient Active Problem List   Diagnosis Date Noted   Port-A-Cath in place 05/11/2020   Genetic testing 03/04/2020   Malignant neoplasm of upper-outer quadrant of right breast in female, estrogen receptor positive ([place]) 02/10/2020   [redacted] weeks gestation of pregnancy 03/16/2019   Normal labor 03/15/2019    [place] 01/05/2021, 4:10 PM  [place], [place], [address] Phone: [phone]   Fax:  [phone]  Name: Janeva Peaster MRN: [id number] Date of Birth: July 30, 1975  Manus Gunning,  PT 01/05/21 4:10 PM

## 2021-01-10 ENCOUNTER — Encounter: Payer: Self-pay | Admitting: Physical Therapy

## 2021-01-10 ENCOUNTER — Ambulatory Visit
Admission: RE | Admit: 2021-01-10 | Discharge: 2021-01-10 | Disposition: A | Discharge status: Home / Self Care | Payer: No Typology Code available for payment source | Source: Ambulatory Visit | Attending: Obstetrics and Gynecology | Admitting: Obstetrics and Gynecology

## 2021-01-10 ENCOUNTER — Other Ambulatory Visit: Payer: Self-pay

## 2021-01-10 ENCOUNTER — Ambulatory Visit: Payer: Self-pay | Admitting: *Deleted

## 2021-01-10 ENCOUNTER — Other Ambulatory Visit: Payer: Self-pay | Admitting: Obstetrics and Gynecology

## 2021-01-10 ENCOUNTER — Ambulatory Visit: Payer: No Typology Code available for payment source | Admitting: Physical Therapy

## 2021-01-10 ENCOUNTER — Ambulatory Visit: Payer: Self-pay

## 2021-01-10 ENCOUNTER — Encounter: Payer: Self-pay | Admitting: Oncology

## 2021-01-10 VITALS — BP 118/72 | Wt 158.2 lb

## 2021-01-10 DIAGNOSIS — N644 Mastodynia: Secondary | ICD-10-CM

## 2021-01-10 DIAGNOSIS — Z1239 Encounter for other screening for malignant neoplasm of breast: Secondary | ICD-10-CM

## 2021-01-10 DIAGNOSIS — I890 Lymphedema, not elsewhere classified: Secondary | ICD-10-CM

## 2021-01-10 DIAGNOSIS — Z483 Aftercare following surgery for neoplasm: Secondary | ICD-10-CM

## 2021-01-10 IMAGING — MG MM DIGITAL DIAGNOSTIC UNILAT*L* W/ TOMO W/ CAD
4 series · 4 of 12 positions shown · non-contrast
Comparison: Previous exam(s).

CLINICAL DATA: 44-year-old female with diffuse pain in the UPPER
and OUTER LEFT breast, noticed after Port-A-Cath placement in
[REDACTED]. History of RIGHT breast cancer and RIGHT mastectomy.

EXAM:
DIGITAL DIAGNOSTIC UNILATERAL LEFT MAMMOGRAM WITH TOMOSYNTHESIS AND
CAD; ULTRASOUND LEFT BREAST LIMITED
TECHNIQUE: Left digital diagnostic mammography and breast tomosynthesis was
performed. The images were evaluated with computer-aided detection.;
Targeted ultrasound examination of the left breast was performed

[L CC synth-2D]
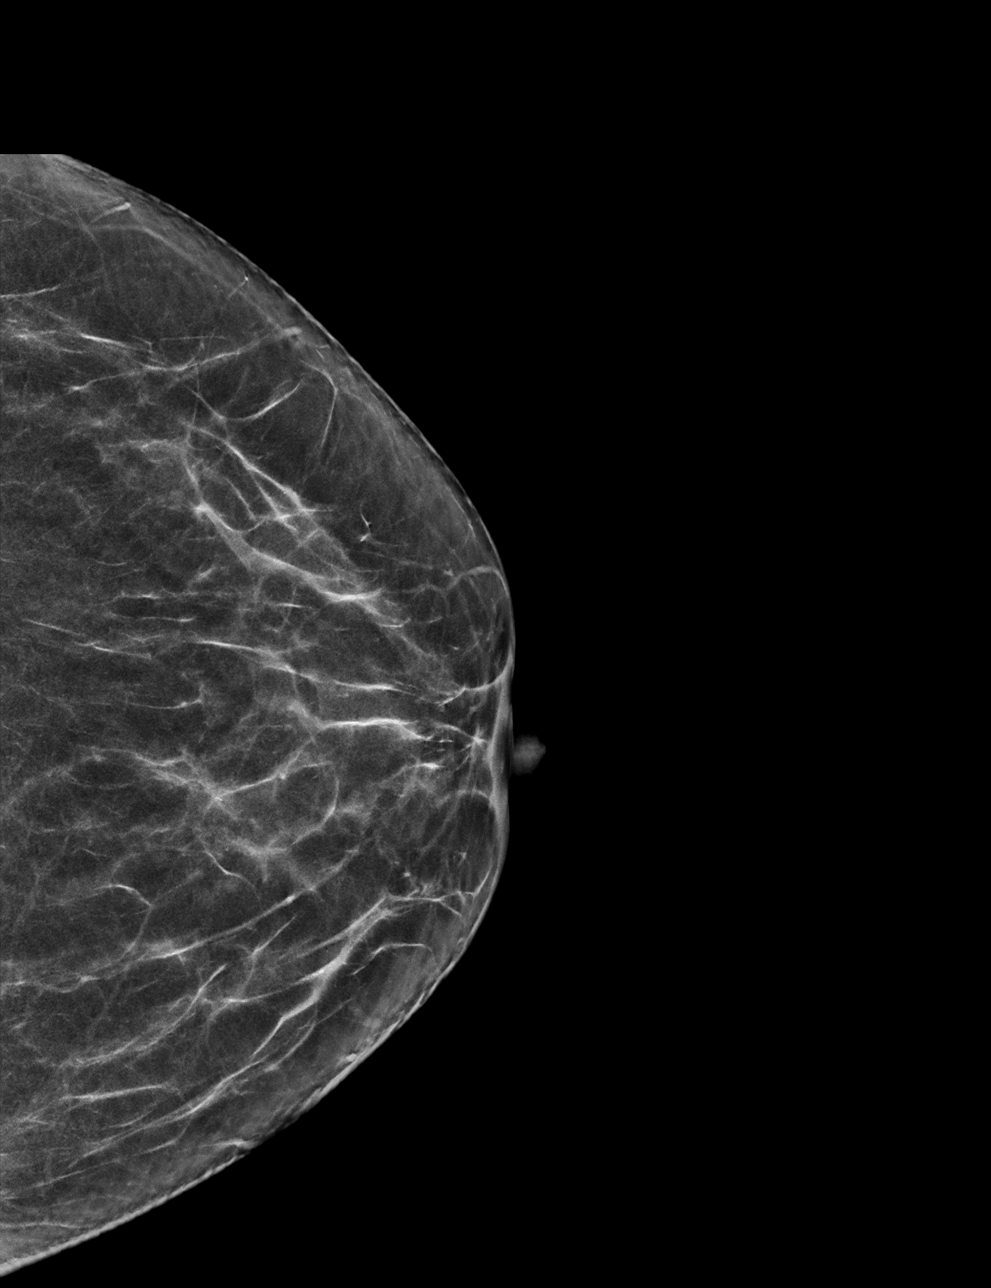

[L MLO synth-2D]
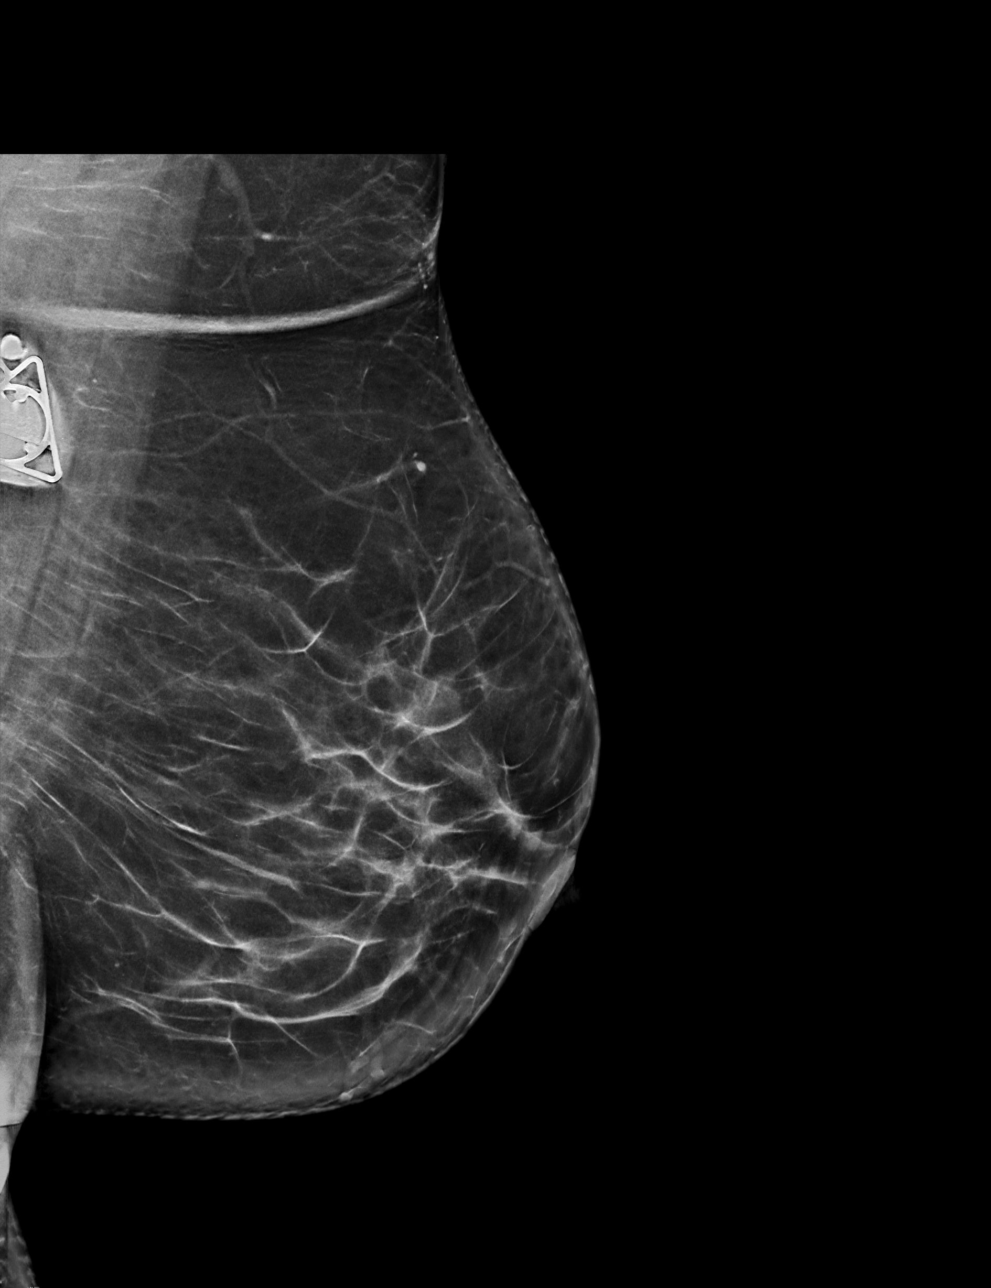

[L CC tomo · tomo slice 29/57.0]
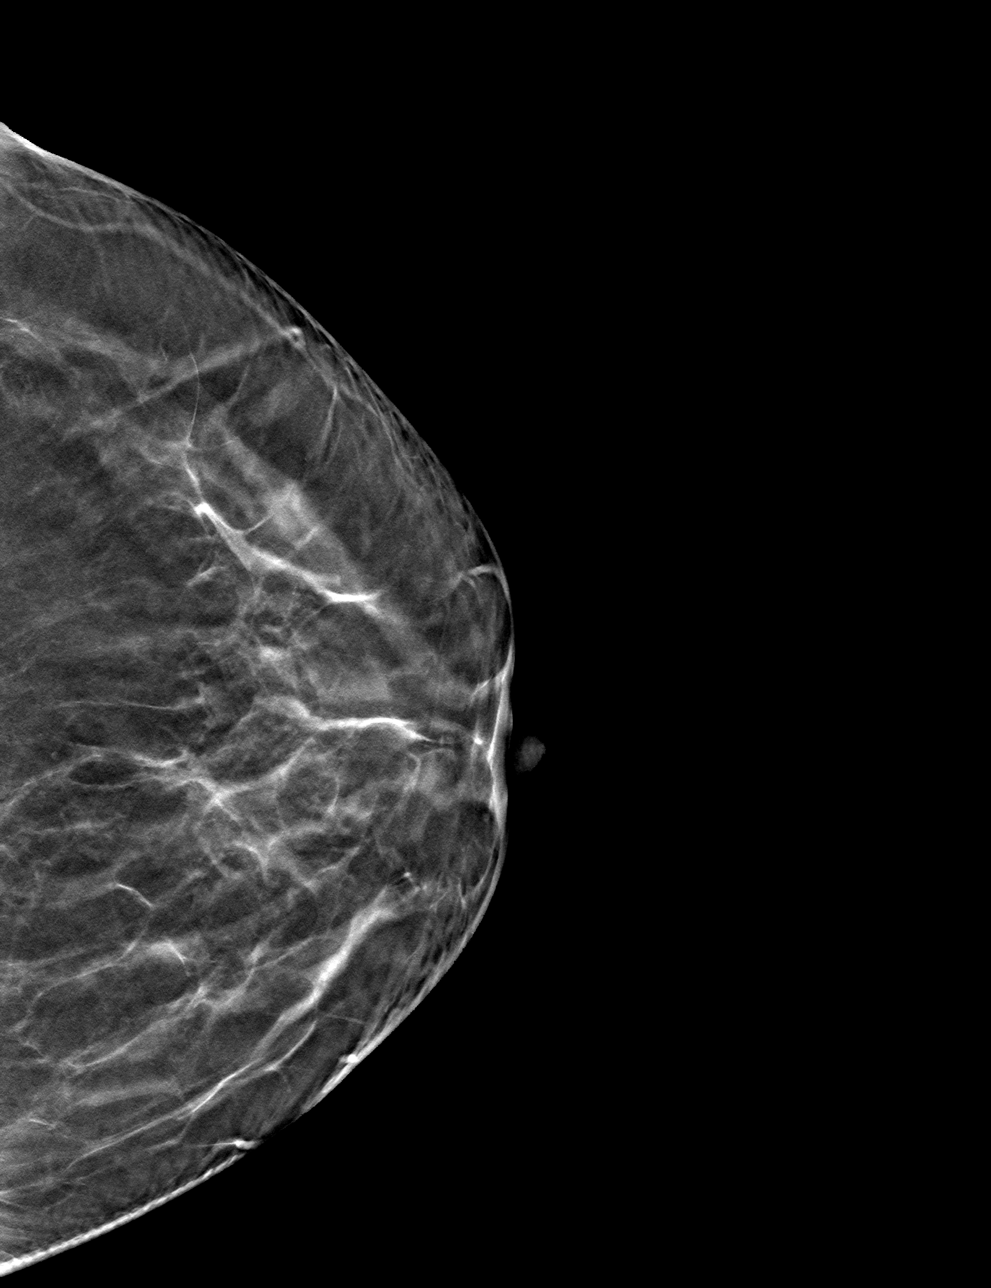

[L MLO tomo · tomo slice 36/71.0]
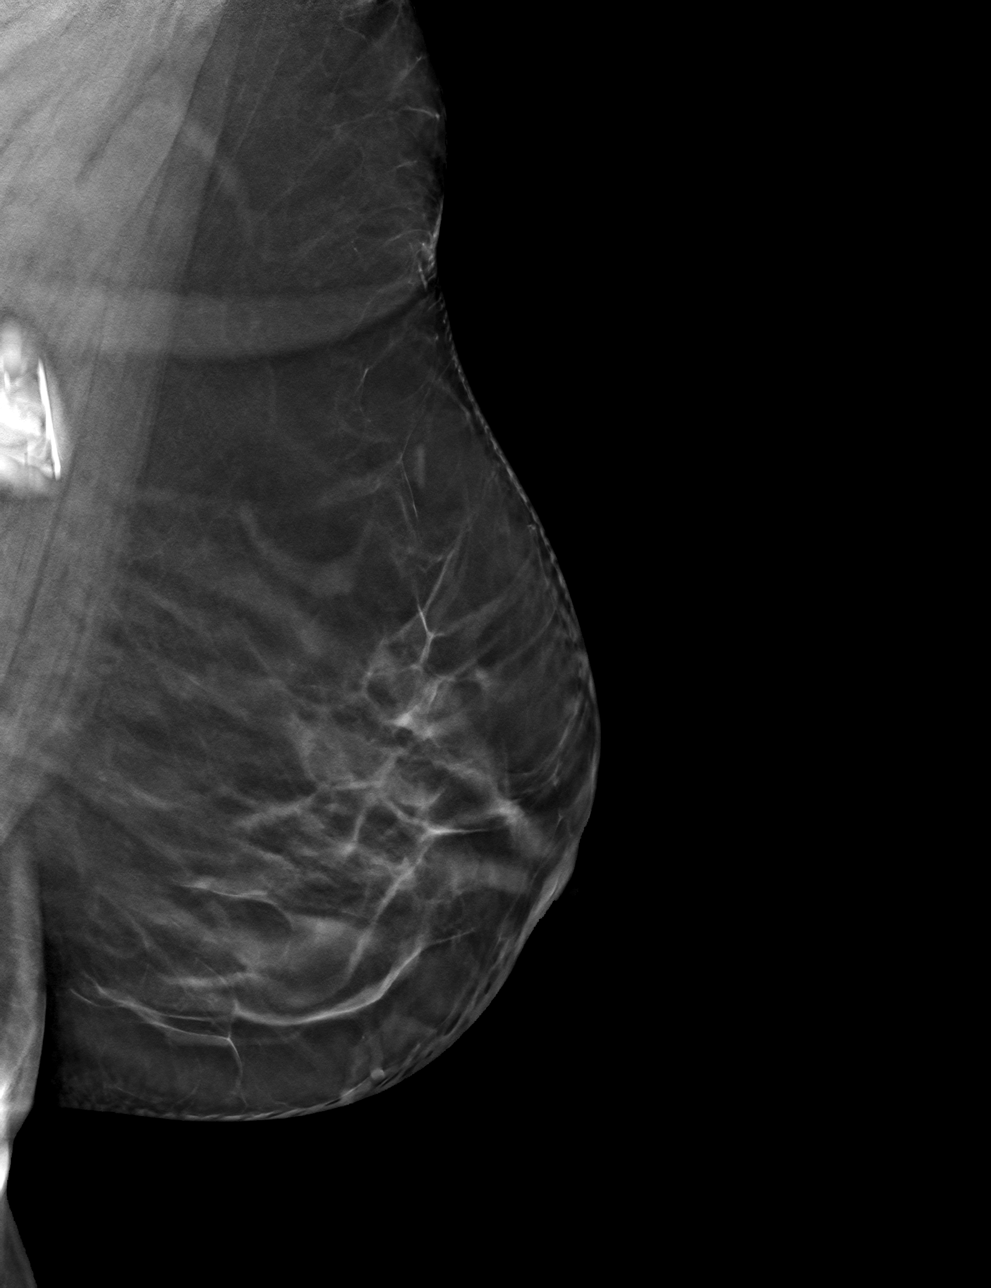

[4 of 12 positions shown; findings below may reference images not displayed]

ACR Breast Density Category b: There are scattered areas of
fibroglandular density.
FINDINGS: Full field views of the LEFT breast demonstrate no suspicious mass,
distortion or worrisome calcifications. Portion of the patient's
port is visualized.

Targeted ultrasound is performed, showing no sonographic
abnormalities within the UPPER-OUTER LEFT breast except for the
patient's Port-A-Cath. No abnormal fluid adjacent to the Port-A-Cath
is noted.
IMPRESSION: 1. No findings to suggest a cause for this patient's diffuse LEFT
breast pain.
2. No mammographic evidence of breast malignancy.
3. LEFT Port-A-Cath without adjacent collection/fluid.

RECOMMENDATION:
LEFT screening mammogram in 1 year.

I have discussed the findings and recommendations with the patient.
If applicable, a reminder letter will be sent to the patient
regarding the next appointment.

BI-RADS CATEGORY  1: Negative.

## 2021-01-10 IMAGING — US US BREAST*L* LIMITED INC AXILLA
1 series · 5 of 5 positions shown · non-contrast
Comparison: Previous exam(s).

CLINICAL DATA: 44-year-old female with diffuse pain in the UPPER
and OUTER LEFT breast, noticed after Port-A-Cath placement in
[REDACTED]. History of RIGHT breast cancer and RIGHT mastectomy.

EXAM:
DIGITAL DIAGNOSTIC UNILATERAL LEFT MAMMOGRAM WITH TOMOSYNTHESIS AND
CAD; ULTRASOUND LEFT BREAST LIMITED
TECHNIQUE: Left digital diagnostic mammography and breast tomosynthesis was
performed. The images were evaluated with computer-aided detection.;
Targeted ultrasound examination of the left breast was performed

[Series 1: us breast*left* limited inc axilla · 0.06mm/px · 5 of 5 slices shown]
[im 1/5]
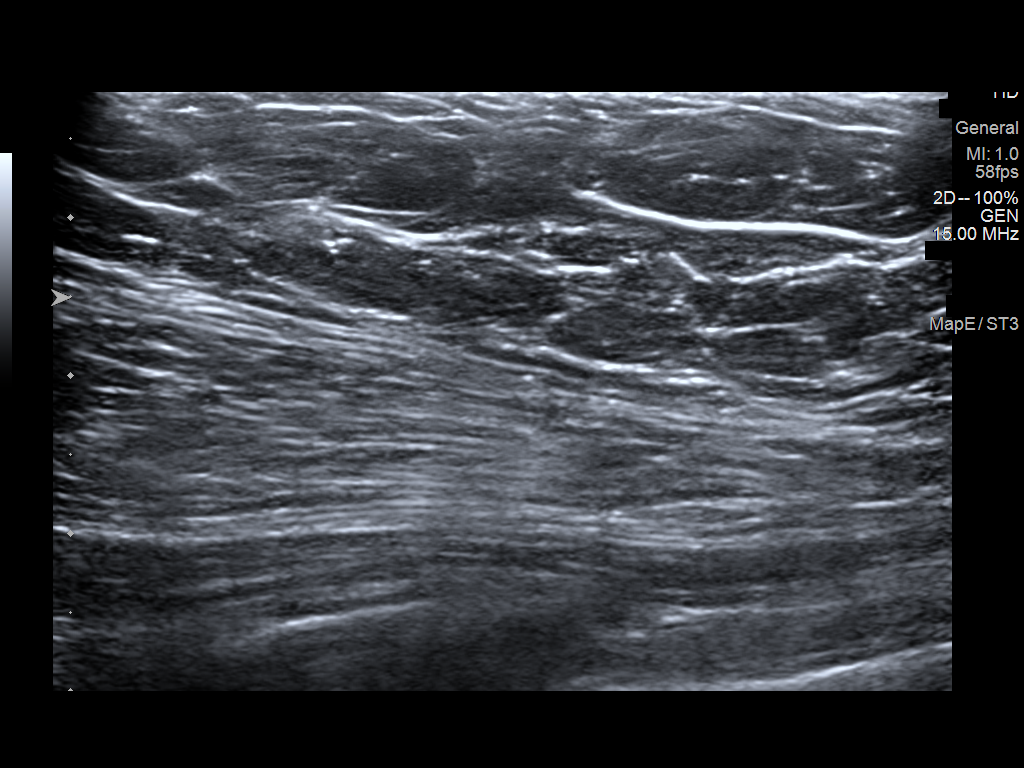
[im 2/5]
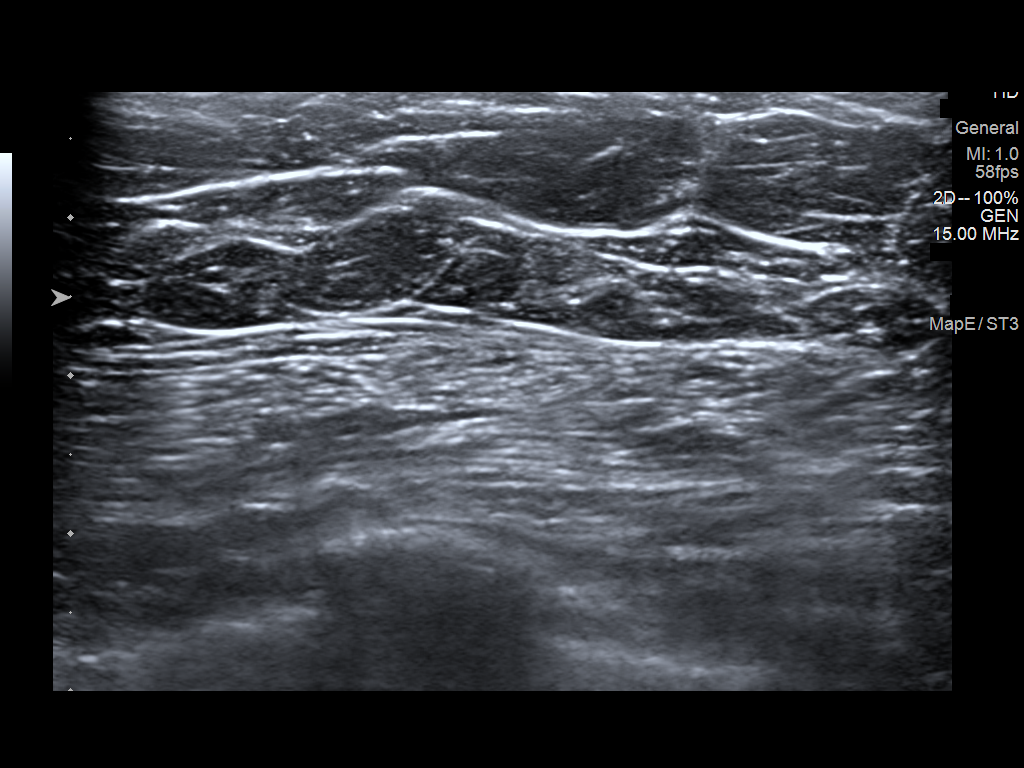
[im 3/5]
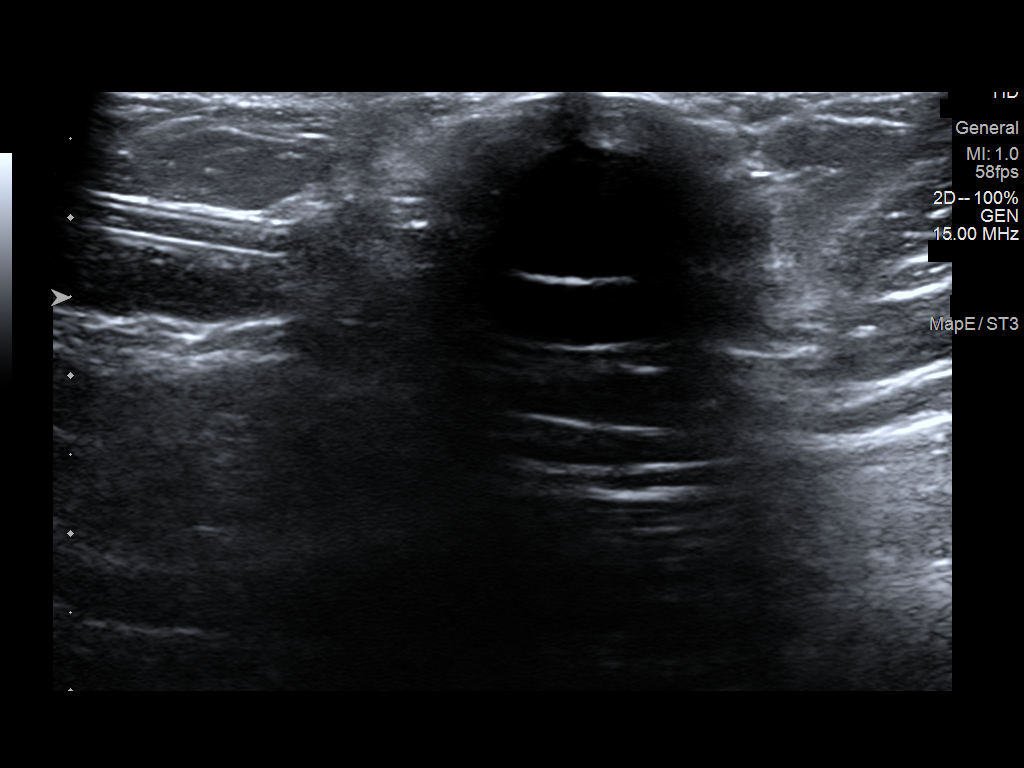
[im 4/5]
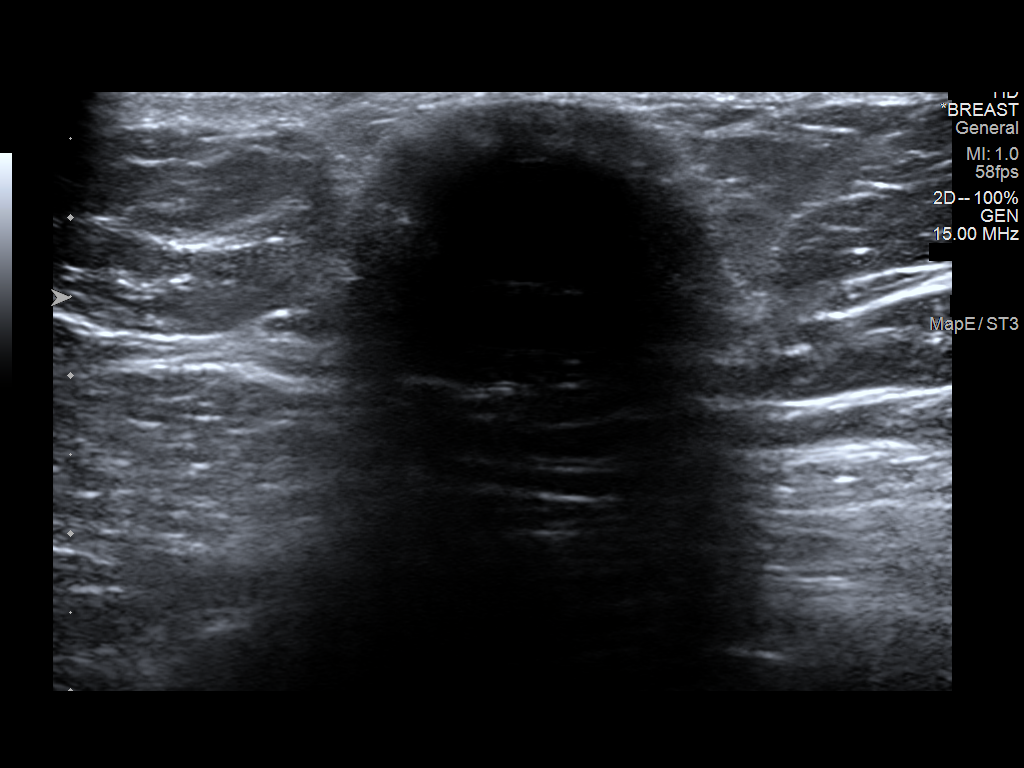
[im 5/5]
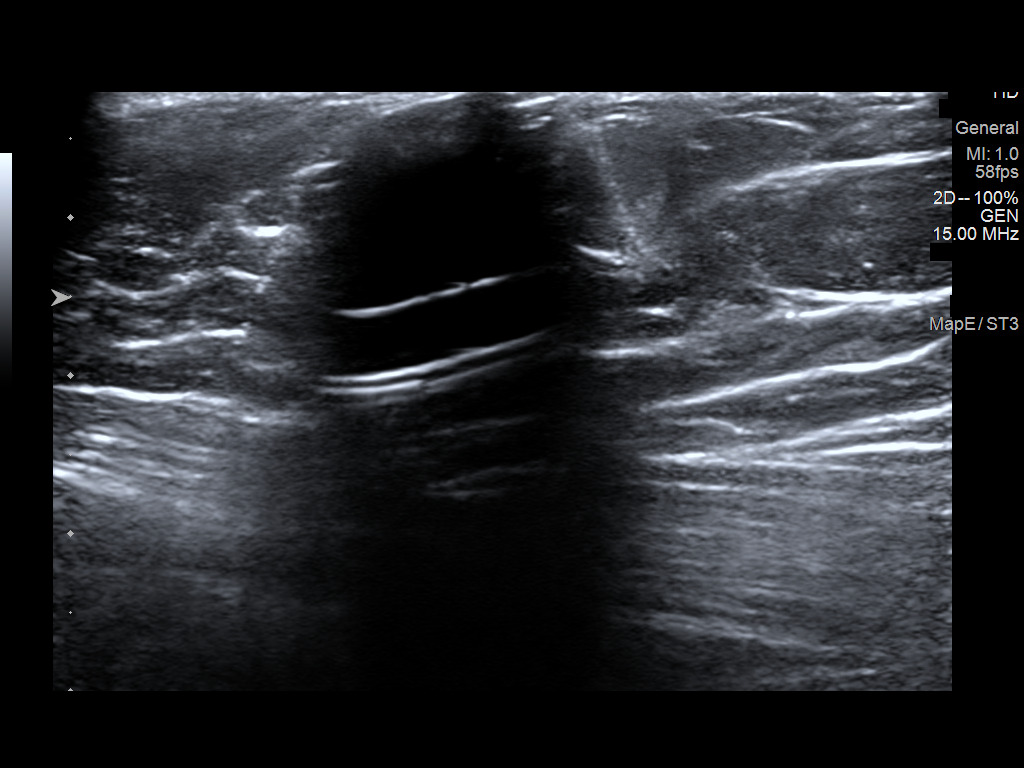

[5 of 5 positions shown; findings below may reference images not displayed]

ACR Breast Density Category b: There are scattered areas of
fibroglandular density.
FINDINGS: Full field views of the LEFT breast demonstrate no suspicious mass,
distortion or worrisome calcifications. Portion of the patient's
port is visualized.

Targeted ultrasound is performed, showing no sonographic
abnormalities within the UPPER-OUTER LEFT breast except for the
patient's Port-A-Cath. No abnormal fluid adjacent to the Port-A-Cath
is noted.
IMPRESSION: 1. No findings to suggest a cause for this patient's diffuse LEFT
breast pain.
2. No mammographic evidence of breast malignancy.
3. LEFT Port-A-Cath without adjacent collection/fluid.

RECOMMENDATION:
LEFT screening mammogram in 1 year.

I have discussed the findings and recommendations with the patient.
If applicable, a reminder letter will be sent to the patient
regarding the next appointment.

BI-RADS CATEGORY  1: Negative.

## 2021-01-10 NOTE — Patient Instructions (Signed)
Explained breast self awareness with [facility]. Patient did not need a Pap smear today due to last Pap smear and HPV typing was 3*24/2022. Let her know BCCCP will cover Pap smears and HPV typing every 5 years unless has a history of abnormal Pap smears. Referred patient to the [facility] for a left breast diagnostic mammogram. Appointment scheduled Tuesday, January 10, 2021 at 1030. Patient aware of appointment and will be there. [place] verbalized understanding.  Maziah Smola, Arvil Chaco, RN 8:30 AM

## 2021-01-10 NOTE — Progress Notes (Signed)
[name] is a 45 y.o. female who presents to [facility] clinic today with complaint of left outer breast pain since April 2022 that comes and goes. Patient rates the pain at a 3-4 out of 10.    Pap Smear: Pap smear not completed today. Last Pap smear was 09/15/2020 at the [facility] clinic and was normal with negative HPV. Per patient has no history of an abnormal Pap smear. Last Pap smear result is available in Epic.   Physical exam: Breasts Left breast larger than right breast due to patient has a history of a right breast mastectomy for breast cancer 03/07/2020. No skin abnormalities bilateral breasts. No nipple retraction left breast. No nipple discharge left breast. No lymphadenopathy. No lumps palpated bilateral breasts. Complaints of pain at 3 o'clock left breast on exam.  MS DIGITAL DIAG TOMO BILAT  Result Date: 01/21/2020 CLINICAL DATA:  Patient presents for palpable abnormality within the right breast. EXAM: DIGITAL DIAGNOSTIC BILATERAL MAMMOGRAM WITH CAD AND TOMO ULTRASOUND RIGHT BREAST COMPARISON:  Previous exam(s). ACR Breast Density Category c: The breast tissue is heterogeneously dense, which may obscure small masses. FINDINGS: There is an irregular mass within the upper-outer right breast underlying the site of palpable concern. Additionally, at the site of palpable concern there is a 4.8 x 2.2 x 2.6 cm group of pleomorphic calcifications, further evaluated with magnification cc and true lateral views. Questioned asymmetry within the medial periareolar right breast predominately effaced with additional imaging suggestive of dense fibroglandular tissue. Mammographic images were processed with CAD. Targeted ultrasound is performed, showing a 2.7 x 4.1 x 1.8 cm irregular hypoechoic mass right breast 9:30 o'clock 5 cm from nipple the site of palpable concern. No suspicious abnormality within the medial periareolar right breast. Dense tissue is visualized. No  right axillary lymphadenopathy. IMPRESSION: At the site of palpable concern within the upper-outer right breast there is a 4.1 cm irregular hypoechoic mass with associated group of pleomorphic calcifications measuring approximately 4.9 cm in greatest dimension. Overall findings are concerning for malignancy at the site of palpable concern. RECOMMENDATION: Ultrasound-guided core needle biopsy of the right breast mass 9:30 o'clock 5 cm from nipple. Consider bilateral breast MRI given breast density and extent of associated calcifications. If needed clinically, stereotactic guided core needle biopsy of either the anterior or posterior margin can be obtained as needed for surgical planning. I have discussed the findings and recommendations with the patient. If applicable, a reminder letter will be sent to the patient regarding the next appointment. BI-RADS CATEGORY  5: Highly suggestive of malignancy. Electronically Signed   By: Lovey Newcomer M.D.   On: 01/21/2020 11:23   MM CLIP PLACEMENT RIGHT  Result Date: 02/04/2020 CLINICAL DATA:  Evaluate biopsy marker EXAM: DIAGNOSTIC RIGHT MAMMOGRAM POST ULTRASOUND BIOPSY COMPARISON:  Previous exam(s). FINDINGS: Mammographic images were obtained following ultrasound guided biopsy of a right breast mass. The biopsy marking clip is in expected position at the site of biopsy. IMPRESSION: Appropriate positioning of the ribbon shaped biopsy marking clip at the site of biopsy in the biopsied right breast mass. Final Assessment: Post Procedure Mammograms for Marker Placement Electronically Signed   By: Dorise Bullion III M.D   On: 02/04/2020 14:40         Pelvic/Bimanual Pap is not indicated today per BCCCP guidelines.   Smoking History: Patient has never smoked.   Patient Navigation: Patient education provided. Access to services provided for patient through [employer] program. Used Spanish interpreter  Rudene Anda from [facility] provided. Transportation provided to [employer] and  diagnostic mammogram appointment at the the [facility] and home following appointments. Patient provided food from the food market at the [facility] for [employer] due to food insecurities.   Breast and Cervical Cancer Risk Assessment: Patient does not have family history of breast cancer. Patient has a personal history of breast cancer. Patient has no known genetic mutations or history of radiation treatment to the chest before age 70. Patient does not have history of cervical dysplasia, immunocompromised, or DES exposure in-utero.  Risk Assessment     Risk Scores       01/10/2021 01/19/2020   Last edited by: Demetrius Revel, LPN McGill, Sherie Mamie Nick, LPN   5-year risk: 1.1 % 0.3 %   Lifetime risk: 9 % 5.8 %            A: BCCCP exam without pap smear Complaint of left breast pain.  P: Referred patient to the [facility] for a left breast diagnostic mammogram. Appointment scheduled Tuesday, January 10, 2021 at 1030.  Loletta Parish, RN 01/10/2021 8:30 AM

## 2021-01-10 NOTE — Therapy (Signed)
[place], [place], [address] Phone: [phone]   Fax:  [phone]  Physical Therapy Treatment  Patient Details  Name: Jill [name] MRN: [id number] Date of Birth: Jul 26, 1975 Referring Provider (PT): Magrinat   Encounter Date: 01/10/2021   PT End of Session - 01/10/21 1540     Visit Number 5    Number of Visits 9   for cancer rehab   Date for PT Re-Evaluation 01/31/21    Authorization Type self pay, CAFA    PT Start Time 1503    PT Stop Time 1540    PT Time Calculation (min) 37 min    Activity Tolerance Treatment limited secondary to medical complications (Comment)    Behavior During Therapy [facility] for tasks assessed/performed             Past Medical History:  Diagnosis Date   Breast cancer ([place]) 03/07/2020   Infected cyst of Bartholin's gland duct    Medical history non-contributory     Past Surgical History:  Procedure Laterality Date   IR IMAGING GUIDED PORT INSERTION  03/02/2020   MASTECTOMY W/ SENTINEL NODE BIOPSY Right 03/07/2020   Procedure: RIGHT MASTECTOMY WITH SENTINEL LYMPH NODE BIOPSY;  Surgeon: Jovita Kussmaul, MD;  Location: [facility];  Service: General;  Laterality: Right;   NO PAST SURGERIES     WISDOM TOOTH EXTRACTION      There were no vitals filed for this visit.   Subjective Assessment - 01/10/21 1504     Subjective I am having increased pain under my armpit. I have to keep a pillow in there and that gives it comfort. I am not sure if it is what you are doing or not but it is sensitive.    Patient is accompained by: Interpreter    Pertinent History Breast CA 03/07/20, with chemo and radiation, mastecomy with SLNB (1/3)  03/07/20    Patient Stated Goals Patient would like to get exercises to help her feel better, to feel well, be able to raise arm all the way    Currently in Pain? No/denies    Pain Score 0-No pain                    Media Information         Document Information  Photos  R axillary redness and swelling  01/10/2021 15:29  Attached To:  Outpatient Rehab on 01/10/21 with Wynelle Beckmann, Melodie Bouillon, PT   Source Information  [facility] [place], [place]  [facility]          PT Education - 01/10/21 1547     Education Details educated pt about signs and symptoms of cellulits, why manual therapy can not be performed in case pt has cellullitis, need to seek medical attention if it worsens, do not perform MLD until cleared by MD    Person(s) Educated Patient    Methods Explanation    Comprehension Verbalized understanding              PT Short Term Goals - 12/08/20 0748       PT SHORT TERM GOAL #1   Title Pt will be I with HEP for low back pain    Status Achieved      PT SHORT TERM GOAL #2   Title Pt will be able to acquire seat cushion for more comfort at rest, seated.    Status Achieved  PT Long Term Goals - 01/03/21 1601       PT LONG TERM GOAL #5   Title Pt will demonstrate 165 degrees of R shoulder flexion to allow her to reach overhead.    Baseline 145    Period Weeks    Status On-going    Target Date 01/31/21      PT LONG TERM GOAL #6   Title Pt will demonstrate 165 degrees of R shoulder abduction to allow her to reach out to the side.    Baseline 153    Time 4    Status On-going    Target Date 01/31/21      PT LONG TERM GOAL #7   Title Pt will be independent in a home exercise program for continued stretching and strengthening.    Time 4    Period Weeks    Status Achieved      PT LONG TERM GOAL #8   Title Pt will report a 50% improvement in pain across R chest to allow improved comfort.    Time 4    Status On-going                   Plan - 01/10/21 1541     Clinical Impression Statement Pt reports to PT and reports she is having increased pain in her R axilla and that has started since her last  session. She was not sure if it was due to cleaning for work or from the swelling. She reports the chip pack that therapist created at last session helps when she wears it but she has increased pain if she touches the area of moves her arm. Therapist noticed redness in the area extending upwards and across her chest. She has discomfor to the touch. Educated pt about cellulitis and to seek medical attention if it worsens. Educated pt that therapist sent an inbasket message to pt's oncologist and cc'd his nurses as well as attached a picture in pt's chart. Asked pt's dr if he could reach out to pt on next steps to take. Educated pt that therapist could not perform massage to the area in case she does have an infection that it could potentially spread it. Ended session early due to redness and pain.    PT Frequency 2x / week    PT Duration 4 weeks    PT Treatment/Interventions ADLs/Self Care Home Management;Therapeutic exercise;Patient/family education;Manual techniques;Manual lymph drainage;Compression bandaging;Scar mobilization;Passive range of motion;Taping    PT Next Visit Plan did pt have cellulitis? cont STM, AAROM, AROM,MLD to lateral trunk and axilla,check pt paperwork from Dr. Jana Hakim for bra    Consulted and Agree with Plan of Care Patient             Patient will benefit from skilled therapeutic intervention in order to improve the following deficits and impairments:  Decreased range of motion, Decreased strength, Increased fascial restricitons, Impaired flexibility, Postural dysfunction, Pain, Decreased scar mobility, Decreased knowledge of precautions, Increased edema  Visit Diagnosis: Lymphedema, not elsewhere classified  Aftercare following surgery for neoplasm     Problem List Patient Active Problem List   Diagnosis Date Noted   Port-A-Cath in place 05/11/2020   Genetic testing 03/04/2020   Malignant neoplasm of upper-outer quadrant of right breast in female, estrogen  receptor positive ([place]) 02/10/2020   [redacted] weeks gestation of pregnancy 03/16/2019   Normal labor 03/15/2019    [place] 01/10/2021, 3:48 PM  [place] [phone]  [place], [place], [address] Phone: [phone]   Fax:  [phone]  Name: Jill [name] MRN: [id number] Date of Birth: 11-09-1975  Manus Gunning, PT 01/10/21 3:48 PM

## 2021-01-11 ENCOUNTER — Telehealth: Payer: Self-pay | Admitting: *Deleted

## 2021-01-11 NOTE — Telephone Encounter (Signed)
This RN spoke with pt via [employer] serviced per [place] speaking pt- informed her of visit 7/21 at 1030 for lab and then visit to evaluate area of concern noted by PT.  Pt verbalized understanding of time and location of appt.  Of note she denies any fevers at this time.  Appts scheduled by this RN.

## 2021-01-12 ENCOUNTER — Encounter: Payer: Self-pay | Admitting: Adult Health

## 2021-01-12 ENCOUNTER — Inpatient Hospital Stay: Payer: Self-pay

## 2021-01-12 ENCOUNTER — Inpatient Hospital Stay (HOSPITAL_BASED_OUTPATIENT_CLINIC_OR_DEPARTMENT_OTHER): Payer: Self-pay | Admitting: Adult Health

## 2021-01-12 ENCOUNTER — Encounter: Payer: Self-pay | Admitting: Oncology

## 2021-01-12 ENCOUNTER — Other Ambulatory Visit: Payer: Self-pay

## 2021-01-12 ENCOUNTER — Other Ambulatory Visit: Payer: Self-pay | Admitting: Adult Health

## 2021-01-12 ENCOUNTER — Other Ambulatory Visit (HOSPITAL_COMMUNITY): Payer: Self-pay

## 2021-01-12 VITALS — BP 109/61 | HR 88 | Temp 97.7°F | Resp 18 | Ht <= 58 in | Wt 158.5 lb

## 2021-01-12 DIAGNOSIS — M546 Pain in thoracic spine: Secondary | ICD-10-CM

## 2021-01-12 DIAGNOSIS — C50411 Malignant neoplasm of upper-outer quadrant of right female breast: Secondary | ICD-10-CM

## 2021-01-12 DIAGNOSIS — Z9011 Acquired absence of right breast and nipple: Secondary | ICD-10-CM

## 2021-01-12 DIAGNOSIS — Z170 Estrogen receptor positive status [ER+]: Secondary | ICD-10-CM

## 2021-01-12 DIAGNOSIS — M79601 Pain in right arm: Secondary | ICD-10-CM

## 2021-01-12 LAB — CMP (CANCER CENTER ONLY)
ALT: 24 U/L (ref 0–44)
AST: 28 U/L (ref 15–41)
Albumin: 3.9 g/dL (ref 3.5–5.0)
Alkaline Phosphatase: 70 U/L (ref 38–126)
Anion gap: 7 (ref 5–15)
BUN: 12 mg/dL (ref 6–20)
CO2: 27 mmol/L (ref 22–32)
Calcium: 9.1 mg/dL (ref 8.9–10.3)
Chloride: 107 mmol/L (ref 98–111)
Creatinine: 0.65 mg/dL (ref 0.44–1.00)
GFR, Estimated: >60 mL/min (ref >60)
Glucose, Bld: 103 mg/dL — ABNORMAL HIGH (ref 70–99)
Potassium: 4.2 mmol/L (ref 3.5–5.1)
Sodium: 141 mmol/L (ref 135–145)
Total Bilirubin: 0.7 mg/dL (ref 0.3–1.2)
Total Protein: 6.4 g/dL — ABNORMAL LOW (ref 6.5–8.1)

## 2021-01-12 LAB — CBC WITH DIFFERENTIAL/PLATELET
Abs Immature Granulocytes: 0.01 10*3/uL (ref 0.00–0.07)
Basophils Absolute: 0 10*3/uL (ref 0.0–0.1)
Basophils Relative: 1 %
Eosinophils Absolute: 0.1 10*3/uL (ref 0.0–0.5)
Eosinophils Relative: 2 %
HCT: 37.9 % (ref 36.0–46.0)
Hemoglobin: 13 g/dL (ref 12.0–15.0)
Immature Granulocytes: 0 %
Lymphocytes Relative: 29 %
Lymphs Abs: 0.8 10*3/uL (ref 0.7–4.0)
MCH: 31.5 pg (ref 26.0–34.0)
MCHC: 34.3 g/dL (ref 30.0–36.0)
MCV: 91.8 fL (ref 80.0–100.0)
Monocytes Absolute: 0.2 10*3/uL (ref 0.1–1.0)
Monocytes Relative: 9 %
Neutro Abs: 1.6 10*3/uL — ABNORMAL LOW (ref 1.7–7.7)
Neutrophils Relative %: 59 %
Platelets: 333 10*3/uL (ref 150–400)
RBC: 4.13 MIL/uL (ref 3.87–5.11)
RDW: 12.4 % (ref 11.5–15.5)
WBC: 2.7 10*3/uL — ABNORMAL LOW (ref 4.0–10.5)
nRBC: 0 % (ref 0.0–0.2)

## 2021-01-12 MED ORDER — AMOXICILLIN-POT CLAVULANATE 875-125 MG PO TABS
1.0000 | ORAL_TABLET | Freq: Two times a day (BID) | ORAL | 0 refills | Status: DC
  Filled 2021-01-12: qty 14, 7d supply, fill #0

## 2021-01-12 NOTE — Progress Notes (Signed)
Jill [name]  Telephone:([phone]) [phone] Fax:(336) [phone]     ID: Jill [name] DOB: 10/30/1975  MR#: [id number]  HYI#:[id number]  [name] Care Team: Jill Pounds, NP as PCP - General (Nurse Practitioner) [name], Jill Dad, MD as Consulting Physician (Oncology) Jill Kussmaul, MD as Consulting Physician (General Surgery) [name], Jill Lofty, DO as Attending Physician (Plastic Surgery) Jill Kaufmann, RN as Oncology Nurse Navigator Jill Germany, RN as Oncology Nurse Navigator Jill Dock, NP OTHER MD:  CHIEF COMPLAINT: triple positive breast cancer (s/p right mastectomy)  CURRENT TREATMENT: trastuzumab   INTERVAL HISTORY: Jill [name] returns today for follow up of her triple positive breast cancer.  Accompanying her via Jill [place] interpreter line was Jill [name].   She continues on trastuzumab.  She has absolutely no side effects from these treatments.  Jill [name] has been seeing PT, and her PT noted some redness over her right mastectomy site.  Due to Jill redness PT did not do massage.  She has some swelling and tenderness that she says is present.  She notes she started feeling this when PT started.  She cannot tell me when Jill redness started, or pinpoint a date that it began.  She has difficulty with pain in her right shoulder due to this.    She is taking tamoxifen daily and tolerates it well.  She denies any significant side effects due to this.    REVIEW OF SYSTEMS: Review of Systems  Constitutional:  Negative for appetite change, chills, fatigue, fever and unexpected weight change.  HENT:   Negative for hearing loss, lump/mass and trouble swallowing.   Eyes:  Negative for eye problems and icterus.  Respiratory:  Negative for chest tightness, cough and shortness of breath.   Cardiovascular:  Negative for chest pain, leg swelling and palpitations.  Gastrointestinal:  Negative for abdominal distention, abdominal pain, constipation,  diarrhea, nausea and vomiting.  Endocrine: Negative for hot flashes.  Genitourinary:  Negative for difficulty urinating.   Musculoskeletal:  Negative for arthralgias.  Skin:  Negative for itching and rash.  Neurological:  Negative for dizziness, extremity weakness, headaches and numbness.  Hematological:  Negative for adenopathy. Does not bruise/bleed easily.  Psychiatric/Behavioral:  Negative for depression. Jill [name] is not nervous/anxious.      COVID 19 VACCINATION STATUS: fully vaccinated [employer]), no booster as of December 2021   HISTORY OF CURRENT ILLNESS: From Jill original intake note:  "Jill [name]" herself palpated a right breast abnormality sometime in 2019.,  She did not pay much mind.  This was noted again during her pregnancy and she underwent bilateral diagnostic mammography with tomography and right breast ultrasonography at [facility] on 01/21/2020 showing: breast density category C; 4.1 cm irregular mass with associated group of pleomorphic calcifications measuring approximately 4.9 cm, located at site of palpable concern in upper-outer right breast at 9:30; no right axillary lymphadenopathy.  Accordingly on 02/04/2020 she proceeded to biopsy of Jill right breast area in question. Jill pathology from this procedure ([id number]) showed: invasive and in situ mammary carcinoma, e-cadherin positive, grade 2. Prognostic indicators significant for: estrogen receptor, 100% positive and progesterone receptor, 100% positive, both with strong staining intensity. Proliferation marker Ki67 at 20%. HER2 equivocal by immunohistochemistry (2+), but positive by fluorescent in situ hybridization with a signals ratio 5.39 and number per cell 11.05.  Jill [name]'s subsequent history is as detailed below.   PAST MEDICAL HISTORY: Past Medical History:  Diagnosis Date   Breast cancer ([place]) 03/07/2020  Infected cyst of Bartholin's gland duct    Medical history non-contributory     PAST  SURGICAL HISTORY: Past Surgical History:  Procedure Laterality Date   IR IMAGING GUIDED PORT INSERTION  03/02/2020   MASTECTOMY W/ SENTINEL NODE BIOPSY Right 03/07/2020   Procedure: RIGHT MASTECTOMY WITH SENTINEL LYMPH NODE BIOPSY;  Surgeon: Jill Kussmaul, MD;  Location: [facility];  Service: General;  Laterality: Right;   NO PAST SURGERIES     WISDOM TOOTH EXTRACTION      FAMILY HISTORY: Family History  Problem Relation Age of Onset   Diabetes Maternal Grandmother    Hypertension Mother   Jill [name]'s father is 42 and her mother 41 as of August 2021.  Jill [name] is 3 brothers and 2 sisters.  There is no history of cancer in Jill family to her knowledge   GYNECOLOGIC HISTORY:  No LMP recorded. (Menstrual status: IUD). Menarche: 45 years old Age at first live birth: 45 years old [place] P 4 LMP irregular Contraceptive: Mirena IUD in place HRT n/a  Hysterectomy? no BSO? no   SOCIAL HISTORY: (updated 01/2020)  Jill [name] sometimes works for a [employer] but she is currently not working, taking care of Jill children and Jill household.  Her significant other Jill [name] works Occupational hygienist.  They are both from [place].  Jill [name]'s children are ages 23, 57, 7, and 12 months as of August 2021.  Jill older 3 children are from a different father and their last name is Jill [name].  (Jill [name] was not married to Mr. Jill [name] so there is no legal issue regarding access to medical records, etc.). Jill 33-monthold is a child of Jill [name] and Jill [name]  Jill [name] [place] a [place] Not in place   HEALTH MAINTENANCE: Social History   Tobacco Use   Smoking status: Never   Smokeless tobacco: Never  Vaping Use   Vaping Use: Never used  Substance Use Topics   Alcohol use: Never   Drug use: Never     Colonoscopy: n/a (age)  PAP: 10/2018, negative  Bone density: n/a (age)   No Known Allergies  Current Outpatient Medications  Medication  Sig Dispense Refill   gabapentin (NEURONTIN) 300 MG capsule Take 1 capsule (300 mg total) by mouth at bedtime as needed (burning pain). Instructions in spanish 90 capsule 1   ibuprofen (ADVIL) 200 MG tablet Take 400 mg by mouth every 6 (six) hours as needed for headache or moderate pain.     tamoxifen (NOLVADEX) 20 MG tablet Take 1 tablet (20 mg total) by mouth daily. 90 tablet 12   No current facility-administered medications for this visit.    OBJECTIVE: Spanish speaker who appears stated age  V23   01/12/21 1050  BP: 109/61  Pulse: 88  Resp: 18  Temp: 97.7 F (36.5 C)  SpO2: 98%     Body mass index is 33.13 kg/m.   Wt Readings from Last 3 Encounters:  01/12/21 158 lb 8 oz (71.9 kg)  01/10/21 158 lb 3.2 oz (71.8 kg)  01/02/21 160 lb 12.8 oz (72.9 kg)      ECOG FS:1 - Symptomatic but completely ambulatory GENERAL: [name] is a well appearing female in no acute distress HEENT:  Sclerae anicteric.  Mask in place.  Neck is supple.  NODES:  No cervical, supraclavicular, or axillary lymphadenopathy palpated.  BREAST EXAM:  increased thickening over Jill right upper chest wall, axilla,  LUNGS:  Clear  to auscultation bilaterally.  No wheezes or rhonchi. HEART:  Regular rate and rhythm. No murmur appreciated. ABDOMEN:  Soft, nontender.  Positive, normoactive bowel sounds. No organomegaly palpated. MSK:  + focal spinal tenderness throughout entire spine, [name] notes increased pain,  EXTREMITIES: + Right arm swelling, decreased right arm strength, strength 5/5 in bilateral extremities SKIN:  Clear with no obvious rashes or skin changes. No nail dyscrasia. NEURO:  Nonfocal. Well oriented.  Appropriate affect.    LAB RESULTS:  CMP     Component Value Date/Time   NA 142 12/28/2020 0932   NA 138 12/15/2019 1543   K 3.8 12/28/2020 0932   CL 108 12/28/2020 0932   CO2 26 12/28/2020 0932   GLUCOSE 116 (H) 12/28/2020 0932   BUN 11 12/28/2020 0932   BUN 10 12/15/2019 1543    CREATININE 0.68 12/28/2020 0932   CREATININE 0.77 02/22/2020 1353   CALCIUM 9.0 12/28/2020 0932   PROT 6.7 12/28/2020 0932   PROT 7.3 12/15/2019 1543   ALBUMIN 3.5 12/28/2020 0932   ALBUMIN 4.5 12/15/2019 1543   AST 31 12/28/2020 0932   AST 13 (L) 02/22/2020 1353   ALT 30 12/28/2020 0932   ALT 8 02/22/2020 1353   ALKPHOS 67 12/28/2020 0932   BILITOT 0.4 12/28/2020 0932   BILITOT 0.3 02/22/2020 1353   GFRNONAA >60 12/28/2020 0932   GFRNONAA >60 02/22/2020 1353   GFRAA >60 03/28/2020 0813   GFRAA >60 02/22/2020 1353    No results found for: TOTALPROTELP, ALBUMINELP, A1GS, A2GS, BETS, BETA2SER, GAMS, MSPIKE, SPEI  Lab Results  Component Value Date   WBC 2.7 (L) 01/12/2021   NEUTROABS 1.6 (L) 01/12/2021   HGB 13.0 01/12/2021   HCT 37.9 01/12/2021   MCV 91.8 01/12/2021   PLT 333 01/12/2021    No results found for: LABCA2  No components found for: [id number]  No results for input(s): INR in Jill last 168 hours.  No results found for: LABCA2  No results found for: QJF354  No results found for: TGY563  No results found for: SLH734  No results found for: CA2729  No components found for: HGQUANT  No results found for: CEA1 / No results found for: CEA1   No results found for: AFPTUMOR  No results found for: CHROMOGRNA  No results found for: KPAFRELGTCHN, LAMBDASER, KAPLAMBRATIO (kappa/lambda light chains)  Lab Results  Component Value Date   HGBA 97.0 10/10/2018   (Hemoglobinopathy evaluation)   No results found for: LDH  No results found for: IRON, TIBC, IRONPCTSAT (Iron and TIBC)  No results found for: FERRITIN  Urinalysis    Component Value Date/Time   COLORURINE YELLOW 12/31/2017 2039   APPEARANCEUR CLOUDY (A) 12/31/2017 2039   LABSPEC 1.015 12/31/2017 2039   PHURINE 5.0 12/31/2017 2039   GLUCOSEU NEGATIVE 12/31/2017 2039   HGBUR MODERATE (A) 12/31/2017 2039   BILIRUBINUR NEGATIVE 12/31/2017 2039   KETONESUR NEGATIVE 12/31/2017 2039    PROTEINUR NEGATIVE 12/31/2017 2039   NITRITE NEGATIVE 12/31/2017 2039   LEUKOCYTESUR LARGE (A) 12/31/2017 2039    STUDIES: US BREAST LTD UNI LEFT INC AXILLA  Result Date: 01/10/2021 CLINICAL DATA:  45 year old female with diffuse pain in Jill UPPER and OUTER LEFT breast, noticed after Port-A-Cath placement in September. History of RIGHT breast cancer and RIGHT mastectomy. EXAM: DIGITAL DIAGNOSTIC UNILATERAL LEFT MAMMOGRAM WITH TOMOSYNTHESIS AND CAD; ULTRASOUND LEFT BREAST LIMITED TECHNIQUE: Left digital diagnostic mammography and breast tomosynthesis was performed. Jill images were evaluated with computer-aided detection.; Targeted ultrasound examination of  Jill left breast was performed COMPARISON:  Previous exam(s). ACR Breast Density Category b: There are scattered areas of fibroglandular density. FINDINGS: Full field views of Jill LEFT breast demonstrate no suspicious mass, distortion or worrisome calcifications. Portion of Jill [name]'s port is visualized. Targeted ultrasound is performed, showing no sonographic abnormalities within Jill UPPER-OUTER LEFT breast except for Jill [name]'s Port-A-Cath. No abnormal fluid adjacent to Jill Port-A-Cath is noted. IMPRESSION: 1. No findings to suggest a cause for this [name]'s diffuse LEFT breast pain. 2. No mammographic evidence of breast malignancy. 3. LEFT Port-A-Cath without adjacent collection/fluid. RECOMMENDATION: LEFT screening mammogram in 1 year. I have discussed Jill findings and recommendations with Jill [name]. If applicable, a reminder letter will be sent to Jill [name] regarding Jill next appointment. BI-RADS CATEGORY  1: Negative. Electronically Signed   By: Margarette Canada M.D.   On: 01/10/2021 11:04  ECHOCARDIOGRAM COMPLETE  Result Date: 01/02/2021    ECHOCARDIOGRAM REPORT   [name] Name:   [facility] Date of Exam: 01/02/2021 Medical Rec #:  [id number]                        Height:       58.0 in Accession #:    [id number]                        Weight:       159.8 lb Date of Birth:  11-Oct-1975                       BSA:          1.655 m [name] Age:    33 years                         BP:           101/67 mmHg [name] Gender: F                                HR:           72 bpm. Exam Location:  Outpatient Procedure: 2D Echo and 3D Echo Indications:    breast cancer  History:        [name] has prior history of Echocardiogram examinations, most                 recent 09/28/2020.  Sonographer:    Johny Chess RDCS Referring Phys: [place]  1. Left ventricular ejection fraction, by estimation, is 60 to 65%. Jill left ventricle has normal function. Jill left ventricle has no regional wall motion abnormalities. Left ventricular diastolic parameters are consistent with Grade I diastolic dysfunction (impaired relaxation). Jill average left ventricular global longitudinal strain is -17.0 %.  2. Right ventricular systolic function is normal. Jill right ventricular size is normal. There is normal pulmonary artery systolic pressure.  3. Jill mitral valve is normal in structure. Trivial mitral valve regurgitation. No evidence of mitral stenosis.  4. Jill aortic valve is normal in structure. Aortic valve regurgitation is mild. No aortic stenosis is present.  5. Jill inferior vena cava is normal in size with greater than 50% respiratory variability, suggesting right atrial pressure of 3 mmHg. FINDINGS  Left Ventricle: Left ventricular ejection fraction, by estimation, is 60 to 65%. Jill left ventricle has normal function. Jill  left ventricle has no regional wall motion abnormalities. Jill average left ventricular global longitudinal strain is -17.0 %. Jill left ventricular internal cavity size was normal in size. There is no left ventricular hypertrophy. Left ventricular diastolic parameters are consistent with Grade I diastolic dysfunction (impaired relaxation). Right Ventricle: Jill right ventricular size is normal. No increase in right  ventricular wall thickness. Right ventricular systolic function is normal. There is normal pulmonary artery systolic pressure. Jill tricuspid regurgitant velocity is 2.55 m/s, and  with an assumed right atrial pressure of 3 mmHg, Jill estimated right ventricular systolic pressure is 35.4 mmHg. Left Atrium: Left atrial size was normal in size. Right Atrium: Right atrial size was normal in size. Pericardium: There is no evidence of pericardial effusion. Mitral Valve: Jill mitral valve is normal in structure. Trivial mitral valve regurgitation. No evidence of mitral valve stenosis. Tricuspid Valve: Jill tricuspid valve is normal in structure. Tricuspid valve regurgitation is trivial. No evidence of tricuspid stenosis. Aortic Valve: Jill aortic valve is normal in structure. Aortic valve regurgitation is mild. No aortic stenosis is present. Pulmonic Valve: Jill pulmonic valve was grossly normal. Pulmonic valve regurgitation is not visualized. No evidence of pulmonic stenosis. Aorta: Jill aortic root is normal in size and structure. Venous: Jill inferior vena cava is normal in size with greater than 50% respiratory variability, suggesting right atrial pressure of 3 mmHg. IAS/Shunts: No atrial level shunt detected by color flow Doppler.  LEFT VENTRICLE PLAX 2D LVIDd:         3.90 cm  Diastology LVIDs:         2.50 cm  LV e' medial:    8.05 cm/s LV PW:         0.80 cm  LV E/e' medial:  12.9 LV IVS:        0.70 cm  LV e' lateral:   12.30 cm/s LVOT diam:     1.70 cm  LV E/e' lateral: 8.5 LV SV:         54 LV SV Index:   32       2D Longitudinal Strain LVOT Area:     2.27 cm 2D Strain GLS Avg:     -17.0 %                          3D Volume EF:                         3D EF:        60 %                         LV EDV:       96 ml                         LV ESV:       38 ml                         LV SV:        58 ml RIGHT VENTRICLE             IVC RV S prime:     11.50 cm/s  IVC diam: 1.40 cm TAPSE (M-mode): 1.5 cm LEFT ATRIUM              Index  RIGHT ATRIUM           Index LA diam:        3.40 cm 2.05 cm/m  RA Area:     10.80 cm LA Vol (A2C):   36.3 ml 21.93 ml/m RA Volume:   23.30 ml  14.07 ml/m LA Vol (A4C):   34.2 ml 20.66 ml/m LA Biplane Vol: 36.4 ml 21.99 ml/m  AORTIC VALVE LVOT Vmax:   131.00 cm/s LVOT Vmean:  83.200 cm/s LVOT VTI:    0.236 m  AORTA Ao Root diam: 2.20 cm Ao Asc diam:  2.40 cm MITRAL VALVE                TRICUSPID VALVE MV Area (PHT): 3.17 cm     TR Peak grad:   26.0 mmHg MV Decel Time: 239 msec     TR Vmax:        255.00 cm/s MV E velocity: 104.00 cm/s MV A velocity: 102.00 cm/s  SHUNTS MV E/A ratio:  1.02         Systemic VTI:  0.24 m                             Systemic Diam: 1.70 cm Glori Bickers MD Electronically signed by Glori Bickers MD Signature Date/Time: 01/02/2021/11:13:06 AM    Final    MS DIGITAL DIAG TOMO UNI LEFT  Result Date: 01/10/2021 CLINICAL DATA:  45 year old female with diffuse pain in Jill UPPER and OUTER LEFT breast, noticed after Port-A-Cath placement in September. History of RIGHT breast cancer and RIGHT mastectomy. EXAM: DIGITAL DIAGNOSTIC UNILATERAL LEFT MAMMOGRAM WITH TOMOSYNTHESIS AND CAD; ULTRASOUND LEFT BREAST LIMITED TECHNIQUE: Left digital diagnostic mammography and breast tomosynthesis was performed. Jill images were evaluated with computer-aided detection.; Targeted ultrasound examination of Jill left breast was performed COMPARISON:  Previous exam(s). ACR Breast Density Category b: There are scattered areas of fibroglandular density. FINDINGS: Full field views of Jill LEFT breast demonstrate no suspicious mass, distortion or worrisome calcifications. Portion of Jill [name]'s port is visualized. Targeted ultrasound is performed, showing no sonographic abnormalities within Jill UPPER-OUTER LEFT breast except for Jill [name]'s Port-A-Cath. No abnormal fluid adjacent to Jill Port-A-Cath is noted. IMPRESSION: 1. No findings to suggest a cause for this [name]'s diffuse LEFT  breast pain. 2. No mammographic evidence of breast malignancy. 3. LEFT Port-A-Cath without adjacent collection/fluid. RECOMMENDATION: LEFT screening mammogram in 1 year. I have discussed Jill findings and recommendations with Jill [name]. If applicable, a reminder letter will be sent to Jill [name] regarding Jill next appointment. BI-RADS CATEGORY  1: Negative. Electronically Signed   By: Margarette Canada M.D.   On: 01/10/2021 11:04    ELIGIBLE FOR AVAILABLE RESEARCH PROTOCOL: AET  ASSESSMENT: 45 y.o. [place] woman status post right breast upper outer quadrant biopsy 02/04/2020 for a clinical T2 N0, stage IB invasive ductal carcinoma, grade 2, triple positive, with an MIB-1 of 20%  (1) status post right mastectomy and sentinel lymph node sampling 03/07/2020 for a pT2 pN1, stage IB invasive ductal carcinoma, grade 2, with negative margins.  (a) a total of 4 lymph nodes were removed, one positive  (2) adjuvant chemo immunotherapy will consist of carboplatin, docetaxel, trastuzumab and pertuzumab every 21 days x 6 starting 03/29/2020, completed 07/18/2020  (a) pertuzumab discontinued after cycle 2 secondary to diarrhea  (3) trastuzumab continuing to complete a year (through October 2022)  (a) echo 03/18/2020 shows an ejection fraction in Jill 60-65%  range  (b) echo 06/29/2020 showed an EF in Jill 65-70% range  (c) echo 09/28/2020 showed an EF of 55-60%  (4) adjuvant radiation 08/17/2020 through 10/03/2020 Site Technique Total Dose (Gy) Dose per Fx (Gy) Completed Fx Beam Energies  Chest Wall, Right: CW_Rt 3D 50.4/50.4 1.8 28/28 6X, 10X  Chest Wall, Right: CW_Rt_SCLV 3D 50.4/50.4 1.8 28/28 6X, 10X  Chest Wall, Right: CW_Rt_Bst Electron 10/10 2 5/5 6E   (5) genetics testing 03/02/2020 through Jill [facility] Common Hereditary Cancers Panel. . Jill Common Hereditary Cancers Panel found no deleterious mutations in APC, ATM, AXIN2, BARD1, BMPR1A, BRCA1, BRCA2, BRIP1, CDH1, CDK4, CDKN2A (p14ARF), CDKN2A (p16INK4a),  CHEK2, CTNNA1, DICER1, EPCAM (Deletion/duplication testing only), GREM1 (promoter region deletion/duplication testing only), KIT, MEN1, MLH1, MSH2, MSH3, MSH6, MUTYH, NBN, NF1, NHTL1, PALB2, PDGFRA, PMS2, POLD1, POLE, PTEN, RAD50, RAD51C, RAD51D, RNF43, SDHB, SDHC, SDHD, SMAD4, SMARCA4. STK11, TP53, TSC1, TSC2, and VHL.  Jill following genes were evaluated for sequence changes only: SDHA and HOXB13 c.251G>A variant only.   (a)  a Variant of uncertain significance (VUS) in DICER1 at c.3380T>G (p.Ile1127Ser) was noted  (6) tamoxifen started 02/12/2020 in anticipation of possible surgical delays, held during chemotherapy, resumed February 2022  PLAN: Catherina has some erythema on her right chest wall, accompanied by tenderness and swelling.  She also has increased back pain throughout her entire spine.  Her right arm is also swollen.  We will therefore do Jill following work up: Augmentin BID  Doppler of Jill right arm to r/o DVT CT chest w/ contrast MRI total spine  These will rule out DVT, recurrence, and treat Jill area should any infection be present. I reviewed this with her in detail via Jill Victor interpreter tablet, and she verbalized understanding.  I asked my nurse to please start arranging so we can hopefully get to Jill bottom of it by next week shen she is returning for labs/f/u, and herceptin.  She knows to call for any questions that may arise between now and her next appointment.  We are happy to see her sooner if needed.  She will continue to receive Trastuzumab and will continue on Tamoxifen while this work up is ongoing.     Total encounter time 30 minutes.* in lab review, chart review, order entry, face to face visit time, care coordination and documentation of Jill encounter.  Wilber Bihari, NP 01/12/21 11:14 AM Medical Oncology and Hematology [facility] [place], [place] [address] Tel. [phone]    Fax. [phone]    *Total Encounter Time as  defined by Jill Centers for Medicare and Medicaid Services includes, in addition to Jill face-to-face time of a [name] visit (documented in Jill note above) non-face-to-face time: obtaining and reviewing outside history, ordering and reviewing medications, tests or procedures, care coordination (communications with other health care professionals or caregivers) and documentation in Jill medical record.

## 2021-01-13 ENCOUNTER — Ambulatory Visit (HOSPITAL_COMMUNITY)
Admission: RE | Admit: 2021-01-13 | Discharge: 2021-01-13 | Disposition: A | Discharge status: Home / Self Care | Payer: Self-pay | Source: Ambulatory Visit | Attending: Adult Health | Admitting: Adult Health

## 2021-01-13 ENCOUNTER — Other Ambulatory Visit: Payer: Self-pay

## 2021-01-13 ENCOUNTER — Telehealth: Payer: Self-pay | Admitting: Oncology

## 2021-01-13 ENCOUNTER — Other Ambulatory Visit: Payer: Self-pay | Admitting: Adult Health

## 2021-01-13 ENCOUNTER — Encounter: Payer: Self-pay | Admitting: Oncology

## 2021-01-13 ENCOUNTER — Other Ambulatory Visit (HOSPITAL_COMMUNITY): Payer: Self-pay

## 2021-01-13 DIAGNOSIS — Z170 Estrogen receptor positive status [ER+]: Secondary | ICD-10-CM

## 2021-01-13 DIAGNOSIS — M546 Pain in thoracic spine: Secondary | ICD-10-CM

## 2021-01-13 DIAGNOSIS — C50411 Malignant neoplasm of upper-outer quadrant of right female breast: Secondary | ICD-10-CM

## 2021-01-13 DIAGNOSIS — M79601 Pain in right arm: Secondary | ICD-10-CM

## 2021-01-13 NOTE — Telephone Encounter (Signed)
Scheduled appointment per 07/21 los. Patient is aware.

## 2021-01-13 NOTE — Progress Notes (Signed)
Order changed per radiology.  Wilber Bihari NP

## 2021-01-13 NOTE — Progress Notes (Signed)
Right upper extremity venous duplex has been completed. Preliminary results can be found in CV Proc through chart review.  Results were given to Wilber Bihari NP.  01/13/21 2:08 PM Carlos Levering RVT

## 2021-01-17 ENCOUNTER — Telehealth: Payer: Self-pay

## 2021-01-17 NOTE — Telephone Encounter (Signed)
Interpreter called pt and spoke with her.  She was placed on antibiotics and as a result she is not feeling well.  She cancelled todays appt.  No other information was given.

## 2021-01-17 NOTE — Progress Notes (Signed)
[place]  [phone] [phone]     ID: Jill [name] DOB: May 29, 1976  MR#: [id number]  LKT#:[id number]  Patient Care Team: Jill Cruel, MD as PCP - General (Oncology) Jill [name], Jill Dad, MD as Consulting Physician (Oncology) Jill Kussmaul, MD as Consulting Physician (General Surgery) Jill [name], Jill Lofty, DO as Attending Physician (Plastic Surgery) Jill Kaufmann, RN as Oncology Nurse Navigator Jill Germany, RN as Oncology Nurse Navigator Jill Cruel, MD OTHER MD:  CHIEF COMPLAINT: triple positive breast cancer (s/p right mastectomy)  CURRENT TREATMENT: trastuzumab, tamoxifen   INTERVAL HISTORY: Jill [name] returns today for follow up of her triple positive breast cancer.  She is not accompanied by an interpreter  She continues on trastuzumab.  She has absolutely no side effects from these treatments.  Repeat echo earlier this month continues to show an excellent ejection fraction.  She is taking tamoxifen daily and tolerates it well.  She denies any significant side effects due to this.    She had a little bit of the lymphedema in the right arm and had Doppler studies 01/13/2021 which showed no evidence of thrombosis.  She also had mild erythema and was placed on Augmentin for this.  She is completing the treatment with no complications.  She is scheduled for a number of studies-- thoracic spine MRI on 01/20/2021 and total spine metastasis screening, lumbar spine MRI, cervical spine MRI, and chest CT on 01/27/2021.   REVIEW OF SYSTEMS: Jill [name] has an unusual "headache" involving the right temple area.  This can be persistent although it is not present every day.  She denies any dental or ear problems.  In addition she has postsurgical postradiation discomfort in the right upper chest which is expected.  She also "hurts all over".  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  She also had discomfort in  the left breast and she had ultrasonography on that side which was very reassuring.  A detailed review of systems was otherwise [name].  COVID 19 VACCINATION STATUS: fully vaccinated [employer]), no booster as of December 2021   HISTORY OF CURRENT ILLNESS: From the original intake note:  "Jill [name]" herself palpated a right breast abnormality sometime in 2019.,  She did not pay much mind.  This was noted again during her pregnancy and she underwent bilateral diagnostic mammography with tomography and right breast ultrasonography at [facility] on 01/21/2020 showing: breast density category C; 4.1 cm irregular mass with associated group of pleomorphic calcifications measuring approximately 4.9 cm, located at site of palpable concern in upper-outer right breast at 9:30; no right axillary lymphadenopathy.  Accordingly on 02/04/2020 she proceeded to biopsy of the right breast area in question. The pathology from this procedure ([id number]) showed: invasive and in situ mammary carcinoma, e-cadherin positive, grade 2. Prognostic indicators significant for: estrogen receptor, 100% positive and progesterone receptor, 100% positive, both with strong staining intensity. Proliferation marker Ki67 at 20%. HER2 equivocal by immunohistochemistry (2+), but positive by fluorescent in situ hybridization with a signals ratio 5.39 and number per cell 11.05.  The patient's subsequent history is as detailed below.   PAST MEDICAL HISTORY: Past Medical History:  Diagnosis Date   Breast cancer ([place]) 03/07/2020   Infected cyst of Bartholin's gland duct    Medical history non-contributory     PAST SURGICAL HISTORY: Past Surgical History:  Procedure Laterality Date   IR IMAGING GUIDED PORT INSERTION  03/02/2020   MASTECTOMY W/ SENTINEL NODE BIOPSY  Right 03/07/2020   Procedure: RIGHT MASTECTOMY WITH SENTINEL LYMPH NODE BIOPSY;  Surgeon: Jill Kussmaul, MD;  Location: [facility];  Service: General;   Laterality: Right;   NO PAST SURGERIES     WISDOM TOOTH EXTRACTION      FAMILY HISTORY: Family History  Problem Relation Age of Onset   Diabetes Maternal Grandmother    Hypertension Mother   The patient's father is 60 and her mother 62 as of August 2021.  The patient is 3 brothers and 2 sisters.  There is no history of cancer in the family to her knowledge   GYNECOLOGIC HISTORY:  No LMP recorded. (Menstrual status: IUD). Menarche: 45 years old Age at first live birth: 45 years old [place] P 4 LMP irregular Contraceptive: Mirena IUD in place HRT n/a  Hysterectomy? no BSO? no   SOCIAL HISTORY: (updated 01/2020)  [name] sometimes works for a [employer] but she is currently not working, taking care of the children and the household.  Her significant other Jill [name] works Occupational hygienist.  They are both from [place].  The patient's children are ages 70, 10, 52, and 75 months as of August 2021.  The older 3 children are from a different father and their last name is Jill [name].  (The patient was not married to Mr. Jill [name] so there is no legal issue regarding access to medical records, etc.). The 80-month-old is a child of Jill [name].  The patient attends a local [place] DIRECTIVES: Not in place   HEALTH MAINTENANCE: Social History   Tobacco Use   Smoking status: Never   Smokeless tobacco: Never  Vaping Use   Vaping Use: Never used  Substance Use Topics   Alcohol use: Never   Drug use: Never     Colonoscopy: n/a (age)  PAP: 10/2018, negative  Bone density: n/a (age)   No Known Allergies  Current Outpatient Medications  Medication Sig Dispense Refill   amoxicillin-clavulanate (AUGMENTIN) 875-125 MG tablet Take 1 tablet by mouth 2 (two) times daily. 14 tablet 0   gabapentin (NEURONTIN) 300 MG capsule Take 1 capsule (300 mg total) by mouth at bedtime as needed (burning pain). Instructions in spanish 90 capsule 1   ibuprofen (ADVIL) 200 MG tablet  Take 400 mg by mouth every 6 (six) hours as needed for headache or moderate pain.     tamoxifen (NOLVADEX) 20 MG tablet Take 1 tablet (20 mg total) by mouth daily. 90 tablet 12   No current facility-administered medications for this visit.   Facility-Administered Medications Ordered in Other Visits  Medication Dose Route Frequency Provider Last Rate Last Admin   heparin lock flush 100 unit/mL  500 Units Intracatheter Once PRN Brennley Curtice, Jill Dad, MD       sodium chloride flush (NS) 0.9 % injection 10 mL  10 mL Intracatheter PRN Netta Fodge, Jill Dad, MD       trastuzumab-anns [facility]) 420 mg in sodium chloride 0.9 % 250 mL chemo infusion  6 mg/kg (Treatment Plan Recorded) Intravenous Once Rayshun Kandler, Jill Dad, MD        OBJECTIVE: Spanish speaker who appears stated age  Vitals:   01/18/21 0803  BP: 119/87  Pulse: 87  Resp: 18  Temp: (!) 97.5 F (36.4 C)  SpO2: 100%     Body mass index is 33.13 kg/m.   Wt Readings from Last 3 Encounters:  01/18/21 158 lb 8 oz (71.9 kg)  01/12/21 158 lb 8 oz (  71.9 kg)  01/10/21 158 lb 3.2 oz (71.8 kg)     ECOG FS:1 - Symptomatic but completely ambulatory  Sclerae unicteric, EOMs intact Wearing a mask No cervical or supraclavicular adenopathy Lungs no rales or rhonchi Heart regular rate and rhythm Abd soft, nontender, positive bowel sounds MSK no focal spinal tenderness, no upper extremity lymphedema Neuro: nonfocal, well oriented, appropriate affect Breasts: The right breast is status post mastectomy and radiation.  There is no evidence of chest wall recurrence.  There is fair range of motion in the right upper extremity.  Left breast is benign   LAB RESULTS:  CMP     Component Value Date/Time   NA 143 01/18/2021 0758   NA 138 12/15/2019 1543   K 4.0 01/18/2021 0758   CL 112 (H) 01/18/2021 0758   CO2 25 01/18/2021 0758   GLUCOSE 92 01/18/2021 0758   BUN 13 01/18/2021 0758   BUN 10 12/15/2019 1543   CREATININE 0.69 01/18/2021 0758    CREATININE 0.65 01/12/2021 1036   CALCIUM 9.2 01/18/2021 0758   PROT 6.6 01/18/2021 0758   PROT 7.3 12/15/2019 1543   ALBUMIN 3.7 01/18/2021 0758   ALBUMIN 4.5 12/15/2019 1543   AST 22 01/18/2021 0758   AST 28 01/12/2021 1036   ALT 17 01/18/2021 0758   ALT 24 01/12/2021 1036   ALKPHOS 74 01/18/2021 0758   BILITOT 0.3 01/18/2021 0758   BILITOT 0.7 01/12/2021 1036   GFRNONAA >60 01/18/2021 0758   GFRNONAA >60 01/12/2021 1036   GFRAA >60 03/28/2020 0813   GFRAA >60 02/22/2020 1353    No results found for: TOTALPROTELP, ALBUMINELP, A1GS, A2GS, BETS, BETA2SER, GAMS, MSPIKE, SPEI  Lab Results  Component Value Date   WBC 3.5 (L) 01/18/2021   NEUTROABS 2.0 01/18/2021   HGB 12.9 01/18/2021   HCT 38.3 01/18/2021   MCV 90.8 01/18/2021   PLT 302 01/18/2021    No results found for: LABCA2  No components found for: XMIWOE321  No results for input(s): INR in the last 168 hours.  No results found for: LABCA2  No results found for: YYQ825  No results found for: OIB704  No results found for: UGQ916  No results found for: CA2729  No components found for: HGQUANT  No results found for: CEA1 / No results found for: CEA1   No results found for: AFPTUMOR  No results found for: CHROMOGRNA  No results found for: KPAFRELGTCHN, LAMBDASER, KAPLAMBRATIO (kappa/lambda light chains)  Lab Results  Component Value Date   HGBA 97.0 10/10/2018   (Hemoglobinopathy evaluation)   No results found for: LDH  No results found for: IRON, TIBC, IRONPCTSAT (Iron and TIBC)  No results found for: FERRITIN  Urinalysis    Component Value Date/Time   COLORURINE YELLOW 12/31/2017 2039   APPEARANCEUR CLOUDY (A) 12/31/2017 2039   LABSPEC 1.015 12/31/2017 2039   PHURINE 5.0 12/31/2017 2039   GLUCOSEU NEGATIVE 12/31/2017 2039   HGBUR MODERATE (A) 12/31/2017 2039   BILIRUBINUR NEGATIVE 12/31/2017 2039   KETONESUR NEGATIVE 12/31/2017 2039   PROTEINUR NEGATIVE 12/31/2017 2039   NITRITE  NEGATIVE 12/31/2017 2039   LEUKOCYTESUR LARGE (A) 12/31/2017 2039    STUDIES: US BREAST LTD UNI LEFT INC AXILLA  Result Date: 01/10/2021 CLINICAL DATA:  45 year old female with diffuse pain in the UPPER and OUTER LEFT breast, noticed after Port-A-Cath placement in September. History of RIGHT breast cancer and RIGHT mastectomy. EXAM: DIGITAL DIAGNOSTIC UNILATERAL LEFT MAMMOGRAM WITH TOMOSYNTHESIS AND CAD; ULTRASOUND LEFT BREAST LIMITED TECHNIQUE:  Left digital diagnostic mammography and breast tomosynthesis was performed. The images were evaluated with computer-aided detection.; Targeted ultrasound examination of the left breast was performed COMPARISON:  Previous exam(s). ACR Breast Density Category b: There are scattered areas of fibroglandular density. FINDINGS: Full field views of the LEFT breast demonstrate no suspicious mass, distortion or worrisome calcifications. Portion of the patient's port is visualized. Targeted ultrasound is performed, showing no sonographic abnormalities within the UPPER-OUTER LEFT breast except for the patient's Port-A-Cath. No abnormal fluid adjacent to the Port-A-Cath is noted. IMPRESSION: 1. No findings to suggest a cause for this patient's diffuse LEFT breast pain. 2. No mammographic evidence of breast malignancy. 3. LEFT Port-A-Cath without adjacent collection/fluid. RECOMMENDATION: LEFT screening mammogram in 1 year. I have discussed the findings and recommendations with the patient. If applicable, a reminder letter will be sent to the patient regarding the next appointment. BI-RADS CATEGORY  1: Negative. Electronically Signed   By: Margarette Canada M.D.   On: 01/10/2021 11:04  ECHOCARDIOGRAM COMPLETE  Result Date: 01/02/2021    ECHOCARDIOGRAM REPORT   Patient Name:   Jill [name] Date of Exam: 01/02/2021 Medical Rec #:  [id number]                        Height:       58.0 in Accession #:    [id number]                       Weight:       159.8 lb Date of  Birth:  October 05, 1975                       BSA:          1.655 m Patient Age:    30 years                         BP:           101/67 mmHg Patient Gender: F                                HR:           72 bpm. Exam Location:  Outpatient Procedure: 2D Echo and 3D Echo Indications:    breast cancer  History:        Patient has prior history of Echocardiogram examinations, most                 recent 09/28/2020.  Sonographer:    Johny Chess RDCS Referring Phys: [place]  1. Left ventricular ejection fraction, by estimation, is 60 to 65%. The left ventricle has normal function. The left ventricle has no regional wall motion abnormalities. Left ventricular diastolic parameters are consistent with Grade I diastolic dysfunction (impaired relaxation). The average left ventricular global longitudinal strain is -17.0 %.  2. Right ventricular systolic function is normal. The right ventricular size is normal. There is normal pulmonary artery systolic pressure.  3. The mitral valve is normal in structure. Trivial mitral valve regurgitation. No evidence of mitral stenosis.  4. The aortic valve is normal in structure. Aortic valve regurgitation is mild. No aortic stenosis is present.  5. The inferior vena cava is normal in size with greater than 50% respiratory variability, suggesting right atrial pressure of 3 mmHg. FINDINGS  Left Ventricle: Left ventricular ejection fraction, by estimation, is 60 to 65%. The left ventricle has normal function. The left ventricle has no regional wall motion abnormalities. The average left ventricular global longitudinal strain is -17.0 %. The left ventricular internal cavity size was normal in size. There is no left ventricular hypertrophy. Left ventricular diastolic parameters are consistent with Grade I diastolic dysfunction (impaired relaxation). Right Ventricle: The right ventricular size is normal. No increase in right ventricular wall thickness. Right  ventricular systolic function is normal. There is normal pulmonary artery systolic pressure. The tricuspid regurgitant velocity is 2.55 m/s, and  with an assumed right atrial pressure of 3 mmHg, the estimated right ventricular systolic pressure is 29.0 mmHg. Left Atrium: Left atrial size was normal in size. Right Atrium: Right atrial size was normal in size. Pericardium: There is no evidence of pericardial effusion. Mitral Valve: The mitral valve is normal in structure. Trivial mitral valve regurgitation. No evidence of mitral valve stenosis. Tricuspid Valve: The tricuspid valve is normal in structure. Tricuspid valve regurgitation is trivial. No evidence of tricuspid stenosis. Aortic Valve: The aortic valve is normal in structure. Aortic valve regurgitation is mild. No aortic stenosis is present. Pulmonic Valve: The pulmonic valve was grossly normal. Pulmonic valve regurgitation is not visualized. No evidence of pulmonic stenosis. Aorta: The aortic root is normal in size and structure. Venous: The inferior vena cava is normal in size with greater than 50% respiratory variability, suggesting right atrial pressure of 3 mmHg. IAS/Shunts: No atrial level shunt detected by color flow Doppler.  LEFT VENTRICLE PLAX 2D LVIDd:         3.90 cm  Diastology LVIDs:         2.50 cm  LV e' medial:    8.05 cm/s LV PW:         0.80 cm  LV E/e' medial:  12.9 LV IVS:        0.70 cm  LV e' lateral:   12.30 cm/s LVOT diam:     1.70 cm  LV E/e' lateral: 8.5 LV SV:         54 LV SV Index:   32       2D Longitudinal Strain LVOT Area:     2.27 cm 2D Strain GLS Avg:     -17.0 %                          3D Volume EF:                         3D EF:        60 %                         LV EDV:       96 ml                         LV ESV:       38 ml                         LV SV:        58 ml RIGHT VENTRICLE             IVC RV S prime:     11.50 cm/s  IVC diam: 1.40 cm TAPSE (M-mode): 1.5 cm LEFT ATRIUM  Index       RIGHT ATRIUM            Index LA diam:        3.40 cm 2.05 cm/m  RA Area:     10.80 cm LA Vol (A2C):   36.3 ml 21.93 ml/m RA Volume:   23.30 ml  14.07 ml/m LA Vol (A4C):   34.2 ml 20.66 ml/m LA Biplane Vol: 36.4 ml 21.99 ml/m  AORTIC VALVE LVOT Vmax:   131.00 cm/s LVOT Vmean:  83.200 cm/s LVOT VTI:    0.236 m  AORTA Ao Root diam: 2.20 cm Ao Asc diam:  2.40 cm MITRAL VALVE                TRICUSPID VALVE MV Area (PHT): 3.17 cm     TR Peak grad:   26.0 mmHg MV Decel Time: 239 msec     TR Vmax:        255.00 cm/s MV E velocity: 104.00 cm/s MV A velocity: 102.00 cm/s  SHUNTS MV E/A ratio:  1.02         Systemic VTI:  0.24 m                             Systemic Diam: 1.70 cm Glori Bickers MD Electronically signed by Glori Bickers MD Signature Date/Time: 01/02/2021/11:13:06 AM    Final    MS DIGITAL DIAG TOMO UNI LEFT  Result Date: 01/10/2021 CLINICAL DATA:  45 year old female with diffuse pain in the UPPER and OUTER LEFT breast, noticed after Port-A-Cath placement in September. History of RIGHT breast cancer and RIGHT mastectomy. EXAM: DIGITAL DIAGNOSTIC UNILATERAL LEFT MAMMOGRAM WITH TOMOSYNTHESIS AND CAD; ULTRASOUND LEFT BREAST LIMITED TECHNIQUE: Left digital diagnostic mammography and breast tomosynthesis was performed. The images were evaluated with computer-aided detection.; Targeted ultrasound examination of the left breast was performed COMPARISON:  Previous exam(s). ACR Breast Density Category b: There are scattered areas of fibroglandular density. FINDINGS: Full field views of the LEFT breast demonstrate no suspicious mass, distortion or worrisome calcifications. Portion of the patient's port is visualized. Targeted ultrasound is performed, showing no sonographic abnormalities within the UPPER-OUTER LEFT breast except for the patient's Port-A-Cath. No abnormal fluid adjacent to the Port-A-Cath is noted. IMPRESSION: 1. No findings to suggest a cause for this patient's diffuse LEFT breast pain. 2. No mammographic  evidence of breast malignancy. 3. LEFT Port-A-Cath without adjacent collection/fluid. RECOMMENDATION: LEFT screening mammogram in 1 year. I have discussed the findings and recommendations with the patient. If applicable, a reminder letter will be sent to the patient regarding the next appointment. BI-RADS CATEGORY  1: Negative. Electronically Signed   By: Margarette Canada M.D.   On: 01/10/2021 11:04  LONG TERM MONITOR (3-14 DAYS)  Result Date: 01/13/2021 Patch Wear Time:  6 days and 22 hours (2022-07-11T11:28:46-0400 to 2022-07-18T10:23:46-0400) 1. Sinus rhythm -  avg HR of 83 bpm. 2. Rare PACs and PVCs. 3. No patient-triggered events Glori Bickers, MD 6:06 PM   VAS [place] UPPER EXTREMITY VENOUS DUPLEX  Result Date: 01/13/2021 UPPER VENOUS STUDY  Patient Name:  [facility] [name]  Date of Exam:   01/13/2021 Medical Rec #: [id number]                         Accession #:    [id number] Date of Birth: 1976/01/02  Patient Gender: F Patient Age:   4Y Exam Location:  [facility] Procedure:      VAS [place] UPPER EXTREMITY VENOUS DUPLEX Referring Phys: [id number] LINDSEY CORNETTO CAUSEY --------------------------------------------------------------------------------  Indications: Pain Risk Factors: Cancer. Comparison Study: No prior studies. Performing Technologist: Oliver Hum RVT  Examination Guidelines: A complete evaluation includes B-mode imaging, spectral Doppler, color Doppler, and power Doppler as needed of all accessible portions of each vessel. Bilateral testing is considered an integral part of a complete examination. Limited examinations for reoccurring indications may be performed as noted.  Right Findings: +----------+------------+---------+-----------+----------+-------+ RIGHT     CompressiblePhasicitySpontaneousPropertiesSummary +----------+------------+---------+-----------+----------+-------+ IJV           Full       Yes       Yes                       +----------+------------+---------+-----------+----------+-------+ Subclavian    Full       Yes       Yes                      +----------+------------+---------+-----------+----------+-------+ Axillary      Full       Yes       Yes                      +----------+------------+---------+-----------+----------+-------+ Brachial      Full       Yes       Yes                      +----------+------------+---------+-----------+----------+-------+ Radial        Full                                          +----------+------------+---------+-----------+----------+-------+ Ulnar         Full                                          +----------+------------+---------+-----------+----------+-------+ Cephalic      Full                                          +----------+------------+---------+-----------+----------+-------+ Basilic       Full                                          +----------+------------+---------+-----------+----------+-------+  Left Findings: +----------+------------+---------+-----------+----------+-------+ LEFT      CompressiblePhasicitySpontaneousPropertiesSummary +----------+------------+---------+-----------+----------+-------+ Subclavian    Full       Yes       Yes                      +----------+------------+---------+-----------+----------+-------+  Summary:  Right: No evidence of deep vein thrombosis in the upper extremity. No evidence of superficial vein thrombosis in the upper extremity.  Left: No evidence of thrombosis in the subclavian.  *See table(s) above for measurements and observations.  Diagnosing physician: Jamelle Haring Electronically signed by Jamelle Haring on 01/13/2021 at 4:43:13 PM.    Final  ELIGIBLE FOR AVAILABLE RESEARCH PROTOCOL: AET  ASSESSMENT: 45 y.o. [place] woman status post right breast upper outer quadrant biopsy 02/04/2020 for a clinical T2 N0, stage IB invasive ductal carcinoma, grade 2,  triple positive, with an MIB-1 of 20%  (1) status post right mastectomy and sentinel lymph node sampling 03/07/2020 for a pT2 pN1, stage IB invasive ductal carcinoma, grade 2, with negative margins.  (a) a total of 4 lymph nodes were removed, one positive  (2) adjuvant chemo immunotherapy will consist of carboplatin, docetaxel, trastuzumab and pertuzumab every 21 days x 6 starting 03/29/2020, completed 07/18/2020  (a) pertuzumab discontinued after cycle 2 secondary to diarrhea  (3) trastuzumab continuing to complete a year (through October 2022)  (a) echo 03/18/2020 shows an ejection fraction in the 60-65% range  (b) echo 06/29/2020 showed an EF in the 65-70% range  (c) echo 09/28/2020 showed an EF of 55-60%  (d) echo 01/02/2021   (4) adjuvant radiation 08/17/2020 through 10/03/2020 Site Technique Total Dose (Gy) Dose per Fx (Gy) Completed Fx Beam Energies  Chest Wall, Right: CW_Rt 3D 50.4/50.4 1.8 28/28 6X, 10X  Chest Wall, Right: CW_Rt_SCLV 3D 50.4/50.4 1.8 28/28 6X, 10X  Chest Wall, Right: CW_Rt_Bst Electron 10/10 2 5/5 6E   (5) genetics testing 03/02/2020 through the [facility] Common Hereditary Cancers Panel. . The Common Hereditary Cancers Panel found no deleterious mutations in APC, ATM, AXIN2, BARD1, BMPR1A, BRCA1, BRCA2, BRIP1, CDH1, CDK4, CDKN2A (p14ARF), CDKN2A (p16INK4a), CHEK2, CTNNA1, DICER1, EPCAM (Deletion/duplication testing only), GREM1 (promoter region deletion/duplication testing only), KIT, MEN1, MLH1, MSH2, MSH3, MSH6, MUTYH, NBN, NF1, NHTL1, PALB2, PDGFRA, PMS2, POLD1, POLE, PTEN, RAD50, RAD51C, RAD51D, RNF43, SDHB, SDHC, SDHD, SMAD4, SMARCA4. STK11, TP53, TSC1, TSC2, and VHL.  The following genes were evaluated for sequence changes only: SDHA and HOXB13 c.251G>A variant only.   (a)  a Variant of uncertain significance (VUS) in DICER1 at c.3380T>G (p.Ile1127Ser) was noted  (6) tamoxifen started 02/12/2020 in anticipation of possible surgical delays, held during chemotherapy,  resumed February 2022   PLAN: Nella has multiple aches and pains which I think are really expected from the treatment she received.  She does have total spinal MRI pending.  I anticipate no significant findings there.  I reassured her that many patients feel like chemotherapy makes them 45 years old and this is temporary.  After a few months things clear up and patients feel more "back to normal".  That I expect will be the case with her.  I did add a head CT just to make sure the headache she has on the right side is not related to her cancer.  She will see me again with her next trastuzumab treatment in 3 weeks.  She knows to call for any other issue that may develop before then  Total encounter time 25 minutes.Sarajane Jews C. Christain Niznik, MD 01/18/21 9:18 AM Medical Oncology and Hematology [facility] [place], [place] [address] Tel. [phone]    Fax. [phone]   I, Wilburn Mylar, am acting as scribe for Dr. Virgie [name]. Spurgeon Gancarz.  I, Lurline Del MD, have reviewed the above documentation for accuracy and completeness, and I agree with the above.    *Total Encounter Time as defined by the Centers for Medicare and Medicaid Services includes, in addition to the face-to-face time of a patient visit (documented in the note above) non-face-to-face time: obtaining and reviewing outside history, ordering and reviewing medications, tests or procedures, care coordination (communications with other health care  professionals or caregivers) and documentation in the medical record.

## 2021-01-18 ENCOUNTER — Other Ambulatory Visit: Payer: Self-pay

## 2021-01-18 ENCOUNTER — Inpatient Hospital Stay: Payer: No Typology Code available for payment source

## 2021-01-18 ENCOUNTER — Inpatient Hospital Stay (HOSPITAL_BASED_OUTPATIENT_CLINIC_OR_DEPARTMENT_OTHER): Payer: Self-pay | Admitting: Oncology

## 2021-01-18 ENCOUNTER — Inpatient Hospital Stay: Payer: Self-pay

## 2021-01-18 VITALS — BP 119/87 | HR 87 | Temp 97.5°F | Resp 18 | Wt 158.5 lb

## 2021-01-18 DIAGNOSIS — C50411 Malignant neoplasm of upper-outer quadrant of right female breast: Secondary | ICD-10-CM

## 2021-01-18 DIAGNOSIS — Z170 Estrogen receptor positive status [ER+]: Secondary | ICD-10-CM

## 2021-01-18 DIAGNOSIS — Z95828 Presence of other vascular implants and grafts: Secondary | ICD-10-CM

## 2021-01-18 DIAGNOSIS — C773 Secondary and unspecified malignant neoplasm of axilla and upper limb lymph nodes: Secondary | ICD-10-CM

## 2021-01-18 LAB — CBC WITH DIFFERENTIAL/PLATELET
Abs Immature Granulocytes: 0 10*3/uL (ref 0.00–0.07)
Basophils Absolute: 0 10*3/uL (ref 0.0–0.1)
Basophils Relative: 1 %
Eosinophils Absolute: 0.1 10*3/uL (ref 0.0–0.5)
Eosinophils Relative: 3 %
HCT: 38.3 % (ref 36.0–46.0)
Hemoglobin: 12.9 g/dL (ref 12.0–15.0)
Immature Granulocytes: 0 %
Lymphocytes Relative: 30 %
Lymphs Abs: 1 10*3/uL (ref 0.7–4.0)
MCH: 30.6 pg (ref 26.0–34.0)
MCHC: 33.7 g/dL (ref 30.0–36.0)
MCV: 90.8 fL (ref 80.0–100.0)
Monocytes Absolute: 0.3 10*3/uL (ref 0.1–1.0)
Monocytes Relative: 8 %
Neutro Abs: 2 10*3/uL (ref 1.7–7.7)
Neutrophils Relative %: 58 %
Platelets: 302 10*3/uL (ref 150–400)
RBC: 4.22 MIL/uL (ref 3.87–5.11)
RDW: 11.9 % (ref 11.5–15.5)
WBC: 3.5 10*3/uL — ABNORMAL LOW (ref 4.0–10.5)
nRBC: 0 % (ref 0.0–0.2)

## 2021-01-18 LAB — COMPREHENSIVE METABOLIC PANEL
ALT: 17 U/L (ref 0–44)
AST: 22 U/L (ref 15–41)
Albumin: 3.7 g/dL (ref 3.5–5.0)
Alkaline Phosphatase: 74 U/L (ref 38–126)
Anion gap: 6 (ref 5–15)
BUN: 13 mg/dL (ref 6–20)
CO2: 25 mmol/L (ref 22–32)
Calcium: 9.2 mg/dL (ref 8.9–10.3)
Chloride: 112 mmol/L — ABNORMAL HIGH (ref 98–111)
Creatinine, Ser: 0.69 mg/dL (ref 0.44–1.00)
GFR, Estimated: >60 mL/min (ref >60)
Glucose, Bld: 92 mg/dL (ref 70–99)
Potassium: 4 mmol/L (ref 3.5–5.1)
Sodium: 143 mmol/L (ref 135–145)
Total Bilirubin: 0.3 mg/dL (ref 0.3–1.2)
Total Protein: 6.6 g/dL (ref 6.5–8.1)

## 2021-01-18 MED ORDER — DIPHENHYDRAMINE HCL 25 MG PO CAPS
ORAL_CAPSULE | ORAL | Status: AC
  Filled 2021-01-18: qty 1

## 2021-01-18 MED ORDER — SODIUM CHLORIDE 0.9 % IV SOLN
Freq: Once | INTRAVENOUS | Status: AC
  Filled 2021-01-18: qty 250

## 2021-01-18 MED ORDER — ACETAMINOPHEN 325 MG PO TABS
ORAL_TABLET | ORAL | Status: AC
  Filled 2021-01-18: qty 2

## 2021-01-18 MED ORDER — TRASTUZUMAB-ANNS CHEMO 150 MG IV SOLR
6.0000 mg/kg | Freq: Once | INTRAVENOUS | Status: AC
  Administered 2021-01-18: 420 mg via INTRAVENOUS
  Filled 2021-01-18: qty 20

## 2021-01-18 MED ORDER — SODIUM CHLORIDE 0.9% FLUSH
10.0000 mL | INTRAVENOUS | Status: DC | PRN
  Administered 2021-01-18: 10 mL
  Filled 2021-01-18: qty 10

## 2021-01-18 MED ORDER — ACETAMINOPHEN 325 MG PO TABS
650.0000 mg | ORAL_TABLET | Freq: Once | ORAL | Status: AC
  Administered 2021-01-18: 650 mg via ORAL

## 2021-01-18 MED ORDER — SODIUM CHLORIDE 0.9% FLUSH
10.0000 mL | Freq: Once | INTRAVENOUS | Status: AC
  Administered 2021-01-18: 10 mL
  Filled 2021-01-18: qty 10

## 2021-01-18 MED ORDER — HEPARIN SOD (PORK) LOCK FLUSH 100 UNIT/ML IV SOLN
500.0000 [IU] | Freq: Once | INTRAVENOUS | Status: AC | PRN
  Administered 2021-01-18: 500 [IU]
  Filled 2021-01-18: qty 5

## 2021-01-18 MED ORDER — DIPHENHYDRAMINE HCL 25 MG PO CAPS
25.0000 mg | ORAL_CAPSULE | Freq: Once | ORAL | Status: AC
  Administered 2021-01-18: 25 mg via ORAL

## 2021-01-18 NOTE — Patient Instructions (Signed)
[facility]  Discharge Instructions: Thank you for choosing [facility] to provide your oncology and hematology care.   If you have a lab appointment with the Cancer Center, please go directly to the Cancer Center and check in at the registration area.   Wear comfortable clothing and clothing appropriate for easy access to any Portacath or PICC line.   We strive to give you quality time with your provider. You may need to reschedule your appointment if you arrive late (15 or more minutes).  Arriving late affects you and other patients whose appointments are after yours.  Also, if you miss three or more appointments without notifying the office, you may be dismissed from the clinic at the provider's discretion.      For prescription refill requests, have your pharmacy contact our office and allow 72 hours for refills to be completed.    Today you received the following chemotherapy and/or immunotherapy agents herceptin      To help prevent nausea and vomiting after your treatment, we encourage you to take your nausea medication as directed.  BELOW ARE SYMPTOMS THAT SHOULD BE REPORTED IMMEDIATELY: *FEVER GREATER THAN 100.4 F (38 C) OR HIGHER *CHILLS OR SWEATING *NAUSEA AND VOMITING THAT IS NOT CONTROLLED WITH YOUR NAUSEA MEDICATION *UNUSUAL SHORTNESS OF BREATH *UNUSUAL BRUISING OR BLEEDING *URINARY PROBLEMS (pain or burning when urinating, or frequent urination) *BOWEL PROBLEMS (unusual diarrhea, constipation, pain near the anus) TENDERNESS IN MOUTH AND THROAT WITH OR WITHOUT PRESENCE OF ULCERS (sore throat, sores in mouth, or a toothache) UNUSUAL RASH, SWELLING OR PAIN  UNUSUAL VAGINAL DISCHARGE OR ITCHING   Items with * indicate a potential emergency and should be followed up as soon as possible or go to the Emergency Department if any problems should occur.  Please show the CHEMOTHERAPY ALERT CARD or IMMUNOTHERAPY ALERT CARD at check-in to  the Emergency Department and triage nurse.  Should you have questions after your visit or need to cancel or reschedule your appointment, please contact [facility]  Dept: [phone]  and follow the prompts.  Office hours are 8:00 a.m. to 4:30 p.m. Monday - Friday. Please note that voicemails left after 4:00 p.m. may not be returned until the following business day.  We are closed weekends and major holidays. You have access to a nurse at all times for urgent questions. Please call the main number to the clinic Dept: [phone] and follow the prompts.   For any non-urgent questions, you may also contact your provider using MyChart. We now offer e-Visits for anyone 18 and older to request care online for non-urgent symptoms. For details visit [email or web address].   Also download the MyChart app! Go to the app store, search "MyChart", open the app, select [facility], and log in with your MyChart username and password.  Due to Covid, a mask is required upon entering the hospital/clinic. If you do not have a mask, one will be given to you upon arrival. For doctor visits, patients may have 1 support person aged 18 or older with them. For treatment visits, patients cannot have anyone with them due to current Covid guidelines and our immunocompromised population.   

## 2021-01-20 ENCOUNTER — Ambulatory Visit (HOSPITAL_COMMUNITY): Admission: RE | Admit: 2021-01-20 | Payer: Self-pay | Source: Ambulatory Visit

## 2021-01-20 ENCOUNTER — Ambulatory Visit (HOSPITAL_COMMUNITY): Payer: No Typology Code available for payment source

## 2021-01-20 ENCOUNTER — Other Ambulatory Visit: Payer: No Typology Code available for payment source

## 2021-01-20 ENCOUNTER — Ambulatory Visit (HOSPITAL_COMMUNITY): Admission: RE | Admit: 2021-01-20 | Payer: No Typology Code available for payment source | Source: Ambulatory Visit

## 2021-01-23 ENCOUNTER — Ambulatory Visit (HOSPITAL_COMMUNITY): Payer: No Typology Code available for payment source

## 2021-01-27 ENCOUNTER — Ambulatory Visit (HOSPITAL_COMMUNITY)
Admission: RE | Admit: 2021-01-27 | Discharge: 2021-01-27 | Disposition: A | Discharge status: Home / Self Care | Payer: Self-pay | Source: Ambulatory Visit | Attending: Adult Health | Admitting: Adult Health

## 2021-01-27 ENCOUNTER — Other Ambulatory Visit: Payer: Self-pay

## 2021-01-27 ENCOUNTER — Ambulatory Visit (HOSPITAL_COMMUNITY): Payer: No Typology Code available for payment source

## 2021-01-27 ENCOUNTER — Other Ambulatory Visit: Payer: Self-pay | Admitting: Adult Health

## 2021-01-27 ENCOUNTER — Ambulatory Visit (HOSPITAL_COMMUNITY): Admission: RE | Admit: 2021-01-27 | Payer: No Typology Code available for payment source | Source: Ambulatory Visit

## 2021-01-27 DIAGNOSIS — Z170 Estrogen receptor positive status [ER+]: Secondary | ICD-10-CM | POA: Insufficient documentation

## 2021-01-27 DIAGNOSIS — C50411 Malignant neoplasm of upper-outer quadrant of right female breast: Secondary | ICD-10-CM | POA: Insufficient documentation

## 2021-01-27 IMAGING — MR MR TOTAL SPINE METS SCREENING
11 of 19 series · 25 of 48 positions shown · IV contrast (7 ML GADAVIST)
Comparison: MRI of the lumbar spine [DATE].

CLINICAL DATA: Breast cancer, assess treatment response Breast
cancer, staging

EXAM:
MRI TOTAL SPINE WITHOUT AND WITH CONTRAST
TECHNIQUE: Multisequence MR imaging of the spine from the cervical spine to the
sacrum was performed prior to and following IV contrast
administration for evaluation of spinal metastatic disease.
Limited metastatic screening protocol performed.
CONTRAST:  7mL GADAVIST GADOBUTROL 1 MMOL/ML IV SOLN

[Series 16: T1 · sagittal · 4.0mm · 1.15mm/px · 2 of 8 slices shown (1 of 5)]
[im 1/8]
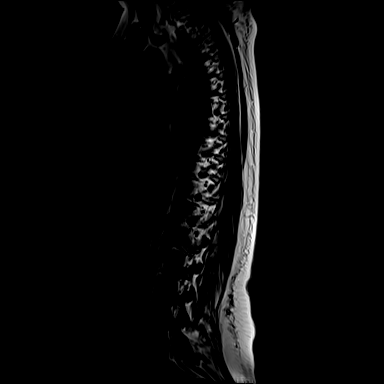
[im 8/8]
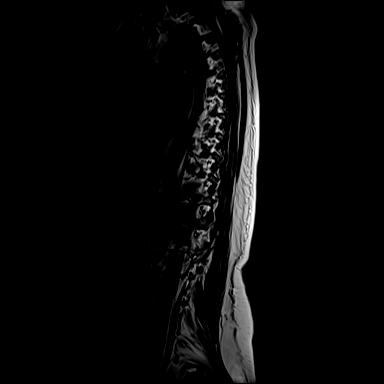

[Series 17: T1 · sagittal · 4.0mm · 1.00mm/px · 3 of 15 slices shown (2 of 5)]
[im 1/15]
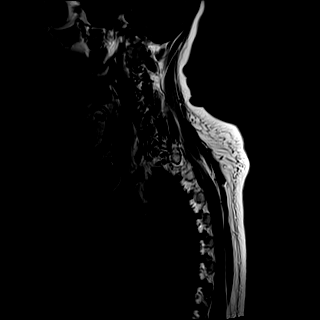
[im 8/15]
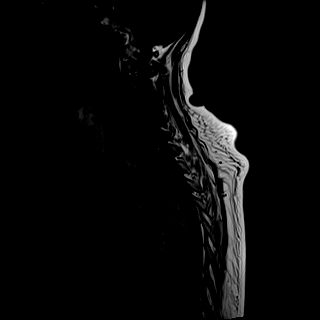
[im 15/15]
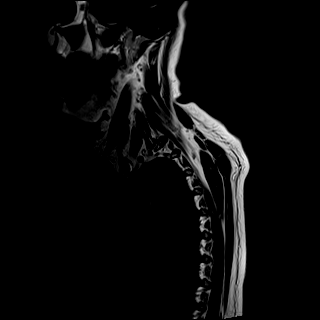

[Series 18: T1 · sagittal · 4.0mm · 1.19mm/px · 3 of 15 slices shown (3 of 5)]
[im 1/15]
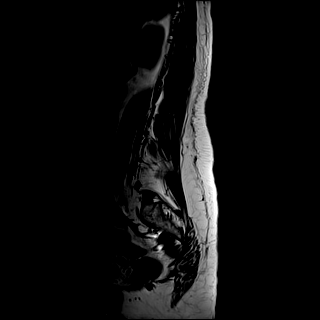
[im 8/15]
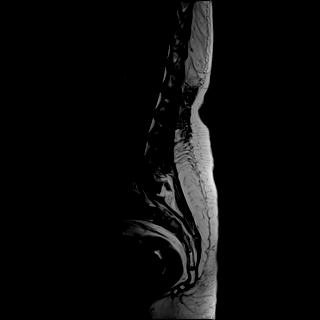
[im 15/15]
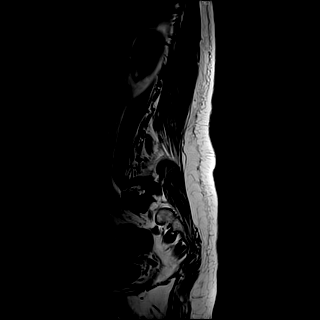

[Series 19: T1 · sagittal · 4.0mm · 1.00mm/px · 3 of 14 slices shown (4 of 5)]
[im 1/14]
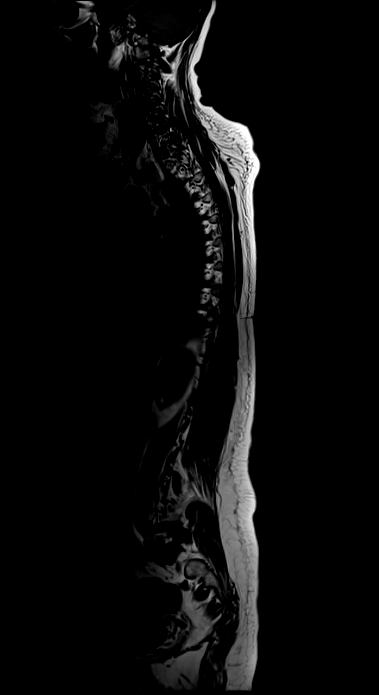
[im 7/14]
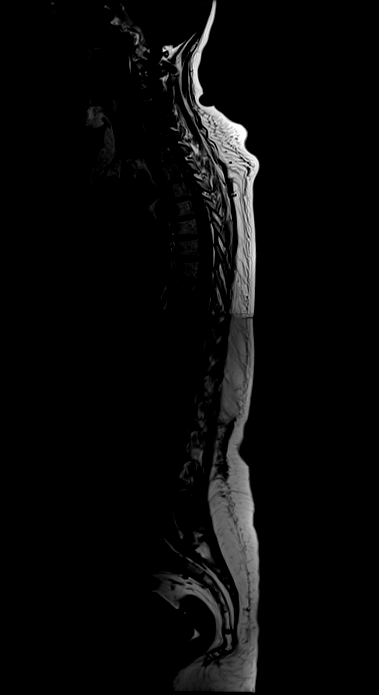
[im 14/14]
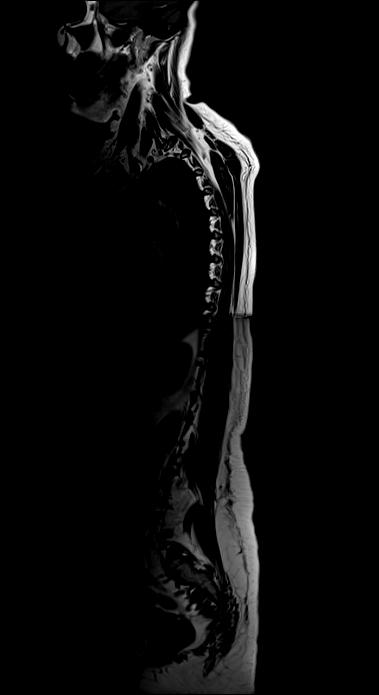

[Series 20: T1 · sagittal · 4.0mm · 1.00mm/px · 3 of 14 slices shown (5 of 5)]
[im 1/14]
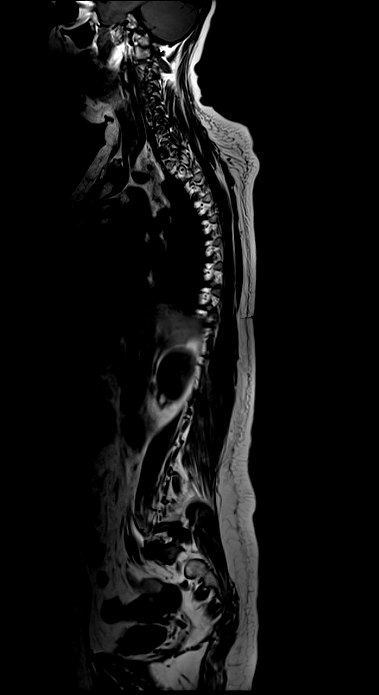
[im 7/14]
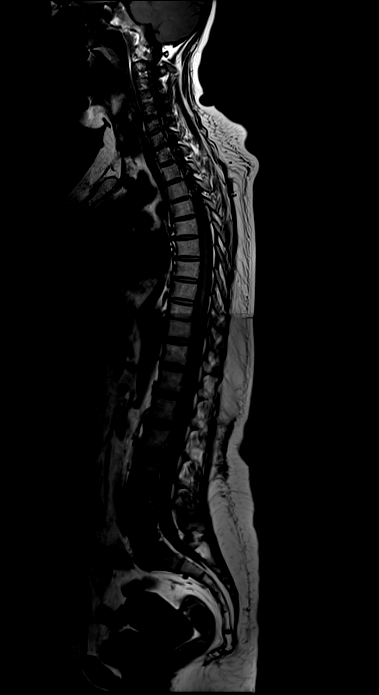
[im 14/14]
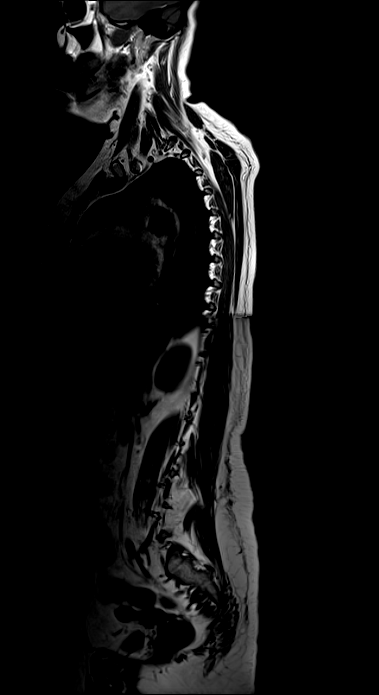

[Series 27: T1 fat-sat post-contrast · sagittal · 4.0mm · 1.00mm/px · 2 of 15 slices shown (1 of 2)]
[im 1/15]
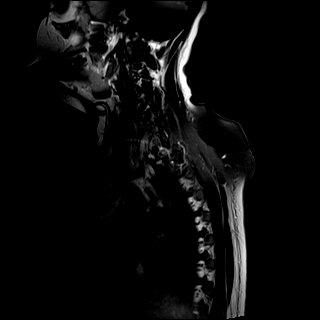
[im 15/15]
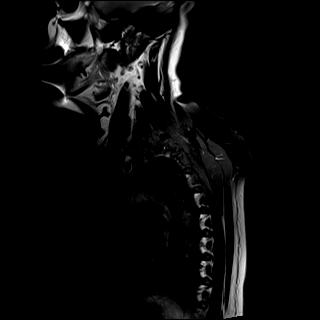

[Series 28: T1 fat-sat post-contrast · sagittal · 4.0mm · 1.00mm/px · 1 of 15 slices shown (2 of 2)]
[im 1/15]
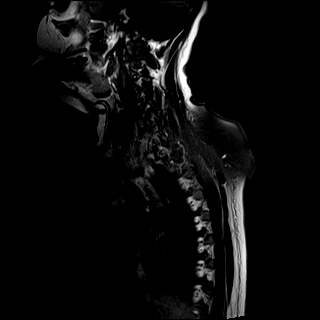

[Series 33: T2 post-contrast · sagittal · 4.0mm · 0.89mm/px · 2 of 15 slices shown (1 of 4)]
[im 1/15]
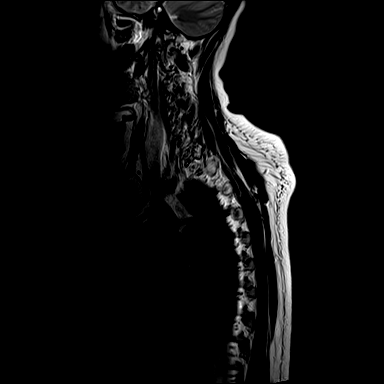
[im 15/15]
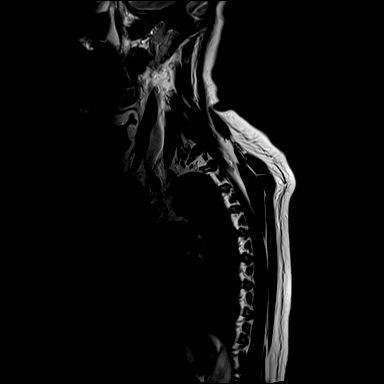

[Series 34: T2 post-contrast · sagittal · 4.0mm · 0.89mm/px · 2 of 15 slices shown (2 of 4)]
[im 1/15]
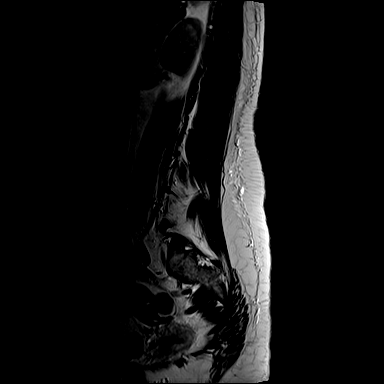
[im 15/15]
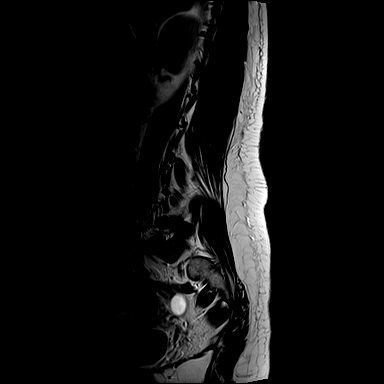

[Series 35: T2 post-contrast · sagittal · 4.0mm · 0.89mm/px · 2 of 15 slices shown (3 of 4)]
[im 1/15]
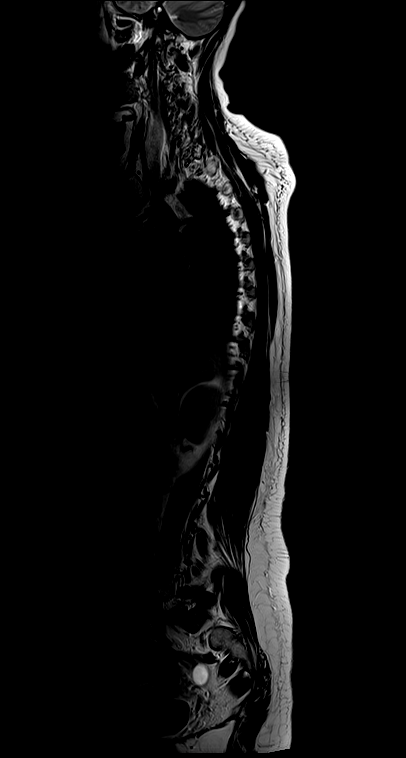
[im 15/15]
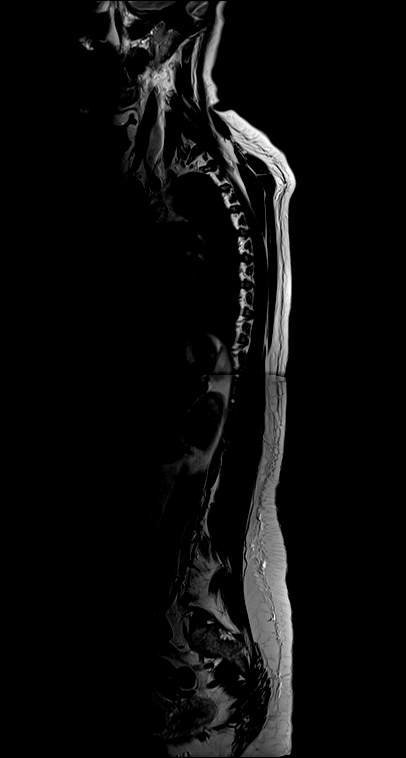

[Series 36: T2 post-contrast · sagittal · 4.0mm · 0.89mm/px · 2 of 15 slices shown (4 of 4)]
[im 1/15]
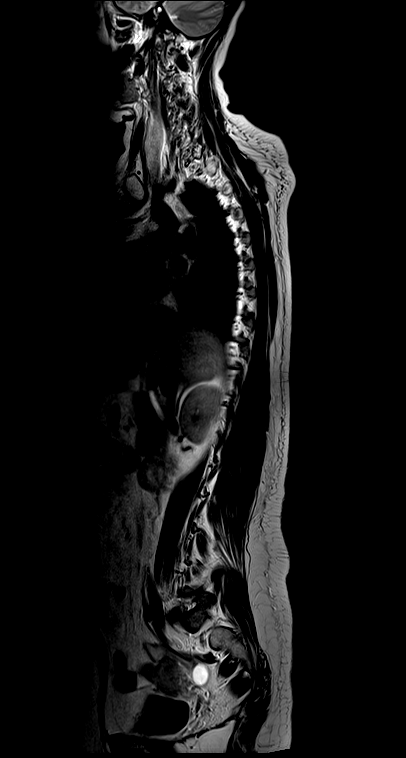
[im 15/15]
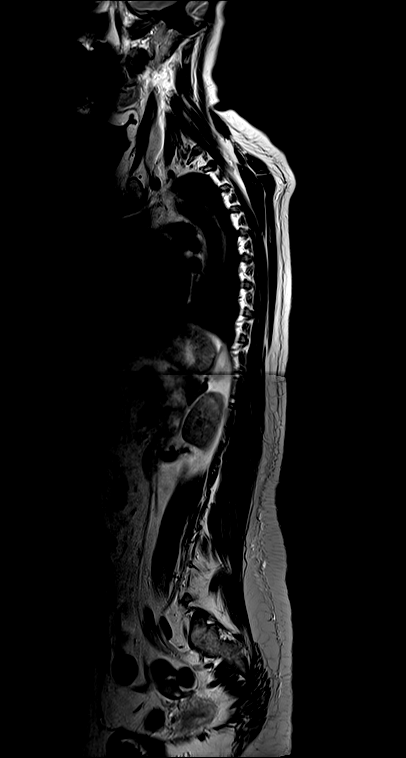

[25 of 48 positions shown; findings below may reference images not displayed]

FINDINGS: MRI CERVICAL SPINE FINDINGS

Alignment: Straightening of the normal cervical lordosis without
sagittal subluxation.

Vertebrae: No abnormal marrow signal to suggest metastatic disease,
acute fracture, or discitis/osteomyelitis. Postcontrast imaging is
significantly limited by artifact, particularly superiorly.

Cord: No obvious cord signal abnormality on sagittal imaging.

Posterior Fossa, vertebral arteries, paraspinal tissues: Vertebral
artery flow voids are not assessed on this study due to limited
protocol. The inferior visualized posterior fossa is unremarkable on
limited sagittal assessment.

Disc levels:

Limited evaluation due to limited protocol. Small disc bulge at
C5-C6 partially effaces ventral CSF without significant canal
stenosis. Limited evaluation of the foramina without axial imaging.
No findings to suggest high-grade foraminal stenosis on sagittal
imaging.

MRI THORACIC SPINE FINDINGS

Alignment:  Normal.

Vertebrae: No abnormal marrow signal to suggest metastatic disease,
acute fracture, or discitis/osteomyelitis. No convincing abnormal
enhancement. Artifact at the edge of imaging in the mid/lower
thoracic spine on postcontrast imaging.

Cord: No obvious cord signal abnormality on sagittal imaging. No
evidence of abnormal enhancement.

Paraspinal and other soft tissues: No paravertebral edema. Please
see same day CT chest for intrathoracic evaluation.

Disc levels:

No significant canal stenosis. Mild disc height loss at T4-T5 and
T5-T6. No high-grade bony foraminal stenosis.

MRI LUMBAR SPINE FINDINGS

Segmentation:  Standard.

Alignment: Grade 1 anterolisthesis of L5 on S1. Otherwise, no
substantial sagittal subluxation.

Vertebrae: No abnormal marrow signal to suggest metastatic disease,
acute fracture, or discitis/osteomyelitis. No evidence of osseous
enhancement in the lumbar spine. Mild perifacet enhancement at
L5-S1, likely degenerative given degenerative changes at this level
(see below).

Conus medullaris: Extends to the superior L2 level and appears
normal. No evidence of abnormal enhancement of the conus or cauda
equina on sagittal imaging.

Paraspinal and other soft tissues: Mild edema in the posterior
paraspinal subcutaneous fat, physiologic. Mild perifacet
inflammatory changes at L5-S1. possible adnexal cyst, incompletely
characterized on this limited study.

Disc levels:

Suspected pars defects L5 better characterized on prior MRI from
[DATE]. Similar mild bilateral foraminal stenosis at L5-S1.
IMPRESSION: 1. No specific evidence of osseous metastatic disease in the
cervical, thoracic, or lumbar spine on this limited screening
protocol MRI.
2. Suspected L5 pars defects and mild bilateral foraminal stenosis
at L5-S1, likely similar and better characterized on prior MRI from
[DATE].

## 2021-01-27 MED ORDER — GADOBUTROL 1 MMOL/ML IV SOLN
7.0000 mL | Freq: Once | INTRAVENOUS | Status: AC | PRN
  Administered 2021-01-27: 7 mL via INTRAVENOUS

## 2021-01-30 ENCOUNTER — Telehealth: Payer: Self-pay | Admitting: *Deleted

## 2021-01-30 ENCOUNTER — Encounter (HOSPITAL_COMMUNITY): Payer: Self-pay

## 2021-01-30 ENCOUNTER — Ambulatory Visit (HOSPITAL_COMMUNITY)
Admission: RE | Admit: 2021-01-30 | Discharge: 2021-01-30 | Disposition: A | Discharge status: Home / Self Care | Payer: Self-pay | Source: Ambulatory Visit | Attending: Adult Health | Admitting: Adult Health

## 2021-01-30 ENCOUNTER — Other Ambulatory Visit: Payer: Self-pay

## 2021-01-30 ENCOUNTER — Encounter: Payer: Self-pay | Admitting: Oncology

## 2021-01-30 DIAGNOSIS — C50411 Malignant neoplasm of upper-outer quadrant of right female breast: Secondary | ICD-10-CM | POA: Insufficient documentation

## 2021-01-30 DIAGNOSIS — Z170 Estrogen receptor positive status [ER+]: Secondary | ICD-10-CM | POA: Insufficient documentation

## 2021-01-30 IMAGING — CT CT CHEST W/ CM
2 of 4 series · 15 of 36 positions shown, 18 images · IV contrast (omnipaque)
Comparison: CT abdomen pelvis [DATE].

CLINICAL DATA: Breast cancer, chemotherapy and radiation therapy
complete.

EXAM:
CT CHEST WITH CONTRAST
TECHNIQUE: Multidetector CT imaging of the chest was performed during
intravenous contrast administration.
CONTRAST:  65mL OMNIPAQUE IOHEXOL 350 MG/ML SOLN

[Series 3: axial st · axial · 0.67mm/px · z∈[-455,-229]mm · 12 of 135 slices shown, 15 images]
[im 11/135  mediastinal]
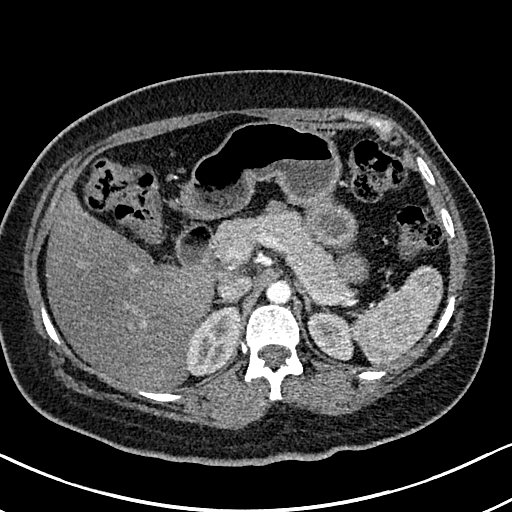
[im 11/135  lung]
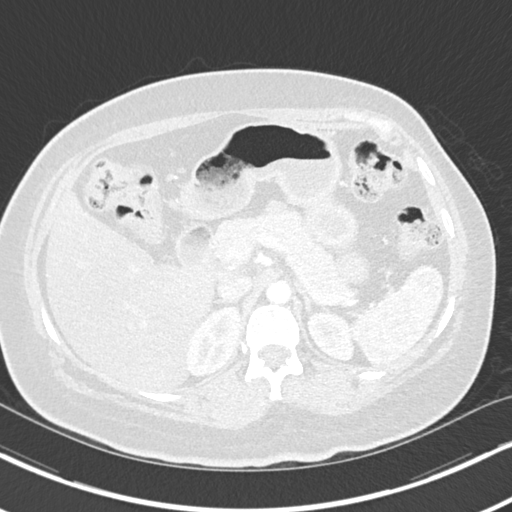
[im 21/135  lung]
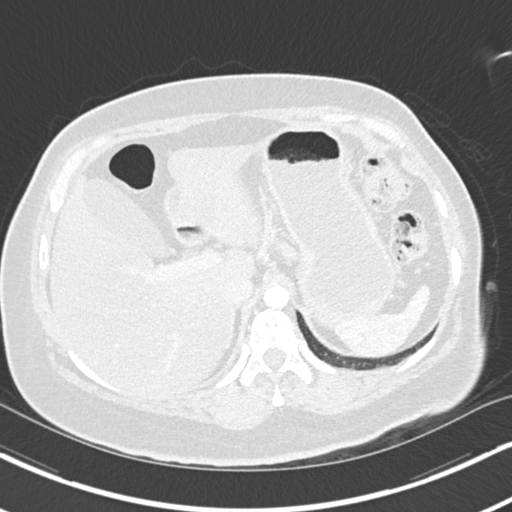
[im 31/135  lung]
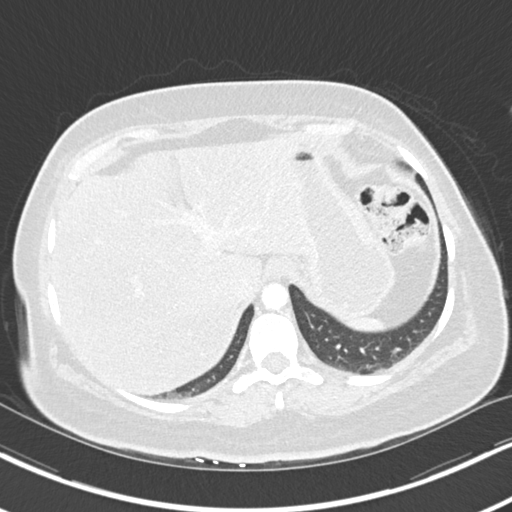
[im 42/135  lung]
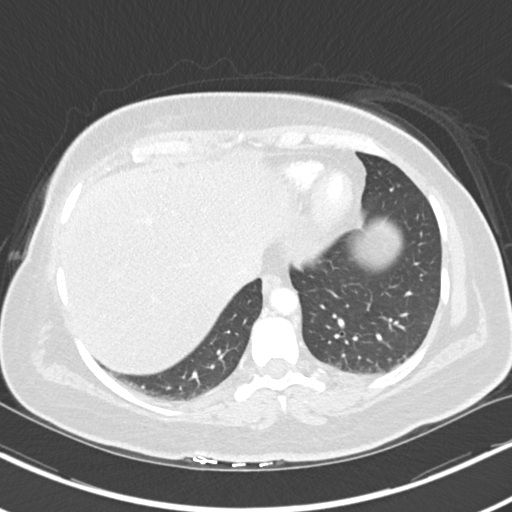
[im 52/135  mediastinal]
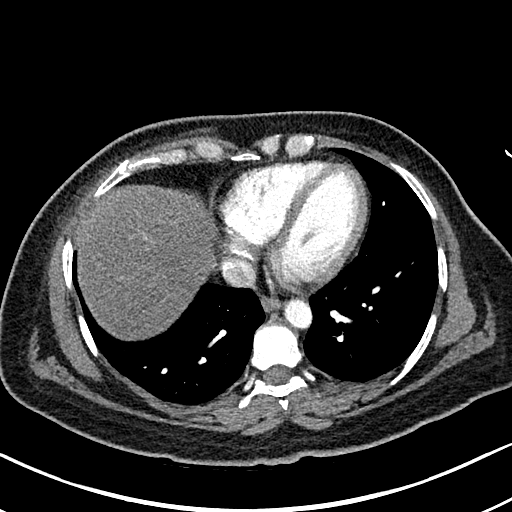
[im 52/135  lung]
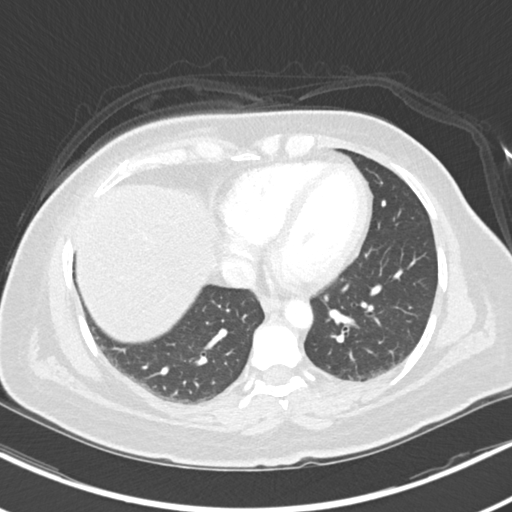
[im 62/135  lung]
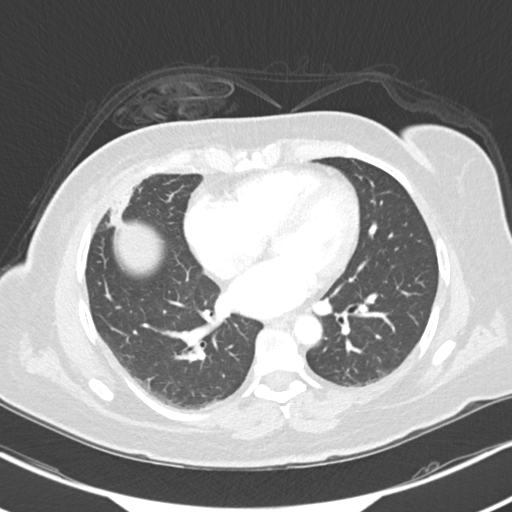
[im 73/135  lung]
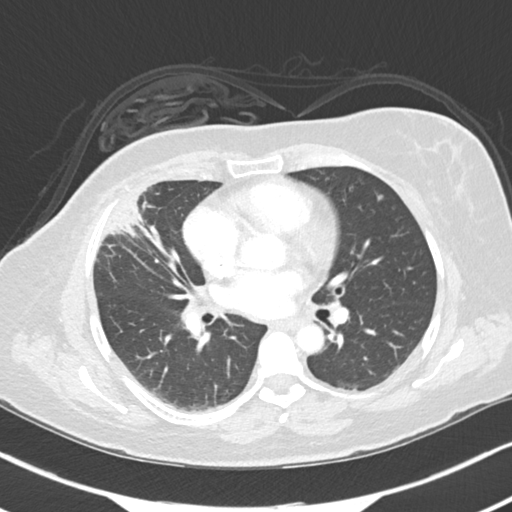
[im 83/135  lung]
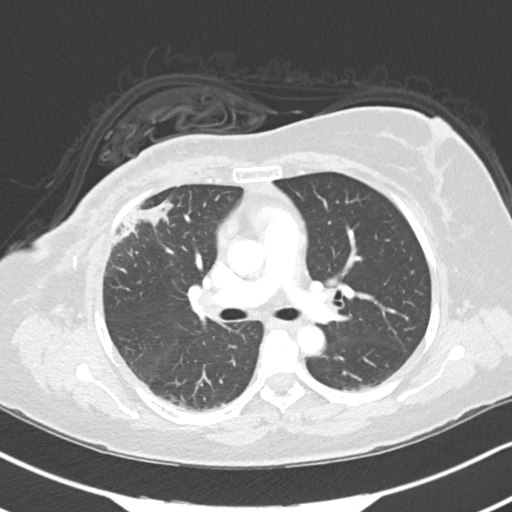
[im 93/135  mediastinal]
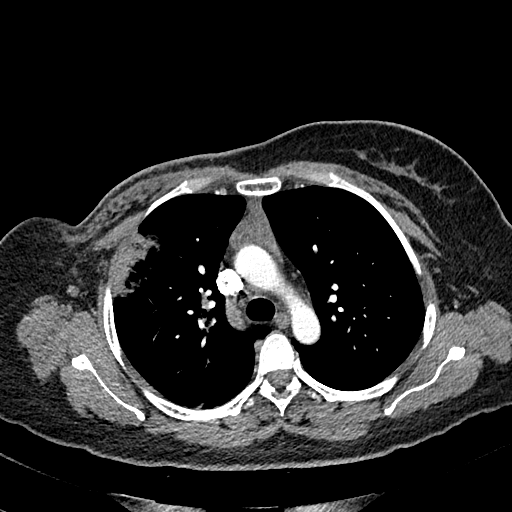
[im 93/135  lung]
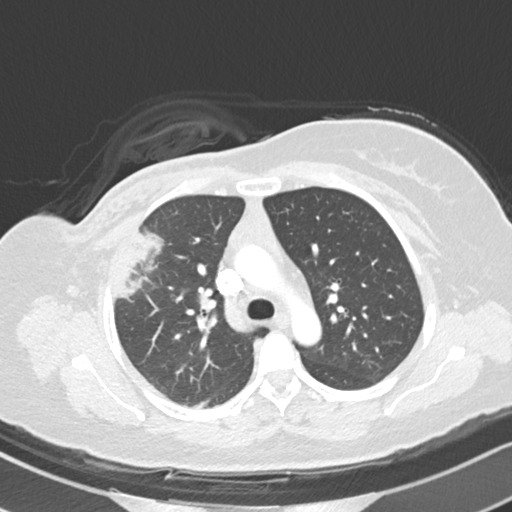
[im 104/135  lung]
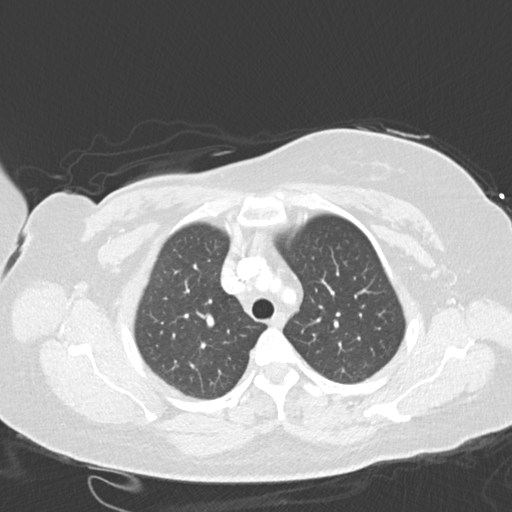
[im 114/135  lung]
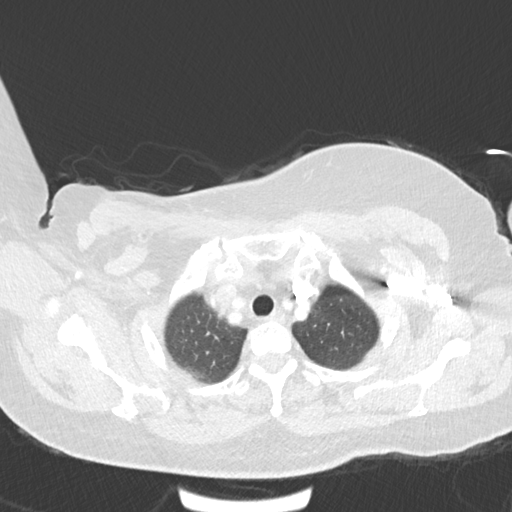
[im 124/135  lung]
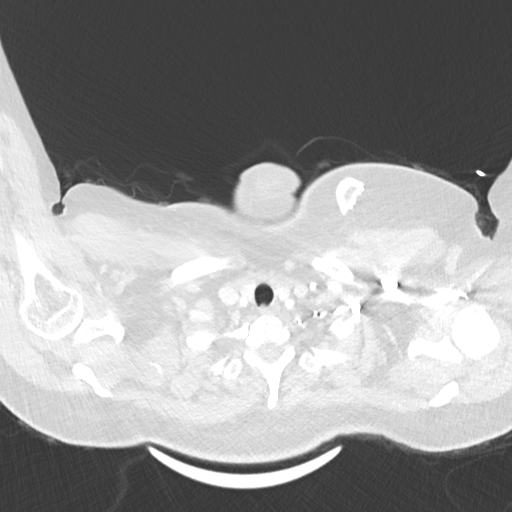

[Series 6: coronal · coronal · 0.59mm/px · 3 of 122 slices shown]
[im 25/122  lung]
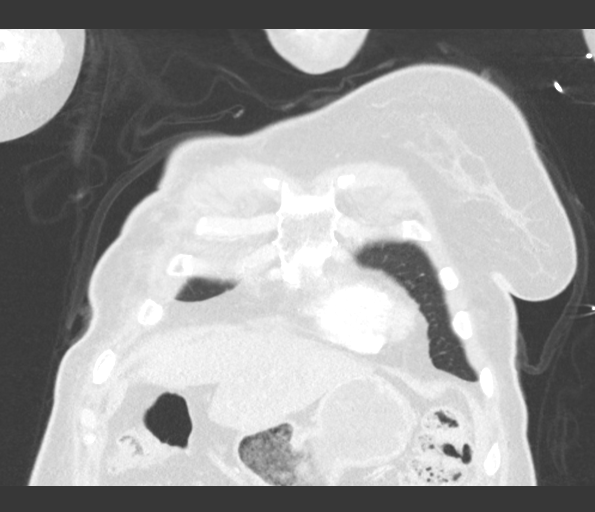
[im 49/122  lung]
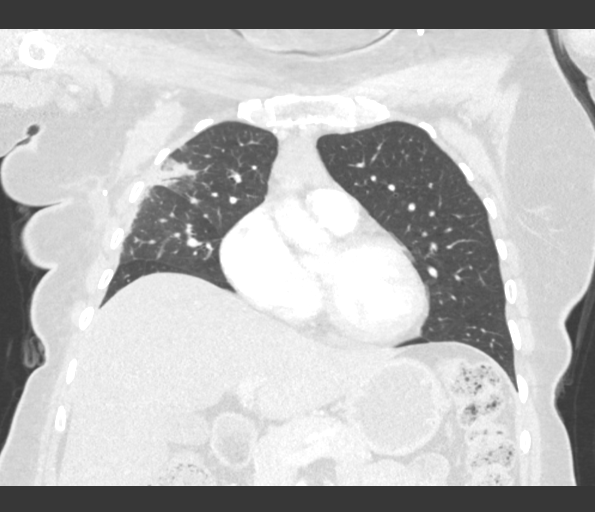
[im 73/122  lung]
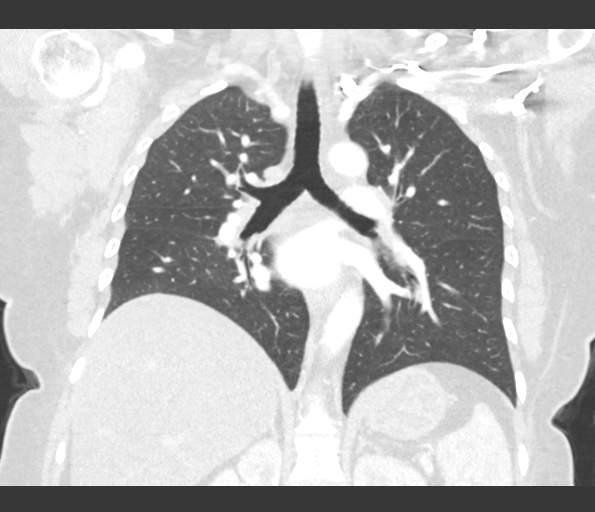

[15 of 36 positions shown; findings below may reference images not displayed]

FINDINGS: Cardiovascular: Left IJ Port-A-Cath terminates at the SVC RA
junction. Heart is at the upper limits of normal in size to mildly
enlarged. No pericardial effusion.

Mediastinum/Nodes: Triangular-shaped soft tissue in the prevascular
space may represent thymic rebound/hyperplasia. No pathologically
enlarged mediastinal, hilar or axillary lymph nodes. Surgical clips
in the right axilla. No internal mammary adenopathy. Esophagus is
grossly unremarkable.

Lungs/Pleura: Patchy subpleural consolidation and bronchiolectasis
in the anterolateral aspect of the right upper and right middle
lobes are presumably radiation related. There are a few scattered
pulmonary nodules measuring up to 4 mm in the lateral right upper
lobe (8/39). No pleural fluid. Airway is unremarkable.

Upper Abdomen: Liver is decreased in attenuation diffusely.
Visualized portions of the gallbladder adrenal glands are
unremarkable. Millimetric low-attenuation lesion in the right
kidney, too small to characterize. Visualized portions of the
kidneys, spleen, pancreas, stomach and bowel are otherwise
unremarkable.

Musculoskeletal: No worrisome lytic or sclerotic lesions.
IMPRESSION: 1. No definitive evidence of metastatic disease.
2. Subpleural radiation treatment changes in the anterior right
lung.
3. Scattered pulmonary nodules measure 4 mm or less in size.
Continued attention on follow-up is recommended.
4. Hepatic steatosis.

## 2021-01-30 IMAGING — CT CT HEAD WO/W CM
5 of 8 series · 17 of 47 positions shown, 18 images · IV contrast (omnipaque)
Comparison: None.

CLINICAL DATA: Breast cancer, staging; persistent headache

EXAM:
CT HEAD WITHOUT AND WITH CONTRAST
TECHNIQUE: Contiguous axial images were obtained from the base of the skull
through the vertex without and with intravenous contrast
CONTRAST:  65mL OMNIPAQUE IOHEXOL 350 MG/ML SOLN

[Series 2: head wo · axial · 0.47mm/px · z∈[-91,-46]mm · 2 of 28 slices shown, 3 images]
[im 10/28  brain]
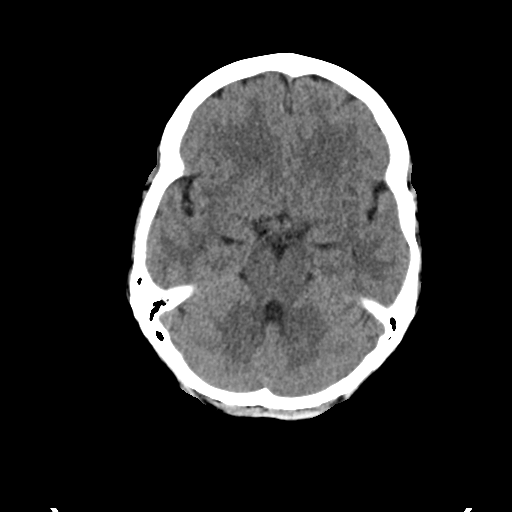
[im 10/28  bone]
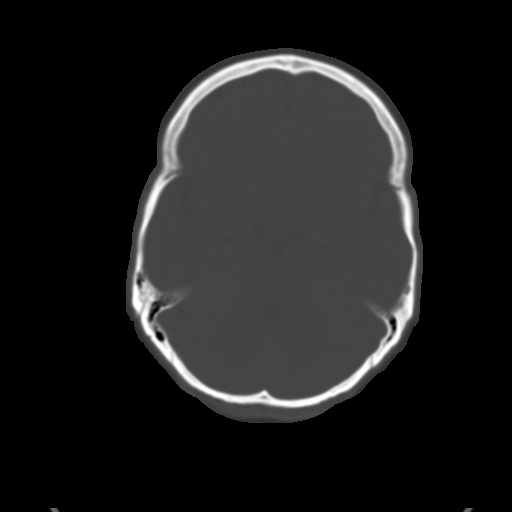
[im 19/28  brain]
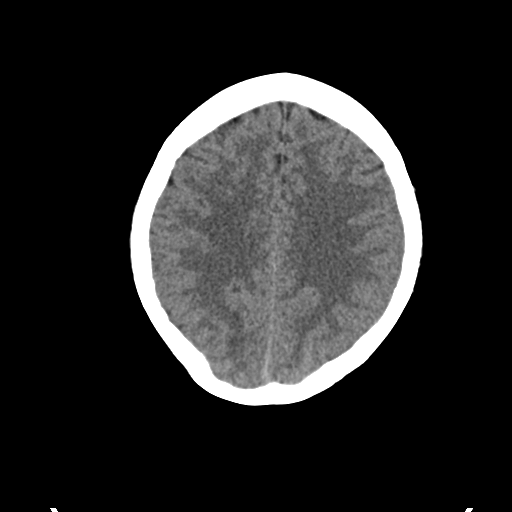

[Series 3: head bone · axial · 0.47mm/px · z∈[-124,-12]mm · 8 of 70 slices shown]
[im 7/70  bone]
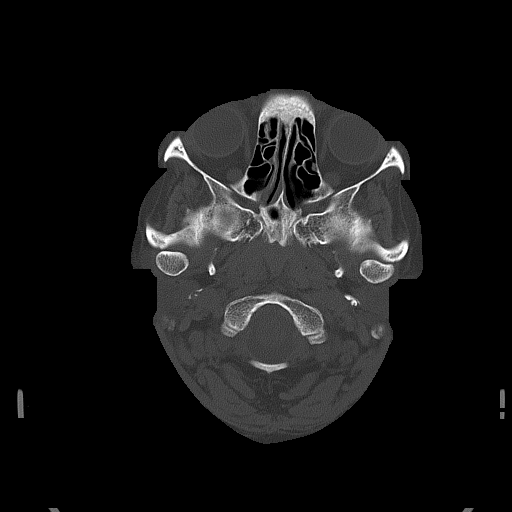
[im 14/70  bone]
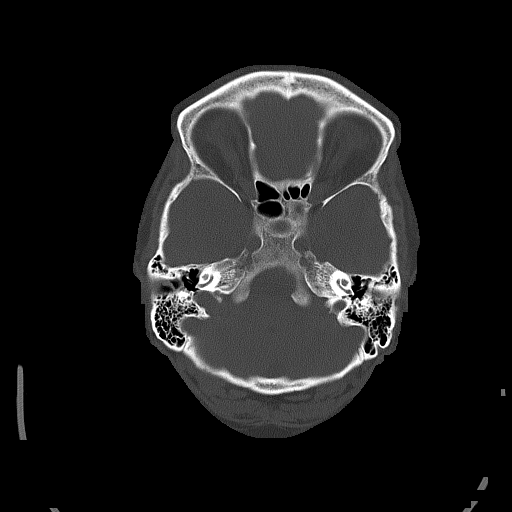
[im 21/70  bone]
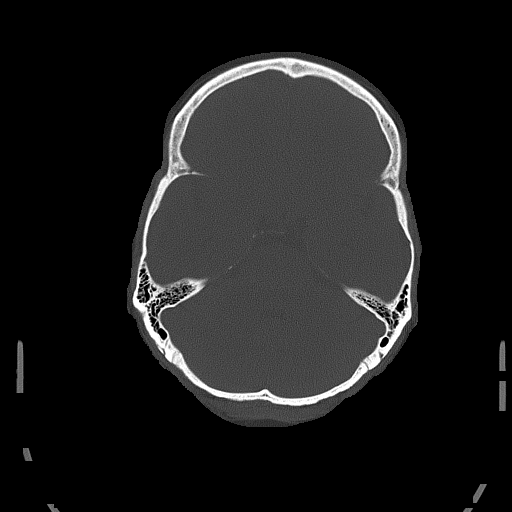
[im 28/70  bone]
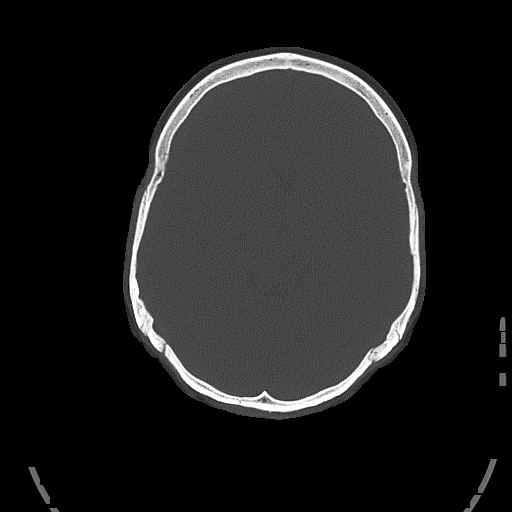
[im 42/70  bone]
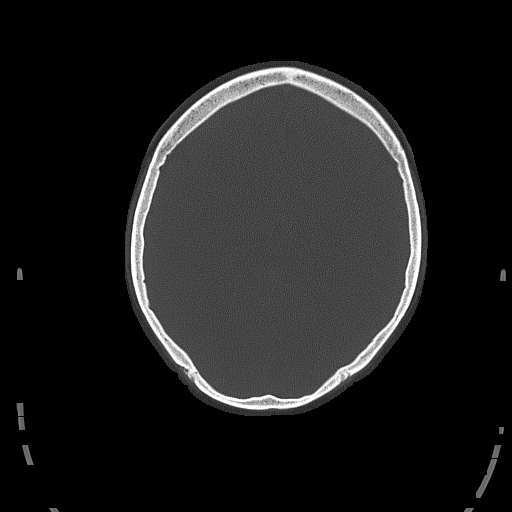
[im 49/70  bone]
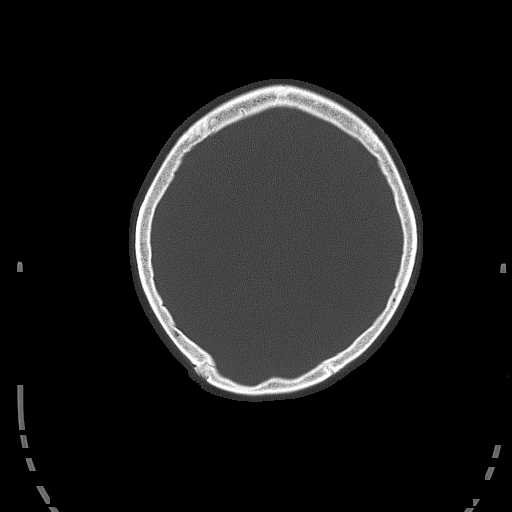
[im 56/70  bone]
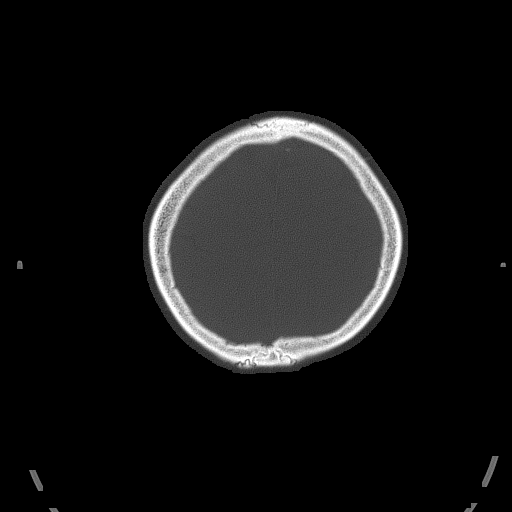
[im 63/70  bone]
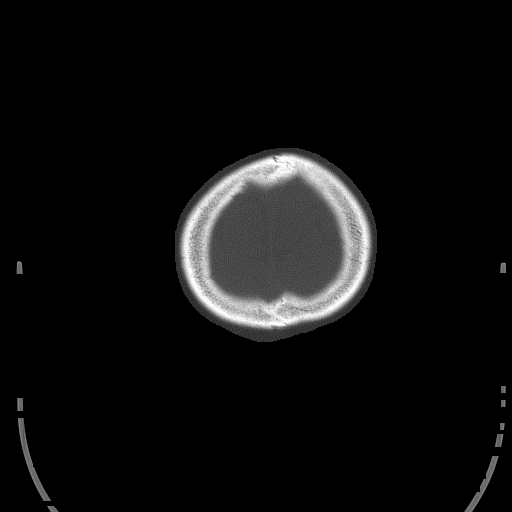

[Series 4: coronal soft tissue · coronal · 0.28mm/px · 3 of 62 slices shown]
[im 16/62  brain]
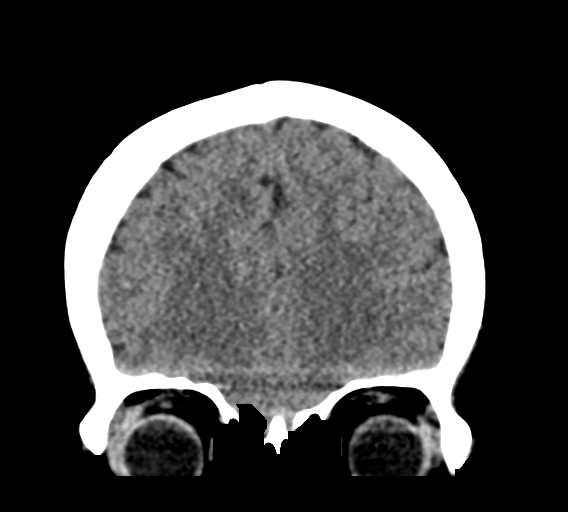
[im 31/62  brain]
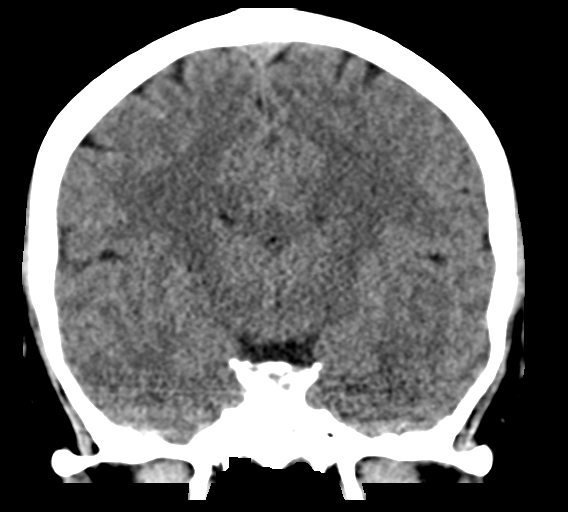
[im 46/62  brain]
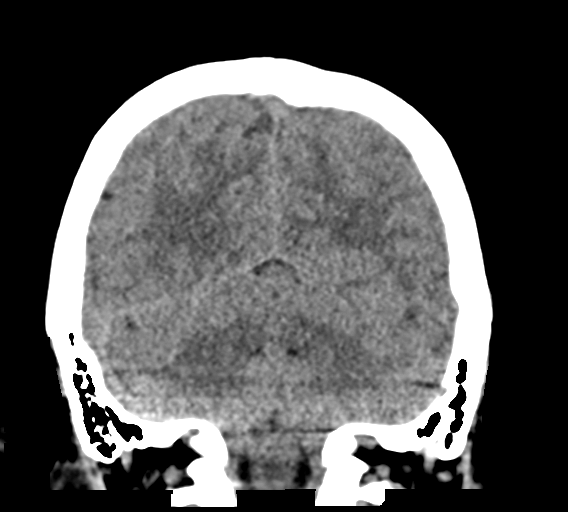

[Series 7: head w/cm · axial · 0.47mm/px · z∈[-88,-44]mm · 2 of 28 slices shown]
[im 10/28  brain]
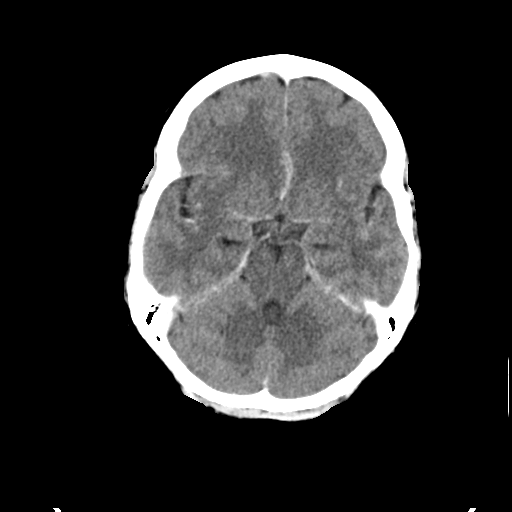
[im 19/28  brain]
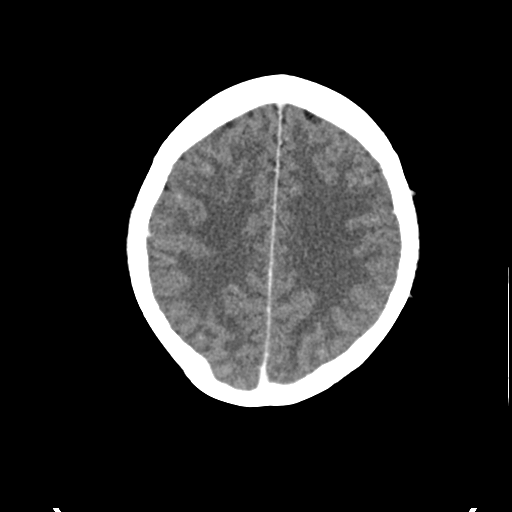

[Series 10: sagittal soft tissue · sagittal · 0.29mm/px · 2 of 61 slices shown]
[im 21/61  brain]
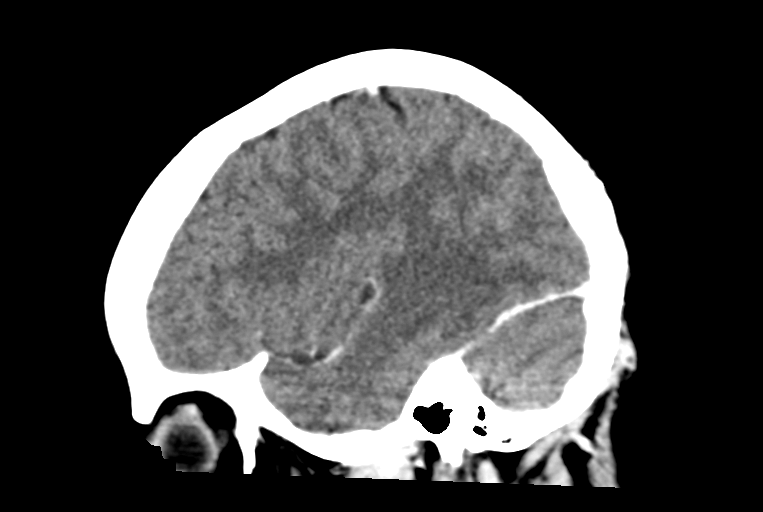
[im 41/61  brain]
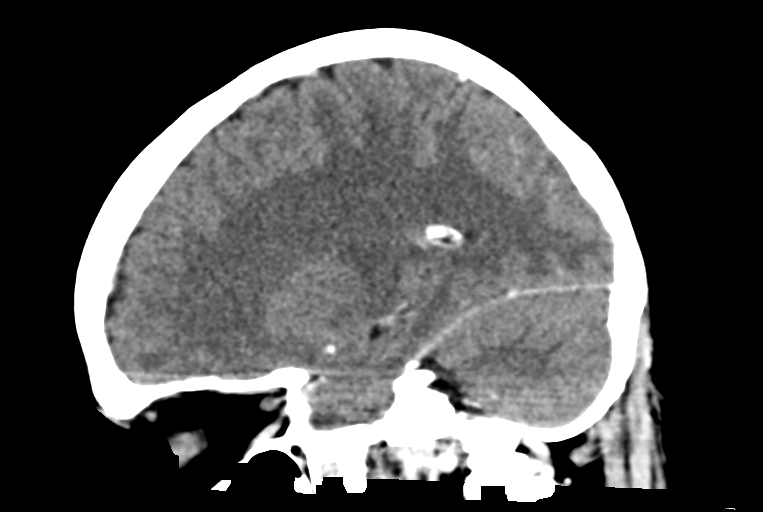

[17 of 47 positions shown; findings below may reference images not displayed]

FINDINGS: Brain: There is no acute intracranial hemorrhage, mass, mass effect,
or edema. No abnormal enhancement. Gray-white differentiation is
preserved. There is no extra-axial fluid collection. Ventricles and
sulci are within normal limits in size and configuration.

Vascular: No hyperdense vessel or unexpected calcification.

Skull: Calvarium is unremarkable.

Sinuses/Orbits: Mild mucosal thickening.  Orbits are unremarkable.

Other: None.
IMPRESSION: No evidence of intracranial metastatic disease.

## 2021-01-30 MED ORDER — IOHEXOL 350 MG/ML SOLN
100.0000 mL | Freq: Once | INTRAVENOUS | Status: AC | PRN
  Administered 2021-01-30: 65 mL via INTRAVENOUS

## 2021-01-30 NOTE — Telephone Encounter (Signed)
Notified via interpreter of MRI results below

## 2021-01-30 NOTE — Telephone Encounter (Signed)
-----   Message from Gardenia Phlegm, NP sent at 01/30/2021  9:55 AM EDT ----- No cancer seen on back MRI.  Good news.   ----- Message ----- From: Interface, Rad Results In Sent: 01/30/2021   9:25 AM EDT To: Gardenia Phlegm, NP

## 2021-02-03 ENCOUNTER — Telehealth: Payer: Self-pay

## 2021-02-03 NOTE — Telephone Encounter (Signed)
-----   Message from Gardenia Phlegm, NP sent at 01/31/2021 10:33 AM EDT ----- Chest ct shows no cancer, great news ----- Message ----- From: Interface, Rad Results In Sent: 01/30/2021   3:39 PM EDT To: Gardenia Phlegm, NP

## 2021-02-03 NOTE — Telephone Encounter (Signed)
Patient notified, verbalized understanding

## 2021-02-07 NOTE — Progress Notes (Signed)
[facility]  [phone]) [phone] [phone]) [phone]     ID: Jill [name] DOB: [birth year]  MR#: [id number]  IPJ#:[id number]  Patient Care Team: Chauncey Cruel, MD as PCP - General (Oncology) Danaija Eskridge, Virgie Dad, MD as Consulting Physician (Oncology) Jovita Kussmaul, MD as Consulting Physician (General Surgery) Dillingham, Loel Lofty, DO as Attending Physician (Plastic Surgery) Mauro Kaufmann, RN as Oncology Nurse Navigator Rockwell Germany, RN as Oncology Nurse Navigator Chauncey Cruel, MD OTHER MD:  CHIEF COMPLAINT: triple positive breast cancer (s/p right mastectomy)  CURRENT TREATMENT: trastuzumab, tamoxifen   INTERVAL HISTORY: Jill [name] returns today for follow up of her triple positive breast cancer.  She is not accompanied by an interpreter  Since her last visit, she underwent total spine screening MRI on 01/27/2021 showing: no specific evidence of osseous metastatic disease.  She also underwent CT chest on 01/30/2021 showing no definite evidence of metastatic disease.  Finally, she also underwent head CT the same day showing no evidence of intracranial metastatic disease.  She continues on trastuzumab.  She has absolutely no side effects from these treatments.  Repeat echo earlier this month continues to show an excellent ejection fraction.  She is already scheduled for an echo early October which will be her final  She is taking tamoxifen daily and tolerates it well.  She denies any significant side effects due to this.     REVIEW OF SYSTEMS: Jill [name] "feels normal".  In addition to doing chores in her home she works doing Education administrator for homes and businesses.  She does a lot of scrubbing activity with her right arm and this causes her some discomfort in the right axilla.  A detailed review of systems today was otherwise entirely benign   COVID 19 VACCINATION STATUS: fully vaccinated [employer]), no booster as of December 2021   HISTORY OF  CURRENT ILLNESS: From the original intake note:  "Jill [name]" herself palpated a right breast abnormality sometime in 2019.,  She did not pay much mind.  This was noted again during her pregnancy and she underwent bilateral diagnostic mammography with tomography and right breast ultrasonography at [facility] on 01/21/2020 showing: breast density category C; 4.1 cm irregular mass with associated group of pleomorphic calcifications measuring approximately 4.9 cm, located at site of palpable concern in upper-outer right breast at 9:30; no right axillary lymphadenopathy.  Accordingly on 02/04/2020 she proceeded to biopsy of the right breast area in question. The pathology from this procedure ([id number]) showed: invasive and in situ mammary carcinoma, e-cadherin positive, grade 2. Prognostic indicators significant for: estrogen receptor, 100% positive and progesterone receptor, 100% positive, both with strong staining intensity. Proliferation marker Ki67 at 20%. HER2 equivocal by immunohistochemistry (2+), but positive by fluorescent in situ hybridization with a signals ratio 5.39 and number per cell 11.05.  The patient's subsequent history is as detailed below.   PAST MEDICAL HISTORY: Past Medical History:  Diagnosis Date   Breast cancer ([place]) 03/07/2020   Infected cyst of Bartholin's gland duct    Medical history non-contributory     PAST SURGICAL HISTORY: Past Surgical History:  Procedure Laterality Date   IR IMAGING GUIDED PORT INSERTION  03/02/2020   MASTECTOMY W/ SENTINEL NODE BIOPSY Right 03/07/2020   Procedure: RIGHT MASTECTOMY WITH SENTINEL LYMPH NODE BIOPSY;  Surgeon: Jovita Kussmaul, MD;  Location: [facility];  Service: General;  Laterality: Right;   NO PAST SURGERIES     WISDOM TOOTH EXTRACTION  FAMILY HISTORY: Family History  Problem Relation Age of Onset   Diabetes Maternal Grandmother    Hypertension Mother   The patient's father is 27 and her mother 23 as  of August 2021.  The patient is 3 brothers and 2 sisters.  There is no history of cancer in the family to her knowledge   GYNECOLOGIC HISTORY:  No LMP recorded. (Menstrual status: IUD). Menarche: 45 years old Age at first live birth: 45 years old [place] P 4 LMP irregular Contraceptive: Mirena IUD in place HRT n/a  Hysterectomy? no BSO? 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  Her significant other Jill [name] works Occupational hygienist.  They are both from [place].  The patient's children are ages 46, 31, 54, and 22 months as of August 2021.  The older 3 children are from a different father and their last name is Jill [name].  (The patient was not married to Mr. Jill [name] so there is no legal issue regarding access to medical records, etc.). The 28-monthold is a child of Jill [name] and Jill [name]  The patient attends a local ELondonDIRECTIVES: Not in place   HEALTH MAINTENANCE: Social History   Tobacco Use   Smoking status: Never   Smokeless tobacco: Never  Vaping Use   Vaping Use: Never used  Substance Use Topics   Alcohol use: Never   Drug use: Never     Colonoscopy: n/a (age)  PAP: 10/2018, negative  Bone density: n/a (age)   No Known Allergies  Current Outpatient Medications  Medication Sig Dispense Refill   amoxicillin-clavulanate (AUGMENTIN) 875-125 MG tablet Take 1 tablet by mouth 2 (two) times daily. 14 tablet 0   gabapentin (NEURONTIN) 300 MG capsule Take 1 capsule (300 mg total) by mouth at bedtime as needed (burning pain). Instructions in spanish 90 capsule 1   ibuprofen (ADVIL) 200 MG tablet Take 400 mg by mouth every 6 (six) hours as needed for headache or moderate pain.     tamoxifen (NOLVADEX) 20 MG tablet Take 1 tablet (20 mg total) by mouth daily. 90 tablet 12   No current facility-administered medications for this visit.    Facility-Administered Medications Ordered in Other Visits  Medication Dose Route Frequency Provider Last Rate Last Admin   sodium chloride flush (NS) 0.9 % injection 10 mL  10 mL Intracatheter Once Shatarra Wehling, GVirgie Dad MD        OBJECTIVE: Spanish speaker who appears well  Vitals:   02/08/21 0836  BP: (!) 113/59  Pulse: 63  Resp: 18  Temp: 97.7 F (36.5 C)  SpO2: 100%      Body mass index is 33.5 kg/m.   Wt Readings from Last 3 Encounters:  02/08/21 160 lb 4.8 oz (72.7 kg)  01/18/21 158 lb 8 oz (71.9 kg)  01/12/21 158 lb 8 oz (71.9 kg)     ECOG FS:1 - Symptomatic but completely ambulatory  Hair is coming in dark slightly curly and thick, with no gaps Sclerae unicteric, EOMs intact Wearing a mask No cervical or supraclavicular adenopathy Lungs no rales or rhonchi Heart regular rate and rhythm Abd soft, nontender, positive bowel sounds MSK no focal spinal tenderness, no upper extremity lymphedema Neuro: nonfocal, well oriented, appropriate affect Breasts: The right breast is status post mastectomy and radiation.  There is some irregularity laterally but no residual or recurrent disease.  The left breast is benign.  Both axillae are benign.   LAB RESULTS:  CMP     Component Value Date/Time   NA 143 01/18/2021 0758   NA 138 12/15/2019 1543   K 4.0 01/18/2021 0758   CL 112 (H) 01/18/2021 0758   CO2 25 01/18/2021 0758   GLUCOSE 92 01/18/2021 0758   BUN 13 01/18/2021 0758   BUN 10 12/15/2019 1543   CREATININE 0.69 01/18/2021 0758   CREATININE 0.65 01/12/2021 1036   CALCIUM 9.2 01/18/2021 0758   PROT 6.6 01/18/2021 0758   PROT 7.3 12/15/2019 1543   ALBUMIN 3.7 01/18/2021 0758   ALBUMIN 4.5 12/15/2019 1543   AST 22 01/18/2021 0758   AST 28 01/12/2021 1036   ALT 17 01/18/2021 0758   ALT 24 01/12/2021 1036   ALKPHOS 74 01/18/2021 0758   BILITOT 0.3 01/18/2021 0758   BILITOT 0.7 01/12/2021 1036   GFRNONAA >60 01/18/2021 0758   GFRNONAA >60 01/12/2021 1036    GFRAA >60 03/28/2020 0813   GFRAA >60 02/22/2020 1353    No results found for: TOTALPROTELP, ALBUMINELP, A1GS, A2GS, BETS, BETA2SER, GAMS, MSPIKE, SPEI  Lab Results  Component Value Date   WBC 3.5 (L) 01/18/2021   NEUTROABS 2.0 01/18/2021   HGB 12.9 01/18/2021   HCT 38.3 01/18/2021   MCV 90.8 01/18/2021   PLT 302 01/18/2021    No results found for: LABCA2  No components found for: UUVOZD664  No results for input(s): INR in the last 168 hours.  No results found for: LABCA2  No results found for: QIH474  No results found for: QVZ563  No results found for: OVF643  No results found for: CA2729  No components found for: HGQUANT  No results found for: CEA1 / No results found for: CEA1   No results found for: AFPTUMOR  No results found for: CHROMOGRNA  No results found for: KPAFRELGTCHN, LAMBDASER, KAPLAMBRATIO (kappa/lambda light chains)  Lab Results  Component Value Date   HGBA 97.0 10/10/2018   (Hemoglobinopathy evaluation)   No results found for: LDH  No results found for: IRON, TIBC, IRONPCTSAT (Iron and TIBC)  No results found for: FERRITIN  Urinalysis    Component Value Date/Time   COLORURINE YELLOW 12/31/2017 2039   APPEARANCEUR CLOUDY (A) 12/31/2017 2039   LABSPEC 1.015 12/31/2017 2039   PHURINE 5.0 12/31/2017 2039   GLUCOSEU NEGATIVE 12/31/2017 2039   HGBUR MODERATE (A) 12/31/2017 2039   BILIRUBINUR NEGATIVE 12/31/2017 2039   KETONESUR NEGATIVE 12/31/2017 2039   PROTEINUR NEGATIVE 12/31/2017 2039   NITRITE NEGATIVE 12/31/2017 2039   LEUKOCYTESUR LARGE (A) 12/31/2017 2039    STUDIES: CT Head W Wo Contrast  Result Date: 01/30/2021 CLINICAL DATA:  Breast cancer, staging; persistent headache EXAM: CT HEAD WITHOUT AND WITH CONTRAST TECHNIQUE: Contiguous axial images were obtained from the base of the skull through the vertex without and with intravenous contrast CONTRAST:  4m OMNIPAQUE IOHEXOL 350 MG/ML SOLN COMPARISON:  None. FINDINGS: Brain:  There is no acute intracranial hemorrhage, mass, mass effect, or edema. No abnormal enhancement. Gray-white differentiation is preserved. There is no extra-axial fluid collection. Ventricles and sulci are within normal limits in size and configuration. Vascular: No hyperdense vessel or unexpected calcification. Skull: Calvarium is unremarkable. Sinuses/Orbits: Mild mucosal thickening.  Orbits are unremarkable. Other: None. IMPRESSION: No evidence of intracranial metastatic disease. Electronically Signed   By: PMacy MisM.D.   On: 01/30/2021 10:56   CT Chest W Contrast  Result Date: 01/30/2021 CLINICAL DATA:  Breast cancer, chemotherapy and radiation therapy complete. EXAM: CT CHEST WITH CONTRAST TECHNIQUE: Multidetector CT imaging of the chest was performed during intravenous contrast administration. CONTRAST:  44m OMNIPAQUE IOHEXOL 350 MG/ML SOLN COMPARISON:  CT abdomen pelvis 09/06/2020. FINDINGS: Cardiovascular: Left IJ Port-A-Cath terminates at the SVC RA junction. Heart is at the upper limits of normal in size to mildly enlarged. No pericardial effusion. Mediastinum/Nodes: Triangular-shaped soft tissue in the prevascular space may represent thymic rebound/hyperplasia. No pathologically enlarged mediastinal, hilar or axillary lymph nodes. Surgical clips in the right axilla. No internal mammary adenopathy. Esophagus is grossly unremarkable. Lungs/Pleura: Patchy subpleural consolidation and bronchiolectasis in the anterolateral aspect of the right upper and right middle lobes are presumably radiation related. There are a few scattered pulmonary nodules measuring up to 4 mm in the lateral right upper lobe (8/39). No pleural fluid. Airway is unremarkable. Upper Abdomen: Liver is decreased in attenuation diffusely. Visualized portions of the gallbladder adrenal glands are unremarkable. Millimetric low-attenuation lesion in the right kidney, too small to characterize. Visualized portions of the kidneys,  spleen, pancreas, stomach and bowel are otherwise unremarkable. Musculoskeletal: No worrisome lytic or sclerotic lesions. IMPRESSION: 1. No definitive evidence of metastatic disease. 2. Subpleural radiation treatment changes in the anterior right lung. 3. Scattered pulmonary nodules measure 4 mm or less in size. Continued attention on follow-up is recommended. 4. Hepatic steatosis. Electronically Signed   By: MLorin PicketM.D.   On: 01/30/2021 15:36   [place]BREAST LTD UNI LEFT INC AXILLA  Result Date: 01/10/2021 CLINICAL DATA:  45year old female with diffuse pain in the UPPER and OUTER LEFT breast, noticed after Port-A-Cath placement in September. History of RIGHT breast cancer and RIGHT mastectomy. EXAM: DIGITAL DIAGNOSTIC UNILATERAL LEFT MAMMOGRAM WITH TOMOSYNTHESIS AND CAD; ULTRASOUND LEFT BREAST LIMITED TECHNIQUE: Left digital diagnostic mammography and breast tomosynthesis was performed. The images were evaluated with computer-aided detection.; Targeted ultrasound examination of the left breast was performed COMPARISON:  Previous exam(s). ACR Breast Density Category b: There are scattered areas of fibroglandular density. FINDINGS: Full field views of the LEFT breast demonstrate no suspicious mass, distortion or worrisome calcifications. Portion of the patient's port is visualized. Targeted ultrasound is performed, showing no sonographic abnormalities within the UPPER-OUTER LEFT breast except for the patient's Port-A-Cath. No abnormal fluid adjacent to the Port-A-Cath is noted. IMPRESSION: 1. No findings to suggest a cause for this patient's diffuse LEFT breast pain. 2. No mammographic evidence of breast malignancy. 3. LEFT Port-A-Cath without adjacent collection/fluid. RECOMMENDATION: LEFT screening mammogram in 1 year. I have discussed the findings and recommendations with the patient. If applicable, a reminder letter will be sent to the patient regarding the next appointment. BI-RADS CATEGORY  1:  Negative. Electronically Signed   By: JMargarette CanadaM.D.   On: 01/10/2021 11:04  MR TOTAL SPINE METS SCREENING  Result Date: 01/30/2021 CLINICAL DATA:  Breast cancer, assess treatment response Breast cancer, staging EXAM: MRI TOTAL SPINE WITHOUT AND WITH CONTRAST TECHNIQUE: Multisequence MR imaging of the spine from the cervical spine to the sacrum was performed prior to and following IV contrast administration for evaluation of spinal metastatic disease. Limited metastatic screening protocol performed. CONTRAST:  711mGADAVIST GADOBUTROL 1 MMOL/ML IV SOLN COMPARISON:  MRI of the lumbar spine September 24, 2020. FINDINGS: MRI CERVICAL SPINE FINDINGS Alignment: Straightening of the normal cervical lordosis without sagittal subluxation. Vertebrae: No abnormal marrow signal to suggest metastatic disease, acute fracture, or discitis/osteomyelitis. Postcontrast imaging is significantly limited by artifact, particularly superiorly. Cord: No obvious cord  signal abnormality on sagittal imaging. Posterior Fossa, vertebral arteries, paraspinal tissues: Vertebral artery flow voids are not assessed on this study due to limited protocol. The inferior visualized posterior fossa is unremarkable on limited sagittal assessment. Disc levels: Limited evaluation due to limited protocol. Small disc bulge at C5-C6 partially effaces ventral CSF without significant canal stenosis. Limited evaluation of the foramina without axial imaging. No findings to suggest high-grade foraminal stenosis on sagittal imaging. MRI THORACIC SPINE FINDINGS Alignment:  Normal. Vertebrae: No abnormal marrow signal to suggest metastatic disease, acute fracture, or discitis/osteomyelitis. No convincing abnormal enhancement. Artifact at the edge of imaging in the mid/lower thoracic spine on postcontrast imaging. Cord: No obvious cord signal abnormality on sagittal imaging. No evidence of abnormal enhancement. Paraspinal and other soft tissues: No paravertebral edema.  Please see same day CT chest for intrathoracic evaluation. Disc levels: No significant canal stenosis. Mild disc height loss at T4-T5 and T5-T6. No high-grade bony foraminal stenosis. MRI LUMBAR SPINE FINDINGS Segmentation:  Standard. Alignment: Grade 1 anterolisthesis of L5 on S1. Otherwise, no substantial sagittal subluxation. Vertebrae: No abnormal marrow signal to suggest metastatic disease, acute fracture, or discitis/osteomyelitis. No evidence of osseous enhancement in the lumbar spine. Mild perifacet enhancement at L5-S1, likely degenerative given degenerative changes at this level (see below). Conus medullaris: Extends to the superior L2 level and appears normal. No evidence of abnormal enhancement of the conus or cauda equina on sagittal imaging. Paraspinal and other soft tissues: Mild edema in the posterior paraspinal subcutaneous fat, physiologic. Mild perifacet inflammatory changes at L5-S1. possible adnexal cyst, incompletely characterized on this limited study. 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 Similar mild bilateral foraminal stenosis at L5-S1. IMPRESSION: 1. No specific evidence of osseous metastatic disease in the cervical, thoracic, or lumbar spine on this limited screening protocol MRI. 2. 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 Electronically Signed   By: Margaretha Sheffield MD   On: 01/30/2021 09:22   MS DIGITAL DIAG TOMO UNI LEFT  Result Date: 01/10/2021 CLINICAL DATA:  45 year old female with diffuse pain in the UPPER and OUTER LEFT breast, noticed after Port-A-Cath placement in September. History of RIGHT breast cancer and RIGHT mastectomy. EXAM: DIGITAL DIAGNOSTIC UNILATERAL LEFT MAMMOGRAM WITH TOMOSYNTHESIS AND CAD; ULTRASOUND LEFT BREAST LIMITED TECHNIQUE: Left digital diagnostic mammography and breast tomosynthesis was  performed. The images were evaluated with computer-aided detection.; Targeted ultrasound examination of the left breast was performed COMPARISON:  Previous exam(s). ACR Breast Density Category b: There are scattered areas of fibroglandular density. FINDINGS: Full field views of the LEFT breast demonstrate no suspicious mass, distortion or worrisome calcifications. Portion of the patient's port is visualized. Targeted ultrasound is performed, showing no sonographic abnormalities within the UPPER-OUTER LEFT breast except for the patient's Port-A-Cath. No abnormal fluid adjacent to the Port-A-Cath is noted. IMPRESSION: 1. No findings to suggest a cause for this patient's diffuse LEFT breast pain. 2. No mammographic evidence of breast malignancy. 3. LEFT Port-A-Cath without adjacent collection/fluid. RECOMMENDATION: LEFT screening mammogram in 1 year. I have discussed the findings and recommendations with the patient. If applicable, a reminder letter will be sent to the patient regarding the next appointment. BI-RADS CATEGORY  1: Negative. Electronically Signed   By: Margarette Canada M.D.   On: 01/10/2021 11:04  LONG TERM MONITOR (3-14 DAYS)  Result Date: 01/13/2021 Patch Wear Time:  6 days  and 22 hours (2022-07-11T11:28:46-0400 to 2022-07-18T10:23:46-0400) 1. Sinus rhythm -  avg HR of 83 bpm. 2. Rare PACs and PVCs. 3. No patient-triggered events Jill Bickers, MD 6:06 PM   VAS [place] UPPER EXTREMITY VENOUS DUPLEX  Result Date: 01/13/2021 UPPER VENOUS STUDY  Patient Name:  Jill [name]  Date of Exam:   01/13/2021 Medical Rec #: [id number]                         Accession #:    [id number] Date of Birth: 1975/07/23                        Patient Gender: F Patient Age:   51Y Exam Location:  [facility] Procedure:      VAS [place] UPPER EXTREMITY VENOUS DUPLEX Referring Phys: [id number] LINDSEY CORNETTO CAUSEY --------------------------------------------------------------------------------  Indications:  Pain Risk Factors: Cancer. Comparison Study: No prior studies. Performing Technologist: Oliver Hum RVT  Examination Guidelines: A complete evaluation includes B-mode imaging, spectral Doppler, color Doppler, and power Doppler as needed of all accessible portions of each vessel. Bilateral testing is considered an integral part of a complete examination. Limited examinations for reoccurring indications may be performed as noted.  Right Findings: +----------+------------+---------+-----------+----------+-------+ RIGHT     CompressiblePhasicitySpontaneousPropertiesSummary +----------+------------+---------+-----------+----------+-------+ IJV           Full       Yes       Yes                      +----------+------------+---------+-----------+----------+-------+ Subclavian    Full       Yes       Yes                      +----------+------------+---------+-----------+----------+-------+ Axillary      Full       Yes       Yes                      +----------+------------+---------+-----------+----------+-------+ Brachial      Full       Yes       Yes                      +----------+------------+---------+-----------+----------+-------+ Radial        Full                                          +----------+------------+---------+-----------+----------+-------+ Ulnar         Full                                          +----------+------------+---------+-----------+----------+-------+ Cephalic      Full                                          +----------+------------+---------+-----------+----------+-------+ Basilic       Full                                          +----------+------------+---------+-----------+----------+-------+  Left Findings: +----------+------------+---------+-----------+----------+-------+ LEFT      CompressiblePhasicitySpontaneousPropertiesSummary +----------+------------+---------+-----------+----------+-------+  Subclavian    Full       Yes       Yes                      +----------+------------+---------+-----------+----------+-------+  Summary:  Right: No evidence of deep vein thrombosis in the upper extremity. No evidence of superficial vein thrombosis in the upper extremity.  Left: No evidence of thrombosis in the subclavian.  *See table(s) above for measurements and observations.  Diagnosing physician: Jamelle Haring Electronically signed by Jamelle Haring on 01/13/2021 at 4:43:13 PM.    Final      ELIGIBLE FOR AVAILABLE RESEARCH PROTOCOL: AET  ASSESSMENT: 45 y.o. [place] woman status post right breast upper outer quadrant biopsy 02/04/2020 for a clinical T2 N0, stage IB invasive ductal carcinoma, grade 2, triple positive, with an MIB-1 of 20%  (1) status post right mastectomy and sentinel lymph node sampling 03/07/2020 for a pT2 pN1, stage IB invasive ductal carcinoma, grade 2, with negative margins.  (a) a total of 4 lymph nodes were removed, one positive  (2) adjuvant chemo immunotherapy will consist of carboplatin, docetaxel, trastuzumab and pertuzumab every 21 days x 6 starting 03/29/2020, completed 07/18/2020  (a) pertuzumab discontinued after cycle 2 secondary to diarrhea  (3) trastuzumab continuing to complete a year (last dose 03/21/2021)  (a) echo 03/18/2020 shows an ejection fraction in the 60-65% range  (b) echo 06/29/2020 showed an EF in the 65-70% range  (c) echo 09/28/2020 showed an EF of 55-60%  (d) echo 01/02/2021 shows an ejection fraction in the 60-65% range  (e) echo 03/27/2021  (4) adjuvant radiation 08/17/2020 through 10/03/2020 Site Technique Total Dose (Gy) Dose per Fx (Gy) Completed Fx Beam Energies  Chest Wall, Right: CW_Rt 3D 50.4/50.4 1.8 28/28 6X, 10X  Chest Wall, Right: CW_Rt_SCLV 3D 50.4/50.4 1.8 28/28 6X, 10X  Chest Wall, Right: CW_Rt_Bst Electron 10/10 2 5/5 6E   (5) genetics testing 03/02/2020 through the [facility] Common Hereditary Cancers Panel. . The  Common Hereditary Cancers Panel found no deleterious mutations in APC, ATM, AXIN2, BARD1, BMPR1A, BRCA1, BRCA2, BRIP1, CDH1, CDK4, CDKN2A (p14ARF), CDKN2A (p16INK4a), CHEK2, CTNNA1, DICER1, EPCAM (Deletion/duplication testing only), GREM1 (promoter region deletion/duplication testing only), KIT, MEN1, MLH1, MSH2, MSH3, MSH6, MUTYH, NBN, NF1, NHTL1, PALB2, PDGFRA, PMS2, POLD1, POLE, PTEN, RAD50, RAD51C, RAD51D, RNF43, SDHB, SDHC, SDHD, SMAD4, SMARCA4. STK11, TP53, TSC1, TSC2, and VHL.  The following genes were evaluated for sequence changes only: SDHA and HOXB13 c.251G>A variant only.   (a)  a Variant of uncertain significance (VUS) in DICER1 at c.3380T>G (p.Ile1127Ser) was noted  (6) tamoxifen started 02/12/2020 in anticipation of possible surgical delays, held during chemotherapy, resumed February 2022  (7) restaging studies:  (A) head CT with and without contrast 01/30/2021 showed no metastatic disease  (B) chest CT 01/30/2021 shows no evidence of metastatic disease.  There is hepatic steatosis, radiation changes right lung, scattered right upper lobe nodules all less than 0.5 cm  (C) MRI of the total spine 01/27/2021 shows no evidence of metastatic disease.  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  She will have her port removed sometime in October.  She has had an excellent preserved cardiac function and she has 1 more echo scheduled early October for follow-up of that.  I have written  her prescription for bras and prostheses.  She tells me she is planning to go to [facility] tomorrow to start getting fitted.  We reviewed the results of her staging scans which are all very favorable.  She does have some fatty liver and we discussed avoiding carbs long-term, treating this just as one would prediabetes.  I will see her again with her final trastuzumab dose.  She knows  to call for any other issue that may develop before then.  Total encounter time 25 minutes.Jill Jews C. Prudencio Velazco, MD 02/08/21 8:52 AM Medical Oncology and Hematology [facility] [place], [place] [address] Tel. [phone]    Fax. [phone]   I, Wilburn Mylar, am acting as scribe for Dr. Virgie Dad. Launa Goedken.  I, Lurline Del MD, have reviewed the above documentation for accuracy and completeness, and I agree with the above.   *Total Encounter Time as defined by the Centers for Medicare and Medicaid Services includes, in addition to the face-to-face time of a patient visit (documented in the note above) non-face-to-face time: obtaining and reviewing outside history, ordering and reviewing medications, tests or procedures, care coordination (communications with other health care professionals or caregivers) and documentation in the medical record.

## 2021-02-08 ENCOUNTER — Other Ambulatory Visit: Payer: Self-pay

## 2021-02-08 ENCOUNTER — Other Ambulatory Visit: Payer: Self-pay | Admitting: Oncology

## 2021-02-08 ENCOUNTER — Inpatient Hospital Stay: Payer: No Typology Code available for payment source

## 2021-02-08 ENCOUNTER — Inpatient Hospital Stay: Payer: No Typology Code available for payment source | Attending: Oncology

## 2021-02-08 ENCOUNTER — Inpatient Hospital Stay (HOSPITAL_BASED_OUTPATIENT_CLINIC_OR_DEPARTMENT_OTHER): Payer: Self-pay | Admitting: Oncology

## 2021-02-08 VITALS — BP 113/59 | HR 63 | Temp 97.7°F | Resp 18 | Ht <= 58 in | Wt 160.3 lb

## 2021-02-08 DIAGNOSIS — K760 Fatty (change of) liver, not elsewhere classified: Secondary | ICD-10-CM

## 2021-02-08 DIAGNOSIS — C7951 Secondary malignant neoplasm of bone: Secondary | ICD-10-CM | POA: Insufficient documentation

## 2021-02-08 DIAGNOSIS — Z79899 Other long term (current) drug therapy: Secondary | ICD-10-CM | POA: Insufficient documentation

## 2021-02-08 DIAGNOSIS — C50411 Malignant neoplasm of upper-outer quadrant of right female breast: Secondary | ICD-10-CM

## 2021-02-08 DIAGNOSIS — Z170 Estrogen receptor positive status [ER+]: Secondary | ICD-10-CM

## 2021-02-08 DIAGNOSIS — Z9221 Personal history of antineoplastic chemotherapy: Secondary | ICD-10-CM | POA: Insufficient documentation

## 2021-02-08 DIAGNOSIS — Z5112 Encounter for antineoplastic immunotherapy: Secondary | ICD-10-CM | POA: Insufficient documentation

## 2021-02-08 DIAGNOSIS — Z95828 Presence of other vascular implants and grafts: Secondary | ICD-10-CM

## 2021-02-08 DIAGNOSIS — Z9011 Acquired absence of right breast and nipple: Secondary | ICD-10-CM | POA: Insufficient documentation

## 2021-02-08 DIAGNOSIS — M5136 Other intervertebral disc degeneration, lumbar region: Secondary | ICD-10-CM

## 2021-02-08 DIAGNOSIS — Z923 Personal history of irradiation: Secondary | ICD-10-CM | POA: Insufficient documentation

## 2021-02-08 DIAGNOSIS — C773 Secondary and unspecified malignant neoplasm of axilla and upper limb lymph nodes: Secondary | ICD-10-CM | POA: Insufficient documentation

## 2021-02-08 LAB — COMPREHENSIVE METABOLIC PANEL
ALT: 24 U/L (ref 0–44)
AST: 24 U/L (ref 15–41)
Albumin: 3.7 g/dL (ref 3.5–5.0)
Alkaline Phosphatase: 83 U/L (ref 38–126)
Anion gap: 9 (ref 5–15)
BUN: 8 mg/dL (ref 6–20)
CO2: 26 mmol/L (ref 22–32)
Calcium: 9 mg/dL (ref 8.9–10.3)
Chloride: 109 mmol/L (ref 98–111)
Creatinine, Ser: 0.64 mg/dL (ref 0.44–1.00)
GFR, Estimated: >60 mL/min (ref >60)
Glucose, Bld: 106 mg/dL — ABNORMAL HIGH (ref 70–99)
Potassium: 3.9 mmol/L (ref 3.5–5.1)
Sodium: 144 mmol/L (ref 135–145)
Total Bilirubin: 0.2 mg/dL — ABNORMAL LOW (ref 0.3–1.2)
Total Protein: 6.4 g/dL — ABNORMAL LOW (ref 6.5–8.1)

## 2021-02-08 LAB — CBC WITH DIFFERENTIAL/PLATELET
Abs Immature Granulocytes: 0.01 10*3/uL (ref 0.00–0.07)
Basophils Absolute: 0 10*3/uL (ref 0.0–0.1)
Basophils Relative: 1 %
Eosinophils Absolute: 0.1 10*3/uL (ref 0.0–0.5)
Eosinophils Relative: 3 %
HCT: 39.5 % (ref 36.0–46.0)
Hemoglobin: 13.3 g/dL (ref 12.0–15.0)
Immature Granulocytes: 0 %
Lymphocytes Relative: 36 %
Lymphs Abs: 1.1 10*3/uL (ref 0.7–4.0)
MCH: 30.8 pg (ref 26.0–34.0)
MCHC: 33.7 g/dL (ref 30.0–36.0)
MCV: 91.4 fL (ref 80.0–100.0)
Monocytes Absolute: 0.2 10*3/uL (ref 0.1–1.0)
Monocytes Relative: 8 %
Neutro Abs: 1.6 10*3/uL — ABNORMAL LOW (ref 1.7–7.7)
Neutrophils Relative %: 52 %
Platelets: 301 10*3/uL (ref 150–400)
RBC: 4.32 MIL/uL (ref 3.87–5.11)
RDW: 11.9 % (ref 11.5–15.5)
WBC: 3.1 10*3/uL — ABNORMAL LOW (ref 4.0–10.5)
nRBC: 0 % (ref 0.0–0.2)

## 2021-02-08 MED ORDER — TRASTUZUMAB-ANNS CHEMO 150 MG IV SOLR
6.0000 mg/kg | Freq: Once | INTRAVENOUS | Status: AC
  Administered 2021-02-08: 420 mg via INTRAVENOUS
  Filled 2021-02-08: qty 20

## 2021-02-08 MED ORDER — ACETAMINOPHEN 325 MG PO TABS
650.0000 mg | ORAL_TABLET | Freq: Once | ORAL | Status: AC
  Administered 2021-02-08: 650 mg via ORAL
  Filled 2021-02-08: qty 2

## 2021-02-08 MED ORDER — SODIUM CHLORIDE 0.9% FLUSH
10.0000 mL | INTRAVENOUS | Status: DC | PRN
  Administered 2021-02-08: 10 mL

## 2021-02-08 MED ORDER — HEPARIN SOD (PORK) LOCK FLUSH 100 UNIT/ML IV SOLN
500.0000 [IU] | Freq: Once | INTRAVENOUS | Status: AC | PRN
  Administered 2021-02-08: 500 [IU]

## 2021-02-08 MED ORDER — SODIUM CHLORIDE 0.9% FLUSH
10.0000 mL | Freq: Once | INTRAVENOUS | Status: AC
  Administered 2021-02-08: 10 mL

## 2021-02-08 MED ORDER — DIPHENHYDRAMINE HCL 25 MG PO CAPS
25.0000 mg | ORAL_CAPSULE | Freq: Once | ORAL | Status: AC
  Administered 2021-02-08: 25 mg via ORAL
  Filled 2021-02-08: qty 1

## 2021-02-08 MED ORDER — SODIUM CHLORIDE 0.9 % IV SOLN: Freq: Once | INTRAVENOUS | Status: AC

## 2021-02-08 NOTE — Patient Instructions (Addendum)
[facility] MEDICAL ONCOLOGY  Discharge Instructions: Thank you for choosing [facility] to provide your oncology and hematology care.   If you have a lab appointment with the Cancer Center, please go directly to the Cancer Center and check in at the registration area.   Wear comfortable clothing and clothing appropriate for easy access to any Portacath or PICC line.   We strive to give you quality time with your provider. You may need to reschedule your appointment if you arrive late (15 or more minutes).  Arriving late affects you and other patients whose appointments are after yours.  Also, if you miss three or more appointments without notifying the office, you may be dismissed from the clinic at the provider's discretion.      For prescription refill requests, have your pharmacy contact our office and allow 72 hours for refills to be completed.    Today you received the following chemotherapy and/or immunotherapy agents trastuzumab      To help prevent nausea and vomiting after your treatment, we encourage you to take your nausea medication as directed.  BELOW ARE SYMPTOMS THAT SHOULD BE REPORTED IMMEDIATELY: *FEVER GREATER THAN 100.4 F (38 C) OR HIGHER *CHILLS OR SWEATING *NAUSEA AND VOMITING THAT IS NOT CONTROLLED WITH YOUR NAUSEA MEDICATION *UNUSUAL SHORTNESS OF BREATH *UNUSUAL BRUISING OR BLEEDING *URINARY PROBLEMS (pain or burning when urinating, or frequent urination) *BOWEL PROBLEMS (unusual diarrhea, constipation, pain near the anus) TENDERNESS IN MOUTH AND THROAT WITH OR WITHOUT PRESENCE OF ULCERS (sore throat, sores in mouth, or a toothache) UNUSUAL RASH, SWELLING OR PAIN  UNUSUAL VAGINAL DISCHARGE OR ITCHING   Items with * indicate a potential emergency and should be followed up as soon as possible or go to the Emergency Department if any problems should occur.  Please show the CHEMOTHERAPY ALERT CARD or IMMUNOTHERAPY ALERT CARD at check-in to  the Emergency Department and triage nurse.  Should you have questions after your visit or need to cancel or reschedule your appointment, please contact [facility] MEDICAL ONCOLOGY  Dept: [phone]  and follow the prompts.  Office hours are 8:00 a.m. to 4:30 p.m. Monday - Friday. Please note that voicemails left after 4:00 p.m. may not be returned until the following business day.  We are closed weekends and major holidays. You have access to a nurse at all times for urgent questions. Please call the main number to the clinic Dept: [phone] and follow the prompts.   For any non-urgent questions, you may also contact your provider using MyChart. We now offer e-Visits for anyone 18 and older to request care online for non-urgent symptoms. For details visit [email or web address].   Also download the MyChart app! Go to the app store, search "MyChart", open the app, select , and log in with your MyChart username and password.  Due to Covid, a mask is required upon entering the hospital/clinic. If you do not have a mask, one will be given to you upon arrival. For doctor visits, patients may have 1 support person aged 18 or older with them. For treatment visits, patients cannot have anyone with them due to current Covid guidelines and our immunocompromised population.   

## 2021-02-13 NOTE — Progress Notes (Signed)
I, Jill [name], LAT, ATC, am serving as scribe for Dr. Lynne [name].   Jill [name] is a 45 y.o. female who presents to [name] at [name] today for f/u of LBP and coccydynia.  She was last seen by Jill [name] on 12/14/20 and was prescribed Gabapentin and was referred for pelvic PT, of which she's completed 3 visits. Of note, pt has a prior dx of malignant neoplasm of the R breast. Today, pt reports that she is feeling much better.  She reports slight discomfort during the day.  She has the most pain at night.  She feels like she has inflammation in her lower back.  She has no more PT visits scheduled.  She takes the Gabapentin intermittently but it bothers her stomach so would prefer not to take it.  Pain is mostly located now and bilateral ischial tuberosities P hamstring related doing well with home exercise program.  Dx imaging: 09/24/20 L-spine & sacrum MRI  Pertinent review of systems: No fevers or chills  Relevant historical information: Finishing up breast cancer treatment   Exam:  BP 100/70 (BP Location: Left Arm, Patient Position: Sitting, Cuff Size: Normal)   Pulse 79   Ht '4\' 10"'$  (1.473 m)   Wt 159 lb 3.2 oz (72.2 kg)   SpO2 98%   BMI 33.27 kg/m  General: Well Developed, well nourished, and in no acute distress.   MSK: L-spine nontender midline.  Normal lumbar motion. SI joints nontender bilaterally. Hips bilaterally minimally tender palpation left ischial tuberosity compared to right.  Normal hip motion. Some pain with resisted knee flexion and hip extension left compared to right. Negative H test.  Normal gait.    Lab and Radiology Results EXAM: MRI TOTAL SPINE WITHOUT AND WITH CONTRAST   TECHNIQUE: Multisequence MR imaging of the spine from the cervical spine to the sacrum was performed prior to and following IV contrast administration for evaluation of spinal metastatic disease.   Limited metastatic screening protocol  performed.   CONTRAST:  90m GADAVIST GADOBUTROL 1 MMOL/ML IV SOLN   COMPARISON:  MRI of the lumbar spine September 24, 2020.   FINDINGS: MRI CERVICAL SPINE FINDINGS   Alignment: Straightening of the normal cervical lordosis without sagittal subluxation.   Vertebrae: No abnormal marrow signal to suggest metastatic disease, acute fracture, or discitis/osteomyelitis. Postcontrast imaging is significantly limited by artifact, particularly superiorly.   Cord: No obvious cord signal abnormality on sagittal imaging.   Posterior Fossa, vertebral arteries, paraspinal tissues: Vertebral artery flow voids are not assessed on this study due to limited protocol. The inferior visualized posterior fossa is unremarkable on limited sagittal assessment.   Disc levels:   Limited evaluation due to limited protocol. Small disc bulge at C5-C6 partially effaces ventral CSF without significant canal stenosis. Limited evaluation of the foramina without axial imaging. No findings to suggest high-grade foraminal stenosis on sagittal imaging.   MRI THORACIC SPINE FINDINGS   Alignment:  Normal.   Vertebrae: No abnormal marrow signal to suggest metastatic disease, acute fracture, or discitis/osteomyelitis. No convincing abnormal enhancement. Artifact at the edge of imaging in the mid/lower thoracic spine on postcontrast imaging.   Cord: No obvious cord signal abnormality on sagittal imaging. No evidence of abnormal enhancement.   Paraspinal and other soft tissues: No paravertebral edema. Please see same day CT chest for intrathoracic evaluation.   Disc levels:   No significant canal stenosis. Mild disc height loss at T4-T5 and T5-T6. No high-grade bony  foraminal stenosis.   MRI LUMBAR SPINE FINDINGS   Segmentation:  Standard.   Alignment: Grade 1 anterolisthesis of L5 on S1. Otherwise, no substantial sagittal subluxation.   Vertebrae: No abnormal marrow signal to suggest metastatic  disease, acute fracture, or discitis/osteomyelitis. No evidence of osseous enhancement in the lumbar spine. Mild perifacet enhancement at L5-S1, likely degenerative given degenerative changes at this level (see below).   Conus medullaris: Extends to the superior L2 level and appears normal. No evidence of abnormal enhancement of the conus or cauda equina on sagittal imaging.   Paraspinal and other soft tissues: Mild edema in the posterior paraspinal subcutaneous fat, physiologic. Mild perifacet inflammatory changes at L5-S1. possible adnexal cyst, incompletely characterized on this limited study.   Disc levels:   Suspected pars defects L5 better characterized on prior MRI from September 24, 2020. Similar mild bilateral foraminal stenosis at L5-S1.   IMPRESSION: 1. No specific evidence of osseous metastatic disease in the cervical, thoracic, or lumbar spine on this limited screening protocol MRI. 2. 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     Electronically Signed   By: Jill Sheffield MD   On: 01/30/2021 09:22  EXAM: MRI SACRUM WITH AND WITHOUT CONTRAST   TECHNIQUE: Multiplanar multi-sequence MR imaging of the sacrum was performed before and after IV contrast administration.   CONTRAST:  CONTRAST 7 cc Gadavist   COMPARISON:  CT pelvis 09/06/2020   FINDINGS: Urinary Tract:  Unremarkable   Bowel:  Unremarkable   Vascular/Lymphatic: Small bilateral inguinal lymph nodes are not pathologically enlarged.   Reproductive: T-shaped IUD satisfactorily positioned along the endometrium. Right Bartholin cyst noted.   Other:  No supplemental non-categorized findings.   Musculoskeletal: Symmetric reduced T1 and T2 signal along both sacroiliac joints reflecting the sclerosis seen on CT, without significant abnormal marrow edema or well-defined erosions. No effusion of  the SI joints noted. Typical enhancement of the synovium observed.   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   There is no impinging lesion along the sacral plexus or visualized proximal sciatic nerves. No abnormal enhancement or edema signal in the sciatic nerves.   No findings of regional pelvic metastatic disease.   IMPRESSION: 1. Chronic bilateral sacroiliitis, with sclerosis along the SI joints but no current edema, erosion, or effusion. 2. No findings of metastatic disease to the visualized pelvis. 3. Incidental IUD along the endometrium.  Incidental Bartholin cyst. 4. 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     Electronically Signed   By: Van Clines M.D.   On: 09/26/2020 19:34   I, Jill [name], personally (independently) visualized and performed the interpretation of the images attached in this note. .     Assessment and Plan: 45 y.o. female with multifactorial low back and pelvis pain.  Originally pain was due to SI joint dysfunction and muscular back pain due to pars defects.  That pain has been pretty well managed with injection and physical therapy.  However now the pain is more located at the ischial tuberosity thought to be due to hamstring origin tendinopathy.  Overall she is doing pretty well.  Plan for continued home exercise program.  Recommend Askling L protocol and home exercise program taught by PT.  Recheck back as needed.   Discussed warning signs or symptoms. Please see discharge instructions. Patient expresses understanding.   The above documentation has been  reviewed and is accurate and complete Jill [name], M.D.  Spanish interpreter used.

## 2021-02-14 ENCOUNTER — Ambulatory Visit (INDEPENDENT_AMBULATORY_CARE_PROVIDER_SITE_OTHER): Payer: Self-pay | Admitting: Family Medicine

## 2021-02-14 ENCOUNTER — Encounter: Payer: Self-pay | Admitting: Family Medicine

## 2021-02-14 ENCOUNTER — Other Ambulatory Visit: Payer: Self-pay

## 2021-02-14 VITALS — BP 100/70 | HR 79 | Ht <= 58 in | Wt 159.2 lb

## 2021-02-14 DIAGNOSIS — M76899 Other specified enthesopathies of unspecified lower limb, excluding foot: Secondary | ICD-10-CM

## 2021-02-14 NOTE — Patient Instructions (Signed)
Thank you for coming in today.   Add hamstring exercises.   The Askling L Protocol for Hamstring Strains  Lyman Bishop PT  Recheck as needed.   Rehabilitacin para la distensin de los isquiotibiales Hamstring Strain Rehab Pregunte al mdico qu ejercicios son seguros para usted. Haga los ejercicios exactamente como se lo haya indicado el mdico y gradelos como se lo hayan indicado. Es normal sentir un estiramiento leve, tironeo, opresin o Tree surgeon al [employer] [place]. Detngase de inmediato si siente un dolor repentino o Printmaker. No comience a hacer estos ejercicios hasta que se lo indique el mdico. Ejercicios de elongacin y amplitud de movimiento Estos ejercicios precalientan los msculos y las articulaciones, y [place] y la flexibilidad de los muslos. Estos ejercicios tambin ayudan a Best boy, el adormecimiento y el hormigueo. Hable con el mdico sobreestas restricciones. Extensin de rodilla, sentado  Sintese con el taln izquierdo/derecho apoyado en una silla, mesita o taburete para los pies. No tenga nada debajo de la rodilla para sostenerla. Relaje los msculos de la pierna, dejando que la gravedad extienda la rodilla (extensin). Debe sentir un estiramiento detrs de la rodilla izquierda/derecha. Si el mdico lo autoriza, puede Animal nutritionist de __________ [employer], justo encima de la rtula, para lograr un estiramiento ms profundo. Mantenga esta posicin durante __________ segundos. Repita __________ veces. Realice este ejercicio __________ veces al da. Estiramiento sentado A veces, este ejercicio se denomina estiramiento de isquiotibiales y aductores. Sintese en el piso con las piernas abiertas y estiradas. Mantenga las rodillas estiradas durante este ejercicio. Con la cabeza y la espalda en lnea recta, doble la cintura para llegar hasta el pie izquierdo (posicin A). Debe sentir un estiramiento en la parte interna del muslo  derecho (aductores). Mantenga esta posicin durante __________ segundos. Vuelva lentamente a la posicin recta. Con la cabeza y la espalda en lnea recta, doble la cintura para estirarse hacia adelante (posicin B). Debe sentir un estiramiento en la parte posterior de los muslos o las rodillas (isquiotibiales). Mantenga esta posicin durante __________ segundos. Vuelva lentamente a la posicin recta. Con la cabeza y la espalda en lnea recta, doble la cintura para llegar hasta el pie derecho (posicin C). Debe sentir un estiramiento en la parte interna del muslo izquierdo (aductores). Mantenga esta posicin durante __________ segundos. Vuelva lentamente a la posicin recta. Repita __________ veces. Realice este ejercicio __________ veces al da. Estiramiento de isquiotibiales, en decbito supino  Acustese boca arriba (posicin supina). Ate un cinturn o una toalla en la regin metatarsiana del pie izquierdo/derecho. La regin metatarsiana del pie es la superficie sobre la que caminamos, justo debajo de los dedos. Estire la rodilla izquierda/derecha y, lentamente, tire del cinturn o la toalla para Doctor, general practice pierna. No flexione la rodilla izquierda/derecha mientras realiza este ejercicio. Mantenga la otra pierna estirada contra el suelo. [place] la pierna izquierda/derecha hasta sentir un leve estiramiento detrs de la rodilla o el muslo izquierdo/derecho (isquiotibiales). Mantenga esta posicin durante __________ segundos. Regrese lentamente la pierna a la posicin inicial. Repita __________ veces. Realice este ejercicio __________ veces al da. Ejercicios de fortalecimiento Estos ejercicios fortalecen los muslos y les otorgan resistencia. La resistencia es la capacidad de usar los msculos durante un tiempo prolongado,incluso despus de que se cansen. Elevaciones con la pierna extendida, en decbito prono Este ejercicio fortalece los msculos que [employer] caderas (extensores de la  cadera). Recustese boca abajo sobre una superficie firme (posicin prona). Tensione los [employer]  nalgas para levantar la pierna izquierda/derecha unas 4 pulgadas (10 cm). Mantenga la rodilla estirada mientras eleva la pierna. Si no llega a esta altura sin arquear la espalda, coloque una almohada debajo de la cadera. Mantenga esta posicin durante __________ segundos. Baje lentamente la pierna hasta la posicin inicial. Relaje los msculos por completo antes de comenzar la siguiente repeticin. Repita __________ veces. Realice este ejercicio __________ veces al da. Puente Este ejercicio fortalece los msculos de las nalgas y la parte trasera de los muslos (extensores de la cadera). Recustese boca arriba sobre una superficie firme con las rodillas flexionadas y los pies completamente apoyados en el suelo. Tensione los [employer] de las nalgas y Chemical engineer cadera del suelo hasta que el tronco quede a la altura de los muslos. Debe sentir el trabajo muscular en las nalgas y la parte de atrs de los muslos. No arquee la espalda. Mantenga esta posicin durante __________ segundos. Baje lentamente la cadera a la posicin inicial. Relaje los msculos de las nalgas por completo entre repeticiones. Si se lo indica el mdico, mantenga la pelvis levantada del suelo y aleje los pies lo ms que pueda, sin perder el control. Mantenga esta posicin durante __________ segundos y luego vuelva a acercar los pies. Repita __________ veces. Realice este ejercicio __________ veces al da. Caminata lateral con banda Este es un ejercicio en el que se camina hacia los lados (lateral), con tensin causada por una banda para ejercicios. El ejercicio fortalece los msculos de la cadera (abductores de la cadera). Prese en un pasillo largo. tese una banda para ejercicios alrededor [employer], justo por encima de las rodillas. Flexione con [place] y lleve la cadera hacia abajo y [place], para que  el peso est [employer]itor talones. Desplcese hacia el costado por todo el largo del pasillo, Kamaili los dedos del pie hacia adelante y [place] la banda tensionada. Repita el ejercicio comenzando con la otra pierna. Repita __________ veces. Realice este ejercicio __________ veces al da. Pararse sobre una pierna con estiramiento Este ejercicio tambin se denomina estiramiento excntrico de los isquiotibiales. Prese sobre el pie izquierdo/derecho. Sin despegar el dedo gordo del suelo, intente mantener el arco levantado. Lentamente, agchese lo ms que pueda sin perder el equilibrio. Bajar el muslo bajo tensin se denomina estiramiento excntrico. Mantenga esta posicin durante __________ segundos. Repita __________ veces. Realice este ejercicio __________ veces al da. Plancha, en decbito prono Este ejercicio fortalece los msculos del abdomen y la zona del torso. Acustese boca abajo en el suelo (posicin prona)y [employer] codos para levantarse. Las manos deben estar colocadas frente a usted y los codos debajo de los hombros. [employer] pies similares a una posicin hacia arriba para que los dedos del pie estn apoyados en el suelo. Contraiga los msculos abdominales y eleve el cuerpo del suelo. No arquee la espalda. No contenga la respiracin. Mantenga esta posicin durante __________ segundos. Repita __________ veces. Realice este ejercicio __________ veces al da. Esta informacin no tiene Marine scientist el consejo del mdico. Asegresede hacerle al mdico cualquier pregunta que tenga. Document Revised: 07/10/2018 Document Reviewed: 07/10/2018 Elsevier Patient Education  [place].

## 2021-03-01 ENCOUNTER — Other Ambulatory Visit: Payer: Self-pay | Admitting: *Deleted

## 2021-03-01 ENCOUNTER — Inpatient Hospital Stay: Payer: No Typology Code available for payment source | Attending: Oncology

## 2021-03-01 ENCOUNTER — Other Ambulatory Visit: Payer: Self-pay

## 2021-03-01 ENCOUNTER — Inpatient Hospital Stay: Payer: No Typology Code available for payment source

## 2021-03-01 VITALS — BP 112/73 | HR 87 | Temp 98.8°F | Wt 163.2 lb

## 2021-03-01 DIAGNOSIS — Z95828 Presence of other vascular implants and grafts: Secondary | ICD-10-CM

## 2021-03-01 DIAGNOSIS — C50411 Malignant neoplasm of upper-outer quadrant of right female breast: Secondary | ICD-10-CM | POA: Insufficient documentation

## 2021-03-01 DIAGNOSIS — R300 Dysuria: Secondary | ICD-10-CM

## 2021-03-01 DIAGNOSIS — Z170 Estrogen receptor positive status [ER+]: Secondary | ICD-10-CM | POA: Insufficient documentation

## 2021-03-01 DIAGNOSIS — C773 Secondary and unspecified malignant neoplasm of axilla and upper limb lymph nodes: Secondary | ICD-10-CM | POA: Insufficient documentation

## 2021-03-01 DIAGNOSIS — Z5112 Encounter for antineoplastic immunotherapy: Secondary | ICD-10-CM | POA: Insufficient documentation

## 2021-03-01 DIAGNOSIS — Z79899 Other long term (current) drug therapy: Secondary | ICD-10-CM | POA: Insufficient documentation

## 2021-03-01 LAB — CBC WITH DIFFERENTIAL (CANCER CENTER ONLY)
Abs Immature Granulocytes: 0.01 10*3/uL (ref 0.00–0.07)
Basophils Absolute: 0.1 10*3/uL (ref 0.0–0.1)
Basophils Relative: 1 %
Eosinophils Absolute: 0.1 10*3/uL (ref 0.0–0.5)
Eosinophils Relative: 3 %
HCT: 39.1 % (ref 36.0–46.0)
Hemoglobin: 13 g/dL (ref 12.0–15.0)
Immature Granulocytes: 0 %
Lymphocytes Relative: 30 %
Lymphs Abs: 1.1 10*3/uL (ref 0.7–4.0)
MCH: 30.2 pg (ref 26.0–34.0)
MCHC: 33.2 g/dL (ref 30.0–36.0)
MCV: 90.7 fL (ref 80.0–100.0)
Monocytes Absolute: 0.3 10*3/uL (ref 0.1–1.0)
Monocytes Relative: 8 %
Neutro Abs: 2 10*3/uL (ref 1.7–7.7)
Neutrophils Relative %: 58 %
Platelet Count: 291 10*3/uL (ref 150–400)
RBC: 4.31 MIL/uL (ref 3.87–5.11)
RDW: 12.1 % (ref 11.5–15.5)
WBC Count: 3.5 10*3/uL — ABNORMAL LOW (ref 4.0–10.5)
nRBC: 0 % (ref 0.0–0.2)

## 2021-03-01 LAB — URINALYSIS, COMPLETE (UACMP) WITH MICROSCOPIC
Bacteria, UA: NONE SEEN
Bilirubin Urine: NEGATIVE
Glucose, UA: NEGATIVE mg/dL
Ketones, ur: NEGATIVE mg/dL
Leukocytes,Ua: NEGATIVE
Nitrite: NEGATIVE
Protein, ur: NEGATIVE mg/dL
Specific Gravity, Urine: 1.005 (ref 1.005–1.030)
pH: 7 (ref 5.0–8.0)

## 2021-03-01 LAB — CMP (CANCER CENTER ONLY)
ALT: 36 U/L (ref 0–44)
AST: 40 U/L (ref 15–41)
Albumin: 3.7 g/dL (ref 3.5–5.0)
Alkaline Phosphatase: 88 U/L (ref 38–126)
Anion gap: 9 (ref 5–15)
BUN: 11 mg/dL (ref 6–20)
CO2: 24 mmol/L (ref 22–32)
Calcium: 9.1 mg/dL (ref 8.9–10.3)
Chloride: 107 mmol/L (ref 98–111)
Creatinine: 0.7 mg/dL (ref 0.44–1.00)
GFR, Estimated: >60 mL/min (ref >60)
Glucose, Bld: 89 mg/dL (ref 70–99)
Potassium: 4.5 mmol/L (ref 3.5–5.1)
Sodium: 140 mmol/L (ref 135–145)
Total Bilirubin: 0.4 mg/dL (ref 0.3–1.2)
Total Protein: 6.5 g/dL (ref 6.5–8.1)

## 2021-03-01 MED ORDER — DIPHENHYDRAMINE HCL 25 MG PO CAPS
25.0000 mg | ORAL_CAPSULE | Freq: Once | ORAL | Status: AC
  Administered 2021-03-01: 25 mg via ORAL
  Filled 2021-03-01: qty 1

## 2021-03-01 MED ORDER — SODIUM CHLORIDE 0.9% FLUSH
10.0000 mL | Freq: Once | INTRAVENOUS | Status: AC
  Administered 2021-03-01: 10 mL

## 2021-03-01 MED ORDER — TRASTUZUMAB-ANNS CHEMO 150 MG IV SOLR
6.0000 mg/kg | Freq: Once | INTRAVENOUS | Status: AC
  Administered 2021-03-01: 420 mg via INTRAVENOUS
  Filled 2021-03-01: qty 20

## 2021-03-01 MED ORDER — HEPARIN SOD (PORK) LOCK FLUSH 100 UNIT/ML IV SOLN
500.0000 [IU] | Freq: Once | INTRAVENOUS | Status: AC | PRN
  Administered 2021-03-01: 500 [IU]

## 2021-03-01 MED ORDER — SODIUM CHLORIDE 0.9% FLUSH
10.0000 mL | INTRAVENOUS | Status: DC | PRN
  Administered 2021-03-01: 10 mL

## 2021-03-01 MED ORDER — SODIUM CHLORIDE 0.9 % IV SOLN: Freq: Once | INTRAVENOUS | Status: AC

## 2021-03-01 MED ORDER — ACETAMINOPHEN 325 MG PO TABS
650.0000 mg | ORAL_TABLET | Freq: Once | ORAL | Status: AC
  Administered 2021-03-01: 650 mg via ORAL
  Filled 2021-03-01: qty 2

## 2021-03-01 NOTE — Patient Instructions (Signed)
[facility]  Discharge Instructions: Thank you for choosing [facility] to provide your oncology and hematology care.   If you have a lab appointment with the [facility], please go directly to the [facility] and check in at the registration area.   Wear comfortable clothing and clothing appropriate for easy access to any Portacath or PICC line.   We strive to give you quality time with your provider. You may need to reschedule your appointment if you arrive late (15 or more minutes).  Arriving late affects you and other patients whose appointments are after yours.  Also, if you miss three or more appointments without notifying the office, you may be dismissed from the clinic at the provider's discretion.      For prescription refill requests, have your pharmacy contact our office and allow 72 hours for refills to be completed.    Today you received the following chemotherapy and/or immunotherapy agents Kanjinti      To help prevent nausea and vomiting after your treatment, we encourage you to take your nausea medication as directed.  BELOW ARE SYMPTOMS THAT SHOULD BE REPORTED IMMEDIATELY: *FEVER GREATER THAN 100.4 F (38 C) OR HIGHER *CHILLS OR SWEATING *NAUSEA AND VOMITING THAT IS NOT CONTROLLED WITH YOUR NAUSEA MEDICATION *UNUSUAL SHORTNESS OF BREATH *UNUSUAL BRUISING OR BLEEDING *URINARY PROBLEMS (pain or burning when urinating, or frequent urination) *BOWEL PROBLEMS (unusual diarrhea, constipation, pain near the anus) TENDERNESS IN MOUTH AND THROAT WITH OR WITHOUT PRESENCE OF ULCERS (sore throat, sores in mouth, or a toothache) UNUSUAL RASH, SWELLING OR PAIN  UNUSUAL VAGINAL DISCHARGE OR ITCHING   Items with * indicate a potential emergency and should be followed up as soon as possible or go to the Emergency Department if any problems should occur.  Please show the CHEMOTHERAPY ALERT CARD or IMMUNOTHERAPY ALERT CARD at check-in to  the Emergency Department and triage nurse.  Should you have questions after your visit or need to cancel or reschedule your appointment, please contact [facility]  Dept: [phone]  and follow the prompts.  Office hours are 8:00 a.m. to 4:30 p.m. Monday - Friday. Please note that voicemails left after 4:00 p.m. may not be returned until the following business day.  We are closed weekends and major holidays. You have access to a nurse at all times for urgent questions. Please call the main number to the clinic Dept: [phone] and follow the prompts.   For any non-urgent questions, you may also contact your provider using MyChart. We now offer e-Visits for anyone 36 and older to request care online for non-urgent symptoms. For details visit [email or web address].   Also download the MyChart app! Go to the app store, search "MyChart", open the app, select [facility], and log in with your MyChart username and password.  Due to Covid, a mask is required upon entering the hospital/clinic. If you do not have a mask, one will be given to you upon arrival. For doctor visits, patients may have 1 support person aged 4 or older with them. For treatment visits, patients cannot have anyone with them due to current Covid guidelines and our immunocompromised population.   Trastuzumab injection for infusion Qu es este medicamento? El TRASTUZUMAB es un anticuerpo monoclonal. Genene Churn para tratar el cncer de mama y de [place]. Este medicamento puede ser utilizado para otros usos; si tiene alguna pregunta consulte con su proveedor de atencin mdica o con su farmacutico. MARCAS  COMUNES: Herceptin, Belenda Cruise, Ogivri, Ontruzant, Trazimera Qu le debo informar a mi profesional de la salud antes de tomar este medicamento? Necesitan saber si usted presenta alguno de los siguientes problemas o situaciones: enfermedad cardiaca insuficiencia cardiaca enfermedad pulmonar  o respiratoria, como asma una reaccin alrgica o inusual al trastuzumab, al alcohol benclico, a otros medicamentos, alimentos, colorantes o conservantes si est embarazada o buscando quedar embarazada si est amamantando a un beb Cmo debo utilizar este medicamento? Este medicamento se administra mediante infusin por va intravenosa. Lo administra un profesional de la salud calificado en un hospital o en un entorno clnico. Hable con su pediatra para informarse acerca del uso de este medicamento en nios. Este medicamento no est aprobado para uso en nios. Sobredosis: Pngase en contacto inmediatamente con un centro toxicolgico o una sala de urgencia si usted cree que haya tomado demasiado medicamento. ATENCIN: [employer] es solo para usted. No comparta este medicamento con nadie. Qu sucede si me olvido de una dosis? Es importante no olvidar ninguna dosis. Informe a su mdico o a su profesional de la salud si no puede asistir a Photographer. Qu puede interactuar con este medicamento? Este medicamento podra interactuar con los siguientes frmacos: ciertos tipos de quimioterapia, tales como daunorubicina, [place], [place], e idarubicina Puede ser que esta lista no menciona todas las posibles interacciones. Informe a su profesional de KB [employer]	[place] de [employer] productos a base de hierbas, medicamentos de [place] o suplementos nutritivos que est tomando. Si usted fuma, consume bebidas alcohlicas o si utiliza drogas ilegales, indqueselo tambin a su profesional de KB Home	[place]. Algunas sustancias pueden interactuar con su medicamento. A qu debo estar atento al usar Coca[employer]Cola? Visite a su mdico para chequear su evolucin peridicamente. Si presenta algn efecto secundario, infrmelo. Sin embargo, contine con el tratamiento aun si se siente enfermo, a menos que su mdico le indique que lo suspenda. Consulte a su mdico o a su profesional de la salud si tiene fiebre,  escalofros, dolor de garganta, o cualquier otro sntoma de resfro o gripe. No se trate usted mismo. Trate de no acercarse a personas que estn enfermas. Es posible que tenga [place], escalofros y temblores durante su primera infusin. Estos efectos generalmente son leves y pueden tratarse con otros medicamentos. Informe cualquier efecto secundario durante la infusin a su profesional de KB Home	[place]. La fiebre y los escalofros por lo general no suceden con las infusiones posteriores. No debe quedar embarazada mientras est tomando este medicamento o por 7 meses despus de dejar de usarlo. Las mujeres deben informar a su mdico si estn buscando quedar embarazadas o si creen que estn embarazadas. Las mujeres con la posibilidad de Best boy nios deben tener una prueba de embarazo negativa antes de Art gallery manager a tomar este medicamento. Existe la posibilidad de que ocurran efectos secundarios graves a un beb sin nacer. Para ms informacin hable con su profesional de la salud o su farmacutico. No debe amamantar a un beb mientras est tomando este medicamento o por 7 meses despus de dejar de usarlo. Las [employer] deben usar un mtodo anticonceptivo eficaz con este medicamento. Qu efectos secundarios puedo tener al [employer] este medicamento? Efectos secundarios que debe informar a su mdico o a Barrister's clerk de la salud tan pronto como sea posible: Chief of Staff, como erupcin cutnea, comezn/picazn o urticaria, e hinchazn de la cara, los labios o Advertising account planner en el pecho o palpitaciones tos mareos sensacin de Youth worker o aturdimiento, cadas fiebre sensacin general de Personnel officer  enfermo o sntomas gripales signos de empeoramiento de la insuficiencia cardiaca, tales como problemas respiratorios; hinchazn en las piernas y los pies cansancio o debilidad inusual Efectos secundarios que generalmente no requieren atencin mdica (infrmelos a su mdico o a Barrister's clerk de la salud si persisten o si son  molestos): dolor de Psychiatric nurse sentido del gusto diarrea dolor en las articulaciones nuseas, vmito prdida de peso Puede ser que esta lista no menciona todos los posibles efectos secundarios. Comunquese a su mdico por asesoramiento mdico [employer]. Usted puede informar los efectos secundarios a la FDA por telfono al 1-800-FDA-1088. Dnde debo guardar mi medicina? Este medicamento se administra en hospitales o clnicas y no necesitar guardarlo en su domicilio. ATENCIN: Este folleto es un resumen. Puede ser que no cubra toda la posible informacin. Si usted tiene preguntas acerca de esta medicina, consulte con su mdico, su farmacutico o su profesional de Technical sales engineer.  2022 Elsevier/Gold Standard (2019-12-15 00:00:00)

## 2021-03-02 ENCOUNTER — Telehealth: Payer: Self-pay | Admitting: *Deleted

## 2021-03-02 LAB — URINE CULTURE: Culture: NO GROWTH

## 2021-03-02 NOTE — Telephone Encounter (Signed)
This RN called pt via [employer] to inform pt the U/A did not show an UTI.  Inquired further with pt stating she is having pain in her low abd " more noticeable with sitting "  This RN inquired about discharge from the vagina - with pt stating she does with odor " I do not like how it smells"  She states she has as " device " that was given to her from [facility]. ( Noted address is the [facility] at [facility].  She has not been back there since obtaining the " device " - with further inquiry she was able to verify this as an IUD.  This RN informed her RN would call to see if they could see her for further evaluation for possible other causes for current symptoms.  This RN contacted the [facility] and spoke with Hinton Dyer RN per above- noted pt was seen there but has not had an appt since 2020.  Appointment arranged for 03/06/2021 at 12:45.  Pt's demographic's verified and Hinton Dyer was able to text appt information to the patient's phone.  This RN will follow up with pt tomorrow for verification of appt.

## 2021-03-03 ENCOUNTER — Telehealth: Payer: Self-pay | Admitting: *Deleted

## 2021-03-03 NOTE — Telephone Encounter (Signed)
This RN called pt this AM to verify she is aware of appt for gyn on 9/12 at 1245 at the [facility] office at [place]  She verified above.  Also informed her they are requesting her to bring proof of income and picture ID- and if more then 15 late she would need to be rescheduled.  She verbalized understanding.  Note above communication was performed per [employer].

## 2021-03-20 ENCOUNTER — Encounter: Payer: Self-pay | Admitting: *Deleted

## 2021-03-20 DIAGNOSIS — Z170 Estrogen receptor positive status [ER+]: Secondary | ICD-10-CM

## 2021-03-20 NOTE — Progress Notes (Signed)
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  She is not accompanied by an interpreter  She continues on trastuzumab.  She has absolutely no side effects from these treatments.  Most recent echo continues to show an excellent ejection fraction.  She is already scheduled for an echo early October.  Today is her final dose of trastuzumab.    She is taking tamoxifen daily and tolerates it well.  She denies any significant side effects due to this.     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  Her daughter (who has a mild case of cerebral palsy) helps out.  [name] tells me her Mirena IUD was removed.  Currently she is not using steady form of contraception.  Generally she feels well.  She would like her port removed.  Detailed review of systems today was otherwise stable.   COVID 19 VACCINATION STATUS: fully vaccinated [employer]), no booster as of September 2022  HISTORY OF CURRENT ILLNESS: From the original intake note:  "[name]" herself palpated a right breast abnormality sometime in 2019.,  She did not pay much mind.  This was noted again during her pregnancy and she underwent bilateral diagnostic mammography with tomography and  right breast ultrasonography at [facility] on 01/21/2020 showing: breast density category C; 4.1 cm irregular mass with associated group of pleomorphic calcifications measuring approximately 4.9 cm, located at site of palpable concern in upper-outer right breast at 9:30; no right axillary lymphadenopathy.  Accordingly on 02/04/2020 she proceeded to biopsy of the right breast area in question. The pathology from this procedure ([id number]) showed: invasive and in situ mammary carcinoma, e-cadherin positive, grade 2. Prognostic indicators significant for: estrogen receptor, 100% positive and progesterone receptor, 100% positive, both with strong staining intensity. Proliferation marker Ki67 at 20%. HER2 equivocal by immunohistochemistry (2+), but positive by fluorescent in situ hybridization with a signals ratio 5.39 and number per cell 11.05.  The patient's subsequent history is as detailed below.   PAST MEDICAL HISTORY: Past Medical History:  Diagnosis Date   Breast cancer ([place]) 03/07/2020   Infected cyst of Bartholin's gland duct    Medical history non-contributory     PAST SURGICAL HISTORY: Past Surgical History:  Procedure Laterality Date   IR IMAGING GUIDED PORT INSERTION  03/02/2020   MASTECTOMY W/ SENTINEL NODE BIOPSY Right 03/07/2020   Procedure: RIGHT MASTECTOMY WITH SENTINEL LYMPH NODE BIOPSY;  Surgeon: Jovita Kussmaul, MD;  Location: [facility];  Service: General;  Laterality: Right;   NO PAST SURGERIES     WISDOM TOOTH EXTRACTION      FAMILY HISTORY: Family History  Problem Relation Age of Onset   Diabetes Maternal Grandmother    Hypertension Mother   The patient's father is 2 and her mother 55 as of August 2021.  The patient is 3 brothers and 2 sisters.  There is no history of cancer in the family to her knowledge   GYNECOLOGIC HISTORY:  No LMP recorded. (Menstrual status: IUD). Menarche: 45 years old Age at first live birth: 45 years old [place] P  4 LMP irregular Contraceptive: Mirena IUD in place HRT n/a  Hysterectomy? no BSO? no   SOCIAL HISTORY: (updated 01/2020)  [name] works for a Arboriculturist . Her significant other [name] works Occupational hygienist.  They are both from [place].  The patient's children are ages 40, 60, 13, and 61 months as of August 2021.  The older 3 children are from a different father and their last name is [name].  (The patient was not married to Mr. [name] so there is no legal issue regarding access to medical records, etc.).  The oldest daughter has a mild case of cerebral palsy.  The 27-monthold is a child of Tiyonna and MEustace  The patient attends a local EBerrienDIRECTIVES: Not in place   HEALTH MAINTENANCE: Social History   Tobacco Use   Smoking status: Never   Smokeless tobacco: Never  Vaping Use   Vaping Use: Never used  Substance Use Topics   Alcohol use: Never   Drug use: Never     Colonoscopy: n/a (age)  PAP: 10/2018, negative  Bone density: n/a (age)   No Known Allergies  Current Outpatient Medications  Medication Sig Dispense Refill   gabapentin (NEURONTIN) 300 MG capsule Take 1 capsule (300 mg total) by mouth at bedtime as needed (burning pain). Instructions in spanish (Patient not taking: Reported on 02/14/2021) 90 capsule 1   ibuprofen (ADVIL) 200 MG tablet Take 400 mg by mouth every 6 (six) hours as needed for headache or moderate pain.     No current facility-administered medications for this visit.    OBJECTIVE: Spanish speaker who appears well  There were no vitals filed for this visit.     There is no height or weight on file to calculate BMI.   Wt Readings from Last 3 Encounters:  03/01/21 163 lb 4 oz (74 kg)  02/14/21 159 lb 3.2 oz (72.2 kg)  02/08/21 160 lb 4.8 oz (72.7 kg)     ECOG FS:1 - Symptomatic but completely ambulatory  Sclerae unicteric, EOMs intact Wearing a mask No cervical or supraclavicular adenopathy Lungs no rales  or rhonchi Heart regular rate and rhythm Abd soft, nontender, positive bowel sounds MSK no focal spinal tenderness, no upper extremity lymphedema Neuro: nonfocal, well oriented, appropriate affect Breasts: The right breast is status post mastectomy and radiation.  There is some hyperpigmentation.  She has some discomfort associated with the axilla but there is no evidence of local recurrence.  The left breast and left axilla are benign.   LAB RESULTS:  CMP     Component Value Date/Time   NA 140 03/01/2021 0913   NA 138 12/15/2019 1543   K 4.5 03/01/2021 0913   CL 107 03/01/2021 0913   CO2 24 03/01/2021 0913   GLUCOSE 89 03/01/2021 0913   BUN 11 03/01/2021 0913   BUN 10 12/15/2019 1543   CREATININE 0.70 03/01/2021 0913   CALCIUM 9.1 03/01/2021 0913   PROT 6.5 03/01/2021 0913   PROT 7.3 12/15/2019 1543   ALBUMIN 3.7 03/01/2021 0913   ALBUMIN 4.5 12/15/2019 1543   AST 40 03/01/2021 0913   ALT 36 03/01/2021 0913   ALKPHOS 88 03/01/2021 0913   BILITOT 0.4  03/01/2021 0913   GFRNONAA >60 03/01/2021 0913   GFRAA >60 03/28/2020 0813   GFRAA >60 02/22/2020 1353    No results found for: TOTALPROTELP, ALBUMINELP, A1GS, A2GS, BETS, BETA2SER, GAMS, MSPIKE, SPEI  Lab Results  Component Value Date   WBC 3.5 (L) 03/01/2021   NEUTROABS 2.0 03/01/2021   HGB 13.0 03/01/2021   HCT 39.1 03/01/2021   MCV 90.7 03/01/2021   PLT 291 03/01/2021    No results found for: LABCA2  No components found for: CBJSEG315  No results for input(s): INR in the last 168 hours.  No results found for: LABCA2  No results found for: VVO160  No results found for: VPX106  No results found for: YIR485  No results found for: CA2729  No components found for: HGQUANT  No results found for: CEA1 / No results found for: CEA1   No results found for: AFPTUMOR  No results found for: CHROMOGRNA  No results found for: KPAFRELGTCHN, LAMBDASER, KAPLAMBRATIO (kappa/lambda light chains)  Lab Results   Component Value Date   HGBA 97.0 10/10/2018   (Hemoglobinopathy evaluation)   No results found for: LDH  No results found for: IRON, TIBC, IRONPCTSAT (Iron and TIBC)  No results found for: FERRITIN  Urinalysis    Component Value Date/Time   COLORURINE YELLOW 03/01/2021 1020   APPEARANCEUR CLEAR 03/01/2021 1020   LABSPEC 1.005 03/01/2021 1020   PHURINE 7.0 03/01/2021 1020   GLUCOSEU NEGATIVE 03/01/2021 1020   HGBUR SMALL (A) 03/01/2021 1020   BILIRUBINUR NEGATIVE 03/01/2021 1020   KETONESUR NEGATIVE 03/01/2021 1020   PROTEINUR NEGATIVE 03/01/2021 1020   NITRITE NEGATIVE 03/01/2021 1020   LEUKOCYTESUR NEGATIVE 03/01/2021 1020    STUDIES: No results found.   ELIGIBLE FOR AVAILABLE RESEARCH PROTOCOL: AET  ASSESSMENT: 45 y.o. [place] woman status post right breast upper outer quadrant biopsy 02/04/2020 for a clinical T2 N0, stage IB invasive ductal carcinoma, grade 2, triple positive, with an MIB-1 of 20%  (1) status post right mastectomy and sentinel lymph node sampling 03/07/2020 for a pT2 pN1, stage IB invasive ductal carcinoma, grade 2, with negative margins.  (a) a total of 4 lymph nodes were removed, one positive  (2) adjuvant chemo immunotherapy will consist of carboplatin, docetaxel, trastuzumab and pertuzumab every 21 days x 6 starting 03/29/2020, completed 07/18/2020  (a) pertuzumab discontinued after cycle 2 secondary to diarrhea  (3) trastuzumab continuing to complete a year (last dose 03/21/2021)  (a) echo 03/18/2020 shows an ejection fraction in the 60-65% range  (b) echo 06/29/2020 showed an EF in the 65-70% range  (c) echo 09/28/2020 showed an EF of 55-60%  (d) echo 01/02/2021 shows an ejection fraction in the 60-65% range  (e) echo 03/27/2021  (4) adjuvant radiation 08/17/2020 through 10/03/2020 Site Technique Total Dose (Gy) Dose per Fx (Gy) Completed Fx Beam Energies  Chest Wall, Right: CW_Rt 3D 50.4/50.4 1.8 28/28 6X, 10X  Chest Wall, Right:  CW_Rt_SCLV 3D 50.4/50.4 1.8 28/28 6X, 10X  Chest Wall, Right: CW_Rt_Bst Electron 10/10 2 5/5 6E   (5) genetics testing 03/02/2020 through the [facility] Common Hereditary Cancers Panel. . The Common Hereditary Cancers Panel found no deleterious mutations in APC, ATM, AXIN2, BARD1, BMPR1A, BRCA1, BRCA2, BRIP1, CDH1, CDK4, CDKN2A (p14ARF), CDKN2A (p16INK4a), CHEK2, CTNNA1, DICER1, EPCAM (Deletion/duplication testing only), GREM1 (promoter region deletion/duplication testing only), KIT, MEN1, MLH1, MSH2, MSH3, MSH6, MUTYH, NBN, NF1, NHTL1, PALB2, PDGFRA, PMS2, POLD1, POLE, PTEN, RAD50, RAD51C, RAD51D, RNF43, SDHB, SDHC, SDHD, SMAD4, SMARCA4. STK11, TP53, TSC1, TSC2, and VHL.  The following genes were evaluated for sequence changes only: SDHA and HOXB13 c.251G>A variant only.   (a)  a Variant of uncertain significance (VUS) in DICER1 at c.3380T>G (p.Ile1127Ser) was noted  (6) tamoxifen started 02/12/2020 in anticipation of possible surgical delays, held during chemotherapy, resumed February 2022  (7) restaging studies:  (A) head CT with and without contrast 01/30/2021 showed no metastatic disease  (B) chest CT 01/30/2021 shows no evidence of metastatic disease.  There is hepatic steatosis, radiation changes right lung, scattered right upper lobe nodules all less than 0.5 cm  (C) MRI of the total spine 01/27/2021 shows no evidence of metastatic disease.  There is L5 pars defect and mild degenerative disc disease   PLAN: Zeda completes her year of trastuzumab today.  She may have her port removed at any time and I have alerted her surgeon regarding that.  She was taking tamoxifen intermittently because she was not sure how important that is.  We discussed that in detail today and she understands this is just about the most effective medication aside from Herceptin that she will be taking.  It will cut her risk of recurrence in almost half.  I placed the prescription in for her through the [facility]  For contraception now that her Verdis Frederickson has been removed I recommend a copper IUD.  She will discuss that with her gynecologist  Otherwise she finishes Herceptin today, will have her port removed, and will return to see me in December.  At that time we will set her up for long-term follow-up.  Total encounter time 25 minutes.Sarajane Jews C. Moniqua Engebretsen, MD 03/21/21 10:46 AM Medical Oncology and Hematology [facility] [place], [place] [address] Tel. [phone]    Fax. [phone]   I, Wilburn Mylar, am acting as scribe for Dr. Virgie Dad. Syrina Wake.  I, Lurline Del MD, have reviewed the above documentation for accuracy and completeness, and I agree with the above.   *Total Encounter Time as defined by the Centers for Medicare and Medicaid Services includes, in addition to the face-to-face time of a patient visit (documented in the note above) non-face-to-face time: obtaining and reviewing outside history, ordering and reviewing medications, tests or procedures, care coordination (communications with other health care professionals or caregivers) and documentation in the medical record.

## 2021-03-21 ENCOUNTER — Other Ambulatory Visit: Payer: Self-pay

## 2021-03-21 ENCOUNTER — Inpatient Hospital Stay (HOSPITAL_BASED_OUTPATIENT_CLINIC_OR_DEPARTMENT_OTHER): Payer: No Typology Code available for payment source | Admitting: Oncology

## 2021-03-21 ENCOUNTER — Other Ambulatory Visit (HOSPITAL_COMMUNITY): Payer: Self-pay

## 2021-03-21 ENCOUNTER — Inpatient Hospital Stay: Payer: No Typology Code available for payment source

## 2021-03-21 VITALS — BP 98/52 | HR 70 | Temp 97.5°F | Resp 17 | Wt 162.1 lb

## 2021-03-21 DIAGNOSIS — C50411 Malignant neoplasm of upper-outer quadrant of right female breast: Secondary | ICD-10-CM

## 2021-03-21 DIAGNOSIS — Z170 Estrogen receptor positive status [ER+]: Secondary | ICD-10-CM

## 2021-03-21 DIAGNOSIS — Z95828 Presence of other vascular implants and grafts: Secondary | ICD-10-CM

## 2021-03-21 LAB — CMP (CANCER CENTER ONLY)
ALT: 30 U/L (ref 0–44)
AST: 31 U/L (ref 15–41)
Albumin: 4.1 g/dL (ref 3.5–5.0)
Alkaline Phosphatase: 110 U/L (ref 38–126)
Anion gap: 10 (ref 5–15)
BUN: 9 mg/dL (ref 6–20)
CO2: 23 mmol/L (ref 22–32)
Calcium: 9.4 mg/dL (ref 8.9–10.3)
Chloride: 109 mmol/L (ref 98–111)
Creatinine: 0.65 mg/dL (ref 0.44–1.00)
GFR, Estimated: >60 mL/min (ref >60)
Glucose, Bld: 96 mg/dL (ref 70–99)
Potassium: 3.9 mmol/L (ref 3.5–5.1)
Sodium: 142 mmol/L (ref 135–145)
Total Bilirubin: 0.3 mg/dL (ref 0.3–1.2)
Total Protein: 6.8 g/dL (ref 6.5–8.1)

## 2021-03-21 LAB — CBC (CANCER CENTER ONLY)
HCT: 38.6 % (ref 36.0–46.0)
Hemoglobin: 13.1 g/dL (ref 12.0–15.0)
MCH: 30.3 pg (ref 26.0–34.0)
MCHC: 33.9 g/dL (ref 30.0–36.0)
MCV: 89.4 fL (ref 80.0–100.0)
Platelet Count: 330 10*3/uL (ref 150–400)
RBC: 4.32 MIL/uL (ref 3.87–5.11)
RDW: 12.2 % (ref 11.5–15.5)
WBC Count: 3.5 10*3/uL — ABNORMAL LOW (ref 4.0–10.5)
nRBC: 0 % (ref 0.0–0.2)

## 2021-03-21 MED ORDER — SODIUM CHLORIDE 0.9% FLUSH
10.0000 mL | Freq: Once | INTRAVENOUS | Status: AC
  Administered 2021-03-21: 10 mL

## 2021-03-21 MED ORDER — HEPARIN SOD (PORK) LOCK FLUSH 100 UNIT/ML IV SOLN
500.0000 [IU] | Freq: Once | INTRAVENOUS | Status: AC | PRN
  Administered 2021-03-21: 500 [IU]

## 2021-03-21 MED ORDER — SODIUM CHLORIDE 0.9% FLUSH
10.0000 mL | INTRAVENOUS | Status: DC | PRN
  Administered 2021-03-21: 10 mL

## 2021-03-21 MED ORDER — ACETAMINOPHEN 325 MG PO TABS
650.0000 mg | ORAL_TABLET | Freq: Once | ORAL | Status: AC
  Administered 2021-03-21: 650 mg via ORAL
  Filled 2021-03-21: qty 2

## 2021-03-21 MED ORDER — TAMOXIFEN CITRATE 20 MG PO TABS
20.0000 mg | ORAL_TABLET | Freq: Every day | ORAL | 4 refills | Status: AC
  Filled 2021-03-21: qty 90, 90d supply, fill #0
  Filled 2021-09-07: qty 30, 30d supply, fill #0

## 2021-03-21 MED ORDER — SODIUM CHLORIDE 0.9 % IV SOLN: Freq: Once | INTRAVENOUS | Status: AC

## 2021-03-21 MED ORDER — DIPHENHYDRAMINE HCL 25 MG PO CAPS
25.0000 mg | ORAL_CAPSULE | Freq: Once | ORAL | Status: AC
  Administered 2021-03-21: 25 mg via ORAL
  Filled 2021-03-21: qty 1

## 2021-03-21 MED ORDER — TRASTUZUMAB-ANNS CHEMO 150 MG IV SOLR
6.0000 mg/kg | Freq: Once | INTRAVENOUS | Status: AC
  Administered 2021-03-21: 420 mg via INTRAVENOUS
  Filled 2021-03-21: qty 20

## 2021-03-21 NOTE — Progress Notes (Signed)
..  Patient Assist/Replace for the following has been terminated. Medication: Kanjinti (trastuzumab-anns) Reason for Termination: Patient completed treatment on 03/21/2021. Assistance expires on 03/22/2021. Last DOS: 03/21/2021. Marland KitchenJuan Quam, CPhT IV Drug Replacement Specialist [facility] Phone: [phone]

## 2021-03-21 NOTE — Patient Instructions (Signed)
[facility]  Discharge Instructions: ?Thank you for choosing [facility] to provide your oncology and hematology care.  ? ?If you have a lab appointment with the Cancer Center, please go directly to the Cancer Center and check in at the registration area. ?  ?Wear comfortable clothing and clothing appropriate for easy access to any Portacath or PICC line.  ? ?We strive to give you quality time with your provider. You may need to reschedule your appointment if you arrive late (15 or more minutes).  Arriving late affects you and other patients whose appointments are after yours.  Also, if you miss three or more appointments without notifying the office, you may be dismissed from the clinic at the provider?s discretion.    ?  ?For prescription refill requests, have your pharmacy contact our office and allow 72 hours for refills to be completed.   ? ?Today you received the following chemotherapy and/or immunotherapy agents: Trastuzumab    ?  ?To help prevent nausea and vomiting after your treatment, we encourage you to take your nausea medication as directed. ? ?BELOW ARE SYMPTOMS THAT SHOULD BE REPORTED IMMEDIATELY: ?*FEVER GREATER THAN 100.4 F (38 ?C) OR HIGHER ?*CHILLS OR SWEATING ?*NAUSEA AND VOMITING THAT IS NOT CONTROLLED WITH YOUR NAUSEA MEDICATION ?*UNUSUAL SHORTNESS OF BREATH ?*UNUSUAL BRUISING OR BLEEDING ?*URINARY PROBLEMS (pain or burning when urinating, or frequent urination) ?*BOWEL PROBLEMS (unusual diarrhea, constipation, pain near the anus) ?TENDERNESS IN MOUTH AND THROAT WITH OR WITHOUT PRESENCE OF ULCERS (sore throat, sores in mouth, or a toothache) ?UNUSUAL RASH, SWELLING OR PAIN  ?UNUSUAL VAGINAL DISCHARGE OR ITCHING  ? ?Items with * indicate a potential emergency and should be followed up as soon as possible or go to the Emergency Department if any problems should occur. ? ?Please show the CHEMOTHERAPY ALERT CARD or IMMUNOTHERAPY ALERT CARD at check-in  to the Emergency Department and triage nurse. ? ?Should you have questions after your visit or need to cancel or reschedule your appointment, please contact [facility]  Dept: [phone]  and follow the prompts.  Office hours are 8:00 a.m. to 4:30 p.m. Monday - Friday. Please note that voicemails left after 4:00 p.m. may not be returned until the following business day.  We are closed weekends and major holidays. You have access to a nurse at all times for urgent questions. Please call the main number to the clinic Dept: [phone] and follow the prompts. ? ? ?For any non-urgent questions, you may also contact your provider using MyChart. We now offer e-Visits for anyone 18 and older to request care online for non-urgent symptoms. For details visit [email or web address]. ?  ?Also download the MyChart app! Go to the app store, search "MyChart", open the app, select [facility], and log in with your MyChart username and password. ? ?Due to Covid, a mask is required upon entering the hospital/clinic. If you do not have a mask, one will be given to you upon arrival. For doctor visits, patients may have 1 support person aged 18 or older with them. For treatment visits, patients cannot have anyone with them due to current Covid guidelines and our immunocompromised population.  ? ?

## 2021-03-21 NOTE — Progress Notes (Signed)
..  Patient Assist/Replace for the following has been terminated. Medication: Udenyca ([id number]) Reason for Termination: Patient completed treatment on 03/21/2021, assistance no longer needed.  Last DOS: 07/20/2020. Marland KitchenJuan Quam, CPhT IV Drug Replacement Specialist [place] Phone: [phone]

## 2021-03-29 ENCOUNTER — Other Ambulatory Visit (HOSPITAL_COMMUNITY): Payer: Self-pay

## 2021-04-06 ENCOUNTER — Encounter (HOSPITAL_COMMUNITY): Payer: Self-pay | Admitting: Internal Medicine

## 2021-04-06 ENCOUNTER — Ambulatory Visit (HOSPITAL_COMMUNITY): Payer: No Typology Code available for payment source

## 2021-04-25 ENCOUNTER — Telehealth: Payer: Self-pay | Admitting: *Deleted

## 2021-04-25 ENCOUNTER — Other Ambulatory Visit: Payer: Self-pay | Admitting: *Deleted

## 2021-04-25 DIAGNOSIS — K625 Hemorrhage of anus and rectum: Secondary | ICD-10-CM

## 2021-04-25 NOTE — Telephone Encounter (Signed)
This RN placed a GI referral per pt request due to ongoing rectal bleeding per communication with Almyra Free spanish interpreter.  Pt states to [place]- bleeding first was with BM - but now having intermittent rectal bleeding.  She was advised of possible need to proceed to an Urgent Care or ER if symptoms worsens- she verbalized understanding to Almyra Free but due to not having a primary MD is asking if this office could place a referral for GI work up.  This RN placed referral with request for ASAP visit.  This RN will follow up with pt tomorrow regarding appt as well as speak with pt per referral.

## 2021-06-01 ENCOUNTER — Encounter: Payer: Self-pay | Admitting: Genetic Counselor

## 2021-06-11 NOTE — Progress Notes (Addendum)
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  She is not accompanied by an interpreter  She is taking tamoxifen daily and tolerates it well.  She denies any significant side effects due to this.  In particular hot flashes and vaginal wetness are not major issues for her.  She had requested that her port be removed and we had sent her surgeon's note but she tells me that she never heard back from them.  I think it is more likely that they did call but spoke in [place] and that was the reason for the miscommunication.   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  Of course she takes care of her own home and she also cares for some family members.  She is doing some aerobic exercises 90 to 120 minutes most days.  She has lost a little bit of the 20 pounds she gained with chemo but not enough and she wonders if the tamoxifen might be causing that problem.  Aside from that a detailed review of systems today was stable   COVID 19 VACCINATION STATUS: fully vaccinated [employer]), no booster as of September 2022  HISTORY OF CURRENT ILLNESS: From the original intake note:  "Jill [name]" herself palpated a right breast abnormality sometime in 2019.,  She did not pay much mind.  This was noted again during her pregnancy and she  underwent bilateral diagnostic mammography with tomography and right breast ultrasonography at [facility] on 01/21/2020 showing: breast density category C; 4.1 cm irregular mass with associated group of pleomorphic calcifications measuring approximately 4.9 cm, located at site of palpable concern in upper-outer right breast at 9:30; no right axillary lymphadenopathy.  Accordingly on 02/04/2020 she proceeded to biopsy of the right breast area in question. The pathology from this procedure ([id number]) showed: invasive and in situ mammary carcinoma, e-cadherin positive, grade 2. Prognostic indicators significant for: estrogen receptor, 100% positive and progesterone receptor, 100% positive, both with strong staining intensity. Proliferation marker Ki67 at 20%. HER2 equivocal by immunohistochemistry (2+), but positive by fluorescent in situ hybridization with a signals ratio 5.39 and number per cell 11.05.  The patient's subsequent history is as detailed below.   PAST MEDICAL HISTORY: Past Medical History:  Diagnosis Date   Breast cancer ([facility]) 03/07/2020   Infected cyst of Bartholin's gland duct    Medical history non-contributory     PAST SURGICAL HISTORY: Past Surgical History:  Procedure Laterality Date   IR IMAGING GUIDED PORT INSERTION  03/02/2020   MASTECTOMY W/ SENTINEL NODE BIOPSY Right 03/07/2020   Procedure: RIGHT MASTECTOMY WITH SENTINEL LYMPH NODE BIOPSY;  Surgeon: Jovita Kussmaul, MD;  Location: [facility];  Service: General;  Laterality: Right;   NO PAST SURGERIES     WISDOM TOOTH EXTRACTION      FAMILY HISTORY: Family History  Problem Relation Age of Onset  Diabetes Maternal Grandmother   ° Hypertension Mother   °The patient's father is 69 and her mother 63 as of August 2021.  The patient is 3 brothers and 2 sisters.  There is no history of cancer in the family to her knowledge ° ° °GYNECOLOGIC HISTORY:  °No LMP recorded. (Menstrual status: IUD). °Menarche:  45 years old °Age at first live birth: 45 years old °GX P 4 °LMP irregular °Contraceptive: Mirena IUD in place °HRT n/a  °Hysterectomy? no °BSO? 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 Her significant other [name] works installing fences.  They are both from [place].  The patient's children are ages 22, 15, 9, and 11 months as of August 2021.  The older 3 children are from a different father and their last name is Rivas.  (The patient was not married to Mr. Rivas so there is no legal issue regarding access to medical records, etc.).  The oldest daughter has a mild case of cerebral palsy.  The 11-month-old is a child of Jill [name] and [name].  The patient attends a local [employer] church ° ° ADVANCED DIRECTIVES: Not in place ° ° °HEALTH MAINTENANCE: °Social History  ° °Tobacco Use  ° Smoking status: Never  ° Smokeless tobacco: Never  °Vaping Use  ° Vaping Use: Never used  °Substance Use Topics  ° Alcohol use: Never  ° Drug use: Never  ° ° Colonoscopy: n/a (age) ° PAP: 10/2018, negative ° Bone density: n/a (age) °  °No Known Allergies ° °Current Outpatient Medications  °Medication Sig Dispense Refill  ° tamoxifen (NOLVADEX) 20 MG tablet Take 1 tablet (20 mg total) by mouth daily. 90 tablet 4  ° °No current facility-administered medications for this visit.  ° ° °OBJECTIVE: Spanish speaker in no acute distress ° °Vitals:  ° 06/12/21 1030  °BP: 108/64  °Pulse: 70  °Resp: 17  °Temp: 97.9 °F (36.6 °C)  °SpO2: 99%  ° ° °   Body mass index is 32.9 kg/m².   °Wt Readings from Last 3 Encounters:  °06/12/21 157 lb 6.4 oz (71.4 kg)  °03/21/21 162 lb 1.6 oz (73.5 kg)  °03/01/21 163 lb 4 oz (74 kg)  ° °  ECOG FS:1 - Symptomatic but completely ambulatory ° °Sclerae unicteric, EOMs intact °Wearing a mask °No cervical or supraclavicular adenopathy °Lungs no rales or rhonchi °Heart regular rate and rhythm °Abd soft, nontender, positive bowel sounds °MSK no focal spinal tenderness, no upper  extremity lymphedema °Neuro: nonfocal, well oriented, appropriate affect °Breasts: The right breast is status postmastectomy and radiation.  There is no evidence of local recurrence.  The left breast and both axillae are benign ° ° °LAB RESULTS: ° °CMP  °   °Component Value Date/Time  ° NA 142 03/21/2021 1029  ° NA 138 12/15/2019 1543  ° K 3.9 03/21/2021 1029  ° CL 109 03/21/2021 1029  ° CO2 23 03/21/2021 1029  ° GLUCOSE 96 03/21/2021 1029  ° BUN 9 03/21/2021 1029  ° BUN 10 12/15/2019 1543  ° CREATININE 0.65 03/21/2021 1029  ° CALCIUM 9.4 03/21/2021 1029  ° PROT 6.8 03/21/2021 1029  ° PROT 7.3 12/15/2019 1543  ° ALBUMIN 4.1 03/21/2021 1029  ° ALBUMIN 4.5 12/15/2019 1543  ° AST 31 03/21/2021 1029  ° ALT 30 03/21/2021 1029  ° ALKPHOS 110 03/21/2021 1029  ° BILITOT 0.3 03/21/2021 1029  ° GFRNONAA >60 03/21/2021 1029  ° GFRAA >60 03/28/2020 0813  ° GFRAA >60   02/22/2020 1353    No results found for: TOTALPROTELP, ALBUMINELP, A1GS, A2GS, BETS, BETA2SER, GAMS, MSPIKE, SPEI  Lab Results  Component Value Date   WBC 3.3 (L) 06/12/2021   NEUTROABS 1.4 (L) 06/12/2021   HGB 13.1 06/12/2021   HCT 38.0 06/12/2021   MCV 90.5 06/12/2021   PLT 272 06/12/2021    No results found for: LABCA2  No components found for: [id number]  No results for input(s): INR in the last 168 hours.  No results found for: LABCA2  No results found for: NWG956  No results found for: OZH086  No results found for: VHQ469  No results found for: CA2729  No components found for: HGQUANT  No results found for: CEA1 / No results found for: CEA1   No results found for: AFPTUMOR  No results found for: CHROMOGRNA  No results found for: KPAFRELGTCHN, LAMBDASER, KAPLAMBRATIO (kappa/lambda light chains)  Lab Results  Component Value Date   HGBA 97.0 10/10/2018   (Hemoglobinopathy evaluation)   No results found for: LDH  No results found for: IRON, TIBC, IRONPCTSAT (Iron and TIBC)  No results found for:  FERRITIN  Urinalysis    Component Value Date/Time   COLORURINE YELLOW 03/01/2021 1020   APPEARANCEUR CLEAR 03/01/2021 1020   LABSPEC 1.005 03/01/2021 1020   PHURINE 7.0 03/01/2021 1020   GLUCOSEU NEGATIVE 03/01/2021 1020   HGBUR SMALL (A) 03/01/2021 1020   BILIRUBINUR NEGATIVE 03/01/2021 1020   KETONESUR NEGATIVE 03/01/2021 1020   PROTEINUR NEGATIVE 03/01/2021 1020   NITRITE NEGATIVE 03/01/2021 1020   LEUKOCYTESUR NEGATIVE 03/01/2021 1020    STUDIES: No results found.   ELIGIBLE FOR AVAILABLE RESEARCH PROTOCOL: AET  ASSESSMENT: 45 y.o. [place] woman status post right breast upper outer quadrant biopsy 02/04/2020 for a clinical T2 N0, stage IB invasive ductal carcinoma, grade 2, triple positive, with an MIB-1 of 20%  (1) status post right mastectomy and sentinel lymph node sampling 03/07/2020 for a pT2 pN1, stage IB invasive ductal carcinoma, grade 2, with negative margins.  (a) a total of 4 lymph nodes were removed, one positive  (2) adjuvant chemo immunotherapy will consist of carboplatin, docetaxel, trastuzumab and pertuzumab every 21 days x 6 starting 03/29/2020, completed 07/18/2020  (a) pertuzumab discontinued after cycle 2 secondary to diarrhea  (3) trastuzumab continuing to complete a year (last dose 03/21/2021)  (a) echo 03/18/2020 shows an ejection fraction in the 60-65% range  (b) echo 06/29/2020 showed an EF in the 65-70% range  (c) echo 09/28/2020 showed an EF of 55-60%  (d) echo 01/02/2021 shows an ejection fraction in the 60-65% range  (e) echo 03/27/2021  (4) adjuvant radiation 08/17/2020 through 10/03/2020 Site Technique Total Dose (Gy) Dose per Fx (Gy) Completed Fx Beam Energies  Chest Wall, Right: CW_Rt 3D 50.4/50.4 1.8 28/28 6X, 10X  Chest Wall, Right: CW_Rt_SCLV 3D 50.4/50.4 1.8 28/28 6X, 10X  Chest Wall, Right: CW_Rt_Bst Electron 10/10 2 5/5 6E   (5) genetics testing 03/02/2020 through the [facility] Common Hereditary Cancers Panel. . The Common  Hereditary Cancers Panel found no deleterious mutations in APC, ATM, AXIN2, BARD1, BMPR1A, BRCA1, BRCA2, BRIP1, CDH1, CDK4, CDKN2A (p14ARF), CDKN2A (p16INK4a), CHEK2, CTNNA1, DICER1, EPCAM (Deletion/duplication testing only), GREM1 (promoter region deletion/duplication testing only), KIT, MEN1, MLH1, MSH2, MSH3, MSH6, MUTYH, NBN, NF1, NHTL1, PALB2, PDGFRA, PMS2, POLD1, POLE, PTEN, RAD50, RAD51C, RAD51D, RNF43, SDHB, SDHC, SDHD, SMAD4, SMARCA4. STK11, TP53, TSC1, TSC2, and VHL.  The following genes were evaluated for sequence changes only: SDHA and HOXB13 c.251G>A variant only.   (  a)  a Variant of uncertain significance (VUS) in DICER1 at c.3380T>G (p.Ile1127Ser) was noted  (6) tamoxifen started 02/12/2020 in anticipation of possible surgical delays, held during chemotherapy, resumed February 2022  (7) restaging studies:  (A) head CT with and without contrast 01/30/2021 showed no metastatic disease  (B) chest CT 01/30/2021 shows no evidence of metastatic disease.  There is hepatic steatosis, radiation changes right lung, scattered right upper lobe nodules all less than 0.5 cm  (C) MRI of the total spine 01/27/2021 shows no evidence of metastatic disease.  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  This is favorable.  She is tolerating tamoxifen well and the plan will be to continue that a minimum of 5 years.  She has some aches and pains some of it in the surgical area some of it in her back.  I reassured her that these are benign and I gave her copies of her extensive restaging studies which are very reassuring.  She does have some pulmonary nodules.  These are common and likely benign.  At some point in the future she would likely benefit from repeating a chest CT and the longer we wait to repeated the more informative it will be.  I have sent her  surgery note letting him know that she is ready to have her port removed  Otherwise she will have her left screening mammography in July and see [place] in August 2023.  She knows to call us for any other issue that may develop before then.  Total encounter time 25 minutes.Sarajane Jews C. Magrinat, MD 06/12/21 10:47 AM Medical Oncology and Hematology [facility] [place], [place] [address] Tel. [phone]    Fax. [phone]   I, Wilburn Mylar, am acting as scribe for Dr. Virgie Dad. Magrinat.  I, Lurline Del MD, have reviewed the above documentation for accuracy and completeness, and I agree with the above.   *Total Encounter Time as defined by the Centers for Medicare and Medicaid Services includes, in addition to the face-to-face time of a patient visit (documented in the note above) non-face-to-face time: obtaining and reviewing outside history, ordering and reviewing medications, tests or procedures, care coordination (communications with other health care professionals or caregivers) and documentation in the medical record.

## 2021-06-12 ENCOUNTER — Other Ambulatory Visit: Payer: Self-pay

## 2021-06-12 ENCOUNTER — Other Ambulatory Visit (HOSPITAL_COMMUNITY): Payer: Self-pay

## 2021-06-12 ENCOUNTER — Encounter: Payer: Self-pay | Admitting: Oncology

## 2021-06-12 ENCOUNTER — Inpatient Hospital Stay: Payer: No Typology Code available for payment source | Attending: Oncology | Admitting: Oncology

## 2021-06-12 ENCOUNTER — Inpatient Hospital Stay: Payer: No Typology Code available for payment source

## 2021-06-12 ENCOUNTER — Inpatient Hospital Stay: Payer: Self-pay

## 2021-06-12 VITALS — BP 108/64 | HR 70 | Temp 97.9°F | Resp 17 | Wt 157.4 lb

## 2021-06-12 DIAGNOSIS — Z79810 Long term (current) use of selective estrogen receptor modulators (SERMs): Secondary | ICD-10-CM | POA: Insufficient documentation

## 2021-06-12 DIAGNOSIS — Z170 Estrogen receptor positive status [ER+]: Secondary | ICD-10-CM

## 2021-06-12 DIAGNOSIS — Z9221 Personal history of antineoplastic chemotherapy: Secondary | ICD-10-CM | POA: Insufficient documentation

## 2021-06-12 DIAGNOSIS — Z9011 Acquired absence of right breast and nipple: Secondary | ICD-10-CM | POA: Insufficient documentation

## 2021-06-12 DIAGNOSIS — Z923 Personal history of irradiation: Secondary | ICD-10-CM | POA: Insufficient documentation

## 2021-06-12 DIAGNOSIS — C50411 Malignant neoplasm of upper-outer quadrant of right female breast: Secondary | ICD-10-CM

## 2021-06-12 DIAGNOSIS — Z95828 Presence of other vascular implants and grafts: Secondary | ICD-10-CM

## 2021-06-12 DIAGNOSIS — C773 Secondary and unspecified malignant neoplasm of axilla and upper limb lymph nodes: Secondary | ICD-10-CM | POA: Insufficient documentation

## 2021-06-12 LAB — CMP (CANCER CENTER ONLY)
ALT: 46 U/L — ABNORMAL HIGH (ref 0–44)
AST: 38 U/L (ref 15–41)
Albumin: 3.7 g/dL (ref 3.5–5.0)
Alkaline Phosphatase: 91 U/L (ref 38–126)
Anion gap: 5 (ref 5–15)
BUN: 10 mg/dL (ref 6–20)
CO2: 25 mmol/L (ref 22–32)
Calcium: 8.4 mg/dL — ABNORMAL LOW (ref 8.9–10.3)
Chloride: 111 mmol/L (ref 98–111)
Creatinine: 0.67 mg/dL (ref 0.44–1.00)
GFR, Estimated: >60 mL/min (ref >60)
Glucose, Bld: 98 mg/dL (ref 70–99)
Potassium: 4 mmol/L (ref 3.5–5.1)
Sodium: 141 mmol/L (ref 135–145)
Total Bilirubin: 0.3 mg/dL (ref 0.3–1.2)
Total Protein: 6.3 g/dL — ABNORMAL LOW (ref 6.5–8.1)

## 2021-06-12 LAB — CBC WITH DIFFERENTIAL (CANCER CENTER ONLY)
Abs Immature Granulocytes: 0 10*3/uL (ref 0.00–0.07)
Basophils Absolute: 0 10*3/uL (ref 0.0–0.1)
Basophils Relative: 1 %
Eosinophils Absolute: 0.1 10*3/uL (ref 0.0–0.5)
Eosinophils Relative: 3 %
HCT: 38 % (ref 36.0–46.0)
Hemoglobin: 13.1 g/dL (ref 12.0–15.0)
Immature Granulocytes: 0 %
Lymphocytes Relative: 45 %
Lymphs Abs: 1.5 10*3/uL (ref 0.7–4.0)
MCH: 31.2 pg (ref 26.0–34.0)
MCHC: 34.5 g/dL (ref 30.0–36.0)
MCV: 90.5 fL (ref 80.0–100.0)
Monocytes Absolute: 0.3 10*3/uL (ref 0.1–1.0)
Monocytes Relative: 9 %
Neutro Abs: 1.4 10*3/uL — ABNORMAL LOW (ref 1.7–7.7)
Neutrophils Relative %: 42 %
Platelet Count: 272 10*3/uL (ref 150–400)
RBC: 4.2 MIL/uL (ref 3.87–5.11)
RDW: 12.7 % (ref 11.5–15.5)
WBC Count: 3.3 10*3/uL — ABNORMAL LOW (ref 4.0–10.5)
nRBC: 0 % (ref 0.0–0.2)

## 2021-06-12 MED ORDER — TAMOXIFEN CITRATE 20 MG PO TABS
20.0000 mg | ORAL_TABLET | Freq: Every day | ORAL | 4 refills | Status: DC
  Filled 2021-06-12: qty 90, 90d supply, fill #0
  Filled 2022-01-25: qty 90, 90d supply, fill #1

## 2021-06-12 MED ORDER — HEPARIN SOD (PORK) LOCK FLUSH 100 UNIT/ML IV SOLN: 500.0000 [IU] | Freq: Once | INTRAVENOUS | Status: DC

## 2021-06-12 MED ORDER — SODIUM CHLORIDE 0.9% FLUSH: 10.0000 mL | Freq: Once | INTRAVENOUS | Status: DC

## 2021-06-16 ENCOUNTER — Telehealth: Payer: Self-pay | Admitting: *Deleted

## 2021-06-23 ENCOUNTER — Other Ambulatory Visit: Payer: Self-pay

## 2021-06-23 ENCOUNTER — Other Ambulatory Visit: Payer: Self-pay | Admitting: *Deleted

## 2021-06-23 ENCOUNTER — Encounter: Payer: Self-pay | Admitting: *Deleted

## 2021-06-23 ENCOUNTER — Inpatient Hospital Stay (HOSPITAL_BASED_OUTPATIENT_CLINIC_OR_DEPARTMENT_OTHER): Payer: Self-pay | Admitting: *Deleted

## 2021-06-23 VITALS — BP 103/54 | HR 74 | Temp 98.9°F | Resp 18 | Ht <= 58 in | Wt 157.0 lb

## 2021-06-23 DIAGNOSIS — C50411 Malignant neoplasm of upper-outer quadrant of right female breast: Secondary | ICD-10-CM

## 2021-06-23 DIAGNOSIS — Z170 Estrogen receptor positive status [ER+]: Secondary | ICD-10-CM

## 2021-06-23 NOTE — Progress Notes (Signed)
°  SCP reviewed and completed. SDOH assessed and completed. Interpreter Jesus [phone] present on language kiosk. Pt denies pain and fatigue. Vital signs are WNL. Pt has mild hotflashes from the Tamoxifen but she is handling it. She exercises regularly. Pt desires to have port removed. I sent message to Dr. Marlou Starks today. I also referred pt to a PCP. She didn't have a primary. She will see Dr. Wynetta Emery on Jan.3,2023 at [facility]. Pt is aware of appointment.Pt will have left breast mammo in July,2023. Overall, she seems to be doing very well.

## 2021-06-27 ENCOUNTER — Other Ambulatory Visit: Payer: Self-pay

## 2021-06-27 ENCOUNTER — Ambulatory Visit: Payer: Self-pay | Attending: Internal Medicine | Admitting: Internal Medicine

## 2021-06-27 ENCOUNTER — Encounter: Payer: Self-pay | Admitting: Internal Medicine

## 2021-06-27 VITALS — BP 122/72 | HR 88 | Resp 16 | Ht 59.0 in | Wt 158.6 lb

## 2021-06-27 DIAGNOSIS — H5713 Ocular pain, bilateral: Secondary | ICD-10-CM

## 2021-06-27 DIAGNOSIS — H538 Other visual disturbances: Secondary | ICD-10-CM

## 2021-06-27 NOTE — Progress Notes (Signed)
Patient ID: Jill [name], female    DOB: [birth year]  MRN: [id number]  CC: Eye Problem   Subjective: Jill [name] Adjutant is a 46 y.o. female who presents for UC visit.  PCP is Jill [name]; last seen 12/2019.  Rosie Fate, from [employer], is with her and interprets.  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  "Feels like I have sand in my eyes." Endorses tearing when she rubs eye.  No sensitivity to light. Started when she began chemo for breast CA in 03/2020; completed 06/2020.  Reports being told by her oncologist that vision symptoms may be due to chemo.  Vision changes have become progressive worse despite completing chemo 1 yr ago.  Never eval by ophthalmology. -  Patient Active Problem List   Diagnosis Date Noted   Hepatic steatosis 02/08/2021   Degenerative disc disease, lumbar 02/08/2021   Port-A-Cath in place 05/11/2020   Genetic testing 03/04/2020   Malignant neoplasm of upper-outer quadrant of right breast in female, estrogen receptor positive ([place]) 02/10/2020   [redacted] weeks gestation of pregnancy 03/16/2019   Normal labor 03/15/2019     Current Outpatient Medications on File Prior to Visit  Medication Sig Dispense Refill   tamoxifen (NOLVADEX) 20 MG tablet Take 1 tablet (20 mg total) by mouth daily. 90 tablet 4   No current facility-administered medications on file prior to visit.    No Known Allergies  Social History   Socioeconomic History   Marital status: Single    Spouse name: Not on file   Number of children: 4   Years of education: Not on file   Highest education level: 6th grade  Occupational History   Not on file  Tobacco Use   Smoking  status: Never   Smokeless tobacco: Never  Vaping Use   Vaping Use: Never used  Substance and Sexual Activity   Alcohol use: Never   Drug use: Never   Sexual activity: Not Currently    Birth control/protection: I.U.D.  Other Topics Concern   Not on file  Social History Narrative   Not on file   Social Determinants of Health   Financial Resource Strain: Low Risk    Difficulty of Paying Living Expenses: Not very hard  Food Insecurity: Food Insecurity Present   Worried About [place] in the Last Year: Often true   Ran Out of Food in the Last Year: Sometimes true  Transportation Needs: No Transportation Needs   Lack of Transportation (Medical): No   Lack of Transportation (Non-Medical): No  Physical Activity: Sufficiently Active   Days of Exercise per Week: 6 days   Minutes of Exercise per Session: 60 min  Stress: No Stress Concern Present   Feeling of Stress : Not at all  Social Connections: Moderately Integrated   Frequency of Communication with Friends and Family: More than three times a week   Frequency of Social Gatherings with Friends and Family: Three times a week   Attends Religious Services: More than 4 times per year   Active Member of Clubs or Organizations: No   Attends Archivist Meetings: Never   Marital Status: Married  Human resources officer Violence: Not At Risk  Fear of Current or Ex-Partner: No   Emotionally Abused: No   Physically Abused: No   Sexually Abused: No    Family History  Problem Relation Age of Onset   Diabetes Maternal Grandmother    Hypertension Mother     Past Surgical History:  Procedure Laterality Date   IR IMAGING GUIDED PORT INSERTION  03/02/2020   MASTECTOMY W/ SENTINEL NODE BIOPSY Right 03/07/2020   Procedure: RIGHT MASTECTOMY WITH SENTINEL LYMPH NODE BIOPSY;  Surgeon: Jovita Kussmaul, MD;  Location: [place];  Service: General;  Laterality: Right;   NO PAST SURGERIES     WISDOM TOOTH EXTRACTION       ROS: Review of Systems Negative except as stated above  PHYSICAL EXAM: BP 122/72    Pulse 88    Resp 16    Ht 4\' 11"  (1.499 m)    Wt 158 lb 9.6 oz (71.9 kg)    SpO2 98%    BMI 32.03 kg/m   Physical Exam General appearance - alert, well appearing, middle-aged Hispanic female and in no distress Mental status - normal mood, behavior, speech, dress, motor activity, and thought processes Eyes -Pink conjunctiva, nonicteric sclera, no conjunctival injection.  EOMI, pupils equal and reactive to light.  Able to sustain closure of the eyelids to resistance.   CMP Latest Ref Rng & Units 06/12/2021 03/21/2021 03/01/2021  Glucose 70 - 99 mg/dL 98 96 89  BUN 6 - 20 mg/dL 10 9 11   Creatinine 0.44 - 1.00 mg/dL 0.67 0.65 0.70  Sodium 135 - 145 mmol/L 141 142 140  Potassium 3.5 - 5.1 mmol/L 4.0 3.9 4.5  Chloride 98 - 111 mmol/L 111 109 107  CO2 22 - 32 mmol/L 25 23 24   Calcium 8.9 - 10.3 mg/dL 8.4(L) 9.4 9.1  Total Protein 6.5 - 8.1 g/dL 6.3(L) 6.8 6.5  Total Bilirubin 0.3 - 1.2 mg/dL 0.3 0.3 0.4  Alkaline Phos 38 - 126 U/L 91 110 88  AST 15 - 41 U/L 38 31 40  ALT 0 - 44 U/L 46(H) 30 36   Lipid Panel     Component Value Date/Time   CHOL 194 12/15/2019 1543   TRIG 158 (H) 12/15/2019 1543   HDL 49 12/15/2019 1543   CHOLHDL 4.0 12/15/2019 1543   LDLCALC 117 (H) 12/15/2019 1543    CBC    Component Value Date/Time   WBC 3.3 (L) 06/12/2021 1012   WBC 3.1 (L) 02/08/2021 0906   RBC 4.20 06/12/2021 1012   HGB 13.1 06/12/2021 1012   HGB 14.4 12/15/2019 1543   HCT 38.0 06/12/2021 1012   HCT 42.4 12/15/2019 1543   PLT 272 06/12/2021 1012   PLT 395 12/15/2019 1543   MCV 90.5 06/12/2021 1012   MCV 91 12/15/2019 1543   MCH 31.2 06/12/2021 1012   MCHC 34.5 06/12/2021 1012   RDW 12.7 06/12/2021 1012   RDW 12.5 12/15/2019 1543   LYMPHSABS 1.5 06/12/2021 1012   MONOABS 0.3 06/12/2021 1012   EOSABS 0.1 06/12/2021 1012   BASOSABS 0.0 06/12/2021 1012    ASSESSMENT AND PLAN: 1. Eye pain,  bilateral 2. Blurred vision, bilateral I recommend that she be seen by an ophthalmologist.  I have submitted a referral to Dr. Katy Fitch.  Patient is not insured.  She hopes to apply for the orange card/cone discount card.  I informed her that she should but that neither of them all for assistance in payment for eye exams.  Recommend that she perhaps  call around to get pricing for various ophthalmologist office so that she can manage her finances accordingly. - Ambulatory referral to Ophthalmology   Patient was given the opportunity to ask questions.  Patient verbalized understanding of the plan and was able to repeat key elements of the plan.   Orders Placed This Encounter  Procedures   Ambulatory referral to Ophthalmology     Requested Prescriptions    No prescriptions requested or ordered in this encounter    Return in about 6 weeks (around 08/08/2021).  Karle Plumber, MD, FACP

## 2021-07-06 ENCOUNTER — Other Ambulatory Visit (HOSPITAL_COMMUNITY): Payer: Self-pay | Admitting: General Surgery

## 2021-07-06 DIAGNOSIS — Z95828 Presence of other vascular implants and grafts: Secondary | ICD-10-CM

## 2021-07-12 ENCOUNTER — Ambulatory Visit (HOSPITAL_COMMUNITY)
Admission: RE | Admit: 2021-07-12 | Discharge: 2021-07-12 | Disposition: A | Discharge status: Home / Self Care | Payer: Self-pay | Source: Ambulatory Visit | Attending: General Surgery | Admitting: General Surgery

## 2021-07-12 DIAGNOSIS — C50919 Malignant neoplasm of unspecified site of unspecified female breast: Secondary | ICD-10-CM | POA: Insufficient documentation

## 2021-07-12 DIAGNOSIS — Z95828 Presence of other vascular implants and grafts: Secondary | ICD-10-CM

## 2021-07-12 DIAGNOSIS — Z9221 Personal history of antineoplastic chemotherapy: Secondary | ICD-10-CM | POA: Insufficient documentation

## 2021-07-12 DIAGNOSIS — Z452 Encounter for adjustment and management of vascular access device: Secondary | ICD-10-CM | POA: Insufficient documentation

## 2021-07-12 HISTORY — PX: IR REMOVAL TUN ACCESS W/ PORT W/O FL MOD SED: IMG2290

## 2021-07-12 MED ORDER — LIDOCAINE HCL 1 % IJ SOLN
INTRAMUSCULAR | Status: AC
  Administered 2021-07-12: 10 mL
  Filled 2021-07-12: qty 20

## 2021-07-12 NOTE — H&P (Addendum)
Chief Complaint: Port Removal  Referring Physician(s): Autumn Messing  Supervising Physician: Aletta Edouard  Patient Status: [facility] - Out-pt  History of Present Illness: Jill [name] is a 46 y.o. female who has completed chemotherapy for breast cancer and is requesting removal of her Port A Cath.  It was initially placed by Dr. Laurence Ferrari on 03/02/20.  Past Medical History:  Diagnosis Date   Breast cancer ([place]) 03/07/2020   Infected cyst of Bartholin's gland duct    Medical history non-contributory     Past Surgical History:  Procedure Laterality Date   IR IMAGING GUIDED PORT INSERTION  03/02/2020   MASTECTOMY W/ SENTINEL NODE BIOPSY Right 03/07/2020   Procedure: RIGHT MASTECTOMY WITH SENTINEL LYMPH NODE BIOPSY;  Surgeon: Jovita Kussmaul, MD;  Location: [place];  Service: General;  Laterality: Right;   NO PAST SURGERIES     WISDOM TOOTH EXTRACTION      Allergies: Patient has no known allergies.  Medications: Prior to Admission medications   Medication Sig Start Date End Date Taking? Authorizing Provider  tamoxifen (NOLVADEX) 20 MG tablet Take 1 tablet (20 mg total) by mouth daily. 06/12/21 09/10/21  Magrinat, Virgie Dad, MD     Family History  Problem Relation Age of Onset   Diabetes Maternal Grandmother    Hypertension Mother     Social History   Socioeconomic History   Marital status: Single    Spouse name: Not on file   Number of children: 4   Years of education: Not on file   Highest education level: 6th grade  Occupational History   Not on file  Tobacco Use   Smoking status: Never   Smokeless tobacco: Never  Vaping Use   Vaping Use: Never used  Substance and Sexual Activity   Alcohol use: Never   Drug use: Never   Sexual activity: Not Currently    Birth control/protection: I.U.D.  Other Topics Concern   Not on file  Social History Narrative   Not on file   Social Determinants of Health   Financial Resource  Strain: Low Risk    Difficulty of Paying Living Expenses: Not very hard  Food Insecurity: Food Insecurity Present   Worried About [place] in the Last Year: Often true   Ran Out of Food in the Last Year: Sometimes true  Transportation Needs: No Transportation Needs   Lack of Transportation (Medical): No   Lack of Transportation (Non-Medical): No  Physical Activity: Sufficiently Active   Days of Exercise per Week: 6 days   Minutes of Exercise per Session: 60 min  Stress: No Stress Concern Present   Feeling of Stress : Not at all  Social Connections: Moderately Integrated   Frequency of Communication with Friends and Family: More than three times a week   Frequency of Social Gatherings with Friends and Family: Three times a week   Attends Religious Services: More than 4 times per year   Active Member of Clubs or Organizations: No   Attends Archivist Meetings: Never   Marital Status: Married     Review of Systems: A 12 point ROS discussed and pertinent positives are indicated in the HPI above.  All other systems are negative.  Review of Systems  Vital Signs: There were no vitals taken for this visit.  Physical Exam Vitals reviewed.  Constitutional:      Appearance: Normal appearance.  Cardiovascular:     Rate and Rhythm: Normal  rate.  Pulmonary:     Effort: Pulmonary effort is normal. No respiratory distress.  Abdominal:     Palpations: Abdomen is soft.  Neurological:     General: No focal deficit present.     Mental Status: She is alert and oriented to person, place, and time.  Psychiatric:        Mood and Affect: Mood normal.        Behavior: Behavior normal.        Thought Content: Thought content normal.        Judgment: Judgment normal.    Imaging: No results found.  Labs:  CBC: Recent Labs    02/08/21 0906 03/01/21 0913 03/21/21 1029 06/12/21 1012  WBC 3.1* 3.5* 3.5* 3.3*  HGB 13.3 13.0 13.1 13.1  HCT 39.5 39.1 38.6 38.0  PLT  301 291 330 272    COAGS: No results for input(s): INR, APTT in the last 8760 hours.  BMP: Recent Labs    02/08/21 0906 03/01/21 0913 03/21/21 1029 06/12/21 1012  NA 144 140 142 141  K 3.9 4.5 3.9 4.0  CL 109 107 109 111  CO2 26 24 23 25   GLUCOSE 106* 89 96 98  BUN 8 11 9 10   CALCIUM 9.0 9.1 9.4 8.4*  CREATININE 0.64 0.70 0.65 0.67  GFRNONAA >60 >60 >60 >60    LIVER FUNCTION TESTS: Recent Labs    02/08/21 0906 03/01/21 0913 03/21/21 1029 06/12/21 1012  BILITOT 0.2* 0.4 0.3 0.3  AST 24 40 31 38  ALT 24 36 30 46*  ALKPHOS 83 88 110 91  PROT 6.4* 6.5 6.8 6.3*  ALBUMIN 3.7 3.7 4.1 3.7    TUMOR MARKERS: No results for input(s): AFPTM, CEA, CA199, CHROMGRNA in the last 8760 hours.  Assessment and Plan:  Completed chemotherapy for breast cancer.  Will proceed with removal of her Port A cath today by Dr. Kathlene Cote.  Risks and benefits of tunneled catheter removal were discussed with the patient including bleeding, infection, damage to adjacent structures, malfunction of the catheter with need for additional procedures.  All of the patient's questions were answered, patient is agreeable to proceed. Consent signed and in chart.  Hospital approved translator present.  Thank you for allowing our service to participate in [facility] 's care.  Electronically Signed: Murrell Redden, PA-C   07/12/2021, 8:39 AM      I spent a total of  15 Minutes  in face to face in clinical consultation, greater than 50% of which was counseling/coordinating care for [facility] Removal.

## 2021-07-26 ENCOUNTER — Telehealth: Payer: Self-pay | Admitting: Clinical

## 2021-07-26 NOTE — Telephone Encounter (Signed)
Integrated Behavioral Health Case Management Referral Note  07/26/2021 Name: Jaquana Geiger MRN: [id number] DOB: 01/10/1976 Marjorie Smolder Rock Nephew is a 46 y.o. year old female who sees Magrinat, Virgie Dad, MD for primary care. LCSW was consulted to assess patient's needs and assist the patient with Financial Difficulties related to low income and lack of health coverage .  Interpreter: Yes.     Interpreter Name & Language: Spanish  Assessment: Patient experiencing financial difficulties related to low income and lack of health coverage. Patient's daughter, Hassan Rowan, is seen by PCP here at [facility]) and CSW referred daughter for [place] application at last visit.  Intervention: Patient called CSW to schedule financial counselor appointment for her daughter, as daughter requires assistance due to her disabilities. Patient would also like to apply for the [facility]. Scheduled patient and daughter with financial counselor at [facility].   Review of patient status, including review of consultants reports, relevant laboratory and other test results, and collaboration with appropriate care team members and the patient's provider was performed as part of comprehensive patient evaluation and provision of services.    Estanislado Emms, [employer] [phone]

## 2021-08-01 ENCOUNTER — Other Ambulatory Visit: Payer: Self-pay

## 2021-08-01 ENCOUNTER — Ambulatory Visit: Payer: Self-pay | Attending: Internal Medicine

## 2021-08-02 ENCOUNTER — Encounter: Payer: Self-pay | Admitting: Oncology

## 2021-08-11 ENCOUNTER — Encounter: Payer: Self-pay | Admitting: Internal Medicine

## 2021-08-11 ENCOUNTER — Encounter: Payer: Self-pay | Admitting: Nurse Practitioner

## 2021-08-11 ENCOUNTER — Ambulatory Visit: Payer: Self-pay | Attending: Nurse Practitioner | Admitting: Nurse Practitioner

## 2021-08-11 VITALS — BP 114/77 | HR 84 | Resp 16 | Wt 153.4 lb

## 2021-08-11 DIAGNOSIS — K649 Unspecified hemorrhoids: Secondary | ICD-10-CM

## 2021-08-11 DIAGNOSIS — R198 Other specified symptoms and signs involving the digestive system and abdomen: Secondary | ICD-10-CM

## 2021-08-11 DIAGNOSIS — G8929 Other chronic pain: Secondary | ICD-10-CM

## 2021-08-11 DIAGNOSIS — R102 Pelvic and perineal pain: Secondary | ICD-10-CM

## 2021-08-11 NOTE — Progress Notes (Signed)
Assessment & Plan:  Jill [name] was seen today for back pain and pelvic pain.  Diagnoses and all orders for this visit:  Chronic pelvic pain in female -     US PELVIC COMPLETE WITH TRANSVAGINAL; Future  Rectal pressure Hemorrhoids, unspecified hemorrhoid type F/U with GI as instructed.  She was referred in December for this    Patient has been counseled on age-appropriate routine health concerns for screening and prevention. These are reviewed and up-to-date. Referrals have been placed accordingly. Immunizations are up-to-date or declined.    Subjective:   Chief Complaint  Patient presents with   Back Pain   Pelvic Pain   HPI Jill [name] 46 y.o. female presents to office today wth complaints of pelvic pain, hemorrhoidal pain and coccyx pain (working with rehab for this).  She has a past medical history of Malignant neoplasm of right breast  TRIPLE POSITIVE and taking tamoxifen (03/07/2020), Infected cyst of Bartholin's gland duct,  And hemorrhoids.    She endorses chronic pelvic pain. Pain is constant and described as sharp and stabbing. She denies constipation and pain is not associated with eating or not eating. States last BM was today. Also notes white sediments in urine when she urinates but states she was told by oncology that this was related to her medications. She denies any symptoms such as dysuria, flank pain or hematuria  Hemorrhoids She was prescribed anusol and hydroxyzine for itching and rectal pressure in the past. She states she still feels like there are times where there is a large ball inside of her rectum. She denies constipation or diarrhea.  I am unable to manually palpate any internal hemorrhoids or visualize any external hemorrhoids today during her office visit.  She still feels discomfort with burning and pain with prolonged sitting.   She had a normal vaginal birth in September 2020.  Denies constipation, diarrhea, history of colon cancer  or IBS. Endorses intermittent rectal bleeding after bowel movements.   Review of Systems  Constitutional:  Negative for fever, malaise/fatigue and weight loss.  HENT: Negative.  Negative for nosebleeds.   Eyes: Negative.  Negative for blurred vision, double vision and photophobia.  Respiratory: Negative.  Negative for cough and shortness of breath.   Cardiovascular: Negative.  Negative for chest pain, palpitations and leg swelling.  Gastrointestinal:  Positive for abdominal pain. Negative for blood in stool, constipation, diarrhea, heartburn, melena, nausea and vomiting.       SEE HPI  Genitourinary: Negative.   Musculoskeletal: Negative.  Negative for myalgias.  Neurological: Negative.  Negative for dizziness, focal weakness, seizures and headaches.  Psychiatric/Behavioral: Negative.  Negative for suicidal ideas.    Past Medical History:  Diagnosis Date   Breast cancer ([place]) 03/07/2020   Infected cyst of Bartholin's gland duct    Medical history non-contributory     Past Surgical History:  Procedure Laterality Date   IR IMAGING GUIDED PORT INSERTION  03/02/2020   IR REMOVAL TUN ACCESS W/ PORT W/O FL MOD SED  07/12/2021   MASTECTOMY W/ SENTINEL NODE BIOPSY Right 03/07/2020   Procedure: RIGHT MASTECTOMY WITH SENTINEL LYMPH NODE BIOPSY;  Surgeon: Jovita Kussmaul, MD;  Location: [place];  Service: General;  Laterality: Right;   NO PAST SURGERIES     WISDOM TOOTH EXTRACTION      Family History  Problem Relation Age of Onset   Diabetes Maternal Grandmother    Hypertension Mother     Social History Reviewed with  no changes to be made today.   Outpatient Medications Prior to Visit  Medication Sig Dispense Refill   tamoxifen (NOLVADEX) 20 MG tablet Take 1 tablet (20 mg total) by mouth daily. 90 tablet 4   No facility-administered medications prior to visit.    No Known Allergies     Objective:    BP 114/77    Pulse 84    Resp 16    Wt 153 lb 6.4 oz (69.6 kg)     SpO2 97%    BMI 30.98 kg/m  Wt Readings from Last 3 Encounters:  08/11/21 153 lb 6.4 oz (69.6 kg)  06/27/21 158 lb 9.6 oz (71.9 kg)  06/23/21 157 lb (71.2 kg)    Physical Exam Vitals and nursing note reviewed.  Constitutional:      Appearance: She is well-developed.  HENT:     Head: Normocephalic and atraumatic.  Cardiovascular:     Rate and Rhythm: Normal rate and regular rhythm.     Heart sounds: Normal heart sounds. No murmur heard.   No friction rub. No gallop.  Pulmonary:     Effort: Pulmonary effort is normal. No tachypnea or respiratory distress.     Breath sounds: Normal breath sounds. No decreased breath sounds, wheezing, rhonchi or rales.  Chest:     Chest wall: No tenderness.  Abdominal:     General: Bowel sounds are normal.     Palpations: Abdomen is soft.  Musculoskeletal:        General: Normal range of motion.     Cervical back: Normal range of motion.  Skin:    General: Skin is warm and dry.  Neurological:     Mental Status: She is alert and oriented to person, place, and time.     Coordination: Coordination normal.  Psychiatric:        Behavior: Behavior normal. Behavior is cooperative.        Thought Content: Thought content normal.        Judgment: Judgment normal.         Patient has been counseled extensively about nutrition and exercise as well as the importance of adherence with medications and regular follow-up. The patient was given clear instructions to go to ER or return to medical center if symptoms don't improve, worsen or new problems develop. The patient verbalized understanding.   Follow-up: Return if symptoms worsen or fail to improve.   Gildardo Pounds, FNP-BC [facility] and [facility] [place], [place]   08/13/2021, 10:23 AM

## 2021-08-13 ENCOUNTER — Encounter: Payer: Self-pay | Admitting: Nurse Practitioner

## 2021-08-18 ENCOUNTER — Other Ambulatory Visit: Payer: Self-pay

## 2021-08-18 ENCOUNTER — Ambulatory Visit (HOSPITAL_COMMUNITY)
Admission: RE | Admit: 2021-08-18 | Discharge: 2021-08-18 | Disposition: A | Discharge status: Home / Self Care | Payer: Self-pay | Source: Ambulatory Visit | Attending: Nurse Practitioner | Admitting: Nurse Practitioner

## 2021-08-18 DIAGNOSIS — G8929 Other chronic pain: Secondary | ICD-10-CM | POA: Insufficient documentation

## 2021-08-18 DIAGNOSIS — R102 Pelvic and perineal pain: Secondary | ICD-10-CM | POA: Insufficient documentation

## 2021-08-18 IMAGING — US US PELVIS COMPLETE WITH TRANSVAGINAL
1 series · 13 of 25 positions shown · non-contrast
Comparison: CT [DATE]

CLINICAL DATA: Chronic pelvic pain

EXAM:
TRANSABDOMINAL AND TRANSVAGINAL ULTRASOUND OF PELVIS
TECHNIQUE: Both transabdominal and transvaginal ultrasound examinations of the
pelvis were performed. Transabdominal technique was performed for
global imaging of the pelvis including uterus, ovaries, adnexal
regions, and pelvic cul-de-sac. It was necessary to proceed with
endovaginal exam following the transabdominal exam to visualize the
uterus endometrium ovaries.

[Series 1: us pelvic complete with transvaginal · 13 of 94 slices shown]
[im 1/94]
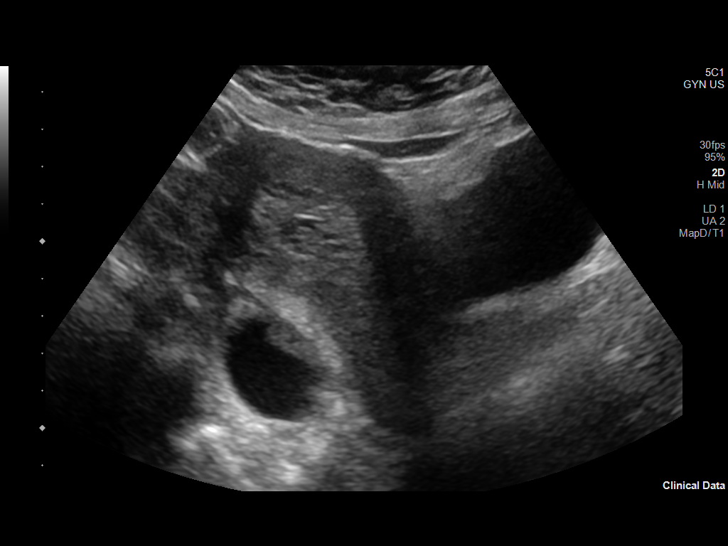
[im 8/94]
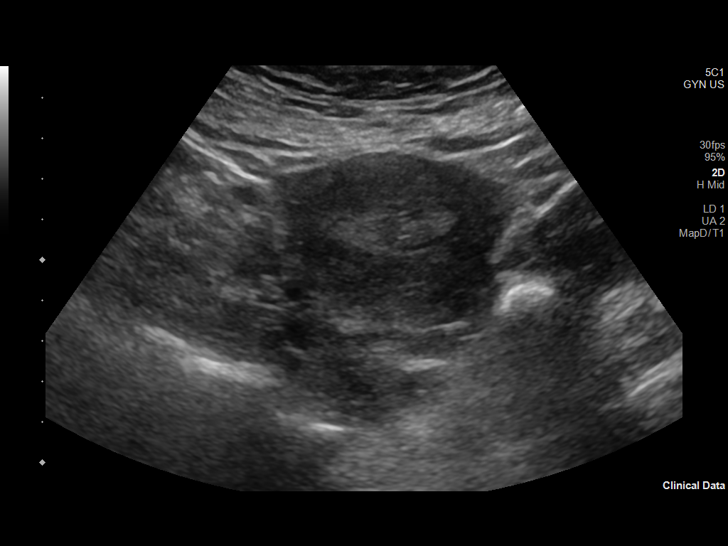
[im 16/94]
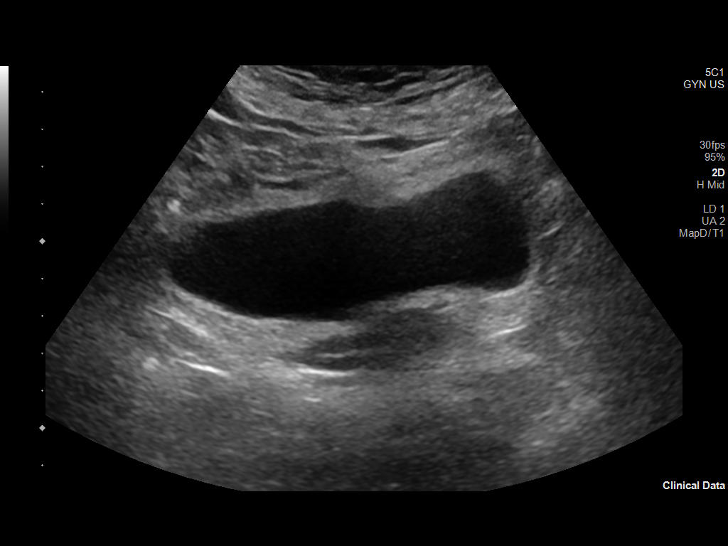
[im 24/94]
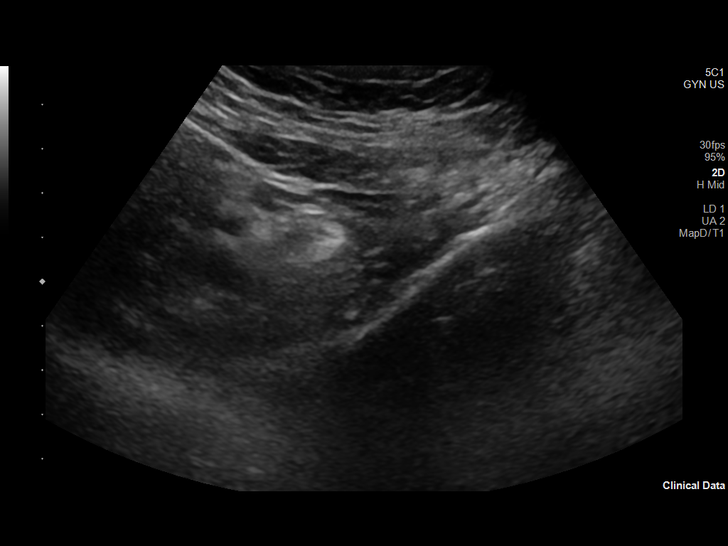
[im 32/94]
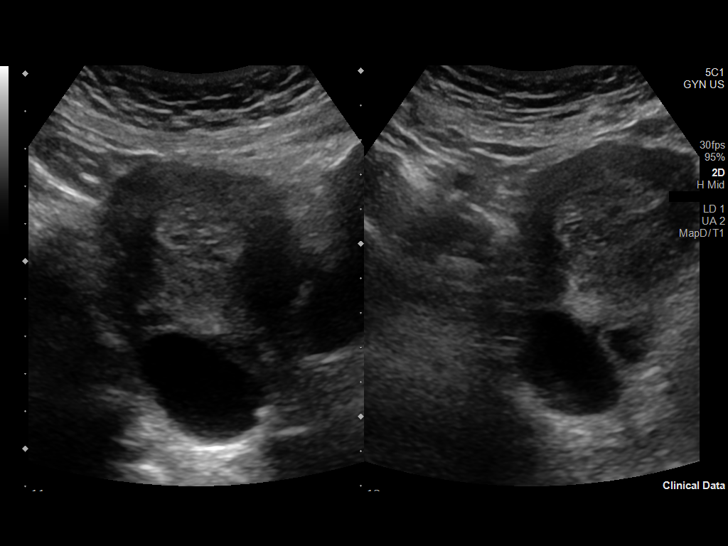
[im 39/94]
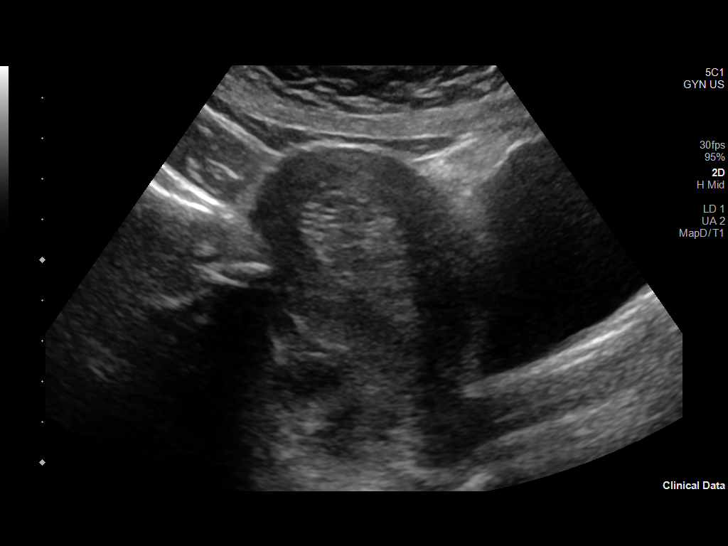
[im 47/94]
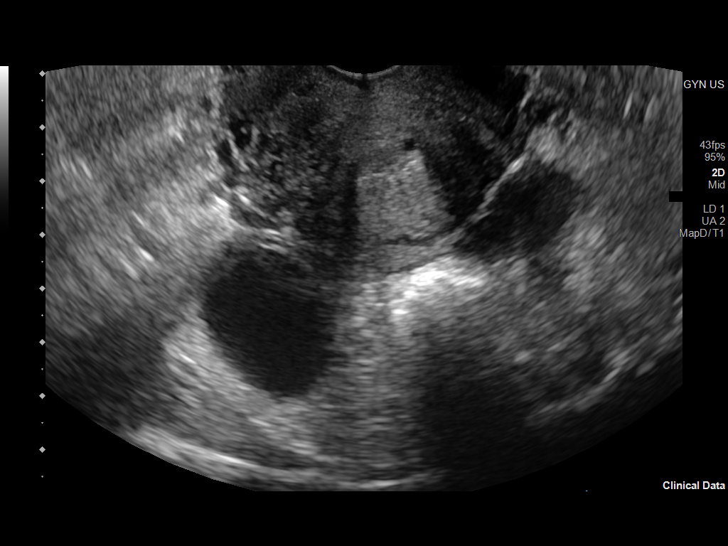
[im 55/94]
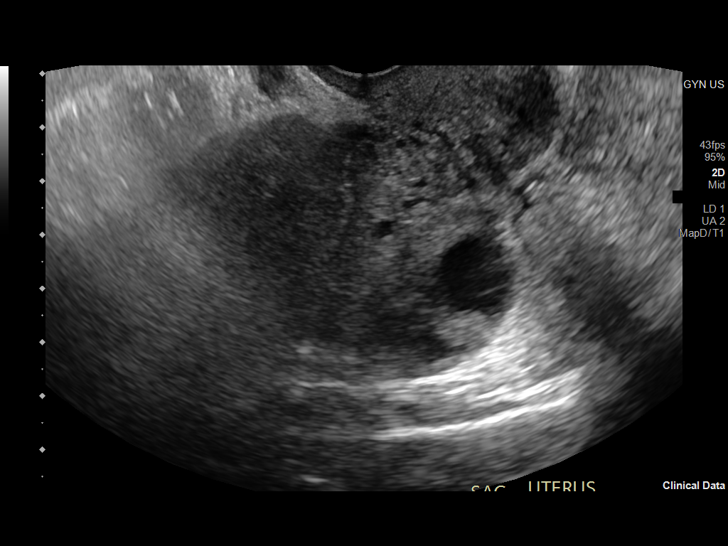
[im 63/94]
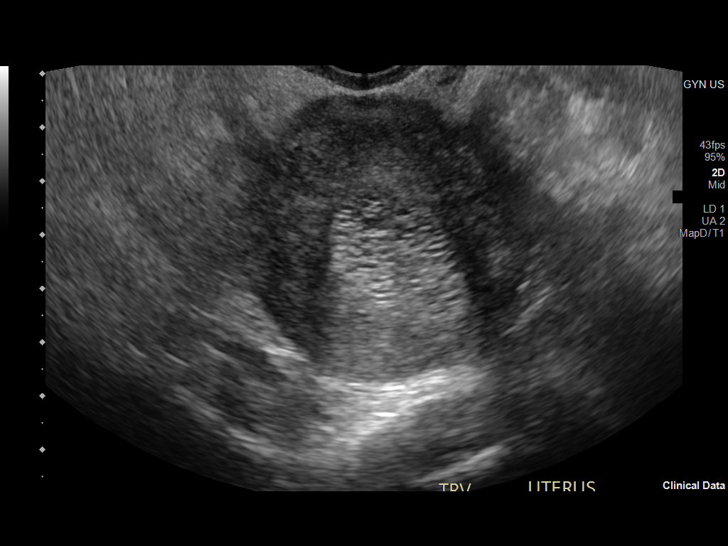
[im 70/94]
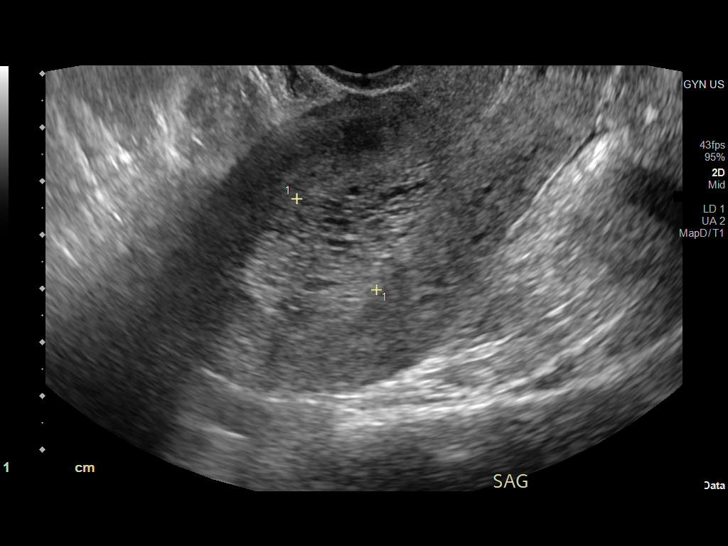
[im 78/94]
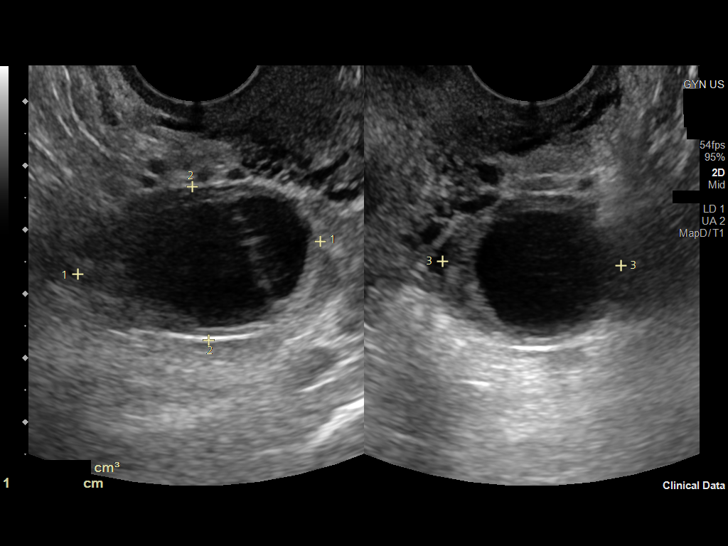
[im 86/94]
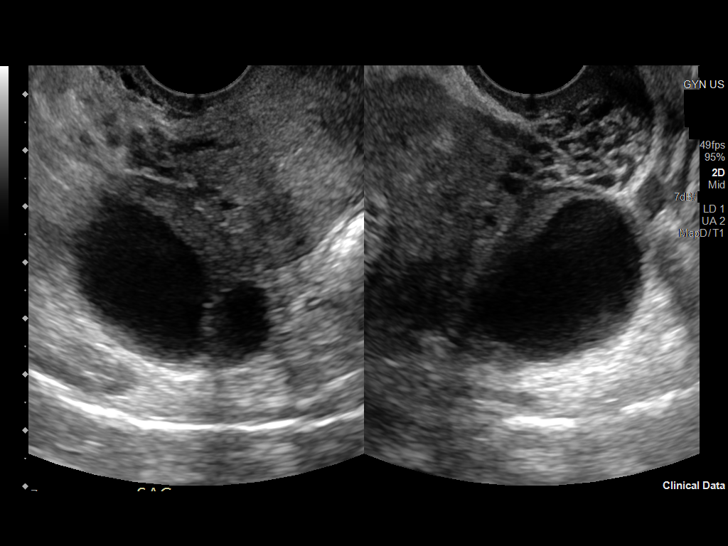
[im 94/94]
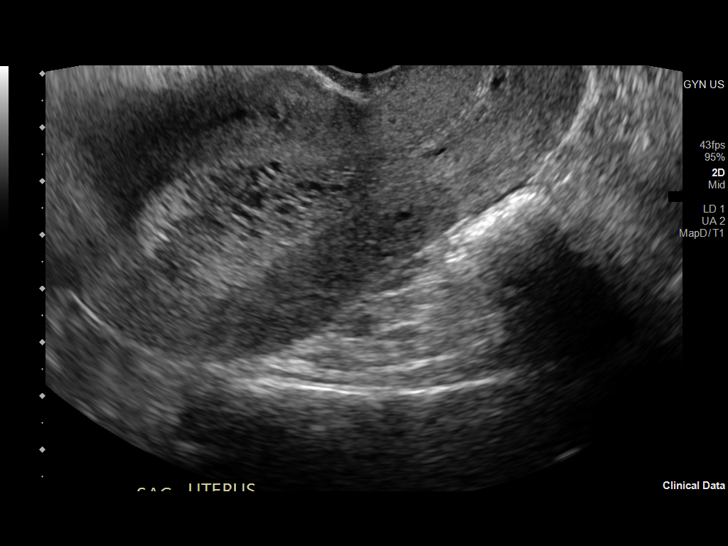

[13 of 25 positions shown; findings below may reference images not displayed]

FINDINGS: Uterus

Measurements: 9.9 x 5 x 5.5 cm = volume: 142.2 mL. No fibroids or
other mass visualized.

Endometrium

Thickness: 22.5 mm. Abnormal in appearance. Heterogeneous
endometrial thickening with multiple tiny cysts.

Right ovary

Measurements: 4.3 x 2.8 x 2.8 cm = volume: 17 mL. Cyst containing
thin septation versus 2 adjacent cysts measuring up to 4.3 cm in
size.

Left ovary

Measurements: 3.8 x 2.4 x 2.8 cm = volume: 13.4 mL. Septated cyst in
the left ovary measuring 2.7 x 1.9 x 2.1 cm

Other findings

No abnormal free fluid.
IMPRESSION: 1. Heterogeneous endometrial thickening up to 22.5 mm with multiple
tiny cystic spaces, suspect findings are related to history of
tamoxifen usage, tissue sampling should be considered, particularly
if there is any history of abnormal uterine bleeding
2. Bilateral ovarian cysts measuring up to 4.3 cm in size on the
right. Cysts are probably benign however consider sonographic
follow-up in 6-12 weeks given history of breast cancer.

## 2021-08-21 ENCOUNTER — Other Ambulatory Visit: Payer: Self-pay | Admitting: Nurse Practitioner

## 2021-08-21 DIAGNOSIS — G8929 Other chronic pain: Secondary | ICD-10-CM

## 2021-08-21 DIAGNOSIS — R9389 Abnormal findings on diagnostic imaging of other specified body structures: Secondary | ICD-10-CM

## 2021-08-21 DIAGNOSIS — R102 Pelvic and perineal pain unspecified side: Secondary | ICD-10-CM

## 2021-08-22 ENCOUNTER — Telehealth: Payer: Self-pay

## 2021-08-22 NOTE — Telephone Encounter (Signed)
Called patient reviewed all information and repeated back to me. Will call if any questions.  With the [facility] # [id number].

## 2021-08-28 ENCOUNTER — Ambulatory Visit: Payer: Self-pay | Admitting: Nurse Practitioner

## 2021-08-29 ENCOUNTER — Encounter: Payer: Self-pay | Admitting: Internal Medicine

## 2021-08-29 ENCOUNTER — Ambulatory Visit (INDEPENDENT_AMBULATORY_CARE_PROVIDER_SITE_OTHER): Payer: Self-pay | Admitting: Internal Medicine

## 2021-08-29 ENCOUNTER — Other Ambulatory Visit (INDEPENDENT_AMBULATORY_CARE_PROVIDER_SITE_OTHER): Payer: Self-pay

## 2021-08-29 VITALS — BP 100/68 | HR 84 | Ht <= 58 in | Wt 151.4 lb

## 2021-08-29 DIAGNOSIS — R7989 Other specified abnormal findings of blood chemistry: Secondary | ICD-10-CM

## 2021-08-29 DIAGNOSIS — K625 Hemorrhage of anus and rectum: Secondary | ICD-10-CM

## 2021-08-29 LAB — IBC + FERRITIN
Ferritin: 86.4 ng/mL (ref 10.0–291.0)
Iron: 42 ug/dL (ref 42–145)
Saturation Ratios: 11.2 % — ABNORMAL LOW (ref 20.0–50.0)
TIBC: 375.2 ug/dL (ref 250.0–450.0)
Transferrin: 268 mg/dL (ref 212.0–360.0)

## 2021-08-29 NOTE — Patient Instructions (Addendum)
Su proveedor le ha pedido que vaya al nivel del s?tano para el trabajo de laboratorio antes de salir hoy. Presione "B" en el ascensor. El laboratorio est? ubicado en la primera puerta a la izquierda al salir del Materials engineer. ? ?Le han programado una colonoscopia. Siga las instrucciones escritas que se le dieron en su visita de hoy. ?Recoja sus suministros de preparaci?n en la farmacia dentro de los pr?ximos 1 a 3 d?as. ?Si [place] inhaladores (aunque solo sea necesario), tr?igalos el d?a de su procedimiento. ? ?Si tiene 56 a?os o menos, su ?ndice de masa corporal debe estar entre 61 y 66. Su ?ndice de masa corporal es de 31,64 kg/m?Marland Kitchen Si esto est? fuera del rango mencionado anteriormente, considere hacer un seguimiento con su proveedor de atenci?n primaria. ? ?________________________________________________________ ? ?Los proveedores de Financial controller GI desean alentarlo a que use MYCHART para comunicarse con los proveedores para solicitudes o preguntas que no sean urgentes. Debido a los Astronomer de espera en el tel?fono, enviar un mensaje a su proveedor por [employer] puede ser una forma m?s r?pida y eficiente de Tax adviser. Espere 48 horas h?biles para obtener [employer]. Recuerde que esto es para solicitudes no urgentes. ?_______________________________________________________ ? ?Your provider has requested that you go to the basement level for lab work before leaving today. Press "B" on the elevator. The lab is located at the first door on the left as you exit the elevator. ? ?You have been scheduled for a colonoscopy. Please follow written instructions given to you at your visit today.  ?Please pick up your prep supplies at the pharmacy within the next 1-3 days. ?If you use inhalers (even only as needed), please bring them with you on the day of your procedure. ? ?If you are age 85 or younger, your body mass index should be between 19-25. Your Body mass index is 31.64 kg/m?Marland Kitchen If this is out of the  aformentioned range listed, please consider follow up with your Primary Care Provider.  ? ?________________________________________________________ ? ?The [place] [facility] providers would like to encourage you to use [facility] to communicate with providers for non-urgent requests or questions.  Due to long hold times on the telephone, sending your provider a message by [facility] may be a faster and more efficient way to get a response.  Please allow 48 business hours for a response.  Please remember that this is for non-urgent requests.  ?_______________________________________________________ ? ?

## 2021-08-29 NOTE — Progress Notes (Signed)
? ?Chief Complaint: Rectal bleeding ? ?HPI : 46 year old female with history of breast cancer presents with rectal bleeding ? ?She started developing rectal bleeding for 8 days in 04/2021, which stopped after she used some over-the-counter suppositories designed for hemorrhoids. When she wipes, she can still feel hemorrhoids on the inside.  She has had issues with hemorrhoids ever since the birth of her most recent child a few years ago. If she is sitting for a long time, she will experience some rectal pain. Denies N&V, weight loss, diarrhea, constipation, dysphagia.  She is eating well. Denies colonoscopy in the past. Denies fam hx of colon cancer.  ? ?Past Medical History:  ?Diagnosis Date  ? Breast cancer ([place]) 03/07/2020  ? Infected cyst of Bartholin's gland duct   ? ?Past Surgical History:  ?Procedure Laterality Date  ? IR IMAGING GUIDED PORT INSERTION  03/02/2020  ? IR REMOVAL TUN ACCESS W/ PORT W/O FL MOD SED  07/12/2021  ? MASTECTOMY W/ SENTINEL NODE BIOPSY Right 03/07/2020  ? Procedure: RIGHT MASTECTOMY WITH SENTINEL LYMPH NODE BIOPSY;  Surgeon: Jovita Kussmaul, MD;  Location: [facility];  Service: General;  Laterality: Right;  ? WISDOM TOOTH EXTRACTION    ? ?Family History  ?Problem Relation Age of Onset  ? Hypertension Mother   ? Diabetes Maternal Grandmother   ? ?Social History  ? ?Tobacco Use  ? Smoking status: Never  ? Smokeless tobacco: Never  ?Vaping Use  ? Vaping Use: Never used  ?Substance Use Topics  ? Alcohol use: Never  ? Drug use: Never  ? ?Current Outpatient Medications  ?Medication Sig Dispense Refill  ? tamoxifen (NOLVADEX) 20 MG tablet Take 1 tablet (20 mg total) by mouth daily. 90 tablet 4  ? ?No current facility-administered medications for this visit.  ? ?No Known Allergies ? ? ?Review of Systems: ?All systems reviewed and negative except where noted in HPI.  ? ?Physical Exam: ?BP 100/68 (BP Location: Left Arm, Patient Position: Sitting, Cuff Size: Normal)   Pulse 84    Ht '4\' 10"'$  (1.473 m)   Wt 151 lb 6 oz (68.7 kg)   BMI 31.64 kg/m?  ?Constitutional: Pleasant,well-developed, female in no acute distress. ?HEENT: Normocephalic and atraumatic. Conjunctivae are normal. No scleral icterus. ?Cardiovascular: Normal rate, regular rhythm.  ?Pulmonary/chest: Effort normal and breath sounds normal. No wheezing, rales or rhonchi. ?Abdominal: Soft, nondistended, nontender. Bowel sounds active throughout. There are no masses palpable. No hepatomegaly. ?Extremities: No edema ?Neurological: Alert and oriented to person place and time. ?Skin: Skin is warm and dry. No rashes noted. ?Psychiatric: Normal mood and affect. Behavior is normal. ? ?Labs 05/2021: CBC with low WBC 3.3 and nml Hb. CMP with mildly elevated ALT of 46.  ? ?CT A/P w/contrast 09/06/20: ?IMPRESSION: ?1. Postsurgical/treatment changes in the right breast with a ?partially visualized fluid collection in the right lateral chest ?wall/axilla, favor seroma. ?2. There are few tiny 4 mm or smaller bilateral pulmonary nodules, ?which are nonspecific. Attention on short interval follow-up with ?dedicated chest CT in 3 months is suggestive. ?3. Hypodense 4 mm lesion in the left lobe of the liver, too small to ?accurately characterize. Attention on follow-up imaging is ?recommended. ? ?ASSESSMENT AND PLAN: ?Rectal bleeding ?Elevated LFTs ?Patient presents with rectal bleeding noted in 04/2021.  I discussed the colonoscopy procedure in detail with the patient, who is agreeable to proceed.  Patient was noted to have mildly elevated ALT on her last LFTs.  This may be due  to fatty liver, but will rule out viral hepatitis, autoimmune hepatitis, and iron overload. ?- Check acute hep panel, IgG, ANA, ASMA, ferritin/IBC ?- Colonoscopy [facility] ? ?Christia Reading, MD ? ?

## 2021-08-31 LAB — IGG: IgG (Immunoglobin G), Serum: 1067 mg/dL (ref 600–1640)

## 2021-08-31 LAB — ANA: Anti Nuclear Antibody (ANA): POSITIVE — AB

## 2021-08-31 LAB — HEPATITIS C ANTIBODY
Hepatitis C Ab: NONREACTIVE
SIGNAL TO CUT-OFF: 0.04 (ref <1.00)

## 2021-08-31 LAB — HEPATITIS B SURFACE ANTIBODY,QUALITATIVE: Hep B S Ab: NONREACTIVE

## 2021-08-31 LAB — ANTI-NUCLEAR AB-TITER (ANA TITER): ANA Titer 1: 1:80 {titer} — ABNORMAL HIGH

## 2021-08-31 LAB — HEPATITIS A ANTIBODY, TOTAL: Hepatitis A AB,Total: REACTIVE — AB

## 2021-08-31 LAB — HEPATITIS B SURFACE ANTIGEN: Hepatitis B Surface Ag: NONREACTIVE

## 2021-09-04 ENCOUNTER — Ambulatory Visit: Payer: Self-pay | Admitting: *Deleted

## 2021-09-04 NOTE — Telephone Encounter (Signed)
Reason for Disposition ? Abdominal pain is a chronic symptom (recurrent or ongoing AND present > 4 weeks) ? ?Answer Assessment - Initial Assessment Questions ?1. LOCATION: "Where does it hurt?"  ?    C/o abd pain.   It might be a cyst.   I had a cyst  on the ultrasound.    ?Geryl Rankins ordered it.   They have not called me.   I called them but she can't see me until May.     ?They told me I have a inflamed cyst on the ultrasound.   They referred me to [facility] hospital.   They can't see me until May 15th.    ?Can I get an appt with Zelda?   ?2. RADIATION: "Does the pain shoot anywhere else?" (e.g., chest, back) ?    *No Answer* ?3. ONSET: "When did the pain begin?" (e.g., minutes, hours or days ago)  ?    *No Answer* ?4. SUDDEN: "Gradual or sudden onset?" ?    *No Answer* ?5. PATTERN "Does the pain come and go, or is it constant?" ?   - If constant: "Is it getting better, staying the same, or worsening?"  ?    (Note: Constant means the pain never goes away completely; most serious pain is constant and it progresses)  ?   - If intermittent: "How long does it last?" "Do you have pain now?" ?    (Note: Intermittent means the pain goes away completely between bouts) ?    *No Answer* ?6. SEVERITY: "How bad is the pain?"  (e.g., Scale 1-10; mild, moderate, or severe) ?  - MILD (1-3): doesn't interfere with normal activities, abdomen soft and not tender to touch  ?  - MODERATE (4-7): interferes with normal activities or awakens from sleep, abdomen tender to touch  ?  - SEVERE (8-10): excruciating pain, doubled over, unable to do any normal activities  ?    *No Answer* ?7. RECURRENT SYMPTOM: "Have you ever had this type of stomach pain before?" If Yes, ask: "When was the last time?" and "What happened that time?"  ?    *No Answer* ?8. CAUSE: "What do you think is causing the stomach pain?" ?    *No Answer* ?9. RELIEVING/AGGRAVATING FACTORS: "What makes it better or worse?" (e.g., movement, antacids, bowel movement) ?     *No Answer* ?10. OTHER SYMPTOMS: "Do you have any other symptoms?" (e.g., back pain, diarrhea, fever, urination pain, vomiting) ?      *No Answer* ?11. PREGNANCY: "Is there any chance you are pregnant?" "When was your last menstrual period?" ?      *No Answer* ? ?Protocols used: Abdominal Pain - Female-A-AH ? ?

## 2021-09-04 NOTE — Telephone Encounter (Signed)
?  Chief Complaint: abd pain.  Had an ultrasound done and was referred to [facility] she has a cyst.   They can't see her until May 15th.   Requesting an appt with Geryl Rankins, NP ?Symptoms: abd pain ?Frequency: N/A ?Pertinent Negatives: Patient denies N/A ?Disposition: '[]'$ ED /'[]'$ Urgent Care (no appt availability in office) / '[]'$ Appointment(In office/virtual)/ '[]'$  [facility]/ '[]'$ Home Care/ '[]'$ Refused Recommended Disposition /'[x]'$ [facility]/ '[]'$  Follow-up with PCP ?Additional Notes: No appts with Geryl Rankins, NP until May 17th.   I suggested the [facility] which she was agreeable to going tomorrow.  (They are not in [place] today).   I gave her the information for their location, etc.   ?I'm sending a note to [facility] to see if she can be worked in.  ?

## 2021-09-05 LAB — ANTI-SMOOTH MUSCLE AB BY IFA: Anti-Smooth Muscle Ab by IFA: <1:20 {titer}

## 2021-09-06 ENCOUNTER — Other Ambulatory Visit: Payer: Self-pay | Admitting: Nurse Practitioner

## 2021-09-06 DIAGNOSIS — G8929 Other chronic pain: Secondary | ICD-10-CM

## 2021-09-06 MED ORDER — TRAMADOL HCL 50 MG PO TABS
50.0000 mg | ORAL_TABLET | Freq: Three times a day (TID) | ORAL | 0 refills | Status: DC | PRN
  Filled 2021-09-06: qty 30, 10d supply, fill #0

## 2021-09-06 NOTE — Telephone Encounter (Signed)
Called pt stated she has been trying to get a sooner appt but no success. Scheduled appt at [facility] is on May . Pt stated she will try to call again hoping for a sooner appt and will go to the ED if sy worsen.  ?

## 2021-09-06 NOTE — Telephone Encounter (Signed)
I do not have any openings however I [facility]. She can pick this up to see if this will help with her pain.  ?

## 2021-09-06 NOTE — Telephone Encounter (Signed)
Called pt made aware of RX sent to pharmacy 

## 2021-09-07 ENCOUNTER — Other Ambulatory Visit: Payer: Self-pay

## 2021-09-07 ENCOUNTER — Encounter: Payer: Self-pay | Admitting: Oncology

## 2021-10-12 ENCOUNTER — Encounter: Payer: Self-pay | Admitting: Internal Medicine

## 2021-10-12 ENCOUNTER — Ambulatory Visit (AMBULATORY_SURGERY_CENTER): Payer: Self-pay | Admitting: Internal Medicine

## 2021-10-12 VITALS — BP 101/61 | HR 77 | Temp 98.0°F | Resp 16 | Ht <= 58 in | Wt 151.0 lb

## 2021-10-12 DIAGNOSIS — K625 Hemorrhage of anus and rectum: Secondary | ICD-10-CM

## 2021-10-12 DIAGNOSIS — D122 Benign neoplasm of ascending colon: Secondary | ICD-10-CM

## 2021-10-12 MED ORDER — SODIUM CHLORIDE 0.9 % IV SOLN: 500.0000 mL | Freq: Once | INTRAVENOUS | Status: DC

## 2021-10-12 NOTE — Progress Notes (Signed)
Called to room to assist during endoscopic procedure.  Patient ID and intended procedure confirmed with present staff. Received instructions for my participation in the procedure from the performing physician.  

## 2021-10-12 NOTE — Op Note (Signed)
[place] [place] ?Patient Name: Jill [name] ?Procedure Date: 10/12/2021 9:25 AM ?MRN: [id number] ?Endoscopist: Sonny Masters "Christia Reading ,  ?Age: 46 ?Referring MD:  ?Date of Birth: 04-04-1976 ?Gender: Female ?Account #: [id number] ?Procedure:                Colonoscopy ?Indications:              Rectal bleeding ?Medicines:                Monitored Anesthesia Care ?Procedure:                Pre-Anesthesia Assessment: ?                          - Prior to the procedure, a History and Physical  ?                          was performed, and patient medications and  ?                          allergies were reviewed. The patient's tolerance of  ?                          previous anesthesia was also reviewed. The risks  ?                          and benefits of the procedure and the sedation  ?                          options and risks were discussed with the patient.  ?                          All questions were answered, and informed consent  ?                          was obtained. Prior Anticoagulants: The patient has  ?                          taken no previous anticoagulant or antiplatelet  ?                          agents. ASA Grade Assessment: II - A patient with  ?                          mild systemic disease. After reviewing the risks  ?                          and benefits, the patient was deemed in  ?                          satisfactory condition to undergo the procedure. ?                          After obtaining informed consent, the colonoscope  ?  was passed under direct vision. Throughout the  ?                          procedure, the patient's blood pressure, pulse, and  ?                          oxygen saturations were monitored continuously. The  ?                          CF [id number] #[id number] was introduced through the anus  ?                          and advanced to the the terminal ileum. The  ?                          colonoscopy was performed without  difficulty. The  ?                          patient tolerated the procedure well. The quality  ?                          of the bowel preparation was adequate. The terminal  ?                          ileum, ileocecal valve, appendiceal orifice, and  ?                          rectum were photographed. ?Scope In: 9:35:16 AM ?Scope Out: 9:59:33 AM ?Scope Withdrawal Time: 0 hours 14 minutes 37 seconds  ?Total Procedure Duration: 0 hours 24 minutes 17 seconds  ?Findings:                 The terminal ileum appeared normal. ?                          Two sessile polyps were found in the ascending  ?                          colon. The polyps were 2 to 3 mm in size. These  ?                          polyps were removed with a cold snare. Resection  ?                          and retrieval were complete. ?                          Non-bleeding internal hemorrhoids were found during  ?                          retroflexion. ?Complications:            No immediate complications. ?Estimated Blood Loss:     Estimated blood loss was minimal. ?Impression:               -  The examined portion of the ileum was normal. ?                          - Two 2 to 3 mm polyps in the ascending colon,  ?                          removed with a cold snare. Resected and retrieved. ?                          - Non-bleeding internal hemorrhoids. ?Recommendation:           - Discharge patient to home (with escort). ?                          - I suspect that your rectal bleeding is due to  ?                          hemorrhoids. ?                          - Await pathology results. ?                          - The findings and recommendations were discussed  ?                          with the patient. ?Georgian Co,  ?10/12/2021 10:02:55 AM ?

## 2021-10-12 NOTE — Progress Notes (Signed)
? ?GASTROENTEROLOGY PROCEDURE H&P NOTE  ? ?Primary Care Physician: ?Gildardo Pounds, NP ? ? ? ?Reason for Procedure:   Rectal bleeding ? ?Plan:    Colonoscopy ? ?Patient is appropriate for endoscopic procedure(s) in the ambulatory ([facility]) setting. ? ?The nature of the procedure, as well as the risks, benefits, and alternatives were carefully and thoroughly reviewed with the patient. Ample time for discussion and questions allowed. The patient understood, was satisfied, and agreed to proceed.  ? ? ? ?HPI: ?Jill [name] is a 46 y.o. female who presents for colonoscopy for evaluation of rectal bleeding .  Patient was most recently seen in the Gastroenterology Clinic on 08/29/21.  No interval change in medical history since that appointment. Please refer to that note for full details regarding GI history and clinical presentation.  ? ?Past Medical History:  ?Diagnosis Date  ? Breast cancer ([place]) 03/07/2020  ? Infected cyst of Bartholin's gland duct   ? ? ?Past Surgical History:  ?Procedure Laterality Date  ? IR IMAGING GUIDED PORT INSERTION  03/02/2020  ? IR REMOVAL TUN ACCESS W/ PORT W/O FL MOD SED  07/12/2021  ? MASTECTOMY W/ SENTINEL NODE BIOPSY Right 03/07/2020  ? Procedure: RIGHT MASTECTOMY WITH SENTINEL LYMPH NODE BIOPSY;  Surgeon: Jovita Kussmaul, MD;  Location: [facility];  Service: General;  Laterality: Right;  ? WISDOM TOOTH EXTRACTION    ? ? ?Prior to Admission medications   ?Medication Sig Start Date End Date Taking? Authorizing Provider  ?traMADol (ULTRAM) 50 MG tablet Take 1 tablet (50 mg total) by mouth every 8 (eight) hours as needed. 09/06/21  Yes Gildardo Pounds, NP  ? ? ?Current Outpatient Medications  ?Medication Sig Dispense Refill  ? traMADol (ULTRAM) 50 MG tablet Take 1 tablet (50 mg total) by mouth every 8 (eight) hours as needed. 30 tablet 0  ? ?Current Facility-Administered Medications  ?Medication Dose Route Frequency Provider Last Rate Last Admin  ? 0.9 %   sodium chloride infusion  500 mL Intravenous Once Sharyn Creamer, MD      ? ? ?Allergies as of 10/12/2021  ? (No Known Allergies)  ? ? ?Family History  ?Problem Relation Age of Onset  ? Hypertension Mother   ? Diabetes Maternal Grandmother   ? ? ?Social History  ? ?Socioeconomic History  ? Marital status: Single  ?  Spouse name: Not on file  ? Number of children: 4  ? Years of education: Not on file  ? Highest education level: 6th grade  ?Occupational History  ? Occupation: cleaning  ?Tobacco Use  ? Smoking status: Never  ? Smokeless tobacco: Never  ?Vaping Use  ? Vaping Use: Never used  ?Substance and Sexual Activity  ? Alcohol use: Never  ? Drug use: Never  ? Sexual activity: Not Currently  ?  Birth control/protection: I.U.D.  ?Other Topics Concern  ? Not on file  ?Social History Narrative  ? Not on file  ? ?Social Determinants of Health  ? ?Financial Resource Strain: Low Risk   ? Difficulty of Paying Living Expenses: Not very hard  ?Food Insecurity: Food Insecurity Present  ? Worried About Charity fundraiser in the Last Year: Often true  ? Ran Out of Food in the Last Year: Sometimes true  ?Transportation Needs: No Transportation Needs  ? Lack of Transportation (Medical): No  ? Lack of Transportation (Non-Medical): No  ?Physical Activity: Sufficiently Active  ? Days of Exercise per Week: 6 days  ?  Minutes of Exercise per Session: 60 min  ?Stress: No Stress Concern Present  ? Feeling of Stress : Not at all  ?Social Connections: Moderately Integrated  ? Frequency of Communication with Friends and Family: More than three times a week  ? Frequency of Social Gatherings with Friends and Family: Three times a week  ? Attends Religious Services: More than 4 times per year  ? Active Member of Clubs or Organizations: No  ? Attends Archivist Meetings: Never  ? Marital Status: Married  ?Intimate Partner Violence: Not At Risk  ? Fear of Current or Ex-Partner: No  ? Emotionally Abused: No  ? Physically Abused: No  ?  Sexually Abused: No  ? ? ?Physical Exam: ?Vital signs in last 24 hours: ?BP 110/76   Pulse 81   Temp 98 ?F (36.7 ?C) (Temporal)   Ht '4\' 10"'$  (1.473 m)   Wt 151 lb (68.5 kg)   SpO2 100%   BMI 31.56 kg/m?  ?GEN: NAD ?EYE: Sclerae anicteric ?ENT: MMM ?CV: Non-tachycardic ?Pulm: No increased WOB ?GI: Soft ?NEURO:  Alert & Oriented ? ? ?Christia Reading, MD ?[facility] ? ? ?10/12/2021 9:24 AM ? ?

## 2021-10-12 NOTE — Progress Notes (Signed)
VS completed by DT. ? ?Interpreter used today at the [facility] for this pt.  Interpreter's name is- Joaquim Lai ? ? ?Pt's states no medical or surgical changes since previsit or office visit. ? ?

## 2021-10-12 NOTE — Patient Instructions (Addendum)
Handout on polyps provided. ? ?Await pathology results  ? ? ?USTED Lillia Abed PROCEDIMIENTO ENDOSC?PICO HOY EN EL [facility]:   Lea el informe del procedimiento que se le entreg? para cualquier pregunta espec?fica sobre lo que se encontr? Education administrator.  Si el informe del examen no responde a sus preguntas, por favor llame a su gastroenter?logo para aclararlo.  Si usted solicit? que no se le den [employer] de lo que se Estate manager/land agent? en su procedimiento al acompa?ante que le va a cuidar, entonces el informe del procedimiento se ha incluido en un sobre sellado para que usted lo revise despu?s cuando le sea m?s conveniente. ?  ?LO QUE PUEDE ESPERAR: Algunas sensaciones de hinchaz?n en el abdomen.  Puede tener m?s gases de lo normal.  El caminar puede ayudarle a eliminar el aire que se le puso en el tracto gastrointestinal durante el procedimiento y reducir la hinchaz?n.  Si le hicieron una endoscopia inferior (como una colonoscopia o una sigmoidoscopia flexible), podr?a notar manchas de sangre en las heces fecales o en el papel higi?nico.  Si se someti? a una preparaci?n intestinal para su procedimiento, es posible que no tenga una evacuaci?n intestinal normal durante algunos d?as. ?  ?Tenga en cuenta:  Es posible que note un poco de irritaci?n y congesti?n en la nariz o alg?n drenaje.  Esto es debido al ox?geno utilizado durante su procedimiento.  No hay que preocuparse y esto debe desaparecer m?s o menos en un d?a. ?  ?S?NTOMAS PARA REPORTAR INMEDIATAMENTE: ? Despu?s de una endoscopia inferior (colonoscopia o sigmoidoscopia flexible): ? Cantidades excesivas de sangre en las heces fecales ? Sensibilidad significativa o empeoramiento de los dolores abdominales  ? Hinchaz?n aguda del abdomen que antes no ten?a  ? Fiebre de 100?F o m?s ? ? ?Para asuntos urgentes o de emergencia, puede comunicarse con un gastroenter?logo a cualquier hora llamando al [phone]. ? ?DIETA:  Recomendamos una comida peque?a al  principio, pero luego puede continuar con su dieta normal.  Tome muchos l?quidos, Teacher, adult education las bebidas alcoh?licas durante 24 horas.  ?  ?ACTIVIDAD:  Debe planear tomarse las cosas con calma por el resto del d?a y no debe CONDUCIR ni usar maquinaria pesada hasta ma?ana (debido a los medicamentos de sedaci?n Furniture conservator/restorer).   ?  ?SEGUIMIENTO: ?Teacher, music? al n?mero que aparece en su historial al siguiente d?a h?bil de su procedimiento para ver c?mo se siente y para responder cualquier pregunta o inquietud que pueda tener con respecto a la informaci?n que se le dio despu?s del procedimiento. Si no podemos contactarle, le dejaremos un mensaje.  Sin embargo, si se siente bien y no tiene ning?n problema, no es necesario que nos devuelva la llamada.  Asumiremos que ha regresado a sus actividades diarias normales sin incidentes. ?Si se le tomaron algunas biopsias, le contactaremos por tel?fono o por carta en las pr?ximas 3 semanas.  Si no ha sabido [employer] biopsias en el transcurso de 3 semanas, por favor ll?menos al [phone].  ? ?FIRMAS/CONFIDENCIALIDAD: ?Usted y/o el acompa?ante que le cuide han firmado documentos que se ingresar?n en su historial m?dico electr?nico.  Estas firmas atestiguan el hecho de que la informaci?n anterior  ?

## 2021-10-12 NOTE — Progress Notes (Signed)
Report to PACU, RN, vss, BBS= Clear.  

## 2021-10-16 ENCOUNTER — Telehealth: Payer: Self-pay | Admitting: *Deleted

## 2021-10-16 ENCOUNTER — Telehealth: Payer: Self-pay

## 2021-10-16 NOTE — Telephone Encounter (Signed)
?  Follow up Call- ? ? ?  10/12/2021  ?  8:42 AM  ?Call back number  ?Post procedure Call Back phone  # [phone]  ?Permission to leave phone message Yes  ?  ? ?Patient questions: ? ?Do you have a fever, pain , or abdominal swelling? No. ?Pain Score  0 * ? ?Have you tolerated food without any problems? Yes.   ? ?Have you been able to return to your normal activities? Yes.   ? ?Do you have any questions about your discharge instructions: ?Diet   No. ?Medications  No. ?Follow up visit  No. ? ?Do you have questions or concerns about your Care? No. ? ?Actions: ?* If pain score is 4 or above: ?No action needed, pain <4. ?Accessed Spanish interpreter to communicate with pt., to insure accurate assessment. ? ? ?

## 2021-10-19 ENCOUNTER — Encounter: Payer: Self-pay | Admitting: Internal Medicine

## 2021-11-06 ENCOUNTER — Other Ambulatory Visit (HOSPITAL_COMMUNITY)
Admission: RE | Admit: 2021-11-06 | Discharge: 2021-11-06 | Disposition: A | Discharge status: Home / Self Care | Payer: Self-pay | Source: Ambulatory Visit | Attending: Family Medicine | Admitting: Family Medicine

## 2021-11-06 ENCOUNTER — Ambulatory Visit (INDEPENDENT_AMBULATORY_CARE_PROVIDER_SITE_OTHER): Payer: Self-pay | Admitting: Family Medicine

## 2021-11-06 ENCOUNTER — Encounter: Payer: Self-pay | Admitting: Family Medicine

## 2021-11-06 VITALS — BP 130/69 | HR 79 | Wt 144.7 lb

## 2021-11-06 DIAGNOSIS — R9389 Abnormal findings on diagnostic imaging of other specified body structures: Secondary | ICD-10-CM | POA: Insufficient documentation

## 2021-11-06 DIAGNOSIS — C50411 Malignant neoplasm of upper-outer quadrant of right female breast: Secondary | ICD-10-CM

## 2021-11-06 DIAGNOSIS — N83201 Unspecified ovarian cyst, right side: Secondary | ICD-10-CM | POA: Insufficient documentation

## 2021-11-06 DIAGNOSIS — Z170 Estrogen receptor positive status [ER+]: Secondary | ICD-10-CM

## 2021-11-06 DIAGNOSIS — Z3202 Encounter for pregnancy test, result negative: Secondary | ICD-10-CM

## 2021-11-06 DIAGNOSIS — N83202 Unspecified ovarian cyst, left side: Secondary | ICD-10-CM

## 2021-11-06 HISTORY — DX: Unspecified ovarian cyst, right side: N83.201

## 2021-11-06 HISTORY — DX: Abnormal findings on diagnostic imaging of other specified body structures: R93.89

## 2021-11-06 LAB — POCT PREGNANCY, URINE: Preg Test, Ur: NEGATIVE

## 2021-11-06 MED ORDER — TAMOXIFEN CITRATE 20 MG PO TABS: 20.0000 mg | ORAL_TABLET | Freq: Every day | ORAL | Status: DC

## 2021-11-06 NOTE — Assessment & Plan Note (Signed)
Risk factors for endometrial hyperplasia/carcinoma include Tamoxifen use and heavy cycles- for biopsy today ?

## 2021-11-06 NOTE — Assessment & Plan Note (Addendum)
On Tamoxifen without uterine protection--Negative Genetics for BRCA ?

## 2021-11-06 NOTE — Progress Notes (Signed)
? ?Subjective:  ? ? Patient ID: Jill [name] is a 46 y.o. female presenting with Endometrial thickening ? on 11/06/2021 ? ?HPI: ?Patient with h/o Stage 1B Breast cancer ER/PR +ve on Tamoxifen. Reports feeling a heaviness in her pelvis. Worse with sitting or standing or sleeping. It radiates to the legs. Reports that it has limited her bladder capacity. Takes tylenol and ibuprofen. Felt some sharp pain, same as breast cancer pain. Has some labial masses as well. They have been there for several weeks. Has h/o labial cyst in 2019. Cycles have just restarted--since after the u/s, cycles are now regular--used to e irregular and now are heavier. ?Sent for referral for thickened endometrial stripe, and pelvic pain. Also has bilateral ovarian cysts on u/s. ? ?Review of Systems  ?Constitutional:  Negative for chills and fever.  ?Respiratory:  Negative for shortness of breath.   ?Cardiovascular:  Negative for chest pain.  ?Gastrointestinal:  Negative for abdominal pain, nausea and vomiting.  ?Genitourinary:  Negative for dysuria.  ?Skin:  Negative for rash.  ?   ?Objective:  ?  ?Wt 144 lb 11.2 oz (65.6 kg)   LMP 11/02/2021   BMI 30.24 kg/m?  ?Physical Exam ?Exam conducted with a chaperone present.  ?Constitutional:   ?   General: She is not in acute distress. ?   Appearance: She is well-developed.  ?HENT:  ?   Head: Normocephalic and atraumatic.  ?Eyes:  ?   General: No scleral icterus. ?Cardiovascular:  ?   Rate and Rhythm: Normal rate.  ?Pulmonary:  ?   Effort: Pulmonary effort is normal.  ?Abdominal:  ?   Palpations: Abdomen is soft.  ?Musculoskeletal:  ?   Cervical back: Neck supple.  ?Skin: ?   General: Skin is warm and dry.  ?Neurological:  ?   Mental Status: She is alert and oriented to person, place, and time.  ? ? ?U/s images reviewed ?There is a thickened endometrial stripe with cystic appearance measuring 2.2 cm ?There are bilateral ovarian cysts at least one has a septation in  it. ? ?Procedure: ?Patient given informed consent, signed copy in the chart, time out was performed. Appropriate time out taken. . The patient was placed in the lithotomy position and the cervix brought into view with sterile speculum.  Portio of cervix cleansed x 2 with betadine swabs.  A tenaculum was placed in the anterior lip of the cervix.  The uterus was sounded for depth of 9 cm. A pipelle was introduced to into the uterus, suction created,  and an endometrial sample was obtained. All equipment was removed and accounted for.  The patient tolerated the procedure well.  ? ? Patient given post procedure instructions. The patient will return in 2 weeks for results. ? ?   ?Assessment & Plan:  ? ?Problem List Items Addressed This Visit   ? ?  ? Unprioritized  ? Malignant neoplasm of upper-outer quadrant of right breast in female, estrogen receptor positive ([place])  ?  On Tamoxifen without uterine protection--Negative Genetics for BRCA ? ?  ?  ? Relevant Medications  ? tamoxifen (NOLVADEX) 20 MG tablet  ? Thickened endometrium - Primary  ?  Risk factors for endometrial hyperplasia/carcinoma include Tamoxifen use and heavy cycles- for biopsy today ? ?  ?  ? Relevant Orders  ? US PELVIC COMPLETE WITH TRANSVAGINAL  ? Surgical pathology  ? Bilateral ovarian cysts  ?  Check appearance after 6-8 weeks--have re-ordered US. Check Roma. Negative carrier for  BRCA. ? ?  ?  ? Relevant Orders  ? Ovarian Malignancy Risk-ROMA  ? ? ? ?Return in about 4 weeks (around 12/04/2021) for a follow-up. ? ?Donnamae Jude, MD ?11/06/2021 ?8:19 AM ? ? ?

## 2021-11-06 NOTE — Assessment & Plan Note (Addendum)
Check appearance after 6-8 weeks--have re-ordered US. Check Roma. Negative carrier for BRCA. ?

## 2021-11-07 LAB — OVARIAN MALIGNANCY RISK-ROMA
Cancer Antigen (CA) 125: 9.1 U/mL (ref 0.0–38.1)
HE4: 50.1 pmol/L (ref 0.0–63.6)
Postmenopausal ROMA: 0.83
Premenopausal ROMA: 0.73

## 2021-11-07 LAB — POSTMENOPAUSAL INTERP: LOW

## 2021-11-07 LAB — PREMENOPAUSAL INTERP: LOW

## 2021-11-08 LAB — SURGICAL PATHOLOGY

## 2021-11-16 ENCOUNTER — Ambulatory Visit
Admission: RE | Admit: 2021-11-16 | Discharge: 2021-11-16 | Disposition: A | Discharge status: Home / Self Care | Payer: Self-pay | Source: Ambulatory Visit | Attending: Family Medicine | Admitting: Family Medicine

## 2021-11-16 DIAGNOSIS — R9389 Abnormal findings on diagnostic imaging of other specified body structures: Secondary | ICD-10-CM | POA: Insufficient documentation

## 2021-11-16 IMAGING — US US PELVIS COMPLETE WITH TRANSVAGINAL
1 series · 15 of 25 positions shown · non-contrast
Comparison: [DATE]

CLINICAL DATA: Thickened endometrium and ovarian cysts seen in the
previous study

EXAM:
TRANSABDOMINAL AND TRANSVAGINAL ULTRASOUND OF PELVIS
TECHNIQUE: Both transabdominal and transvaginal ultrasound examinations of the
pelvis were performed. Transabdominal technique was performed for
global imaging of the pelvis including uterus, ovaries, adnexal
regions, and pelvic cul-de-sac. It was necessary to proceed with
endovaginal exam following the transabdominal exam to visualize the
endometrium and ovaries.

[Series 1: us pelvis complete · 15 of 121 slices shown]
[im 1/121]
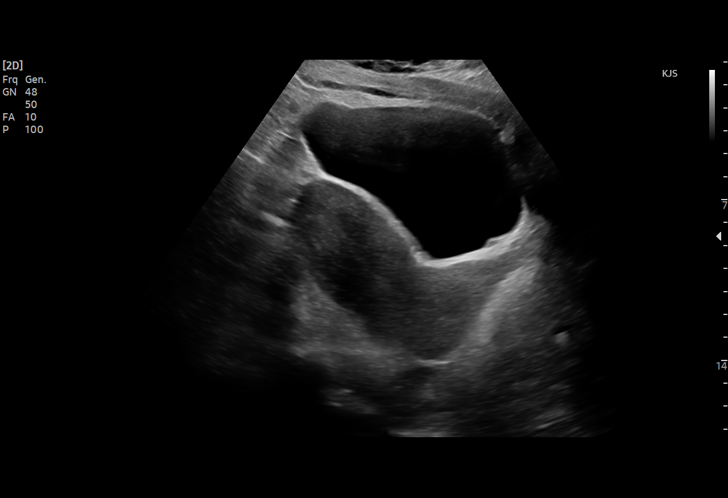
[im 11/121]
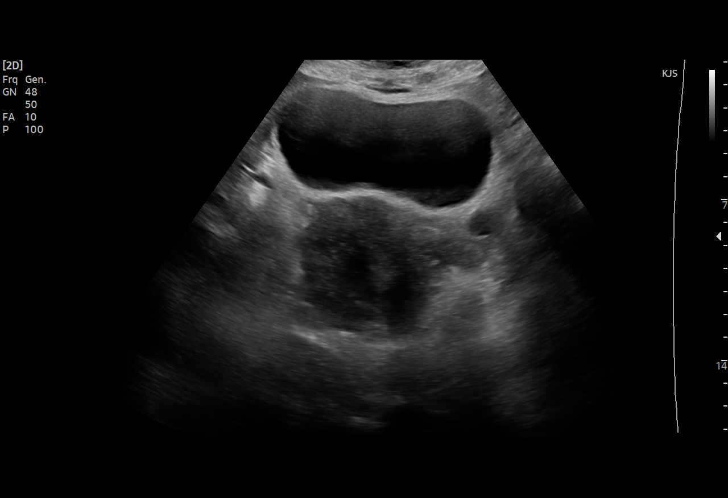
[im 21/121]
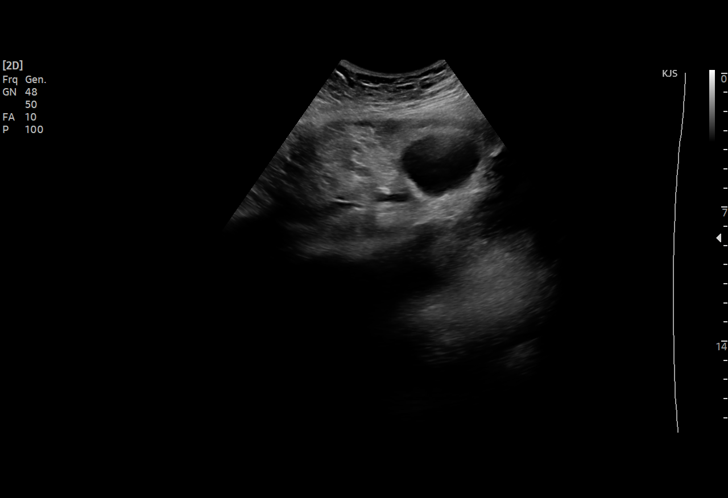
[im 26/121]
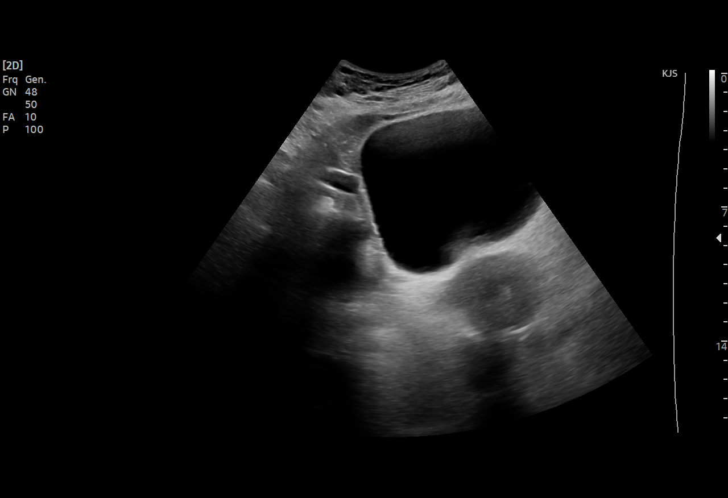
[im 36/121]
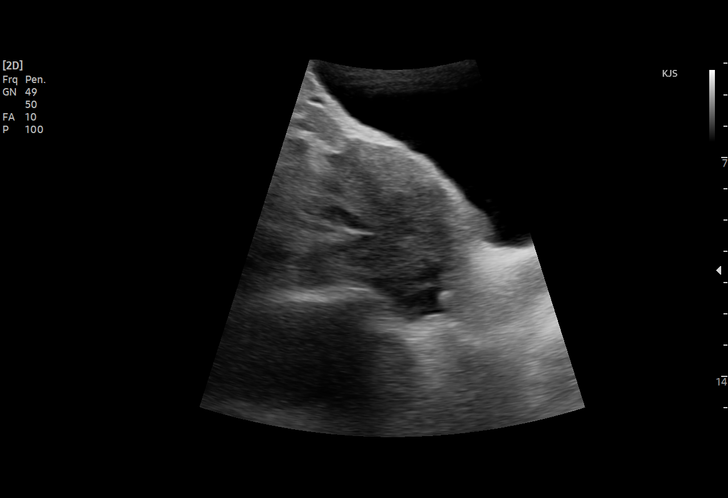
[im 46/121]
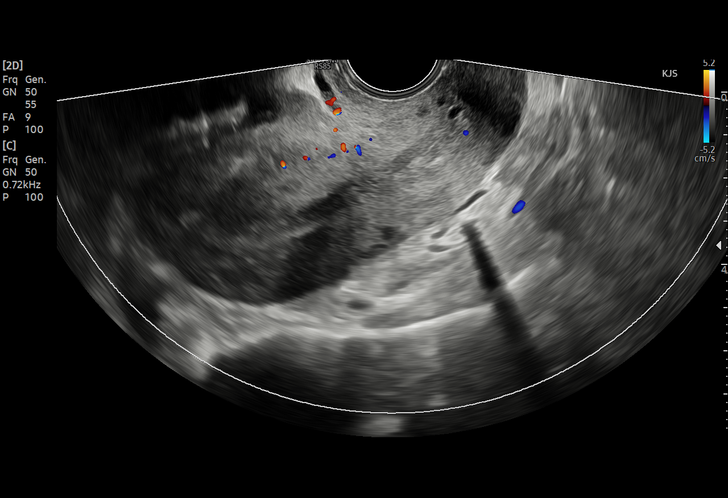
[im 51/121]
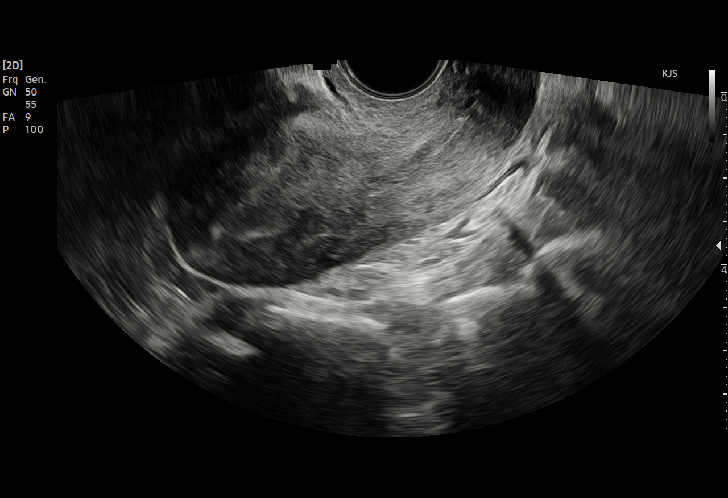
[im 61/121]
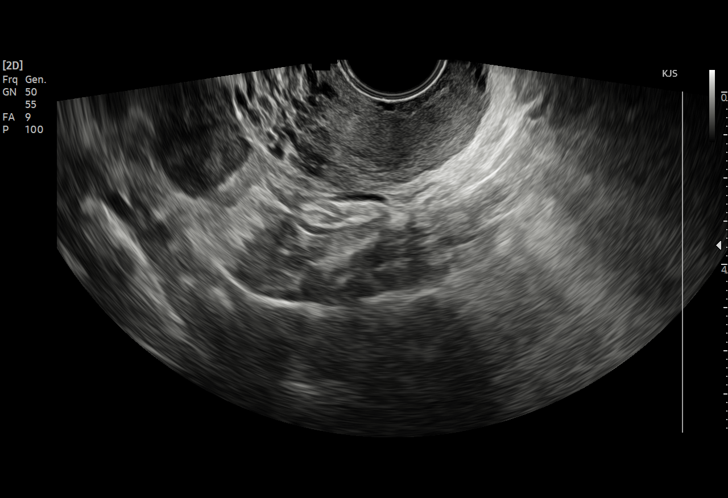
[im 71/121]
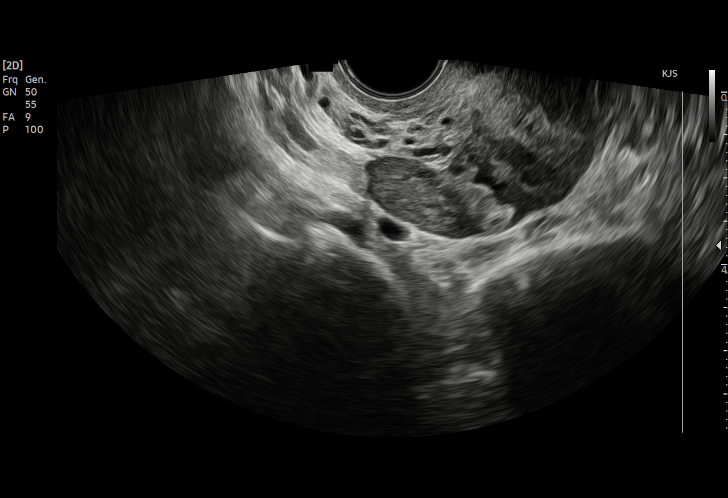
[im 76/121]
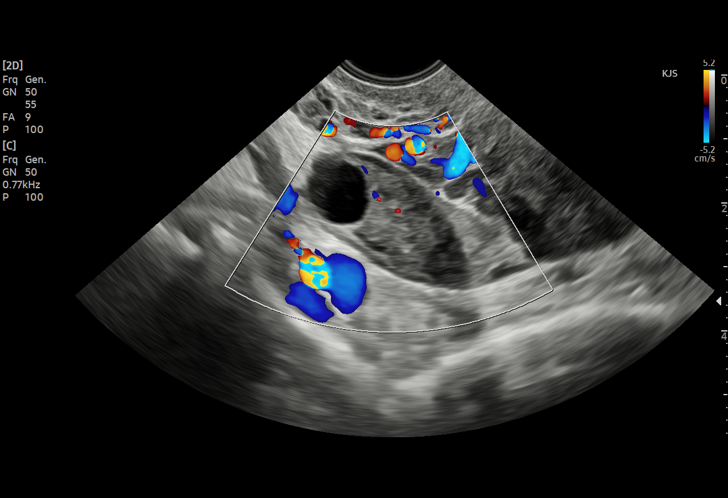
[im 86/121]
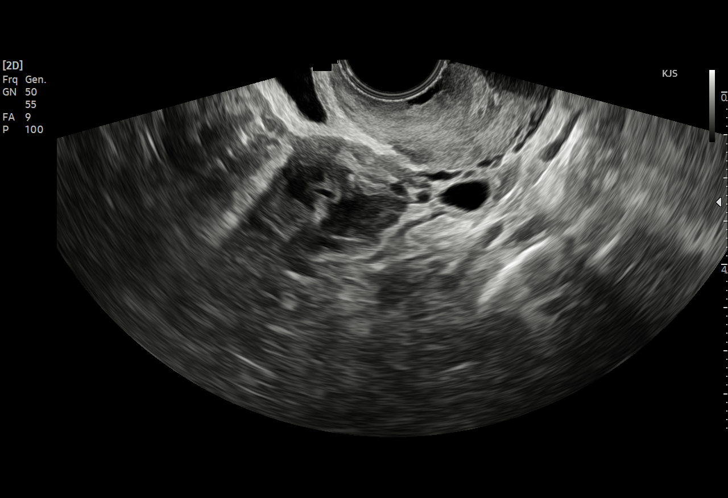
[im 96/121]
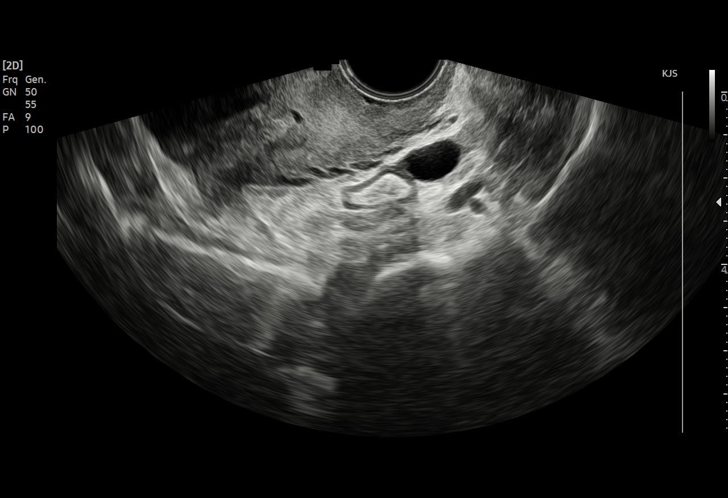
[im 101/121]
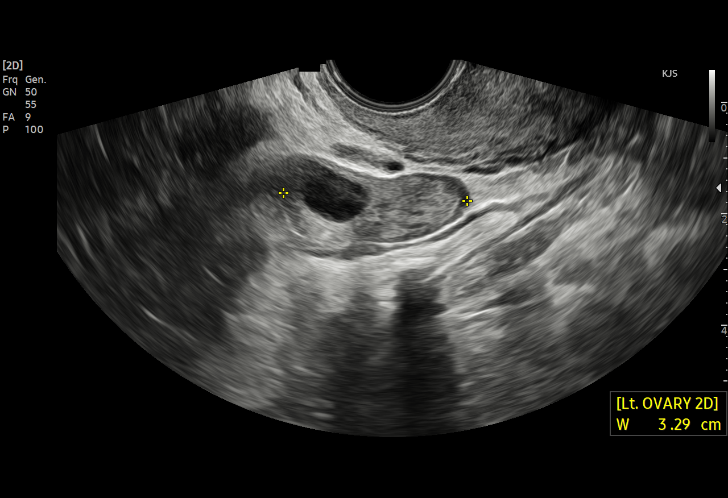
[im 111/121]
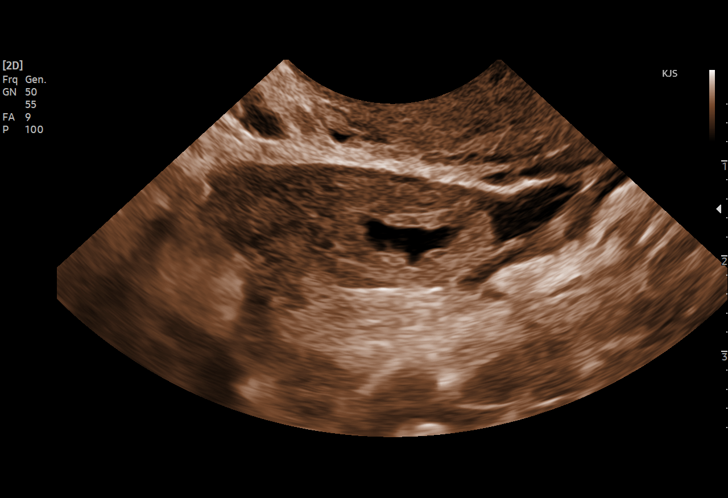
[im 121/121]
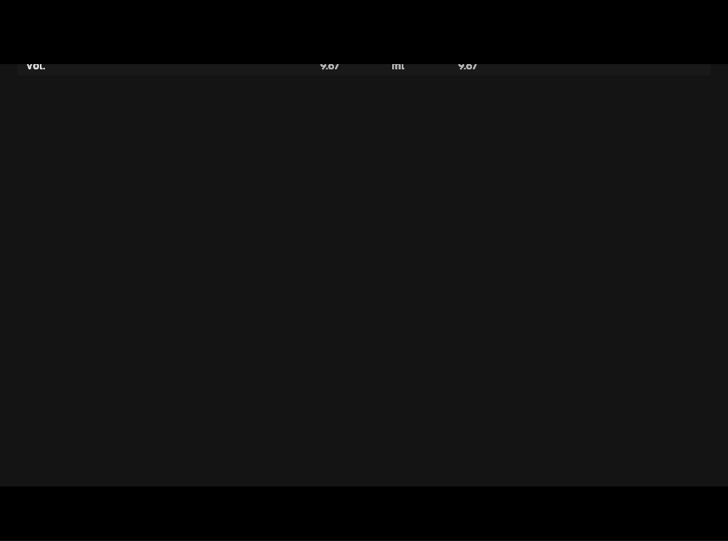

[15 of 25 positions shown; findings below may reference images not displayed]

FINDINGS: Uterus

Measurements: 9.6 x 4.8 x 4.8 cm = volume: 117 mL. There is
inhomogeneous echogenicity in myometrium without discrete nodules.

Endometrium

Thickness: 6.8 mm. There is interval resolution of marked prominence
of endometrial stripe along with tiny cystic spaces since
[DATE]. There is no abnormal increased vascularity in the
endometrium.

Right ovary

Measurements: 3 x 1.4 x 2.3 cm = volume: 5.1 mL. There are few
follicles largest measuring 1 cm in diameter.

Left ovary

Measurements: 3.3 x 1.7 x 3.3 cm = volume: 9.7 mL. There are few
follicles largest measuring 1.4 cm.

Other findings

No abnormal free fluid.
IMPRESSION: There is interval resolution of prominence of endometrium with tiny
cystic spaces since [DATE]. In the current study, there is no
significant thickening of endometrium. There is no abnormal
increased vascularity in the endometrium.

Follicles are seen in both ovaries. There is no free fluid in the
pelvis.

## 2021-11-21 ENCOUNTER — Telehealth: Payer: Self-pay

## 2021-11-21 NOTE — Telephone Encounter (Addendum)
-----   Message from Donnamae Jude, MD sent at 11/17/2021  2:04 PM EDT ----- Please let her know her u/s is now normal appearing.   Called pt to review [place] results with interpreter Raquel. Pt requests results from endometrial biopsy; reviewed normal results with patient. Pt will return to see Kennon Rounds, MD on 12/14/21.

## 2021-11-21 NOTE — Telephone Encounter (Signed)
-----   Message from Donnamae Jude, MD sent at 11/17/2021  2:04 PM EDT ----- Please let her know her u/s is now normal appearing.

## 2021-11-21 NOTE — Telephone Encounter (Signed)
Called Pt using [place] ID# [phone] go over U/S results, rcvd message to call back, did not give option to leave a message.

## 2021-11-23 ENCOUNTER — Other Ambulatory Visit: Payer: Self-pay | Admitting: Obstetrics and Gynecology

## 2021-11-23 DIAGNOSIS — Z1231 Encounter for screening mammogram for malignant neoplasm of breast: Secondary | ICD-10-CM

## 2021-11-28 ENCOUNTER — Other Ambulatory Visit: Payer: Self-pay | Admitting: Oncology

## 2021-12-14 ENCOUNTER — Encounter: Payer: Self-pay | Admitting: Family Medicine

## 2021-12-14 ENCOUNTER — Ambulatory Visit (INDEPENDENT_AMBULATORY_CARE_PROVIDER_SITE_OTHER): Payer: Self-pay | Admitting: Family Medicine

## 2021-12-14 VITALS — BP 110/56 | HR 71 | Wt 147.5 lb

## 2021-12-14 DIAGNOSIS — N83202 Unspecified ovarian cyst, left side: Secondary | ICD-10-CM

## 2021-12-14 DIAGNOSIS — N750 Cyst of Bartholin's gland: Secondary | ICD-10-CM | POA: Insufficient documentation

## 2021-12-14 DIAGNOSIS — N83201 Unspecified ovarian cyst, right side: Secondary | ICD-10-CM

## 2021-12-14 NOTE — Assessment & Plan Note (Signed)
S/p incision and drainage, with Word catheter placement -The catheter will be left in place for at least 6 weeks to promote formation of an epithelialized tract for permanent drainage of glandular secretions.

## 2021-12-14 NOTE — Assessment & Plan Note (Signed)
resolved 

## 2022-01-25 ENCOUNTER — Encounter: Payer: Self-pay | Admitting: Hematology and Oncology

## 2022-01-25 ENCOUNTER — Inpatient Hospital Stay: Payer: Self-pay | Attending: Hematology and Oncology | Admitting: Hematology and Oncology

## 2022-01-25 ENCOUNTER — Other Ambulatory Visit: Payer: Self-pay

## 2022-01-25 ENCOUNTER — Other Ambulatory Visit (HOSPITAL_COMMUNITY): Payer: Self-pay

## 2022-01-25 ENCOUNTER — Encounter: Payer: Self-pay | Admitting: Oncology

## 2022-01-25 VITALS — BP 116/73 | HR 78 | Temp 97.7°F | Resp 18 | Ht <= 58 in | Wt 150.6 lb

## 2022-01-25 DIAGNOSIS — Z79810 Long term (current) use of selective estrogen receptor modulators (SERMs): Secondary | ICD-10-CM | POA: Insufficient documentation

## 2022-01-25 DIAGNOSIS — C50411 Malignant neoplasm of upper-outer quadrant of right female breast: Secondary | ICD-10-CM | POA: Insufficient documentation

## 2022-01-25 DIAGNOSIS — Z9011 Acquired absence of right breast and nipple: Secondary | ICD-10-CM | POA: Insufficient documentation

## 2022-01-25 DIAGNOSIS — Z170 Estrogen receptor positive status [ER+]: Secondary | ICD-10-CM | POA: Insufficient documentation

## 2022-01-25 NOTE — Progress Notes (Signed)
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 She is accompanied by 53 Spanish interpreter today.  She has been taking tamoxifen most of the days, occasionally forgets.  She denies any new complaints.  She has noticed some vaginal discharge with tamoxifen.  No asymmetrical lower extremity swelling, shortness of breath.  No changes in the mastectomy bed reported.  She has her left breast mammogram coming up in 2 weeks.  She struggles still with some sleep issues, has noticed that her near vision has gotten worse. 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  Her daughter (who has a mild case of cerebral palsy) helps out.  Jill [name] tells me her Mirena IUD was removed.  Currently she is not using steady form of contraception.  Generally she feels well.  She would like her port removed.  Detailed review of systems today was otherwise [name].   COVID 19 VACCINATION STATUS: fully vaccinated [employer]), no booster as of September 2022  HISTORY OF CURRENT ILLNESS: From the original intake note:  "Jill [name]" herself palpated a right breast abnormality sometime in 2019.,  She did not pay much mind.  This was noted again during her pregnancy  and she underwent bilateral diagnostic mammography with tomography and right breast ultrasonography at [facility] on 01/21/2020 showing: breast density category C; 4.1 cm irregular mass with associated group of pleomorphic calcifications measuring approximately 4.9 cm, located at site of palpable concern in upper-outer right breast at 9:30; no right axillary lymphadenopathy.  Accordingly on 02/04/2020 she proceeded to biopsy of the right breast area in question. The pathology from this procedure ([id number]) showed: invasive and in situ mammary carcinoma, e-cadherin positive, grade 2. Prognostic indicators significant for: estrogen receptor, 100% positive and progesterone receptor, 100% positive, both with strong staining intensity. Proliferation marker Ki67 at 20%. HER2 equivocal by immunohistochemistry (2+), but positive by fluorescent in situ hybridization with a signals ratio 5.39 and number per cell 11.05.  The patient's subsequent history is as detailed below.   PAST MEDICAL HISTORY: Past Medical History:  Diagnosis Date   Breast cancer ([place]) 03/07/2020   Infected cyst of Bartholin's gland duct     PAST SURGICAL HISTORY: Past Surgical History:  Procedure Laterality Date   IR IMAGING GUIDED PORT INSERTION  03/02/2020   IR REMOVAL TUN ACCESS W/ PORT W/O FL MOD SED  07/12/2021   MASTECTOMY W/ SENTINEL NODE BIOPSY Right 03/07/2020   Procedure: RIGHT MASTECTOMY WITH SENTINEL LYMPH NODE BIOPSY;  Surgeon: Jill Kussmaul, MD;  Location: [facility];  Service: General;  Laterality: Right;   WISDOM TOOTH EXTRACTION      FAMILY HISTORY: Family History  Problem Relation Age of Onset   Hypertension Mother    Diabetes Maternal Grandmother   The patient's father is 78 and her  mother 44 as of August 2021.  The patient is 3 brothers and 2 sisters.  There is no history of cancer in the family to her knowledge   GYNECOLOGIC HISTORY:  No LMP recorded. Menarche: 46 years  old Age at first live birth: 46 years old [place] P 4 LMP irregular Contraceptive: Mirena IUD in place HRT n/a  Hysterectomy? no BSO? no   SOCIAL HISTORY: (updated 01/2020)  Jill [name] works for a [employer] . Her significant other Jill [name] works Occupational hygienist.  They are both from [place].  The patient's children are ages 77, 85, 52, and 62 months as of August 2021.  The older 3 children are from a different father and their last name is Jill [name].  (The patient was not married to Jill [name] so there is no legal issue regarding access to medical records, etc.).  The oldest daughter has a mild case of cerebral palsy.  The 39-monthold is a child of Jill [name] and Jill [name]  The patient attends a [place]: Not in place   HEALTH MAINTENANCE: Social History   Tobacco Use   Smoking status: Never   Smokeless tobacco: Never  Vaping Use   Vaping Use: Never used  Substance Use Topics   Alcohol use: Never   Drug use: Never     Colonoscopy: n/a (age)  PAP: 10/2018, negative  Bone density: n/a (age)   No Known Allergies  Current Outpatient Medications  Medication Sig Dispense Refill   ibuprofen (ADVIL) 100 MG tablet Take 100 mg by mouth every 6 (six) hours as needed for fever. (Patient not taking: Reported on 12/14/2021)     tamoxifen (NOLVADEX) 20 MG tablet Take 1 tablet (20 mg total) by mouth daily.     traMADol (ULTRAM) 50 MG tablet Take 1 tablet (50 mg total) by mouth every 8 (eight) hours as needed. (Patient not taking: Reported on 12/14/2021) 30 tablet 0   No current facility-administered medications for this visit.    OBJECTIVE: Spanish speaker who appears well  There were no vitals filed for this visit.     There is no height or weight on file to calculate BMI.   Wt Readings from Last 3 Encounters:  12/14/21 147 lb 8 oz (66.9 kg)  11/06/21 144 lb 11.2 oz (65.6 kg)  10/12/21 151 lb (68.5 kg)     ECOG FS:1 - Symptomatic but completely  ambulatory  Physical Exam Constitutional:      Appearance: Normal appearance.  HENT:     Head: Normocephalic and atraumatic.  Chest:     Comments: Right breast status postmastectomy.  Left breast normal to inspection and palpation.  No regional adenopathy.   Musculoskeletal:     Cervical back: Normal range of motion and neck supple. No rigidity.  Lymphadenopathy:     Cervical: No cervical adenopathy.  Neurological:     Mental Status: She is alert.       LAB RESULTS:  CMP     Component Value Date/Time   NA 141 06/12/2021 1012   NA 138 12/15/2019 1543   K 4.0 06/12/2021 1012   CL 111 06/12/2021 1012   CO2 25 06/12/2021 1012   GLUCOSE 98 06/12/2021 1012   BUN 10 06/12/2021 1012   BUN 10 12/15/2019 1543   CREATININE 0.67 06/12/2021 1012   CALCIUM 8.4 (L) 06/12/2021 1012   PROT 6.3 (L) 06/12/2021 1012   PROT 7.3 12/15/2019 1543   ALBUMIN 3.7 06/12/2021 1012   ALBUMIN  4.5 12/15/2019 1543   AST 38 06/12/2021 1012   ALT 46 (H) 06/12/2021 1012   ALKPHOS 91 06/12/2021 1012   BILITOT 0.3 06/12/2021 1012   GFRNONAA >60 06/12/2021 1012   GFRAA >60 03/28/2020 0813   GFRAA >60 02/22/2020 1353    No results found for: "TOTALPROTELP", "ALBUMINELP", "A1GS", "A2GS", "BETS", "BETA2SER", "GAMS", "MSPIKE", "SPEI"  Lab Results  Component Value Date   WBC 3.3 (L) 06/12/2021   NEUTROABS 1.4 (L) 06/12/2021   HGB 13.1 06/12/2021   HCT 38.0 06/12/2021   MCV 90.5 06/12/2021   PLT 272 06/12/2021    No results found for: "LABCA2"  No components found for: "OZDGUY403"  No results for input(s): "INR" in the last 168 hours.  No results found for: "LABCA2"  No results found for: "CAN199"  Lab Results  Component Value Date   CAN125 9.1 11/06/2021    No results found for: "KVQ259"  No results found for: "CA2729"  No components found for: "HGQUANT"  No results found for: "CEA1", "CEA" / No results found for: "CEA1", "CEA"   No results found for: "AFPTUMOR"  No results  found for: "CHROMOGRNA"  No results found for: "KPAFRELGTCHN", "LAMBDASER", "KAPLAMBRATIO" (kappa/lambda light chains)  Lab Results  Component Value Date   HGBA 97.0 10/10/2018   (Hemoglobinopathy evaluation)   No results found for: "LDH"  Lab Results  Component Value Date   IRON 42 08/29/2021   TIBC 375.2 08/29/2021   IRONPCTSAT 11.2 (L) 08/29/2021   (Iron and TIBC)  Lab Results  Component Value Date   FERRITIN 86.4 08/29/2021    Urinalysis    Component Value Date/Time   COLORURINE YELLOW 03/01/2021 1020   APPEARANCEUR CLEAR 03/01/2021 1020   LABSPEC 1.005 03/01/2021 1020   PHURINE 7.0 03/01/2021 1020   GLUCOSEU NEGATIVE 03/01/2021 1020   HGBUR SMALL (A) 03/01/2021 1020   BILIRUBINUR NEGATIVE 03/01/2021 1020   KETONESUR NEGATIVE 03/01/2021 1020   PROTEINUR NEGATIVE 03/01/2021 1020   NITRITE NEGATIVE 03/01/2021 [place] 03/01/2021 1020    STUDIES: No results found.   ELIGIBLE FOR AVAILABLE RESEARCH PROTOCOL: AET  ASSESSMENT:   46 y.o. [place] woman status post right breast upper outer quadrant biopsy 02/04/2020 for a clinical T2 N0, stage IB invasive ductal carcinoma, grade 2, triple positive, with an MIB-1 of 20%  (1) status post right mastectomy and sentinel lymph node sampling 03/07/2020 for a pT2 pN1, stage IB invasive ductal carcinoma, grade 2, with negative margins.  (a) a total of 4 lymph nodes were removed, one positive  (2) adjuvant chemo immunotherapy will consist of carboplatin, docetaxel, trastuzumab and pertuzumab every 21 days x 6 starting 03/29/2020, completed 07/18/2020  (a) pertuzumab discontinued after cycle 2 secondary to diarrhea  (3) trastuzumab continuing to complete a year (last dose 03/21/2021)  (a) echo 03/18/2020 shows an ejection fraction in the 60-65% range  (b) echo 06/29/2020 showed an EF in the 65-70% range  (c) echo 09/28/2020 showed an EF of 55-60%  (d) echo 01/02/2021 shows an ejection fraction in the  60-65% range  (e) echo 03/27/2021  (4) adjuvant radiation 08/17/2020 through 10/03/2020 Site Technique Total Dose (Gy) Dose per Fx (Gy) Completed Fx Beam Energies  Chest Wall, Right: CW_Rt 3D 50.4/50.4 1.8 28/28 6X, 10X  Chest Wall, Right: CW_Rt_SCLV 3D 50.4/50.4 1.8 28/28 6X, 10X  Chest Wall, Right: CW_Rt_Bst Electron 10/10 2 5/5 6E   (5) genetics testing 03/02/2020 through the [facility] Common Hereditary Cancers Panel. . The Common Hereditary Cancers  Panel found no deleterious mutations in APC, ATM, AXIN2, BARD1, BMPR1A, BRCA1, BRCA2, BRIP1, CDH1, CDK4, CDKN2A (p14ARF), CDKN2A (p16INK4a), CHEK2, CTNNA1, DICER1, EPCAM (Deletion/duplication testing only), GREM1 (promoter region deletion/duplication testing only), KIT, MEN1, MLH1, MSH2, MSH3, MSH6, MUTYH, NBN, NF1, NHTL1, PALB2, PDGFRA, PMS2, POLD1, POLE, PTEN, RAD50, RAD51C, RAD51D, RNF43, SDHB, SDHC, SDHD, SMAD4, SMARCA4. STK11, TP53, TSC1, TSC2, and VHL.  The following genes were evaluated for sequence changes only: SDHA and HOXB13 c.251G>A variant only.   (a)  a Variant of uncertain significance (VUS) in DICER1 at c.3380T>G (p.Ile1127Ser) was noted  (6) tamoxifen started 02/12/2020 in anticipation of possible surgical delays, held during chemotherapy, resumed February 2022  (7) restaging studies:  (A) head CT with and without contrast 01/30/2021 showed no metastatic disease  (B) chest CT 01/30/2021 shows no evidence of metastatic disease.  There is hepatic steatosis, radiation changes right lung, scattered right upper lobe nodules all less than 0.5 cm  (C) MRI of the total spine 01/27/2021 shows no evidence of metastatic disease.  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  Discussed about being compliant to the medication.  We once again discussed about adverse effects of tamoxifen including risks of DVT/PE.  We have discussed about symptoms and signs of DVT and PE.  She will  proceed with left breast mammogram on August 17 as scheduled.  No physical examination concern for recurrence. She was encouraged to call us with any new questions or concerns.  She will otherwise return to clinic in 6 months.    Total encounter time 7mnutes.*  *Total Encounter Time as defined by the Centers for Medicare and Medicaid Services includes, in addition to the face-to-face time of a patient visit (documented in the note above) non-face-to-face time: obtaining and reviewing outside history, ordering and reviewing medications, tests or procedures, care coordination (communications with other health care professionals or caregivers) and documentation in the medical record.  Patient

## 2022-02-01 ENCOUNTER — Ambulatory Visit: Payer: Self-pay | Admitting: Family Medicine

## 2022-02-01 ENCOUNTER — Encounter: Payer: Self-pay | Admitting: Family Medicine

## 2022-02-01 NOTE — Progress Notes (Signed)
Patient did not keep appointment today. She may call to reschedule.  

## 2022-02-08 ENCOUNTER — Encounter: Payer: Self-pay | Admitting: Obstetrics and Gynecology

## 2022-02-08 ENCOUNTER — Ambulatory Visit
Admission: RE | Admit: 2022-02-08 | Discharge: 2022-02-08 | Disposition: A | Discharge status: Home / Self Care | Payer: No Typology Code available for payment source | Source: Ambulatory Visit | Attending: Obstetrics and Gynecology | Admitting: Obstetrics and Gynecology

## 2022-02-08 ENCOUNTER — Encounter: Payer: Self-pay | Admitting: Oncology

## 2022-02-08 ENCOUNTER — Ambulatory Visit (INDEPENDENT_AMBULATORY_CARE_PROVIDER_SITE_OTHER): Payer: Self-pay | Admitting: Obstetrics and Gynecology

## 2022-02-08 ENCOUNTER — Ambulatory Visit: Payer: Self-pay | Admitting: *Deleted

## 2022-02-08 VITALS — BP 112/65 | HR 75 | Ht <= 58 in | Wt 149.0 lb

## 2022-02-08 VITALS — BP 106/76 | Wt 150.3 lb

## 2022-02-08 DIAGNOSIS — N750 Cyst of Bartholin's gland: Secondary | ICD-10-CM

## 2022-02-08 DIAGNOSIS — Z1231 Encounter for screening mammogram for malignant neoplasm of breast: Secondary | ICD-10-CM

## 2022-02-08 DIAGNOSIS — Z1239 Encounter for other screening for malignant neoplasm of breast: Secondary | ICD-10-CM

## 2022-02-08 NOTE — Progress Notes (Signed)
Jill [name] is a 46 y.o. female who presents to [facility] clinic today with no complaints.    Pap Smear: Pap smear not completed today. Last Pap smear was 09/15/2020 at the [facility] clinic and was normal with negative HPV. Per patient has no history of an abnormal Pap smear. Last Pap smear result is available in Epic.   Physical exam: Breasts Left breast larger than right breast due to patient has a history of a right breast mastectomy for breast cancer 03/07/2020. No skin abnormalities bilateral breasts. No nipple retraction left breast. No nipple discharge left breast. No lymphadenopathy. No lumps palpated bilateral breasts. No complaints of pain or tenderness on exam.  MS DIGITAL DIAG TOMO UNI LEFT  Result Date: 01/10/2021 CLINICAL DATA:  46 year old female with diffuse pain in the UPPER and OUTER LEFT breast, noticed after Port-A-Cath placement in September. History of RIGHT breast cancer and RIGHT mastectomy. EXAM: DIGITAL DIAGNOSTIC UNILATERAL LEFT MAMMOGRAM WITH TOMOSYNTHESIS AND CAD; ULTRASOUND LEFT BREAST LIMITED TECHNIQUE: Left digital diagnostic mammography and breast tomosynthesis was performed. The images were evaluated with computer-aided detection.; Targeted ultrasound examination of the left breast was performed COMPARISON:  Previous exam(s). ACR Breast Density Category b: There are scattered areas of fibroglandular density. FINDINGS: Full field views of the LEFT breast demonstrate no suspicious mass, distortion or worrisome calcifications. Portion of the patient's port is visualized. Targeted ultrasound is performed, showing no sonographic abnormalities within the UPPER-OUTER LEFT breast except for the patient's Port-A-Cath. No abnormal fluid adjacent to the Port-A-Cath is noted. IMPRESSION: 1. No findings to suggest a cause for this patient's diffuse LEFT breast pain. 2. No mammographic evidence of breast malignancy. 3. LEFT Port-A-Cath without adjacent  collection/fluid. RECOMMENDATION: LEFT screening mammogram in 1 year. I have discussed the findings and recommendations with the patient. If applicable, a reminder letter will be sent to the patient regarding the next appointment. BI-RADS CATEGORY  1: Negative. Electronically Signed   By: Margarette Canada M.D.   On: 01/10/2021 11:04  MM CLIP PLACEMENT RIGHT  Result Date: 02/04/2020 CLINICAL DATA:  Evaluate biopsy marker EXAM: DIAGNOSTIC RIGHT MAMMOGRAM POST ULTRASOUND BIOPSY COMPARISON:  Previous exam(s). FINDINGS: Mammographic images were obtained following ultrasound guided biopsy of a right breast mass. The biopsy marking clip is in expected position at the site of biopsy. IMPRESSION: Appropriate positioning of the ribbon shaped biopsy marking clip at the site of biopsy in the biopsied right breast mass. Final Assessment: Post Procedure Mammograms for Marker Placement Electronically Signed   By: Dorise Bullion III M.D   On: 02/04/2020 14:40   MS DIGITAL DIAG TOMO BILAT  Result Date: 01/21/2020 CLINICAL DATA:  Patient presents for palpable abnormality within the right breast. EXAM: DIGITAL DIAGNOSTIC BILATERAL MAMMOGRAM WITH CAD AND TOMO ULTRASOUND RIGHT BREAST COMPARISON:  Previous exam(s). ACR Breast Density Category c: The breast tissue is heterogeneously dense, which may obscure small masses. FINDINGS: There is an irregular mass within the upper-outer right breast underlying the site of palpable concern. Additionally, at the site of palpable concern there is a 4.8 x 2.2 x 2.6 cm group of pleomorphic calcifications, further evaluated with magnification cc and true lateral views. Questioned asymmetry within the medial periareolar right breast predominately effaced with additional imaging suggestive of dense fibroglandular tissue. Mammographic images were processed with CAD. Targeted ultrasound is performed, showing a 2.7 x 4.1 x 1.8 cm irregular hypoechoic mass right breast 9:30 o'clock 5 cm from nipple the  site of palpable concern. No  suspicious abnormality within the medial periareolar right breast. Dense tissue is visualized. No right axillary lymphadenopathy. IMPRESSION: At the site of palpable concern within the upper-outer right breast there is a 4.1 cm irregular hypoechoic mass with associated group of pleomorphic calcifications measuring approximately 4.9 cm in greatest dimension. Overall findings are concerning for malignancy at the site of palpable concern. RECOMMENDATION: Ultrasound-guided core needle biopsy of the right breast mass 9:30 o'clock 5 cm from nipple. Consider bilateral breast MRI given breast density and extent of associated calcifications. If needed clinically, stereotactic guided core needle biopsy of either the anterior or posterior margin can be obtained as needed for surgical planning. I have discussed the findings and recommendations with the patient. If applicable, a reminder letter will be sent to the patient regarding the next appointment. BI-RADS CATEGORY  5: Highly suggestive of malignancy. Electronically Signed   By: Lovey Newcomer M.D.   On: 01/21/2020 11:23    Pelvic/Bimanual Pap is not indicated today per BCCCP guidelines.   Smoking History: Patient has never smoked.   Patient Navigation: Patient education provided. Access to services provided for patient through [employer] program. Used Spanish interpreter Rudene Anda from [facility] provided.   Colorectal Cancer Screening: Per patient has had colonoscopy completed on 10/12/21.  No complaints today.    Breast and Cervical Cancer Risk Assessment: Patient does not have family history of breast cancer. Patient has a personal history of breast cancer. Patient has no known genetic mutations or history of radiation treatment to the chest before age 73. Patient does not have history of cervical dysplasia, immunocompromised, or DES exposure in-utero.  Risk Assessment     Risk Scores       02/08/2022 01/10/2021   Last edited by:  Royston Bake, CMA [place], Sherie Mamie Nick, LPN   5-year risk: 1.2 % 1.1 %   Lifetime risk: 8.9 % 9 %            A: BCCCP exam without pap smear No complaints.  P: Referred patient to the [facility] for a screening mammogram on mobile unit. Appointment scheduled Thursday, February 08, 2022 at 1000.  Loletta Parish, RN 02/08/2022 12:36 PM

## 2022-02-08 NOTE — Patient Instructions (Signed)
Explained breast self awareness with [place]. Patient did not need a Pap smear today due to last Pap smear and HPV typing was 09/15/2020. Let her know BCCCP will cover Pap smears and HPV typing every 5 years unless has a history of abnormal Pap smears. Referred patient to the [facility] for a screening mammogram on mobile unit. Appointment scheduled Thursday, February 08, 2022 at 1000. Patient aware of appointment and will be there. Let patient know the [facility] will follow up with her within the next couple weeks with results of her mammogram by letter or phone. [place] verbalized understanding.  Naysa Puskas, Arvil Chaco, RN 12:36 PM

## 2022-02-08 NOTE — Progress Notes (Signed)
Ms Jill [name] presents for f/u of Bartholin cyst with Word catheter on 12/14/21. Pt reports catheter fell out about 1 week after insertion.  Some discomfort at times at incision site.  PE AF VSS Chaperone present  Lungs clear Heart RRR Abd soft + BS GU Nl EGBUS, word catheter is out, incision site is well healed, cervix without lesions  A/P S/P I & D of Bartholin cyst.  F/U PRN

## 2022-02-12 ENCOUNTER — Other Ambulatory Visit: Payer: Self-pay | Admitting: Obstetrics and Gynecology

## 2022-02-12 DIAGNOSIS — R928 Other abnormal and inconclusive findings on diagnostic imaging of breast: Secondary | ICD-10-CM

## 2022-02-22 ENCOUNTER — Other Ambulatory Visit: Payer: No Typology Code available for payment source

## 2022-02-28 ENCOUNTER — Other Ambulatory Visit: Payer: Self-pay | Admitting: *Deleted

## 2022-03-21 ENCOUNTER — Other Ambulatory Visit: Payer: Self-pay | Admitting: *Deleted

## 2022-03-21 DIAGNOSIS — Z170 Estrogen receptor positive status [ER+]: Secondary | ICD-10-CM

## 2022-03-21 MED ORDER — TAMOXIFEN CITRATE 20 MG PO TABS: 20.0000 mg | ORAL_TABLET | Freq: Every day | ORAL | 3 refills | Status: DC

## 2022-07-18 ENCOUNTER — Telehealth: Payer: Self-pay | Admitting: Hematology and Oncology

## 2022-07-18 NOTE — Telephone Encounter (Signed)
Contacted patient to scheduled appointments. Patient is aware of appointments that are scheduled.   

## 2022-08-02 ENCOUNTER — Inpatient Hospital Stay: Payer: 59 | Attending: Hematology and Oncology | Admitting: Hematology and Oncology

## 2022-08-02 ENCOUNTER — Inpatient Hospital Stay: Payer: 59

## 2022-08-02 ENCOUNTER — Encounter: Payer: Self-pay | Admitting: Oncology

## 2022-08-02 ENCOUNTER — Other Ambulatory Visit: Payer: Self-pay

## 2022-08-02 ENCOUNTER — Encounter: Payer: Self-pay | Admitting: Hematology and Oncology

## 2022-08-02 VITALS — BP 111/66 | HR 87 | Temp 97.9°F | Resp 16 | Ht <= 58 in | Wt 161.5 lb

## 2022-08-02 DIAGNOSIS — Z170 Estrogen receptor positive status [ER+]: Secondary | ICD-10-CM

## 2022-08-02 DIAGNOSIS — Z9011 Acquired absence of right breast and nipple: Secondary | ICD-10-CM | POA: Insufficient documentation

## 2022-08-02 DIAGNOSIS — Z79810 Long term (current) use of selective estrogen receptor modulators (SERMs): Secondary | ICD-10-CM | POA: Diagnosis not present

## 2022-08-02 DIAGNOSIS — Z1239 Encounter for other screening for malignant neoplasm of breast: Secondary | ICD-10-CM

## 2022-08-02 DIAGNOSIS — I890 Lymphedema, not elsewhere classified: Secondary | ICD-10-CM | POA: Insufficient documentation

## 2022-08-02 DIAGNOSIS — C50411 Malignant neoplasm of upper-outer quadrant of right female breast: Secondary | ICD-10-CM | POA: Insufficient documentation

## 2022-08-02 LAB — CMP (CANCER CENTER ONLY)
ALT: 44 U/L (ref 0–44)
AST: 47 U/L — ABNORMAL HIGH (ref 15–41)
Albumin: 3.9 g/dL (ref 3.5–5.0)
Alkaline Phosphatase: 55 U/L (ref 38–126)
Anion gap: 5 (ref 5–15)
BUN: 10 mg/dL (ref 6–20)
CO2: 30 mmol/L (ref 22–32)
Calcium: 9.1 mg/dL (ref 8.9–10.3)
Chloride: 105 mmol/L (ref 98–111)
Creatinine: 0.66 mg/dL (ref 0.44–1.00)
GFR, Estimated: >60 mL/min (ref >60)
Glucose, Bld: 86 mg/dL (ref 70–99)
Potassium: 3.7 mmol/L (ref 3.5–5.1)
Sodium: 140 mmol/L (ref 135–145)
Total Bilirubin: 0.2 mg/dL — ABNORMAL LOW (ref 0.3–1.2)
Total Protein: 6.3 g/dL — ABNORMAL LOW (ref 6.5–8.1)

## 2022-08-02 LAB — CBC WITH DIFFERENTIAL (CANCER CENTER ONLY)
Abs Immature Granulocytes: 0 10*3/uL (ref 0.00–0.07)
Basophils Absolute: 0.1 10*3/uL (ref 0.0–0.1)
Basophils Relative: 1 %
Eosinophils Absolute: 0.1 10*3/uL (ref 0.0–0.5)
Eosinophils Relative: 3 %
HCT: 38.9 % (ref 36.0–46.0)
Hemoglobin: 13.4 g/dL (ref 12.0–15.0)
Immature Granulocytes: 0 %
Lymphocytes Relative: 48 %
Lymphs Abs: 2.1 10*3/uL (ref 0.7–4.0)
MCH: 30.4 pg (ref 26.0–34.0)
MCHC: 34.4 g/dL (ref 30.0–36.0)
MCV: 88.2 fL (ref 80.0–100.0)
Monocytes Absolute: 0.3 10*3/uL (ref 0.1–1.0)
Monocytes Relative: 7 %
Neutro Abs: 1.8 10*3/uL (ref 1.7–7.7)
Neutrophils Relative %: 41 %
Platelet Count: 314 10*3/uL (ref 150–400)
RBC: 4.41 MIL/uL (ref 3.87–5.11)
RDW: 12.2 % (ref 11.5–15.5)
WBC Count: 4.3 10*3/uL (ref 4.0–10.5)
nRBC: 0 % (ref 0.0–0.2)

## 2022-08-02 MED ORDER — TAMOXIFEN CITRATE 10 MG PO TABS: 10.0000 mg | ORAL_TABLET | Freq: Every day | ORAL | 3 refills | Status: DC

## 2022-08-02 NOTE — Progress Notes (Signed)
[facility]  [phone] [phone]     ID: Jill [name] DOB: 01-15-1976  MR#: [id number]  VVO#:[id number]  Patient Care Team: Gildardo Pounds, NP as PCP - General (Nurse Practitioner) Jovita Kussmaul, MD as Consulting Physician (General Surgery) Dillingham, Loel Lofty, DO as Attending Physician (Plastic Surgery) Causey, Charlestine Massed, NP as Nurse Practitioner (Hematology and Oncology) Harmon Pier, RN as Registered Nurse Benay Pike, MD  CHIEF COMPLAINT: triple positive breast cancer (s/p right mastectomy)  CURRENT TREATMENT: Completing a year of trastuzumab, continue tamoxifen  INTERVAL HISTORY:  Jill [name] returns today for follow up of her triple positive breast cancer. She is accompanied by certified spanish interpretor today. She has been taking tamoxifen as prescribed. She denies any major complaints except for some pain and swelling of the right breast. 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 She has also noted some pain in the joints, fatigue, lower back, vaginal discharge and wonders if this is from tamoxifen. Rest of the pertinent 10 point ROS reviewed and negative.   COVID 19 VACCINATION STATUS: fully vaccinated [employer]), no booster as of September 2022  HISTORY OF CURRENT ILLNESS: From the original intake note:  "Jill [name]" herself palpated a right breast abnormality sometime in 2019.,  She did not pay much mind.  This was noted again during her pregnancy and she underwent bilateral diagnostic mammography with tomography and right breast ultrasonography at [facility] on 01/21/2020 showing: breast density category C; 4.1 cm irregular mass with associated group of pleomorphic calcifications measuring approximately 4.9 cm, located at site of palpable concern in upper-outer right breast at 9:30; no right axillary lymphadenopathy.  Accordingly on  02/04/2020 she proceeded to biopsy of the right breast area in question. The pathology from this procedure ([id number]) showed: invasive and in situ mammary carcinoma, e-cadherin positive, grade 2. Prognostic indicators significant for: estrogen receptor, 100% positive and progesterone receptor, 100% positive, both with strong staining intensity. Proliferation marker Ki67 at 20%. HER2 equivocal by immunohistochemistry (2+), but positive by fluorescent in situ hybridization with a signals ratio 5.39 and number per cell 11.05.  The patient's subsequent history is as detailed below.   PAST MEDICAL HISTORY: Past Medical History:  Diagnosis Date   Bilateral ovarian cysts 11/06/2021   Breast cancer ([place]) 03/07/2020   Infected cyst of Bartholin's gland duct    Thickened endometrium 11/06/2021    PAST SURGICAL HISTORY: Past Surgical History:  Procedure Laterality Date   IR IMAGING GUIDED PORT INSERTION  03/02/2020   IR REMOVAL TUN ACCESS W/ PORT W/O FL MOD SED  07/12/2021   MASTECTOMY W/ SENTINEL NODE BIOPSY Right 03/07/2020   Procedure: RIGHT MASTECTOMY WITH SENTINEL LYMPH NODE BIOPSY;  Surgeon: Jovita Kussmaul, MD;  Location: [place] [facility];  Service: General;  Laterality: Right;   WISDOM TOOTH EXTRACTION      FAMILY HISTORY: Family History  Problem Relation Age of Onset   Hypertension Mother    Diabetes Maternal Grandmother    Breast cancer Neg Hx   The patient's father is 38 and her mother 47 as of August 2021.  The patient is 3 brothers and 2 sisters.  There is no history of cancer in the family to her knowledge   GYNECOLOGIC HISTORY:  No LMP recorded. Menarche: 47 years old Age at first live birth: 47 years old [place] P 4 LMP irregular Contraceptive: Mirena IUD in place  HRT n/a  Hysterectomy? no BSO? no   SOCIAL HISTORY: (updated 01/2020)  Jill [name] works for a [employer] . Her significant other Jill [name] works Occupational hygienist.  They are both from [place].  The  patient's children are ages 51, 70, 46, and 50 months as of August 2021.  The older 3 children are from a different father and their last name is Carlis [name].  (The patient was not married to Mr. Carlis [name] so there is no legal issue regarding access to medical records, etc.).  The oldest daughter has a mild case of cerebral palsy.  The 28-monthold is a child of Jill [name] and Jill [name]  The patient attends a local [place]: Not in place   HEALTH MAINTENANCE: Social History   Tobacco Use   Smoking status: Never   Smokeless tobacco: Never  Vaping Use   Vaping Use: Never used  Substance Use Topics   Alcohol use: Never   Drug use: Never     Colonoscopy: n/a (age)  PAP: 10/2018, negative  Bone density: n/a (age)   No Known Allergies  Current Outpatient Medications  Medication Sig Dispense Refill   tamoxifen (NOLVADEX) 20 MG tablet Take 1 tablet (20 mg total) by mouth daily. 90 tablet 3   No current facility-administered medications for this visit.    OBJECTIVE: Spanish speaker who appears well  Vitals:   08/02/22 1143  BP: 111/66  Pulse: 87  Resp: 16  Temp: 97.9 F (36.6 C)  SpO2: 100%       Body mass index is 33.75 kg/m.   Wt Readings from Last 3 Encounters:  08/02/22 161 lb 8 oz (73.3 kg)  02/08/22 149 lb (67.6 kg)  02/08/22 150 lb 4.8 oz (68.2 kg)     ECOG FS:1 - Symptomatic but completely ambulatory  Physical Exam Constitutional:      Appearance: Normal appearance.  HENT:     Head: Normocephalic and atraumatic.  Chest:     Comments: Right breast status postmastectomy.  Left breast normal to inspection and palpation.  No regional adenopathy.   Musculoskeletal:     Cervical back: Normal range of motion and neck supple. No rigidity.  Lymphadenopathy:     Cervical: No cervical adenopathy.  Neurological:     Mental Status: She is alert.       LAB RESULTS:  CMP     Component Value Date/Time   NA 141 06/12/2021 1012   NA 138  12/15/2019 1543   K 4.0 06/12/2021 1012   CL 111 06/12/2021 1012   CO2 25 06/12/2021 1012   GLUCOSE 98 06/12/2021 1012   BUN 10 06/12/2021 1012   BUN 10 12/15/2019 1543   CREATININE 0.67 06/12/2021 1012   CALCIUM 8.4 (L) 06/12/2021 1012   PROT 6.3 (L) 06/12/2021 1012   PROT 7.3 12/15/2019 1543   ALBUMIN 3.7 06/12/2021 1012   ALBUMIN 4.5 12/15/2019 1543   AST 38 06/12/2021 1012   ALT 46 (H) 06/12/2021 1012   ALKPHOS 91 06/12/2021 1012   BILITOT 0.3 06/12/2021 1012   GFRNONAA >60 06/12/2021 1012   GFRAA >60 03/28/2020 0813   GFRAA >60 02/22/2020 1353    No results found for: "TOTALPROTELP", "ALBUMINELP", "A1GS", "A2GS", "BETS", "BETA2SER", "GAMS", "MSPIKE", "SPEI"  Lab Results  Component Value Date   WBC 3.3 (L) 06/12/2021   NEUTROABS 1.4 (L) 06/12/2021   HGB 13.1 06/12/2021   HCT 38.0 06/12/2021   MCV 90.5 06/12/2021   PLT 272 06/12/2021  No results found for: "LABCA2"  No components found for: "OFBPZW258"  No results for input(s): "INR" in the last 168 hours.  No results found for: "LABCA2"  No results found for: "CAN199"  Lab Results  Component Value Date   CAN125 9.1 11/06/2021    No results found for: "NID782"  No results found for: "CA2729"  No components found for: "HGQUANT"  No results found for: "CEA1", "CEA" / No results found for: "CEA1", "CEA"   No results found for: "AFPTUMOR"  No results found for: "CHROMOGRNA"  No results found for: "KPAFRELGTCHN", "LAMBDASER", "KAPLAMBRATIO" (kappa/lambda light chains)  Lab Results  Component Value Date   HGBA 97.0 10/10/2018   (Hemoglobinopathy evaluation)   No results found for: "LDH"  Lab Results  Component Value Date   IRON 42 08/29/2021   TIBC 375.2 08/29/2021   IRONPCTSAT 11.2 (L) 08/29/2021   (Iron and TIBC)  Lab Results  Component Value Date   FERRITIN 86.4 08/29/2021    Urinalysis    Component Value Date/Time   COLORURINE YELLOW 03/01/2021 1020   APPEARANCEUR CLEAR  03/01/2021 1020   LABSPEC 1.005 03/01/2021 1020   PHURINE 7.0 03/01/2021 1020   GLUCOSEU NEGATIVE 03/01/2021 1020   HGBUR SMALL (A) 03/01/2021 1020   BILIRUBINUR NEGATIVE 03/01/2021 1020   KETONESUR NEGATIVE 03/01/2021 1020   PROTEINUR NEGATIVE 03/01/2021 1020   NITRITE NEGATIVE 03/01/2021 [place] 03/01/2021 1020    STUDIES: No results found.   ELIGIBLE FOR AVAILABLE RESEARCH PROTOCOL: AET  ASSESSMENT:   47 y.o. [place] woman status post right breast upper outer quadrant biopsy 02/04/2020 for a clinical T2 N0, stage IB invasive ductal carcinoma, grade 2, triple positive, with an MIB-1 of 20%  (1) status post right mastectomy and sentinel lymph node sampling 03/07/2020 for a pT2 pN1, stage IB invasive ductal carcinoma, grade 2, with negative margins.  (a) a total of 4 lymph nodes were removed, one positive  (2) adjuvant chemo immunotherapy will consist of carboplatin, docetaxel, trastuzumab and pertuzumab every 21 days x 6 starting 03/29/2020, completed 07/18/2020  (a) pertuzumab discontinued after cycle 2 secondary to diarrhea  (3) trastuzumab continuing to complete a year (last dose 03/21/2021)  (a) echo 03/18/2020 shows an ejection fraction in the 60-65% range  (b) echo 06/29/2020 showed an EF in the 65-70% range  (c) echo 09/28/2020 showed an EF of 55-60%  (d) echo 01/02/2021 shows an ejection fraction in the 60-65% range  (e) echo 03/27/2021  (4) adjuvant radiation 08/17/2020 through 10/03/2020 Site Technique Total Dose (Gy) Dose per Fx (Gy) Completed Fx Beam Energies  Chest Wall, Right: CW_Rt 3D 50.4/50.4 1.8 28/28 6X, 10X  Chest Wall, Right: CW_Rt_SCLV 3D 50.4/50.4 1.8 28/28 6X, 10X  Chest Wall, Right: CW_Rt_Bst Electron 10/10 2 5/5 6E   (5) genetics testing 03/02/2020 through the [facility] Common Hereditary Cancers Panel. . The Common Hereditary Cancers Panel found no deleterious mutations in APC, ATM, AXIN2, BARD1, BMPR1A, BRCA1, BRCA2, BRIP1, CDH1,  CDK4, CDKN2A (p14ARF), CDKN2A (p16INK4a), CHEK2, CTNNA1, DICER1, EPCAM (Deletion/duplication testing only), GREM1 (promoter region deletion/duplication testing only), KIT, MEN1, MLH1, MSH2, MSH3, MSH6, MUTYH, NBN, NF1, NHTL1, PALB2, PDGFRA, PMS2, POLD1, POLE, PTEN, RAD50, RAD51C, RAD51D, RNF43, SDHB, SDHC, SDHD, SMAD4, SMARCA4. STK11, TP53, TSC1, TSC2, and VHL.  The following genes were evaluated for sequence changes only: SDHA and HOXB13 c.251G>A variant only.   (a)  a Variant of uncertain significance (VUS) in DICER1 at c.3380T>G (p.Ile1127Ser) was noted  (6) tamoxifen started 02/12/2020 in anticipation  of possible surgical delays, held during chemotherapy, resumed February 2022  (7) restaging studies:  (A) head CT with and without contrast 01/30/2021 showed no metastatic disease  (B) chest CT 01/30/2021 shows no evidence of metastatic disease.  There is hepatic steatosis, radiation changes right lung, scattered right upper lobe nodules all less than 0.5 cm  (C) MRI of the total spine 01/27/2021 shows no evidence of metastatic disease.  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   She however complains of severe fatigue, joint pains and vaginal discharge today and wonders if this is related to tamoxifen. We discussed that she can cut down the dose by half and take Tamoxifen 10 mg po daily. Mammogram ordered for left breast in Aug 2024 Recommend PT or exercises to help lymphedema of the breast, no concerns for recurrence today. Her occupation unfortunately doesn't help with the lymphedema. She had a few other questions about side effects of Tamoxifen. She also wanted some recommendations for an eye doc, recommended she call insurance and find covered providers.  Total encounter time 73mnutes.*  *Total Encounter Time as defined by the Centers for Medicare and Medicaid Services includes, in addition to the face-to-face time of a  patient visit (documented in the note above) non-face-to-face time: obtaining and reviewing outside history, ordering and reviewing medications, tests or procedures, care coordination (communications with other health care professionals or caregivers) and documentation in the medical record.  Patient

## 2022-08-03 ENCOUNTER — Ambulatory Visit: Payer: Self-pay | Admitting: Hematology and Oncology

## 2022-08-04 ENCOUNTER — Encounter: Payer: Self-pay | Admitting: Oncology

## 2022-10-31 ENCOUNTER — Other Ambulatory Visit: Payer: Self-pay | Admitting: Hematology and Oncology

## 2022-12-10 ENCOUNTER — Other Ambulatory Visit: Payer: Self-pay

## 2022-12-20 ENCOUNTER — Telehealth: Payer: Self-pay | Admitting: Hematology and Oncology

## 2022-12-20 NOTE — Telephone Encounter (Signed)
Patient is aware of upcoming appointment time/dates 

## 2023-01-02 ENCOUNTER — Other Ambulatory Visit: Payer: Self-pay | Admitting: Hematology and Oncology

## 2023-01-02 DIAGNOSIS — Z1239 Encounter for other screening for malignant neoplasm of breast: Secondary | ICD-10-CM

## 2023-01-02 DIAGNOSIS — Z170 Estrogen receptor positive status [ER+]: Secondary | ICD-10-CM

## 2023-01-31 ENCOUNTER — Ambulatory Visit: Payer: No Typology Code available for payment source | Admitting: Hematology and Oncology

## 2023-02-11 ENCOUNTER — Ambulatory Visit
Admission: RE | Admit: 2023-02-11 | Discharge: 2023-02-11 | Disposition: A | Discharge status: Home / Self Care | Payer: 59 | Source: Ambulatory Visit | Attending: Hematology and Oncology | Admitting: Hematology and Oncology

## 2023-02-11 DIAGNOSIS — Z1231 Encounter for screening mammogram for malignant neoplasm of breast: Secondary | ICD-10-CM | POA: Diagnosis not present

## 2023-02-11 DIAGNOSIS — Z1239 Encounter for other screening for malignant neoplasm of breast: Secondary | ICD-10-CM

## 2023-02-11 DIAGNOSIS — Z170 Estrogen receptor positive status [ER+]: Secondary | ICD-10-CM

## 2023-02-21 ENCOUNTER — Inpatient Hospital Stay: Payer: 59 | Attending: Hematology and Oncology | Admitting: Hematology and Oncology

## 2023-02-21 ENCOUNTER — Other Ambulatory Visit: Payer: Self-pay

## 2023-02-21 ENCOUNTER — Inpatient Hospital Stay: Payer: 59

## 2023-02-21 DIAGNOSIS — Z170 Estrogen receptor positive status [ER+]: Secondary | ICD-10-CM

## 2023-02-21 DIAGNOSIS — Z79810 Long term (current) use of selective estrogen receptor modulators (SERMs): Secondary | ICD-10-CM | POA: Diagnosis not present

## 2023-02-21 DIAGNOSIS — C50411 Malignant neoplasm of upper-outer quadrant of right female breast: Secondary | ICD-10-CM

## 2023-02-21 DIAGNOSIS — Z9221 Personal history of antineoplastic chemotherapy: Secondary | ICD-10-CM | POA: Diagnosis not present

## 2023-02-21 LAB — CBC WITH DIFFERENTIAL/PLATELET
Abs Immature Granulocytes: 0.01 10*3/uL (ref 0.00–0.07)
Basophils Absolute: 0.1 10*3/uL (ref 0.0–0.1)
Basophils Relative: 1 %
Eosinophils Absolute: 0.1 10*3/uL (ref 0.0–0.5)
Eosinophils Relative: 2 %
HCT: 39.4 % (ref 36.0–46.0)
Hemoglobin: 13.4 g/dL (ref 12.0–15.0)
Immature Granulocytes: 0 %
Lymphocytes Relative: 42 %
Lymphs Abs: 2.5 10*3/uL (ref 0.7–4.0)
MCH: 30 pg (ref 26.0–34.0)
MCHC: 34 g/dL (ref 30.0–36.0)
MCV: 88.1 fL (ref 80.0–100.0)
Monocytes Absolute: 0.4 10*3/uL (ref 0.1–1.0)
Monocytes Relative: 7 %
Neutro Abs: 3 10*3/uL (ref 1.7–7.7)
Neutrophils Relative %: 48 %
Platelets: 365 10*3/uL (ref 150–400)
RBC: 4.47 MIL/uL (ref 3.87–5.11)
RDW: 12.3 % (ref 11.5–15.5)
WBC: 6.1 10*3/uL (ref 4.0–10.5)
nRBC: 0 % (ref 0.0–0.2)

## 2023-02-21 LAB — CMP (CANCER CENTER ONLY)
ALT: 58 U/L — ABNORMAL HIGH (ref 0–44)
AST: 66 U/L — ABNORMAL HIGH (ref 15–41)
Albumin: 4.3 g/dL (ref 3.5–5.0)
Alkaline Phosphatase: 48 U/L (ref 38–126)
Anion gap: 5 (ref 5–15)
BUN: 13 mg/dL (ref 6–20)
CO2: 28 mmol/L (ref 22–32)
Calcium: 9.3 mg/dL (ref 8.9–10.3)
Chloride: 108 mmol/L (ref 98–111)
Creatinine: 0.7 mg/dL (ref 0.44–1.00)
GFR, Estimated: >60 mL/min (ref >60)
Glucose, Bld: 112 mg/dL — ABNORMAL HIGH (ref 70–99)
Potassium: 4.1 mmol/L (ref 3.5–5.1)
Sodium: 141 mmol/L (ref 135–145)
Total Bilirubin: 0.3 mg/dL (ref 0.3–1.2)
Total Protein: 7 g/dL (ref 6.5–8.1)

## 2023-02-21 MED ORDER — TAMOXIFEN CITRATE 10 MG PO TABS: 10.0000 mg | ORAL_TABLET | Freq: Every day | ORAL | 3 refills | Status: AC

## 2023-02-21 NOTE — Progress Notes (Signed)
[facility]  [phone] [phone]) [phone]     ID: Jill [name] DOB: 1976-04-15  MR#: [id number]  ZDG#:[id number]  Patient Care Team: Claiborne Rigg, NP as PCP - General (Nurse Practitioner) Griselda Miner, MD as Consulting Physician (General Surgery) Dillingham, Alena Bills, DO as Attending Physician (Plastic Surgery) Causey, Larna Daughters, NP as Nurse Practitioner (Hematology and Oncology) Maryclare Labrador, RN as Registered Nurse Rachel Moulds, MD  CHIEF COMPLAINT: triple positive breast cancer (s/p right mastectomy)  CURRENT TREATMENT: Completing a year of trastuzumab, continue tamoxifen  INTERVAL HISTORY:  [name] returns today for follow up of her triple positive breast cancer. She is accompanied by certified spanish interpretor today. She has been taking tamoxifen as prescribed. She reports some pain on both sides especially when she lifts heavy weights.  She has been running every day for an hour or an hour and a half for exercise.  She however has not been stretching after her run and has noticed increased muscle cramps in both her lower extremities.  From tamoxifen, she experiences intermittent leg cramps as well as vaginal discharge which is whitish in color.  No change in breathing, lower extremity swelling. Rest of the pertinent 10 point ROS reviewed and negative.   COVID 19 VACCINATION STATUS: fully vaccinated [employer]), no booster as of September 2022  HISTORY OF CURRENT ILLNESS: From the original intake note:  "Jill [name]" herself palpated a right breast abnormality sometime in 2019.,  She did not pay much mind.  This was noted again during her pregnancy and she underwent bilateral diagnostic mammography with tomography and right breast ultrasonography at [facility] on 01/21/2020 showing: breast density category C; 4.1 cm irregular mass with associated group of pleomorphic calcifications measuring approximately 4.9 cm,  located at site of palpable concern in upper-outer right breast at 9:30; no right axillary lymphadenopathy.  Accordingly on 02/04/2020 she proceeded to biopsy of the right breast area in question. The pathology from this procedure ([id number]) showed: invasive and in situ mammary carcinoma, e-cadherin positive, grade 2. Prognostic indicators significant for: estrogen receptor, 100% positive and progesterone receptor, 100% positive, both with strong staining intensity. Proliferation marker Ki67 at 20%. HER2 equivocal by immunohistochemistry (2+), but positive by fluorescent in situ hybridization with a signals ratio 5.39 and number per cell 11.05.  The patient's subsequent history is as detailed below.   PAST MEDICAL HISTORY: Past Medical History:  Diagnosis Date   Bilateral ovarian cysts 11/06/2021   Breast cancer (HCC) 03/07/2020   Infected cyst of Bartholin's gland duct    Thickened endometrium 11/06/2021    PAST SURGICAL HISTORY: Past Surgical History:  Procedure Laterality Date   IR IMAGING GUIDED PORT INSERTION  03/02/2020   IR REMOVAL TUN ACCESS W/ PORT W/O FL MOD SED  07/12/2021   MASTECTOMY W/ SENTINEL NODE BIOPSY Right 03/07/2020   Procedure: RIGHT MASTECTOMY WITH SENTINEL LYMPH NODE BIOPSY;  Surgeon: Griselda Miner, MD;  Location: [facility];  Service: General;  Laterality: Right;   WISDOM TOOTH EXTRACTION      FAMILY HISTORY: Family History  Problem Relation Age of Onset   Hypertension Mother    Diabetes Maternal Grandmother    Breast cancer Neg Hx   The patient's father is 48 and her mother 35 as of August 2021.  The patient is 3 brothers and 2 sisters.  There is no history of cancer in the family to her knowledge   GYNECOLOGIC HISTORY:  No LMP recorded.  Menarche: 47 years old Age at first live birth: 47 years old GX P 4 LMP irregular Contraceptive: Mirena IUD in place HRT n/a  Hysterectomy? no BSO? 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 Her significant other [name] works Radio broadcast assistant.  [place]  The patient's children are ages 16, 52, 49, and 58 months as of August 2021.  The older 3 children are from a different father and their last name is Jill [name].  (The patient was not married to Mr. Jill [name] so there is no legal issue regarding access to medical records, etc.).  The oldest daughter has a mild case of cerebral palsy.  The 29-month-old is a child of [place] and [place].  The patient attends a Therapist, occupational church   ADVANCED DIRECTIVES: Not in place   HEALTH MAINTENANCE: Social History   Tobacco Use   Smoking status: Never   Smokeless tobacco: Never  Vaping Use   Vaping status: Never Used  Substance Use Topics   Alcohol use: Never   Drug use: Never     Colonoscopy: n/a (age)  PAP: 10/2018, negative  Bone density: n/a (age)   No Known Allergies  Current Outpatient Medications  Medication Sig Dispense Refill   tamoxifen (NOLVADEX) 10 MG tablet Take 1 tablet (10 mg total) by mouth daily. 90 tablet 3   No current facility-administered medications for this visit.    OBJECTIVE: Spanish speaker who appears well  Vitals:   02/21/23 1253  BP: 110/60  Pulse: 85  Resp: 16  Temp: 98.2 F (36.8 C)  SpO2: 100%       Body mass index is 34.53 kg/m.   Wt Readings from Last 3 Encounters:  02/21/23 165 lb 3.2 oz (74.9 kg)  08/02/22 161 lb 8 oz (73.3 kg)  02/08/22 149 lb (67.6 kg)     ECOG FS:1 - Symptomatic but completely ambulatory  Physical Exam Constitutional:      Appearance: Normal appearance.  HENT:     Head: Normocephalic and atraumatic.  Chest:     Comments: Right breast status postmastectomy.  Left breast normal to inspection and palpation.  No regional adenopathy.   Musculoskeletal:     Cervical back: Normal range of motion and neck supple. No rigidity.  Lymphadenopathy:     Cervical: No cervical adenopathy.  Neurological:     Mental  Status: She is alert.       LAB RESULTS:  CMP     Component Value Date/Time   NA 140 08/02/2022 1237   NA 138 12/15/2019 1543   K 3.7 08/02/2022 1237   CL 105 08/02/2022 1237   CO2 30 08/02/2022 1237   GLUCOSE 86 08/02/2022 1237   BUN 10 08/02/2022 1237   BUN 10 12/15/2019 1543   CREATININE 0.66 08/02/2022 1237   CALCIUM 9.1 08/02/2022 1237   PROT 6.3 (L) 08/02/2022 1237   PROT 7.3 12/15/2019 1543   ALBUMIN 3.9 08/02/2022 1237   ALBUMIN 4.5 12/15/2019 1543   AST 47 (H) 08/02/2022 1237   ALT 44 08/02/2022 1237   ALKPHOS 55 08/02/2022 1237   BILITOT 0.2 (L) 08/02/2022 1237   GFRNONAA >60 08/02/2022 1237   GFRAA >60 03/28/2020 0813   GFRAA >60 02/22/2020 1353    No results found for: "TOTALPROTELP", "ALBUMINELP", "A1GS", "A2GS", "BETS", "BETA2SER", "GAMS", "MSPIKE", "SPEI"  Lab Results  Component Value Date   WBC 4.3 08/02/2022   NEUTROABS 1.8 08/02/2022  HGB 13.4 08/02/2022   HCT 38.9 08/02/2022   MCV 88.2 08/02/2022   PLT 314 08/02/2022    No results found for: "LABCA2"  No components found for: "NWGNFA213"  No results for input(s): "INR" in the last 168 hours.  No results found for: "LABCA2"  No results found for: "CAN199"  Lab Results  Component Value Date   CAN125 9.1 11/06/2021    No results found for: "YQM578"  No results found for: "CA2729"  No components found for: "HGQUANT"  No results found for: "CEA1", "CEA" / No results found for: "CEA1", "CEA"   No results found for: "AFPTUMOR"  No results found for: "CHROMOGRNA"  No results found for: "KPAFRELGTCHN", "LAMBDASER", "KAPLAMBRATIO" (kappa/lambda light chains)  Lab Results  Component Value Date   HGBA 97.0 10/10/2018   (Hemoglobinopathy evaluation)   No results found for: "LDH"  Lab Results  Component Value Date   IRON 42 08/29/2021   TIBC 375.2 08/29/2021   IRONPCTSAT 11.2 (L) 08/29/2021   (Iron and TIBC)  Lab Results  Component Value Date   FERRITIN 86.4  08/29/2021    Urinalysis    Component Value Date/Time   COLORURINE YELLOW 03/01/2021 1020   APPEARANCEUR CLEAR 03/01/2021 1020   LABSPEC 1.005 03/01/2021 1020   PHURINE 7.0 03/01/2021 1020   GLUCOSEU NEGATIVE 03/01/2021 1020   HGBUR SMALL (A) 03/01/2021 1020   BILIRUBINUR NEGATIVE 03/01/2021 1020   KETONESUR NEGATIVE 03/01/2021 1020   PROTEINUR NEGATIVE 03/01/2021 1020   NITRITE NEGATIVE 03/01/2021 1020   LEUKOCYTESUR NEGATIVE 03/01/2021 1020    STUDIES: MM 3D SCREENING MAMMOGRAM UNILATERAL LEFT BREAST  Result Date: 02/12/2023 CLINICAL DATA:  Screening. EXAM: DIGITAL SCREENING UNILATERAL LEFT MAMMOGRAM WITH CAD AND TOMOSYNTHESIS TECHNIQUE: Left screening digital craniocaudal and mediolateral oblique mammograms were obtained. Left screening digital breast tomosynthesis was performed. The images were evaluated with computer-aided detection. COMPARISON:  Previous exam(s). ACR Breast Density Category b: There are scattered areas of fibroglandular density. FINDINGS: There are no findings suspicious for malignancy. IMPRESSION: No mammographic evidence of malignancy. A result letter of this screening mammogram will be mailed directly to the patient. RECOMMENDATION: Screening mammogram in one year. (Code:SM-B-01Y) BI-RADS CATEGORY  1: Negative. Electronically Signed   By: Elberta Fortis M.D.   On: 02/12/2023 16:26     ELIGIBLE FOR AVAILABLE RESEARCH PROTOCOL: AET  ASSESSMENT:   47 y.o. [place] woman status post right breast upper outer quadrant biopsy 02/04/2020 for a clinical T2 N0, stage IB invasive ductal carcinoma, grade 2, triple positive, with an MIB-1 of 20%  (1) status post right mastectomy and sentinel lymph node sampling 03/07/2020 for a pT2 pN1, stage IB invasive ductal carcinoma, grade 2, with negative margins.  (a) a total of 4 lymph nodes were removed, one positive  (2) adjuvant chemo immunotherapy will consist of carboplatin, docetaxel, trastuzumab and pertuzumab every 21  days x 6 starting 03/29/2020, completed 07/18/2020  (a) pertuzumab discontinued after cycle 2 secondary to diarrhea  (3) trastuzumab continuing to complete a year (last dose 03/21/2021)  (a) echo 03/18/2020 shows an ejection fraction in the 60-65% range  (b) echo 06/29/2020 showed an EF in the 65-70% range  (c) echo 09/28/2020 showed an EF of 55-60%  (d) echo 01/02/2021 shows an ejection fraction in the 60-65% range  (e) echo 03/27/2021  (4) adjuvant radiation 08/17/2020 through 10/03/2020 Site Technique Total Dose (Gy) Dose per Fx (Gy) Completed Fx Beam Energies  Chest Wall, Right: CW_Rt 3D 50.4/50.4 1.8 28/28 6X, 10X  Chest Wall,  Right: CW_Rt_SCLV 3D 50.4/50.4 1.8 28/28 6X, 10X  Chest Wall, Right: CW_Rt_Bst Electron 10/10 2 5/5 6E   (5) genetics testing 03/02/2020 through the [facility] Common Hereditary Cancers Panel. . The Common Hereditary Cancers Panel found no deleterious mutations in APC, ATM, AXIN2, BARD1, BMPR1A, BRCA1, BRCA2, BRIP1, CDH1, CDK4, CDKN2A (p14ARF), CDKN2A (p16INK4a), CHEK2, CTNNA1, DICER1, EPCAM (Deletion/duplication testing only), GREM1 (promoter region deletion/duplication testing only), KIT, MEN1, MLH1, MSH2, MSH3, MSH6, MUTYH, NBN, NF1, NHTL1, PALB2, PDGFRA, PMS2, POLD1, POLE, PTEN, RAD50, RAD51C, RAD51D, RNF43, SDHB, SDHC, SDHD, SMAD4, SMARCA4. STK11, TP53, TSC1, TSC2, and VHL.  The following genes were evaluated for sequence changes only: SDHA and HOXB13 c.251G>A variant only.   (a)  a Variant of uncertain significance (VUS) in DICER1 at c.3380T>G (p.Ile1127Ser) was noted  (6) tamoxifen started 02/12/2020 in anticipation of possible surgical delays, held during chemotherapy, resumed February 2022  (7) restaging studies:  (A) head CT with and without contrast 01/30/2021 showed no metastatic disease  (B) chest CT 01/30/2021 shows no evidence of metastatic disease.  There is hepatic steatosis, radiation changes right lung, scattered right upper lobe nodules all less than  0.5 cm  (C) MRI of the total spine 01/27/2021 shows no evidence of metastatic disease.  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 She is now on tamoxifen 10 mg daily.  She is tolerating this dose better. Most recent mammogram neg for malignancy On PE, no concerns for recurrence,  With regards to weight gain, she is working out regularly, runs thrice a week, recommended adding weights for bone strengthening in addition to cardio. She will other continue SBE and keep [place] posted with any new concerns. Prescription refilled.  Total encounter time .*  *Total Encounter Time as defined by the Centers for Medicare and Medicaid Services includes, in addition to the face-to-face time of a patient visit (documented in the note above) non-face-to-face time: obtaining and reviewing outside history, ordering and reviewing medications, tests or procedures, care coordination (communications with other health care professionals or caregivers) and documentation in the medical record.  Patient

## 2023-04-12 NOTE — Telephone Encounter (Signed)
TC

## 2024-01-13 ENCOUNTER — Encounter: Payer: Self-pay | Admitting: Hematology and Oncology

## 2024-01-13 DIAGNOSIS — Z0000 Encounter for general adult medical examination without abnormal findings: Secondary | ICD-10-CM

## 2024-02-21 ENCOUNTER — Inpatient Hospital Stay: Payer: 59 | Attending: Hematology and Oncology | Admitting: Hematology and Oncology

## 2024-02-21 ENCOUNTER — Inpatient Hospital Stay

## 2024-02-21 VITALS — BP 106/79 | HR 67 | Temp 98.5°F | Resp 18 | Wt 172.0 lb

## 2024-02-21 DIAGNOSIS — Z9011 Acquired absence of right breast and nipple: Secondary | ICD-10-CM | POA: Diagnosis not present

## 2024-02-21 DIAGNOSIS — M533 Sacrococcygeal disorders, not elsewhere classified: Secondary | ICD-10-CM | POA: Insufficient documentation

## 2024-02-21 DIAGNOSIS — Z170 Estrogen receptor positive status [ER+]: Secondary | ICD-10-CM | POA: Insufficient documentation

## 2024-02-21 DIAGNOSIS — C50411 Malignant neoplasm of upper-outer quadrant of right female breast: Secondary | ICD-10-CM | POA: Insufficient documentation

## 2024-02-21 DIAGNOSIS — Z1731 Human epidermal growth factor receptor 2 positive status: Secondary | ICD-10-CM | POA: Insufficient documentation

## 2024-02-21 DIAGNOSIS — Z79810 Long term (current) use of selective estrogen receptor modulators (SERMs): Secondary | ICD-10-CM | POA: Diagnosis not present

## 2024-02-21 DIAGNOSIS — Z1721 Progesterone receptor positive status: Secondary | ICD-10-CM | POA: Insufficient documentation

## 2024-02-21 LAB — CBC WITH DIFFERENTIAL/PLATELET
Abs Immature Granulocytes: 0.01 K/uL (ref 0.00–0.07)
Basophils Absolute: 0.1 K/uL (ref 0.0–0.1)
Basophils Relative: 1 %
Eosinophils Absolute: 0.1 K/uL (ref 0.0–0.5)
Eosinophils Relative: 3 %
HCT: 42.7 % (ref 36.0–46.0)
Hemoglobin: 14.9 g/dL (ref 12.0–15.0)
Immature Granulocytes: 0 %
Lymphocytes Relative: 43 %
Lymphs Abs: 2.2 K/uL (ref 0.7–4.0)
MCH: 30.4 pg (ref 26.0–34.0)
MCHC: 34.9 g/dL (ref 30.0–36.0)
MCV: 87.1 fL (ref 80.0–100.0)
Monocytes Absolute: 0.4 K/uL (ref 0.1–1.0)
Monocytes Relative: 7 %
Neutro Abs: 2.3 K/uL (ref 1.7–7.7)
Neutrophils Relative %: 46 %
Platelets: 378 K/uL (ref 150–400)
RBC: 4.9 MIL/uL (ref 3.87–5.11)
RDW: 12.1 % (ref 11.5–15.5)
WBC: 5.2 K/uL (ref 4.0–10.5)
nRBC: 0 % (ref 0.0–0.2)

## 2024-02-21 LAB — CMP (CANCER CENTER ONLY)
ALT: 39 U/L (ref 0–44)
AST: 46 U/L — ABNORMAL HIGH (ref 15–41)
Albumin: 4.3 g/dL (ref 3.5–5.0)
Alkaline Phosphatase: 70 U/L (ref 38–126)
Anion gap: 4 — ABNORMAL LOW (ref 5–15)
BUN: 8 mg/dL (ref 6–20)
CO2: 30 mmol/L (ref 22–32)
Calcium: 9.3 mg/dL (ref 8.9–10.3)
Chloride: 104 mmol/L (ref 98–111)
Creatinine: 0.63 mg/dL (ref 0.44–1.00)
GFR, Estimated: >60 mL/min (ref >60)
Glucose, Bld: 93 mg/dL (ref 70–99)
Potassium: 4 mmol/L (ref 3.5–5.1)
Sodium: 138 mmol/L (ref 135–145)
Total Bilirubin: 0.3 mg/dL (ref 0.0–1.2)
Total Protein: 7.3 g/dL (ref 6.5–8.1)

## 2024-02-21 NOTE — Progress Notes (Signed)
 [facility]  Telephone:([phone]) [phone] Fax:(336) [phone]     ID: Jill [name] DOB: [birth year]  MR#: [id number]  RDW#:[id number]  Patient Care Team: Jill [name] ORN, NP as PCP - General (Nurse Practitioner) Jill Deward MOULD, MD as Consulting Physician (General Surgery) Dillingham, Estefana RAMAN, DO as Attending Physician (Plastic Surgery) Causey, Jill Pickle, NP as Nurse Practitioner (Hematology and Oncology) Jill Wendelyn BIRCH, RN as Registered Nurse Jill Stalls, MD  CHIEF COMPLAINT: triple positive breast cancer (s/p right mastectomy)  CURRENT TREATMENT: Completing a year of trastuzumab , continue tamoxifen   INTERVAL HISTORY:  Jill [name] returns today for follow up of her triple positive breast cancer. She is accompanied by certified spanish interpretor today. She has been taking tamoxifen  as prescribed. She reports some pain on both sides especially when she lifts heavy weights.  She has been running every day for an hour or an hour and a half for exercise.  She however has not been stretching after her run and has noticed increased muscle cramps in both her lower extremities.  From tamoxifen , she experiences intermittent leg cramps as well as vaginal discharge which is whitish in color.  No change in breathing, lower extremity swelling. Rest of the pertinent 10 point ROS reviewed and negative.   COVID 19 VACCINATION STATUS: fully vaccinated [employer]), no booster as of September 2022  HISTORY OF CURRENT ILLNESS: From the original intake note:  Jill [name] herself palpated a right breast abnormality sometime in 2019.,  She did not pay much mind.  This was noted again during her pregnancy and she underwent bilateral diagnostic mammography with tomography and right breast ultrasonography at [facility] on 01/21/2020 showing: breast density category C; 4.1 cm irregular mass with associated group of pleomorphic calcifications measuring approximately 4.9 cm,  located at site of palpable concern in upper-outer right breast at 9:30; no right axillary lymphadenopathy.  Accordingly on 02/04/2020 she proceeded to biopsy of the right breast area in question. The pathology from this procedure ([id number]) showed: invasive and in situ mammary carcinoma, e-cadherin positive, grade 2. Prognostic indicators significant for: estrogen receptor, 100% positive and progesterone receptor, 100% positive, both with strong staining intensity. Proliferation marker Ki67 at 20%. HER2 equivocal by immunohistochemistry (2+), but positive by fluorescent in situ hybridization with a signals ratio 5.39 and number per cell 11.05.  The patient's subsequent history is as detailed below.   PAST MEDICAL HISTORY: Past Medical History:  Diagnosis Date   Bilateral ovarian cysts 11/06/2021   Breast cancer (HCC) 03/07/2020   Infected cyst of Bartholin's gland duct    Thickened endometrium 11/06/2021    PAST SURGICAL HISTORY: Past Surgical History:  Procedure Laterality Date   IR IMAGING GUIDED PORT INSERTION  03/02/2020   IR REMOVAL TUN ACCESS W/ PORT W/O FL MOD SED  07/12/2021   MASTECTOMY W/ SENTINEL NODE BIOPSY Right 03/07/2020   Procedure: RIGHT MASTECTOMY WITH SENTINEL LYMPH NODE BIOPSY;  Surgeon: Jill Deward MOULD, MD;  Location: [facility];  Service: General;  Laterality: Right;   WISDOM TOOTH EXTRACTION      FAMILY HISTORY: Family History  Problem Relation Age of Onset   Hypertension Mother    Diabetes Maternal Grandmother    Breast cancer Neg Hx   The patient's father is 75 and her mother 42 as of August 2021.  The patient is 3 brothers and 2 sisters.  There is no history of cancer in the family to her knowledge   GYNECOLOGIC HISTORY:  No LMP recorded.  Menarche: 48 years old Age at first live birth: 48 years old GX P 4 LMP irregular Contraceptive: Mirena IUD in place HRT n/a  Hysterectomy? no BSO? 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 Her significant other Jill [name] works Radio broadcast assistant.  [place]  The patient's children are ages 46, 46, 67, and 79 months as of August 2021.  The older 3 children are from a different father and their last name is Jill [name].  (The patient was not married to Mr. Jill [name] so there is no legal issue regarding access to medical records, etc.).  The oldest daughter has a mild case of cerebral palsy.  The 35-month-old is a child of Jill [name] and Jill [name].  The patient attends a Therapist, occupational church   ADVANCED DIRECTIVES: Not in place   HEALTH MAINTENANCE: Social History   Tobacco Use   Smoking status: Never   Smokeless tobacco: Never  Vaping Use   Vaping status: Never Used  Substance Use Topics   Alcohol use: Never   Drug use: Never     Colonoscopy: n/a (age)  PAP: 10/2018, negative  Bone density: n/a (age)   No Known Allergies  Current Outpatient Medications  Medication Sig Dispense Refill   tamoxifen  (NOLVADEX ) 10 MG tablet Take 1 tablet (10 mg total) by mouth daily. 90 tablet 3   No current facility-administered medications for this visit.    OBJECTIVE: Spanish speaker who appears well  Vitals:   02/21/24 0929  BP: 106/79  Pulse: 67  Resp: 18  Temp: 98.5 F (36.9 C)  SpO2: 100%       Body mass index is 35.95 kg/m.   Wt Readings from Last 3 Encounters:  02/21/24 172 lb (78 kg)  02/21/23 165 lb 3.2 oz (74.9 kg)  08/02/22 161 lb 8 oz (73.3 kg)     ECOG FS:1 - Symptomatic but completely ambulatory  Physical Exam Constitutional:      Appearance: Normal appearance.  HENT:     Head: Normocephalic and atraumatic.  Chest:     Comments: Right breast status postmastectomy.  Left breast normal to inspection and palpation.  No regional adenopathy.   Musculoskeletal:     Cervical back: Normal range of motion and neck supple. No rigidity.  Lymphadenopathy:     Cervical: No cervical adenopathy.  Neurological:     Mental  Status: She is alert.       LAB RESULTS:  CMP     Component Value Date/Time   NA 141 02/21/2023 1331   NA 138 12/15/2019 1543   K 4.1 02/21/2023 1331   CL 108 02/21/2023 1331   CO2 28 02/21/2023 1331   GLUCOSE 112 (H) 02/21/2023 1331   BUN 13 02/21/2023 1331   BUN 10 12/15/2019 1543   CREATININE 0.70 02/21/2023 1331   CALCIUM  9.3 02/21/2023 1331   PROT 7.0 02/21/2023 1331   PROT 7.3 12/15/2019 1543   ALBUMIN 4.3 02/21/2023 1331   ALBUMIN 4.5 12/15/2019 1543   AST 66 (H) 02/21/2023 1331   ALT 58 (H) 02/21/2023 1331   ALKPHOS 48 02/21/2023 1331   BILITOT 0.3 02/21/2023 1331   GFRNONAA >60 02/21/2023 1331   GFRAA >60 03/28/2020 0813   GFRAA >60 02/22/2020 1353    No results found for: TOTALPROTELP, ALBUMINELP, A1GS, A2GS, BETS, BETA2SER, GAMS, MSPIKE, SPEI  Lab Results  Component Value Date   WBC 6.1 02/21/2023   NEUTROABS 3.0 02/21/2023  HGB 13.4 02/21/2023   HCT 39.4 02/21/2023   MCV 88.1 02/21/2023   PLT 365 02/21/2023    No results found for: LABCA2  No components found for: [id number]  No results for input(s): INR in the last 168 hours.  No results found for: LABCA2  No results found for: CAN199  Lab Results  Component Value Date   CAN125 9.1 11/06/2021    No results found for: CAN153  No results found for: CA2729  No components found for: HGQUANT  No results found for: CEA1, CEA / No results found for: CEA1, CEA   No results found for: AFPTUMOR  No results found for: CHROMOGRNA  No results found for: KPAFRELGTCHN, LAMBDASER, KAPLAMBRATIO (kappa/lambda light chains)  Lab Results  Component Value Date   HGBA 97.0 10/10/2018   (Hemoglobinopathy evaluation)   No results found for: LDH  Lab Results  Component Value Date   IRON 42 08/29/2021   TIBC 375.2 08/29/2021   IRONPCTSAT 11.2 (L) 08/29/2021   (Iron and TIBC)  Lab Results  Component Value Date   FERRITIN 86.4  08/29/2021    Urinalysis    Component Value Date/Time   COLORURINE YELLOW 03/01/2021 1020   APPEARANCEUR CLEAR 03/01/2021 1020   LABSPEC 1.005 03/01/2021 1020   PHURINE 7.0 03/01/2021 1020   GLUCOSEU NEGATIVE 03/01/2021 1020   HGBUR SMALL (A) 03/01/2021 1020   BILIRUBINUR NEGATIVE 03/01/2021 1020   KETONESUR NEGATIVE 03/01/2021 1020   PROTEINUR NEGATIVE 03/01/2021 1020   NITRITE NEGATIVE 03/01/2021 1020   LEUKOCYTESUR NEGATIVE 03/01/2021 1020    STUDIES: No results found.   ELIGIBLE FOR AVAILABLE RESEARCH PROTOCOL: AET  ASSESSMENT:   48 y.o. [place] woman status post right breast upper outer quadrant biopsy 02/04/2020 for a clinical T2 N0, stage IB invasive ductal carcinoma, grade 2, triple positive, with an MIB-1 of 20%  (1) status post right mastectomy and sentinel lymph node sampling 03/07/2020 for a pT2 pN1, stage IB invasive ductal carcinoma, grade 2, with negative margins.  (a) a total of 4 lymph nodes were removed, one positive  (2) adjuvant chemo immunotherapy will consist of carboplatin , docetaxel , trastuzumab  and pertuzumab  every 21 days x 6 starting 03/29/2020, completed 07/18/2020  (a) pertuzumab  discontinued after cycle 2 secondary to diarrhea  (3) trastuzumab  continuing to complete a year (last dose 03/21/2021)  (a) echo 03/18/2020 shows an ejection fraction in the 60-65% range  (b) echo 06/29/2020 showed an EF in the 65-70% range  (c) echo 09/28/2020 showed an EF of 55-60%  (d) echo 01/02/2021 shows an ejection fraction in the 60-65% range  (e) echo 03/27/2021  (4) adjuvant radiation 08/17/2020 through 10/03/2020 Site Technique Total Dose (Gy) Dose per Fx (Gy) Completed Fx Beam Energies  Chest Wall, Right: CW_Rt 3D 50.4/50.4 1.8 28/28 6X, 10X  Chest Wall, Right: CW_Rt_SCLV 3D 50.4/50.4 1.8 28/28 6X, 10X  Chest Wall, Right: CW_Rt_Bst Electron 10/10 2 5/5 6E   (5) genetics testing 03/02/2020 through the [facility] Common Hereditary Cancers Panel. . The Common  Hereditary Cancers Panel found no deleterious mutations in APC, ATM, AXIN2, BARD1, BMPR1A, BRCA1, BRCA2, BRIP1, CDH1, CDK4, CDKN2A (p14ARF), CDKN2A (p16INK4a), CHEK2, CTNNA1, DICER1, EPCAM (Deletion/duplication testing only), GREM1 (promoter region deletion/duplication testing only), KIT, MEN1, MLH1, MSH2, MSH3, MSH6, MUTYH, NBN, NF1, NHTL1, PALB2, PDGFRA, PMS2, POLD1, POLE, PTEN, RAD50, RAD51C, RAD51D, RNF43, SDHB, SDHC, SDHD, SMAD4, SMARCA4. STK11, TP53, TSC1, TSC2, and VHL.  The following genes were evaluated for sequence changes only: SDHA and HOXB13 c.251G>A variant only.   (a)  a Variant of uncertain significance (VUS) in DICER1 at c.3380T>G (p.Ile1127Ser) was noted  (6) tamoxifen  started 02/12/2020 in anticipation of possible surgical delays, held during chemotherapy, resumed February 2022  (7) restaging studies:  (A) head CT with and without contrast 01/30/2021 showed no metastatic disease  (B) chest CT 01/30/2021 shows no evidence of metastatic disease.  There is hepatic steatosis, radiation changes right lung, scattered right upper lobe nodules all less than 0.5 cm  (C) MRI of the total spine 01/27/2021 shows no evidence of metastatic disease.  There is L5 pars defect and mild degenerative disc disease   PLAN:  Patient continues on adjuvant tamoxifen . She is now on tamoxifen  10 mg daily.  She is tolerating this dose better. Most recent mammogram neg for malignancy On PE, no concerns for recurrence,  With regards to weight gain, she is working out regularly, runs thrice a week, recommended adding weights for bone strengthening in addition to cardio. She will other continue SBE and keep us  posted with any new concerns. Prescription refilled.  Total encounter time .*  *Total Encounter Time as defined by the Centers for Medicare and Medicaid Services includes, in addition to the face-to-face time of a patient visit (documented in the note above) non-face-to-face time: obtaining  and reviewing outside history, ordering and reviewing medications, tests or procedures, care coordination (communications with other health care professionals or caregivers) and documentation in the medical record.  Patient

## 2024-02-21 NOTE — Progress Notes (Signed)
 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  Discussed the use of AI scribe software for clinical note transcription with the patient, who gave verbal consent to proceed.  History of Present Illness Jill [name] is a 48 year old female with breast cancer who presents for follow-up regarding tamoxifen  use and breast health.  She stopped taking tamoxifen  three months ago due to significant vaginal discharge. Previously, she was on a 10 mg dose and is considering resuming at a lower dose of 5 mg. She is premenopausal and currently menstruating.  She experiences occasional discomfort in the breast area, particularly in the armpit, which she attributes to changes in movement mechanics after losing a breast. She has not had a mammogram since August 19th of the previous year due to insurance coverage issues but is seeking to have one done soon. No significant weight loss or gain.  She reports pain and inflammation in the coccyx area, present since her last pregnancy, making it difficult to sit for extended periods. She uses a donut cushion to alleviate pressure on the coccyx. Her mother experienced similar coccyx pain.  She maintains a regular exercise routine, although not as rigorous as  before, and feels she is maintaining her weight. She tries to eat healthily most of the time and sleeps six to eight hours per night.    Rest of the pertinent 10 point ROS reviewed and negative.   COVID 19 VACCINATION STATUS: fully vaccinated [employer]), no booster as of September 2022  HISTORY OF CURRENT ILLNESS: From the original intake note:  Jill [name] herself palpated a right breast abnormality sometime in 2019.,  She did not pay much mind.  This was noted again during her pregnancy and she underwent bilateral diagnostic mammography with tomography and right breast ultrasonography at [facility] on 01/21/2020 showing: breast density category C; 4.1 cm irregular mass with associated group of pleomorphic calcifications measuring approximately 4.9 cm, located at site of palpable concern in upper-outer right breast at 9:30; no right axillary lymphadenopathy.  Accordingly on 02/04/2020 she proceeded to biopsy of the right breast area in question. The pathology from this procedure ([id number]) showed: invasive and in situ mammary carcinoma, e-cadherin positive, grade 2. Prognostic indicators significant for: estrogen receptor, 100% positive and progesterone receptor, 100% positive, both with strong staining intensity. Proliferation marker Ki67 at 20%. HER2 equivocal by immunohistochemistry (2+), but positive by fluorescent in situ hybridization with a signals ratio 5.39 and number per cell 11.05.  The patient's subsequent history is as detailed below.   PAST MEDICAL HISTORY: Past Medical History:  Diagnosis Date   Bilateral ovarian cysts 11/06/2021   Breast cancer (HCC) 03/07/2020   Infected cyst of Bartholin's gland duct    Thickened endometrium 11/06/2021    PAST SURGICAL HISTORY: Past Surgical History:  Procedure Laterality Date   IR IMAGING GUIDED PORT INSERTION  03/02/2020  IR REMOVAL TUN ACCESS W/ PORT W/O FL MOD SED  07/12/2021   MASTECTOMY W/ SENTINEL NODE BIOPSY Right  03/07/2020   Procedure: RIGHT MASTECTOMY WITH SENTINEL LYMPH NODE BIOPSY;  Surgeon: Curvin Deward MOULD, MD;  Location: [facility];  Service: General;  Laterality: Right;   WISDOM TOOTH EXTRACTION      FAMILY HISTORY: Family History  Problem Relation Age of Onset   Hypertension Mother    Diabetes Maternal Grandmother    Breast cancer Neg Hx   The patient's father is 50 and her mother 67 as of August 2021.  The patient is 3 brothers and 2 sisters.  There is no history of cancer in the family to her knowledge   GYNECOLOGIC HISTORY:  No LMP recorded. Menarche: 49 years old Age at first live birth: 48 years old GX P 4 LMP irregular Contraceptive: Mirena IUD in place HRT n/a  Hysterectomy? no BSO? no   SOCIAL HISTORY: (updated 01/2020)  Barbar works for a Pensions consultant . Her significant other [name] works Radio broadcast assistant.  [place] are [place]  The patient's children are ages 25, 2, 41, and 34 months as of August 2021.  The older 3 children are from a different father and their last name is Laquetta.  (The patient was not married to Mr. Laquetta so there is no legal issue regarding access to medical records, etc.).  The oldest daughter has a mild case of cerebral palsy.  The 68-month-old is a child of Jill [name] and [place].  The patient attends a Therapist, occupational church   ADVANCED DIRECTIVES: Not in place   HEALTH MAINTENANCE: Social History   Tobacco Use   Smoking status: Never   Smokeless tobacco: Never  Vaping Use   Vaping status: Never Used  Substance Use Topics   Alcohol use: Never   Drug use: Never     Colonoscopy: n/a (age)  PAP: 10/2018, negative  Bone density: n/a (age)   No Known Allergies  Current Outpatient Medications  Medication Sig Dispense Refill   tamoxifen  (NOLVADEX ) 10 MG tablet Take 1 tablet (10 mg total) by mouth daily. 90 tablet 3   No current facility-administered medications for this visit.    OBJECTIVE: Spanish speaker who  appears well  Vitals:   02/21/24 0929  BP: 106/79  Pulse: 67  Resp: 18  Temp: 98.5 F (36.9 C)  SpO2: 100%       Body mass index is 35.95 kg/m.   Wt Readings from Last 3 Encounters:  02/21/24 172 lb (78 kg)  02/21/23 165 lb 3.2 oz (74.9 kg)  08/02/22 161 lb 8 oz (73.3 kg)     ECOG FS:1 - Symptomatic but completely ambulatory  Physical Exam Constitutional:      Appearance: Normal appearance.  HENT:     Head: Normocephalic and atraumatic.  Chest:     Comments: Right breast status postmastectomy.  Left breast normal to inspection and palpation.  No regional adenopathy.   Musculoskeletal:     Cervical back: Normal range of motion and neck supple. No rigidity.  Lymphadenopathy:     Cervical: No cervical adenopathy.  Neurological:     Mental Status: She is alert.       LAB RESULTS:  CMP     Component Value Date/Time   NA 141 02/21/2023 1331   NA 138 12/15/2019 1543   K 4.1 02/21/2023 1331   CL 108 02/21/2023 1331   CO2 28 02/21/2023 1331   GLUCOSE  112 (H) 02/21/2023 1331   BUN 13 02/21/2023 1331   BUN 10 12/15/2019 1543   CREATININE 0.70 02/21/2023 1331   CALCIUM  9.3 02/21/2023 1331   PROT 7.0 02/21/2023 1331   PROT 7.3 12/15/2019 1543   ALBUMIN 4.3 02/21/2023 1331   ALBUMIN 4.5 12/15/2019 1543   AST 66 (H) 02/21/2023 1331   ALT 58 (H) 02/21/2023 1331   ALKPHOS 48 02/21/2023 1331   BILITOT 0.3 02/21/2023 1331   GFRNONAA >60 02/21/2023 1331   GFRAA >60 03/28/2020 0813   GFRAA >60 02/22/2020 1353    No results found for: TOTALPROTELP, ALBUMINELP, A1GS, A2GS, BETS, BETA2SER, GAMS, MSPIKE, SPEI  Lab Results  Component Value Date   WBC 6.1 02/21/2023   NEUTROABS 3.0 02/21/2023   HGB 13.4 02/21/2023   HCT 39.4 02/21/2023   MCV 88.1 02/21/2023   PLT 365 02/21/2023    No results found for: LABCA2  No components found for: OJARJW874  No results for input(s): INR in the last 168 hours.  No results found for: LABCA2  No  results found for: CAN199  Lab Results  Component Value Date   CAN125 9.1 11/06/2021    No results found for: CAN153  No results found for: CA2729  No components found for: HGQUANT  No results found for: CEA1, CEA / No results found for: CEA1, CEA   No results found for: AFPTUMOR  No results found for: CHROMOGRNA  No results found for: KPAFRELGTCHN, LAMBDASER, KAPLAMBRATIO (kappa/lambda light chains)  Lab Results  Component Value Date   HGBA 97.0 10/10/2018   (Hemoglobinopathy evaluation)   No results found for: LDH  Lab Results  Component Value Date   IRON 42 08/29/2021   TIBC 375.2 08/29/2021   IRONPCTSAT 11.2 (L) 08/29/2021   (Iron and TIBC)  Lab Results  Component Value Date   FERRITIN 86.4 08/29/2021    Urinalysis    Component Value Date/Time   COLORURINE YELLOW 03/01/2021 1020   APPEARANCEUR CLEAR 03/01/2021 1020   LABSPEC 1.005 03/01/2021 1020   PHURINE 7.0 03/01/2021 1020   GLUCOSEU NEGATIVE 03/01/2021 1020   HGBUR SMALL (A) 03/01/2021 1020   BILIRUBINUR NEGATIVE 03/01/2021 1020   KETONESUR NEGATIVE 03/01/2021 1020   PROTEINUR NEGATIVE 03/01/2021 1020   NITRITE NEGATIVE 03/01/2021 1020   LEUKOCYTESUR NEGATIVE 03/01/2021 1020    STUDIES: No results found.   ELIGIBLE FOR AVAILABLE RESEARCH PROTOCOL: AET  ASSESSMENT:   48 y.o. [place] woman status post right breast upper outer quadrant biopsy 02/04/2020 for a clinical T2 N0, stage IB invasive ductal carcinoma, grade 2, triple positive, with an MIB-1 of 20%  (1) status post right mastectomy and sentinel lymph node sampling 03/07/2020 for a pT2 pN1, stage IB invasive ductal carcinoma, grade 2, with negative margins.  (a) a total of 4 lymph nodes were removed, one positive  (2) adjuvant chemo immunotherapy will consist of carboplatin , docetaxel , trastuzumab  and pertuzumab  every 21 days x 6 starting 03/29/2020, completed 07/18/2020  (a) pertuzumab  discontinued  after cycle 2 secondary to diarrhea  (3) trastuzumab  continuing to complete a year (last dose 03/21/2021)  (a) echo 03/18/2020 shows an ejection fraction in the 60-65% range  (b) echo 06/29/2020 showed an EF in the 65-70% range  (c) echo 09/28/2020 showed an EF of 55-60%  (d) echo 01/02/2021 shows an ejection fraction in the 60-65% range  (e) echo 03/27/2021  (4) adjuvant radiation 08/17/2020 through 10/03/2020 Site Technique Total Dose (Gy) Dose per Fx (Gy) Completed Fx Beam Energies  Chest  Wall, Right: CW_Rt 3D 50.4/50.4 1.8 28/28 6X, 10X  Chest Wall, Right: CW_Rt_SCLV 3D 50.4/50.4 1.8 28/28 6X, 10X  Chest Wall, Right: CW_Rt_Bst Electron 10/10 2 5/5 6E   (5) genetics testing 03/02/2020 through the [facility] Common Hereditary Cancers Panel. . The Common Hereditary Cancers Panel found no deleterious mutations in APC, ATM, AXIN2, BARD1, BMPR1A, BRCA1, BRCA2, BRIP1, CDH1, CDK4, CDKN2A (p14ARF), CDKN2A (p16INK4a), CHEK2, CTNNA1, DICER1, EPCAM (Deletion/duplication testing only), GREM1 (promoter region deletion/duplication testing only), KIT, MEN1, MLH1, MSH2, MSH3, MSH6, MUTYH, NBN, NF1, NHTL1, PALB2, PDGFRA, PMS2, POLD1, POLE, PTEN, RAD50, RAD51C, RAD51D, RNF43, SDHB, SDHC, SDHD, SMAD4, SMARCA4. STK11, TP53, TSC1, TSC2, and VHL.  The following genes were evaluated for sequence changes only: SDHA and HOXB13 c.251G>A variant only.   (a)  a Variant of uncertain significance (VUS) in DICER1 at c.3380T>G (p.Ile1127Ser) was noted  (6) tamoxifen  started 02/12/2020 in anticipation of possible surgical delays, held during chemotherapy, resumed February 2022  (7) restaging studies:  (A) head CT with and without contrast 01/30/2021 showed no metastatic disease  (B) chest CT 01/30/2021 shows no evidence of metastatic disease.  There is hepatic steatosis, radiation changes right lung, scattered right upper lobe nodules all less than 0.5 cm  (C) MRI of the total spine 01/27/2021 shows no evidence of metastatic  disease.  There is L5 pars defect and mild degenerative disc disease   PLAN:  Assessment and Plan Assessment & Plan History of malignant neoplasm of upper-outer quadrant of right breast, on adjuvant tamoxifen  therapy Breast cancer in the upper-outer quadrant of the right breast. Discontinued tamoxifen  three months ago due to vaginal discharge. Premenopausal and menstruating, limiting alternative medication options. - Prescribed tamoxifen  10 mg tablets, instruct to take half a tablet (5 mg) daily for one year. - Order mammogram at the breast center. - Order routine blood work and liver enzyme tests.  Coccydynia Coccydynia likely due to prolonged sitting and possibly related to post-pregnancy mechanical changes. No imaging needed as fracture is unlikely. - Advise use of a donut cushion to alleviate pressure on the coccyx. - Recommend minimizing sitting time to reduce inflammation.   Total encounter time .*  *Total Encounter Time as defined by the Centers for Medicare and Medicaid Services includes, in addition to the face-to-face time of a patient visit (documented in the note above) non-face-to-face time: obtaining and reviewing outside history, ordering and reviewing medications, tests or procedures, care coordination (communications with other health care professionals or caregivers) and documentation in the medical record.  Patient

## 2024-03-18 ENCOUNTER — Ambulatory Visit: Payer: Self-pay

## 2024-03-18 NOTE — Telephone Encounter (Signed)
 FYI Only or Action Required?: FYI only for provider.  Patient was last seen in primary care on 08/11/2021 by Theotis Haze ORN, NP.  Called Nurse Triage reporting Vaginal Discharge.  Symptoms began several weeks ago.  Interventions attempted: Nothing.  Symptoms are: gradually worsening.  Triage Disposition: See HCP Within 4 Hours (Or PCP Triage)  Patient/caregiver understands and will follow disposition?: Yes  To UC.   Copied from CRM #[id number]. Topic: Clinical - Red Word Triage >> Mar 18, 2024 10:07 AM Mia F wrote: Red Word that prompted transfer to Nurse Triage: Stong pain, vaginal discharge with a smell, and some burning. Has been going on for a few weeks. Seems to be getting worse. Reason for Disposition  [1] Yellow or green vaginal discharge AND [2] fever  Answer Assessment - Initial Assessment Questions 1. DISCHARGE: Describe the discharge. (e.g., white, yellow, green, gray, foamy, cottage cheese-like)     Clear with white 2. ODOR: Is there a bad odor?     yes 3. ONSET: When did the discharge begin?     Few weeks ago since she had chemo 4. RASH: Is there a rash in the genital area? If Yes, ask: Describe it. (e.g., redness, blisters, sores, bumps)     no 5. ABDOMEN PAIN: Are you having any abdomen pain? If Yes, ask: What does it feel like?  (e.g., crampy, dull, intermittent, constant)      Swollen but denies pain 6. ABDOMEN PAIN SEVERITY: If present, ask: How bad is it? (e.g., Scale 1-10; mild, moderate, or severe)     unknown 7. CAUSE: What do you think is causing the discharge? Have you had the same problem before? What happened then?     Yes, back in [place] 8. OTHER SYMPTOMS: Do you have any other symptoms? (e.g., fever, itching, urination pain, vaginal bleeding, vaginal foreign body)     Burning sensation 9. PREGNANCY: Is there any chance you are pregnant? When was your last menstrual period?     na  Protocols used: Vaginal  Discharge-A-AH

## 2024-03-19 ENCOUNTER — Other Ambulatory Visit: Payer: Self-pay

## 2024-03-19 ENCOUNTER — Ambulatory Visit

## 2024-03-19 ENCOUNTER — Encounter (HOSPITAL_COMMUNITY): Payer: Self-pay

## 2024-03-19 ENCOUNTER — Other Ambulatory Visit (HOSPITAL_COMMUNITY): Payer: Self-pay

## 2024-03-19 ENCOUNTER — Emergency Department (HOSPITAL_COMMUNITY)
Admission: EM | Admit: 2024-03-19 | Discharge: 2024-03-19 | Disposition: A | Discharge status: Home / Self Care | Attending: Emergency Medicine | Admitting: Emergency Medicine

## 2024-03-19 DIAGNOSIS — R300 Dysuria: Secondary | ICD-10-CM | POA: Insufficient documentation

## 2024-03-19 DIAGNOSIS — N898 Other specified noninflammatory disorders of vagina: Secondary | ICD-10-CM | POA: Insufficient documentation

## 2024-03-19 DIAGNOSIS — Z5329 Procedure and treatment not carried out because of patient's decision for other reasons: Secondary | ICD-10-CM | POA: Insufficient documentation

## 2024-03-19 DIAGNOSIS — Z853 Personal history of malignant neoplasm of breast: Secondary | ICD-10-CM | POA: Diagnosis not present

## 2024-03-19 LAB — URINALYSIS, ROUTINE W REFLEX MICROSCOPIC
Bacteria, UA: NONE SEEN
Bilirubin Urine: NEGATIVE
Glucose, UA: NEGATIVE mg/dL
Ketones, ur: NEGATIVE mg/dL
Nitrite: NEGATIVE
Protein, ur: NEGATIVE mg/dL
Specific Gravity, Urine: 1.005 (ref 1.005–1.030)
pH: 7 (ref 5.0–8.0)

## 2024-03-19 LAB — CBC
HCT: 39.8 % (ref 36.0–46.0)
Hemoglobin: 13.7 g/dL (ref 12.0–15.0)
MCH: 30.9 pg (ref 26.0–34.0)
MCHC: 34.4 g/dL (ref 30.0–36.0)
MCV: 89.8 fL (ref 80.0–100.0)
Platelets: 354 K/uL (ref 150–400)
RBC: 4.43 MIL/uL (ref 3.87–5.11)
RDW: 12.3 % (ref 11.5–15.5)
WBC: 4.8 K/uL (ref 4.0–10.5)
nRBC: 0 % (ref 0.0–0.2)

## 2024-03-19 LAB — WET PREP, GENITAL
Clue Cells Wet Prep HPF POC: NONE SEEN
Sperm: NONE SEEN
Trich, Wet Prep: NONE SEEN
WBC, Wet Prep HPF POC: >=10 — AB (ref <10)
Yeast Wet Prep HPF POC: NONE SEEN

## 2024-03-19 LAB — HCG, SERUM, QUALITATIVE: Preg, Serum: NEGATIVE

## 2024-03-19 MED ORDER — CEFADROXIL 500 MG PO CAPS
500.0000 mg | ORAL_CAPSULE | Freq: Two times a day (BID) | ORAL | 0 refills | Status: AC
Start: 2024-03-19 — End: 2024-03-26
  Filled 2024-03-19: qty 14, 7d supply, fill #0

## 2024-03-19 MED ORDER — PHENAZOPYRIDINE HCL 95 MG PO TABS
95.0000 mg | ORAL_TABLET | Freq: Three times a day (TID) | ORAL | 0 refills | Status: AC | PRN
  Filled 2024-03-19: qty 10, 4d supply, fill #0

## 2024-03-19 NOTE — Discharge Instructions (Signed)
 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  Saliste de urgencias antes de que terminaran tus estudios.  Te estoy tratando como si tuvieras una infeccin del tracto urinario y te hubieran hecho un cultivo de comoros.  Asegrate de hacer un seguimiento con tu mdico de cabecera.

## 2024-03-19 NOTE — ED Triage Notes (Signed)
 Spanish speaking iPad interpreter used.   PT ambulatory to triage with complaints of vaginal discomfort, with foul smelling discharge for approx 3 weeks. Pt has not had a GYN exam for approx 2 years. PT states that she attempted to be seen by her PCP, but there were no available appointments. PT concerned due to hx of breast cancer.

## 2024-03-19 NOTE — ED Notes (Signed)
 Using interpreter, pt reports lower back pain that radiates to abd. Urine burning and discomfort with foul odor.

## 2024-03-19 NOTE — ED Provider Notes (Signed)
 [place] EMERGENCY DEPARTMENT AT [facility] Provider Note   CSN: [id number] Arrival date & time: 03/19/24  9162    Patient presents with: Vaginal Pain and Vaginal Discharge   Jill [name] is a 48 y.o. female history of breast cancer- Neg for BRCA1/2 per patient here for evaluation of lower abdominal pain.  She has had suprapubic pain, dysuria and vaginal discharge over the last 3 weeks.  Notes she has had some malodorous discharge as well.  States she has had some intermittent discharge since having chemotherapy for breast cancer many years ago.  She is concerned as she is behind has not had a Pap smear for 2 years.  She attempted to be seen by her PCP however no appointments available.  No fever, nausea vomiting, chest pain, shortness of breath, night sweats, unintentional weight loss.  No vaginal discharge.  Not sexually active.  No concern for pregnancy or STI.  States she has had recurrent UTIs as well as vaginitis after her breast cancer diagnosis.  She admits to mild urinary frequency over the last few weeks.  She has not taken any medicines at home.  She has no focal right lower quadrant, left lower quadrant pain.  Medical Spanish interpreter used   HPI     Prior to Admission medications   Medication Sig Start Date End Date Taking? Authorizing Provider  cefadroxil  (DURICEF) 500 MG capsule Take 1 capsule (500 mg total) by mouth 2 (two) times daily for 7 days. 03/19/24 03/26/24 Yes Ona Rathert A, PA-C  phenazopyridine  (PYRIDIUM ) 95 MG tablet Take 1 tablet (95 mg total) by mouth 3 (three) times daily as needed for pain. 03/19/24  Yes Nakiah Osgood A, PA-C  tamoxifen  (NOLVADEX ) 10 MG tablet Take 1 tablet (10 mg total) by mouth daily. 02/21/23   Iruku, Praveena, MD    Allergies: Patient has no known allergies.    Review of Systems  Constitutional: Negative.   HENT: Negative.    Respiratory: Negative.    Cardiovascular: Negative.    Gastrointestinal: Negative.   Genitourinary:  Positive for dysuria, frequency, urgency and vaginal discharge. Negative for flank pain, genital sores, menstrual problem, pelvic pain, vaginal bleeding and vaginal pain.  Musculoskeletal: Negative.   Skin: Negative.   Neurological: Negative.   All other systems reviewed and are negative.   Updated Vital Signs BP 123/76   Pulse 62   Temp 98.6 F (37 C)   Resp 16   Ht 4' 10 (1.473 m)   Wt 78 kg   LMP 02/20/2024 (Exact Date)   SpO2 100%   BMI 35.94 kg/m   Physical Exam Vitals and nursing note reviewed.  Constitutional:      General: She is not in acute distress.    Appearance: She is well-developed. She is not ill-appearing, toxic-appearing or diaphoretic.  HENT:     Head: Atraumatic.  Eyes:     Pupils: Pupils are equal, round, and reactive to light.  Cardiovascular:     Rate and Rhythm: Normal rate.     Pulses: Normal pulses.     Heart sounds: Normal heart sounds.  Pulmonary:     Effort: Pulmonary effort is normal. No respiratory distress.     Breath sounds: Normal breath sounds.  Abdominal:     General: Bowel sounds are normal. There is no distension.     Palpations: Abdomen is soft. There is no mass.     Tenderness: There is abdominal tenderness. There is no right CVA  tenderness, left CVA tenderness, guarding or rebound. Negative signs include Murphy's sign and McBurney's sign.     Hernia: No hernia is present.     Comments: Mild suprapubic tenderness.  No focal right lower quadrant, left lower quadrant pain.  Negative CVA tap bilaterally  Genitourinary:    Comments: Normal appearing external female genitalia without rashes or lesions, normal vaginal epithelium. Normal appearing cervix, thin clear discharge. No petechiae. Cervical os is closed. There is no bleeding noted at the os No Odor. Bimanual: No CMT,  nontender.  No palpable adnexal masses or tenderness. Uterus midline and not fixed. Rectovaginal exam was deferred.   No cystocele or rectocele noted. No pelvic lymphadenopathy noted. Wet prep was obtained.  Cultures for gonorrhea and chlamydia collected. Exam performed with chaperone in room Sims Muzzy, RN.)   Musculoskeletal:        General: Normal range of motion.     Cervical back: Normal range of motion.     Comments: Compartments soft, full range of motion  Skin:    General: Skin is warm and dry.     Capillary Refill: Capillary refill takes less than 2 seconds.     Comments: No obvious rashes or lesions on exposed skin  Neurological:     General: No focal deficit present.     Mental Status: She is alert.     Sensory: Sensation is intact.     Motor: Motor function is intact.     Gait: Gait is intact.  Psychiatric:        Mood and Affect: Mood normal.     (all labs ordered are listed, but only abnormal results are displayed) Labs Reviewed  WET PREP, GENITAL - Abnormal; Notable for the following components:      Result Value   WBC, Wet Prep HPF POC >=10 (*)    All other components within normal limits  URINALYSIS, ROUTINE W REFLEX MICROSCOPIC - Abnormal; Notable for the following components:   APPearance HAZY (*)    Hgb urine dipstick SMALL (*)    Leukocytes,Ua MODERATE (*)    Crystals PRESENT (*)    All other components within normal limits  URINE CULTURE  CBC  HCG, SERUM, QUALITATIVE  GC/CHLAMYDIA PROBE AMP ([place]) NOT AT [facility]    EKG: None  Radiology: No results found.   Procedures   Medications Ordered in the ED - No data to display  48 year old here for evaluation of vaginal discharge, suprapubic pain, dysuria.  She has a history of prior breast cancer.  She is concerned that she could have something else going on given she is past due for her Pap smear.  She tried to get her PCP however was not able to.  Symptoms been ongoing for 3 weeks.  She is afebrile, nonseptic, non-ill-appearing.  Labs obtained from triage, personally viewed and interpreted:  CBC without  leukocytosis Pregnancy test negative UA negative-moderate leuks--sent for culture  Patient states she does not want to wait for results.  States she has to be at work and she will get fired if she does not show up.  Patient understands risk of leaving prior to workup completion.  She voiced understand risk versus benefit.  She will leaving against medical advice.  All questions answered.  Medical Spanish interpreter used and family at bedside.  Given having urinary symptoms and moderate leuks will treat for UTI until urine culture results.  Her wet prep is pending.  She says she will follow-up with her PCP  for her results.   Addendum: Patient's lipase, metabolic panel clotted.  Wet prep showed greater than 10 WBC, negative for yeast, trichomoniasis, BV  We discussed the nature and purpose, risks and benefits, as well as, the alternatives of treatment. Time was given to allow the opportunity to ask questions and consider their options, and after the discussion, the patient decided to refuse the offerred treatment. The patient was informed that refusal could lead to, but was not limited to, death, permanent disability, or severe pain. If present, I asked the relatives or significant others to dissuade them without success. Prior to refusing, I determined that the patient had the capacity to make their decision and understood the consequences of that decision. After refusal, I made every reasonable opportunity to treat them to the best of my ability.  The patient was notified that they may return to the emergency department at any time for further treatment.                                     Medical Decision Making Amount and/or Complexity of Data Reviewed Independent Historian:     Details: Family in room, medical spanish interpretor External Data Reviewed: labs, radiology and notes. Labs: ordered. Decision-making details documented in ED Course.  Risk OTC drugs. Prescription drug  management. Diagnosis or treatment significantly limited by social determinants of health.       Final diagnoses:  Dysuria  Vaginal discharge  Left against medical advice    ED Discharge Orders          Ordered    cefadroxil  (DURICEF) 500 MG capsule  2 times daily        03/19/24 1242    phenazopyridine  (PYRIDIUM ) 95 MG tablet  3 times daily PRN        03/19/24 1242               Srihan Brutus A, PA-C 03/19/24 2149    Armenta Canning, MD 03/20/24 [phone]

## 2024-03-20 LAB — GC/CHLAMYDIA PROBE AMP (~~LOC~~) NOT AT ARMC
Chlamydia: NEGATIVE
Comment: NEGATIVE
Comment: NORMAL
Neisseria Gonorrhea: NEGATIVE

## 2024-03-21 LAB — URINE CULTURE: Culture: <10000 — AB

## 2024-04-06 ENCOUNTER — Encounter (HOSPITAL_BASED_OUTPATIENT_CLINIC_OR_DEPARTMENT_OTHER): Payer: Self-pay | Admitting: Radiology

## 2024-04-06 ENCOUNTER — Ambulatory Visit (HOSPITAL_BASED_OUTPATIENT_CLINIC_OR_DEPARTMENT_OTHER)
Admission: RE | Admit: 2024-04-06 | Discharge: 2024-04-06 | Disposition: A | Discharge status: Home / Self Care | Source: Ambulatory Visit | Attending: Hematology and Oncology | Admitting: Hematology and Oncology

## 2024-04-06 DIAGNOSIS — Z853 Personal history of malignant neoplasm of breast: Secondary | ICD-10-CM | POA: Diagnosis not present

## 2024-04-06 DIAGNOSIS — Z170 Estrogen receptor positive status [ER+]: Secondary | ICD-10-CM | POA: Diagnosis present

## 2024-04-06 DIAGNOSIS — Z1231 Encounter for screening mammogram for malignant neoplasm of breast: Secondary | ICD-10-CM | POA: Insufficient documentation

## 2024-04-06 DIAGNOSIS — C50411 Malignant neoplasm of upper-outer quadrant of right female breast: Secondary | ICD-10-CM | POA: Diagnosis present

## 2025-02-19 ENCOUNTER — Ambulatory Visit: Admitting: Hematology and Oncology
